# Patient Record
Sex: Female | Born: 1950 | Race: White | Hispanic: No | Marital: Single | Smoking: Former smoker
Health system: Southern US, Community
[De-identification: ages and names within clinical notes are randomized; demographics above are authoritative.]

## PROBLEM LIST (undated history)

## (undated) ENCOUNTER — Emergency Department (HOSPITAL_COMMUNITY): Discharge status: Against Medical Advice | Payer: 59

## (undated) DIAGNOSIS — N6019 Diffuse cystic mastopathy of unspecified breast: Secondary | ICD-10-CM

## (undated) DIAGNOSIS — F419 Anxiety disorder, unspecified: Secondary | ICD-10-CM

## (undated) DIAGNOSIS — M8580 Other specified disorders of bone density and structure, unspecified site: Secondary | ICD-10-CM

## (undated) DIAGNOSIS — D219 Benign neoplasm of connective and other soft tissue, unspecified: Secondary | ICD-10-CM

## (undated) DIAGNOSIS — E7800 Pure hypercholesterolemia, unspecified: Secondary | ICD-10-CM

## (undated) DIAGNOSIS — E039 Hypothyroidism, unspecified: Secondary | ICD-10-CM

## (undated) DIAGNOSIS — K219 Gastro-esophageal reflux disease without esophagitis: Secondary | ICD-10-CM

## (undated) DIAGNOSIS — K449 Diaphragmatic hernia without obstruction or gangrene: Secondary | ICD-10-CM

## (undated) DIAGNOSIS — N809 Endometriosis, unspecified: Secondary | ICD-10-CM

## (undated) DIAGNOSIS — N3941 Urge incontinence: Secondary | ICD-10-CM

## (undated) DIAGNOSIS — I25810 Atherosclerosis of coronary artery bypass graft(s) without angina pectoris: Secondary | ICD-10-CM

## (undated) DIAGNOSIS — Z8709 Personal history of other diseases of the respiratory system: Secondary | ICD-10-CM

## (undated) HISTORY — DX: Diffuse cystic mastopathy of unspecified breast: N60.19

## (undated) HISTORY — DX: Pure hypercholesterolemia, unspecified: E78.00

## (undated) HISTORY — PX: ABDOMINAL HYSTERECTOMY: SHX81

## (undated) HISTORY — DX: Atherosclerosis of coronary artery bypass graft(s) without angina pectoris: I25.810

## (undated) HISTORY — PX: COLONOSCOPY: SHX174

## (undated) HISTORY — DX: Benign neoplasm of connective and other soft tissue, unspecified: D21.9

## (undated) HISTORY — DX: Hypothyroidism, unspecified: E03.9

## (undated) HISTORY — DX: Other specified disorders of bone density and structure, unspecified site: M85.80

## (undated) HISTORY — DX: Endometriosis, unspecified: N80.9

## (undated) HISTORY — DX: Diaphragmatic hernia without obstruction or gangrene: K44.9

## (undated) HISTORY — DX: Urge incontinence: N39.41

---

## 1976-09-26 HISTORY — PX: WISDOM TOOTH EXTRACTION: SHX21

## 1978-09-26 HISTORY — PX: BREAST BIOPSY: SHX20

## 1990-09-26 HISTORY — PX: FOOT SURGERY: SHX648

## 2002-09-26 HISTORY — PX: BLADDER SUSPENSION: SHX72

## 2002-09-26 HISTORY — PX: COMBINED HYSTERECTOMY VAGINAL / OOPHORECTOMY / A&P REPAIR: SUR294

## 2005-09-26 DIAGNOSIS — E7800 Pure hypercholesterolemia, unspecified: Secondary | ICD-10-CM

## 2005-09-26 HISTORY — DX: Pure hypercholesterolemia, unspecified: E78.00

## 2012-08-05 ENCOUNTER — Encounter (HOSPITAL_COMMUNITY): Payer: Self-pay | Admitting: *Deleted

## 2012-08-05 ENCOUNTER — Emergency Department (HOSPITAL_COMMUNITY)
Admission: EM | Admit: 2012-08-05 | Discharge: 2012-08-05 | Disposition: A | Discharge status: Home / Self Care | Payer: 59 | Source: Home / Self Care | Attending: Emergency Medicine | Admitting: Emergency Medicine

## 2012-08-05 ENCOUNTER — Emergency Department (INDEPENDENT_AMBULATORY_CARE_PROVIDER_SITE_OTHER): Payer: 59

## 2012-08-05 DIAGNOSIS — S92309A Fracture of unspecified metatarsal bone(s), unspecified foot, initial encounter for closed fracture: Secondary | ICD-10-CM

## 2012-08-05 DIAGNOSIS — S92301A Fracture of unspecified metatarsal bone(s), right foot, initial encounter for closed fracture: Secondary | ICD-10-CM

## 2012-08-05 MED ORDER — HYDROCODONE-IBUPROFEN 7.5-200 MG PO TABS: 1.0000 | ORAL_TABLET | Freq: Three times a day (TID) | ORAL | Status: DC | PRN

## 2012-08-05 NOTE — ED Provider Notes (Signed)
History     CSN: [id number]  Arrival date & time 08/05/12  1627   First MD Initiated Contact with Patient 08/05/12 1640      Chief Complaint  Patient presents with  . Foot Injury    (Consider location/radiation/quality/duration/timing/severity/associated sxs/prior treatment) HPI Comments: Pt reports stepping off the sidewalk and losing footing, injuring right foot around 1145 this morning, hurts to walk on her right foot. There is an area of swelling in the lateral aspect of it. Patient denies any weakness, numbness, tingling sensation. She describes a conversion of her foot as she was walking on the sidewalk.    Patient is a 61 y.o. female presenting with foot injury. The history is provided by the patient.  Foot Injury  The incident occurred 1 to 2 hours ago. The incident occurred at home. The injury mechanism was torsion. The pain is present in the right foot. The pain is moderate. The pain has been constant since onset. Associated symptoms include inability to bear weight. Pertinent negatives include no numbness, no loss of motion, no muscle weakness, no loss of sensation and no tingling.    Past Medical History  Diagnosis Date  . Depression     Past Surgical History  Procedure Date  . Foot surgery   . Abdominal hysterectomy     Family History  Problem Relation Age of Onset  . Family history unknown: Yes    History  Substance Use Topics  . Smoking status: Never Smoker   . Smokeless tobacco: Not on file  . Alcohol Use: Yes     Comment: occasinal    OB History    Grav Para Term Preterm Abortions TAB SAB Ect Mult Living                  Review of Systems  Constitutional: Negative for fever, activity change and appetite change.  Musculoskeletal: Positive for joint swelling.  Neurological: Negative for tingling, weakness and numbness.    Allergies  Cephalosporins  Home Medications   Current Outpatient Rx  Name  Route  Sig  Dispense  Refill  .  FLUOXETINE HCL 10 MG PO CAPS   Oral   Take by mouth daily.         Marland Kitchen HYDROCODONE-IBUPROFEN 7.5-200 MG PO TABS   Oral   Take 1 tablet by mouth every 8 (eight) hours as needed for pain.   20 tablet   0     BP 137/83  Pulse 84  Temp 98.1 F (36.7 C) (Oral)  Resp 17  SpO2 98%  Physical Exam  Constitutional: She appears well-developed and well-nourished.  Musculoskeletal: She exhibits tenderness.       Feet:  Neurological: She is alert.  Skin: No rash noted. No erythema.    ED Course  Procedures (including critical care time)  Labs Reviewed - No data to display Dg Foot Complete Right  08/05/2012  *RADIOLOGY REPORT*  Clinical Data: Fall  RIGHT FOOT COMPLETE - 3+ VIEW  Comparison: None.  Findings: Three views of the right foot submitted.  There is a nondisplaced fracture at the base of the right fifth metatarsal.  IMPRESSION: Fracture at the base of the right fifth metatarsal.   Original Report Authenticated By: Natasha Mead, M.D.      1. Fracture of metatarsal bone of right foot       MDM   Nondisplaced fracture of the base of the right fifth metatarsal. Patient was placed in a Cam Walker and provided crutches  to avoid weight-bearing activities or 7 days until seen by the orthopedic Dr. Have also provided a prescription of Vicoprofen and a work note with restrictions for 7 days. No neural vascular deficits on exam. Uncomplicated fifth metarsal fracture.       Jimmie Molly, MD 08/05/12 [phone]

## 2012-08-05 NOTE — ED Notes (Signed)
Pt reports stepping off the sidewalk and losing footing, injuring right foot around 1145 this morning

## 2012-08-30 ENCOUNTER — Ambulatory Visit
Admission: RE | Admit: 2012-08-30 | Discharge: 2012-08-30 | Disposition: A | Discharge status: Home / Self Care | Payer: 59 | Source: Ambulatory Visit | Attending: Internal Medicine | Admitting: Internal Medicine

## 2012-08-30 ENCOUNTER — Other Ambulatory Visit: Payer: Self-pay | Admitting: Internal Medicine

## 2012-08-30 DIAGNOSIS — R0602 Shortness of breath: Secondary | ICD-10-CM

## 2013-03-20 ENCOUNTER — Emergency Department (HOSPITAL_COMMUNITY)
Admission: EM | Admit: 2013-03-20 | Discharge: 2013-03-20 | Disposition: A | Discharge status: Home / Self Care | Payer: BC Managed Care – PPO | Source: Home / Self Care | Attending: Family Medicine | Admitting: Family Medicine

## 2013-03-20 ENCOUNTER — Encounter (HOSPITAL_COMMUNITY): Payer: Self-pay | Admitting: Emergency Medicine

## 2013-03-20 DIAGNOSIS — M5380 Other specified dorsopathies, site unspecified: Secondary | ICD-10-CM

## 2013-03-20 DIAGNOSIS — M62830 Muscle spasm of back: Secondary | ICD-10-CM

## 2013-03-20 DIAGNOSIS — M545 Low back pain: Secondary | ICD-10-CM

## 2013-03-20 MED ORDER — METHYLPREDNISOLONE ACETATE 80 MG/ML IJ SUSP
INTRAMUSCULAR | Status: AC
  Filled 2013-03-20: qty 1

## 2013-03-20 MED ORDER — KETOROLAC TROMETHAMINE 60 MG/2ML IM SOLN
INTRAMUSCULAR | Status: AC
  Filled 2013-03-20: qty 2

## 2013-03-20 MED ORDER — KETOROLAC TROMETHAMINE 60 MG/2ML IM SOLN
60.0000 mg | Freq: Once | INTRAMUSCULAR | Status: AC
  Administered 2013-03-20: 60 mg via INTRAMUSCULAR

## 2013-03-20 MED ORDER — CYCLOBENZAPRINE HCL 10 MG PO TABS: 10.0000 mg | ORAL_TABLET | Freq: Three times a day (TID) | ORAL | Status: DC | PRN

## 2013-03-20 MED ORDER — NAPROXEN 500 MG PO TABS: 500.0000 mg | ORAL_TABLET | Freq: Two times a day (BID) | ORAL | Status: DC

## 2013-03-20 MED ORDER — METHYLPREDNISOLONE ACETATE 40 MG/ML IJ SUSP
80.0000 mg | Freq: Once | INTRAMUSCULAR | Status: AC
  Administered 2013-03-20: 80 mg via INTRAMUSCULAR

## 2013-03-20 NOTE — ED Notes (Signed)
Reports back pain since Monday evening.  Reports she moved the wrong way playing with grandchildren.  Throughout the evening pain increased, no numbness or tingling.  Patient reports going from sitting to /from standing , pain is intense.  Using steps is very painful

## 2013-03-20 NOTE — ED Notes (Signed)
Instructed to place on gown 

## 2013-03-20 NOTE — ED Provider Notes (Signed)
History    CSN: [id number] Arrival date & time 03/20/13  1027  First MD Initiated Contact with Patient 03/20/13 1043     Chief Complaint  Patient presents with  . Back Pain   (Consider location/radiation/quality/duration/timing/severity/associated sxs/prior Treatment) HPI Comments: 62 year old female presents complaining of bilateral lower back pain that started on Monday 2 days ago. She was playing with her grandchildren when she felt something "catch" in her back. Since then she has pain across her lower back, worse on the right, that goes slightly down her right leg. The pain gets worse with prolonged sitting and a car, change in position, or any bending at the waist. She denies any numbness  or paresthesias into the groin or legs. There is no loss of bowel control. She does have a chronic urge incontinence that has gotten slightly worse since this happened but she attributes this to taking longer to set down onto the toilet as well as having the back "catch" when she tries to sit down which can sometimes cause her to lose control of her bladder. The pain has been getting better somewhat with a hot shower, walking, and lying flat. She denies any other symptoms apart from the back pain. There is no fever, chills, headache, blurry vision, or rash. She does have a history of mild infrequent lower back pain.  Past Medical History  Diagnosis Date  . Depression    Past Surgical History  Procedure Laterality Date  . Foot surgery    . Abdominal hysterectomy     No family history on file. History  Substance Use Topics  . Smoking status: Never Smoker   . Smokeless tobacco: Not on file  . Alcohol Use: Yes     Comment: occasinal   OB History   Grav Para Term Preterm Abortions TAB SAB Ect Mult Living                 Review of Systems  Constitutional: Negative for fever and chills.  Eyes: Negative for visual disturbance.  Respiratory: Negative for cough and shortness of breath.    Cardiovascular: Negative for chest pain, palpitations and leg swelling.  Gastrointestinal: Negative for nausea, vomiting and abdominal pain.  Endocrine: Negative for polydipsia and polyuria.  Genitourinary: Positive for urgency (chronic). Negative for dysuria and frequency.  Musculoskeletal: Positive for back pain. Negative for myalgias and arthralgias.  Skin: Negative for rash.  Neurological: Negative for dizziness, weakness and light-headedness.    Allergies  Cephalosporins  Home Medications   Current Outpatient Rx  Name  Route  Sig  Dispense  Refill  . acetaminophen (TYLENOL) 325 MG tablet   Oral   Take 650 mg by mouth every 6 (six) hours as needed for pain.         Marland Kitchen estradiol (VIVELLE-DOT) 0.1 MG/24HR   Transdermal   Place 1 patch onto the skin 2 (two) times a week.         . Naproxen Sodium (ALEVE PO)   Oral   Take by mouth.         . cyclobenzaprine (FLEXERIL) 10 MG tablet   Oral   Take 1 tablet (10 mg total) by mouth 3 (three) times daily as needed for muscle spasms.   30 tablet   0     Dispense as written.   Marland Kitchen FLUoxetine (PROZAC) 10 MG capsule   Oral   Take by mouth daily.         Marland Kitchen HYDROcodone-ibuprofen (VICOPROFEN) 7.5-200 MG per  tablet   Oral   Take 1 tablet by mouth every 8 (eight) hours as needed for pain.   20 tablet   0   . naproxen (NAPROSYN) 500 MG tablet   Oral   Take 1 tablet (500 mg total) by mouth 2 (two) times daily.   60 tablet   0    BP 127/81  Pulse 72  Temp(Src) 98.2 F (36.8 C) (Oral)  Resp 16  SpO2 100% Physical Exam  Nursing note and vitals reviewed. Constitutional: She is oriented to person, place, and time. Vital signs are normal. She appears well-developed and well-nourished. No distress.  HENT:  Head: Atraumatic.  Eyes: EOM are normal. Pupils are equal, round, and reactive to light.  Cardiovascular: Normal rate, regular rhythm and normal heart sounds.  Exam reveals no gallop and no friction rub.   No murmur  heard. Pulmonary/Chest: Effort normal and breath sounds normal. No respiratory distress. She has no wheezes. She has no rales.  Abdominal: Soft. There is no tenderness.  Musculoskeletal:       Lumbar back: She exhibits decreased range of motion, tenderness, pain and spasm. She exhibits no bony tenderness, no edema and no deformity.  Palpation of the lower lumbar spinous processes causes some mild pain to radiate through the right buttock. The paraspinous musculature is very tight and only mildly tender. Overall, palpation of the lumbar spine and paraspinous musculature does not  increase the pain to the level that she feels when it "catches"  Neurological: She is alert and oriented to person, place, and time. She has normal strength.  Skin: Skin is warm and dry. She is not diaphoretic.  Psychiatric: She has a normal mood and affect. Her behavior is normal. Judgment normal.    ED Course  Procedures (including critical care time) Labs Reviewed - No data to display No results found. 1. Back spasm   2. Lumbago     MDM  The history and physical exam are appropriate for a back spasm. I've strongly expresses patient that if she does start to develop any loss of control of her bowels or bladder, she will return immediately to the emergency department. Otherwise, she will use the prescribed medications as well as staying active and moist heat. She will return if this does not improve.    Meds ordered this encounter  Medications                       . methylPREDNISolone acetate (DEPO-MEDROL) injection 80 mg    Sig:   . ketorolac (TORADOL) injection 60 mg    Sig:   . naproxen (NAPROSYN) 500 MG tablet    Sig: Take 1 tablet (500 mg total) by mouth 2 (two) times daily.    Dispense:  60 tablet    Refill:  0  . cyclobenzaprine (FLEXERIL) 10 MG tablet    Sig: Take 1 tablet (10 mg total) by mouth 3 (three) times daily as needed for muscle spasms.    Dispense:  30 tablet    Refill:  0      Graylon Good, PA-C 03/20/13 1145

## 2013-03-20 NOTE — ED Provider Notes (Signed)
Medical screening examination/treatment/procedure(s) were performed by non-physician practitioner and as supervising physician I was immediately available for consultation/collaboration.   [facility]; MD  Sharin Grave, MD 03/20/13 [phone]

## 2013-04-16 ENCOUNTER — Institutional Professional Consult (permissible substitution): Payer: Self-pay | Admitting: Medical

## 2013-06-05 ENCOUNTER — Ambulatory Visit (INDEPENDENT_AMBULATORY_CARE_PROVIDER_SITE_OTHER): Payer: BC Managed Care – PPO | Admitting: Gynecology

## 2013-06-05 ENCOUNTER — Encounter: Payer: Self-pay | Admitting: Gynecology

## 2013-06-05 VITALS — BP 124/74 | HR 62 | Resp 14 | Ht 64.0 in | Wt 148.0 lb

## 2013-06-05 DIAGNOSIS — Z780 Asymptomatic menopausal state: Secondary | ICD-10-CM | POA: Insufficient documentation

## 2013-06-05 DIAGNOSIS — Z79890 Hormone replacement therapy (postmenopausal): Secondary | ICD-10-CM

## 2013-06-05 DIAGNOSIS — N3941 Urge incontinence: Secondary | ICD-10-CM

## 2013-06-05 DIAGNOSIS — Z01419 Encounter for gynecological examination (general) (routine) without abnormal findings: Secondary | ICD-10-CM

## 2013-06-05 DIAGNOSIS — R159 Full incontinence of feces: Secondary | ICD-10-CM

## 2013-06-05 HISTORY — DX: Urge incontinence: N39.41

## 2013-06-05 HISTORY — DX: Asymptomatic menopausal state: Z78.0

## 2013-06-05 MED ORDER — FESOTERODINE FUMARATE ER 4 MG PO TB24: 4.0000 mg | ORAL_TABLET | Freq: Every day | ORAL | Status: DC

## 2013-06-05 MED ORDER — ESTRADIOL 0.1 MG/24HR TD PTTW: 1.0000 | MEDICATED_PATCH | TRANSDERMAL | Status: DC

## 2013-06-05 MED ORDER — FLUOXETINE HCL 40 MG PO CAPS: 40.0000 mg | ORAL_CAPSULE | Freq: Every day | ORAL | Status: DC

## 2013-06-05 NOTE — Patient Instructions (Addendum)

## 2013-06-05 NOTE — Progress Notes (Signed)
62 y.o. Single G5P2L2 Caucasian female   No obstetric history on file. here for annual exam. Pt reports menses are absent.  She does report hot flashes, does have night sweats, does have vaginal dryness.  She is not using lubricants.  She does not report post-menopasual bleeding.  Pt had a sling done with her TVH in 2004, pt reports initially she was continent, but now has been leaking urine over the last 71m, mostly described as urgency-will loose variable volumes of urine.  Pt drinks carbonated beverages-3-4 cokes/d but no coffee or tea.  Pt also reports occasional loss of stool with valsalva and sometimes no sensation at all, small amounts, formed stool not diarrhea.  Patient's last menstrual period was 09/27/2003.          Sexually active: no  The current method of family planning is status post hysterectomy.    Exercising: yes  walking 2x/wk Last pap: 2012/2013- not sure if pap was done Abnormal PAP: no Mammogram: March/April  2013- Normal BSE: no Colonoscopy: August 2007/2008-  2 polyps  DEXA: cannot remember  Alcohol: 1-2 drinks/wk Tobacco: no  Hgb: PCP ; Urine: CP   No health maintenance topics applied.  Family History  Problem Relation Age of Onset  . Thyroid disease Maternal Grandmother     Goiter  . Cancer Paternal Grandmother     Colon   . Cancer Paternal Grandfather     Lung  . Thyroid disease Daughter     Hypothyroid    There are no active problems to display for this patient.   Past Medical History  Diagnosis Date  . Endometriosis   . Fibroid     Past Surgical History  Procedure Laterality Date  . Foot surgery    . Abdominal hysterectomy  2005  . Wisdom tooth extraction  1978    Allergies: Cephalosporins  Current Outpatient Prescriptions  Medication Sig Dispense Refill  . estradiol (VIVELLE-DOT) 0.1 MG/24HR Place 1 patch onto the skin 2 (two) times a week.      Marland Kitchen FLUoxetine (PROZAC) 10 MG capsule Take 20 mg by mouth daily.        No current  facility-administered medications for this visit.    ROS: Pertinent items are noted in HPI.  Exam:    BP 124/74  Pulse 62  Resp 14  Ht 5\' 4"  (1.626 m)  Wt 148 lb (67.132 kg)  BMI 25.39 kg/m2  LMP 09/27/2003 Weight change: @WEIGHTCHANGE @ Last 3 height recordings:  Ht Readings from Last 3 Encounters:  06/05/13 5\' 4"  (1.626 m)   General appearance: alert, cooperative and appears stated age Head: Normocephalic, without obvious abnormality, atraumatic Neck: no adenopathy, no carotid bruit, no JVD, supple, symmetrical, trachea midline and thyroid not enlarged, symmetric, no tenderness/mass/nodules Lungs: clear to auscultation bilaterally Breasts: normal appearance, no masses or tenderness Heart: regular rate and rhythm, S1, S2 normal, no murmur, click, rub or gallop Abdomen: soft, non-tender; bowel sounds normal; no masses,  no organomegaly Extremities: extremities normal, atraumatic, no cyanosis or edema Skin: Skin color, texture, turgor normal. No rashes or lesions Lymph nodes: Cervical, supraclavicular, and axillary nodes normal. no inguinal nodes palpated Neurologic: Grossly normal   Pelvic: External genitalia:  no lesions              Urethra: normal appearing urethra with no masses, tenderness or lesions              Bartholins and Skenes: Bartholin's, Urethra, Skene's normal  Vagina: normal appearing vagina with normal color and discharge, no lesions, well supported, sling not palpated, split speculum exam reveals a high cystocele with min descent, poor rectovaginal tone              Cervix: absent              Pap taken: no        Bimanual Exam:  Uterus:  absent                                      Adnexa:    no masses                                      Rectovaginal: rectocele, thinned                                       Anus:  normal sphincter tone, no lesions  A: well woman Urge incontinence Fecal incontinence HRT use Fibrocystic breast    P:  mammogram annual, pt to provide records to mammogram center, no evidence of fibrocystic disease Pt with medical records from last gyn on CD will review at later date  Will refer to GI for fecal incontinence with good rectal  Start on toviaz PAP guidelines reviewed, no PAP done today,  return annually or prn Discussed PAP guideline changes, importance of weight bearing exercises, calcium, vit D and balanced diet.  An After Visit Summary was printed and given to the patient.

## 2013-06-18 ENCOUNTER — Institutional Professional Consult (permissible substitution): Payer: Self-pay | Admitting: Medical

## 2013-07-02 ENCOUNTER — Encounter: Payer: Self-pay | Admitting: Medical

## 2013-07-02 ENCOUNTER — Ambulatory Visit (INDEPENDENT_AMBULATORY_CARE_PROVIDER_SITE_OTHER): Payer: BC Managed Care – PPO | Admitting: Medical

## 2013-07-02 VITALS — BP 110/70 | HR 68 | Temp 98.0°F | Resp 16 | Wt 145.0 lb

## 2013-07-02 DIAGNOSIS — J069 Acute upper respiratory infection, unspecified: Secondary | ICD-10-CM

## 2013-07-02 DIAGNOSIS — E7800 Pure hypercholesterolemia, unspecified: Secondary | ICD-10-CM

## 2013-07-02 MED ORDER — AZITHROMYCIN 250 MG PO TABS: ORAL_TABLET | ORAL | Status: DC

## 2013-07-02 NOTE — Progress Notes (Signed)
Subjective:  Marissa Lee is a 62 y.o. female who presents to establish care.  Here as a new patient today.  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  Been renting a house in [place], but still has house in [place].  She notes hx/o taking Lipitor and crestor, but had muscle aches with each.   Has questions about lipids, labs she brought in.  Was told by gynecology recently that she didn't need to be on lipid medication.     Been having some URI symptoms.  Started about a week or more ago.  She reports cough, runny nose, congestion in chest, sore throat, low grade fever, some head congestion, but worse in chest.  Using nothing for symptoms, but not getting any better either.  Denies sick contacts.  No other aggravating or relieving factors.  No other c/o.  ROS as in subjective.   Objective: Filed Vitals:   07/02/13 1051  BP: 110/70  Pulse: 68  Temp: 98 F (36.7 C)  Resp: 16    General appearance: Alert, WD/WN, no distress, wet cough noted                             Skin: warm, no rash                           Head: no sinus tenderness                            Eyes: conjunctiva normal, corneas clear, PERRLA                            Ears: pearly TMs, external ear canals normal                          Nose: septum midline, turbinates swollen, with erythema and clear discharge             Mouth/throat: MMM, tongue normal, mild pharyngeal erythema                           Neck: supple, no adenopathy, no thyromegaly, nontender                          Heart: RRR, normal S1, S2, no murmurs                         Lungs: CTA bilaterally, no wheezes, rales, or rhonchi     Assessment: Encounter Diagnoses  Name Primary?  Marland Kitchen Upper respiratory infection Yes  . Pure hypercholesterolemia     Plan: URI - Discussed diagnosis and treatment of URI.  Suggested symptomatic OTC remedies.  Nasal saline spray for congestion.  Tylenol or Ibuprofen OTC for fever and malaise.  If not improving in 3-4 days,  begin Zpak antibiotic.  Call/return if symptoms aren't resolving.   Hyperlipidemia- I reviewed her recent labs showing total cholesterol 290, HDL of 80, LDL of 185. Her only risk factor is age approaching 20 female. Even so, we discussed strategies to lower her total and LDL cholesterol. She currently does not use diet restriction, we discussed avoiding high cholesterol foods, and I gave her examples of foods to avoid, and also discussed foods to  use to help with cholesterol. Advised to begin 81 mg aspirin daily. We discussed the risk and benefits of statin drugs. I recommended she return at her convenience for an NMR lipo profile to further stratify her risk and molecular profile.  Recommend she exercise regularly  Follow up in 3 months

## 2013-07-02 NOTE — Patient Instructions (Signed)
Hypertriglyceridemia  Diet for High blood levels of Triglycerides Most fats in food are triglycerides. Triglycerides in your blood are stored as fat in your body. High levels of triglycerides in your blood may put you at a greater risk for heart disease and stroke.  Normal triglyceride levels are less than 150 mg/dL. Borderline high levels are 150-199 mg/dl. High levels are 200 - 499 mg/dL, and very high triglyceride levels are greater than 500 mg/dL. The decision to treat high triglycerides is generally based on the level. For people with borderline or high triglyceride levels, treatment includes weight loss and exercise. Drugs are recommended for people with very high triglyceride levels. Many people who need treatment for high triglyceride levels have metabolic syndrome. This syndrome is a collection of disorders that often include: insulin resistance, high blood pressure, blood clotting problems, high cholesterol and triglycerides. TESTING PROCEDURE FOR TRIGLYCERIDES  You should not eat 4 hours before getting your triglycerides measured. The normal range of triglycerides is between 10 and 250 milligrams per deciliter (mg/dl). Some people may have extreme levels (1000 or above), but your triglyceride level may be too high if it is above 150 mg/dl, depending on what other risk factors you have for heart disease.  People with high blood triglycerides may also have high blood cholesterol levels. If you have high blood cholesterol as well as high blood triglycerides, your risk for heart disease is probably greater than if you only had high triglycerides. High blood cholesterol is one of the main risk factors for heart disease. CHANGING YOUR DIET  Your weight can affect your blood triglyceride level. If you are more than 20% above your ideal body weight, you may be able to lower your blood triglycerides by losing weight. Eating less and exercising regularly is the best way to combat this. Fat provides more  calories than any other food. The best way to lose weight is to eat less fat. Only 30% of your total calories should come from fat. Less than 7% of your diet should come from saturated fat. A diet low in fat and saturated fat is the same as a diet to decrease blood cholesterol. By eating a diet lower in fat, you may lose weight, lower your blood cholesterol, and lower your blood triglyceride level.  Eating a diet low in fat, especially saturated fat, may also help you lower your blood triglyceride level. Ask your dietitian to help you figure how much fat you can eat based on the number of calories your caregiver has prescribed for you.  Exercise, in addition to helping with weight loss may also help lower triglyceride levels.   Alcohol can increase blood triglycerides. You may need to stop drinking alcoholic beverages.  Too much carbohydrate in your diet may also increase your blood triglycerides. Some complex carbohydrates are necessary in your diet. These may include bread, rice, potatoes, other starchy vegetables and cereals.  Reduce "simple" carbohydrates. These may include pure sugars, candy, honey, and jelly without losing other nutrients. If you have the kind of high blood triglycerides that is affected by the amount of carbohydrates in your diet, you will need to eat less sugar and less high-sugar foods. Your caregiver can help you with this.  Adding 2-4 grams of fish oil (EPA+ DHA) may also help lower triglycerides. Speak with your caregiver before adding any supplements to your regimen. Following the Diet  Maintain your ideal weight. Your caregivers can help you with a diet. Generally, eating less food and getting more   exercise will help you lose weight. Joining a weight control group may also help. Ask your caregivers for a good weight control group in your area.  Eat low-fat foods instead of high-fat foods. This can help you lose weight too.  These foods are lower in fat. Eat MORE of these:    Dried beans, peas, and lentils.  Egg whites.  Low-fat cottage cheese.  Fish.  Lean cuts of meat, such as round, sirloin, rump, and flank (cut extra fat off meat you fix).  Whole grain breads, cereals and pasta.  Skim and nonfat dry milk.  Low-fat yogurt.  Poultry without the skin.  Cheese made with skim or part-skim milk, such as mozzarella, parmesan, farmers', ricotta, or pot cheese. These are higher fat foods. Eat LESS of these:   Whole milk and foods made from whole milk, such as American, blue, cheddar, monterey jack, and swiss cheese  High-fat meats, such as luncheon meats, sausages, knockwurst, bratwurst, hot dogs, ribs, corned beef, ground pork, and regular ground beef.  Fried foods. Limit saturated fats in your diet. Substituting unsaturated fat for saturated fat may decrease your blood triglyceride level. You will need to read package labels to know which products contain saturated fats.  These foods are high in saturated fat. Eat LESS of these:   Fried pork skins.  Whole milk.  Skin and fat from poultry.  Palm oil.  Butter.  Shortening.  Cream cheese.  Bacon.  Margarines and baked goods made from listed oils.  Vegetable shortenings.  Chitterlings.  Fat from meats.  Coconut oil.  Palm kernel oil.  Lard.  Cream.  Sour cream.  Fatback.  Coffee whiteners and non-dairy creamers made with these oils.  Cheese made from whole milk. Use unsaturated fats (both polyunsaturated and monounsaturated) moderately. Remember, even though unsaturated fats are better than saturated fats; you still want a diet low in total fat.  These foods are high in unsaturated fat:   Canola oil.  Sunflower oil.  Mayonnaise.  Almonds.  Peanuts.  Pine nuts.  Margarines made with these oils.  Safflower oil.  Olive oil.  Avocados.  Cashews.  Peanut butter.  Sunflower seeds.  Soybean oil.  Peanut  oil.  Olives.  Pecans.  Walnuts.  Pumpkin seeds. Avoid sugar and other high-sugar foods. This will decrease carbohydrates without decreasing other nutrients. Sugar in your food goes rapidly to your blood. When there is excess sugar in your blood, your liver may use it to make more triglycerides. Sugar also contains calories without other important nutrients.  Eat LESS of these:   Sugar, brown sugar, powdered sugar, jam, jelly, preserves, honey, syrup, molasses, pies, candy, cakes, cookies, frosting, pastries, colas, soft drinks, punches, fruit drinks, and regular gelatin.  Avoid alcohol. Alcohol, even more than sugar, may increase blood triglycerides. In addition, alcohol is high in calories and low in nutrients. Ask for sparkling water, or a diet soft drink instead of an alcoholic beverage. Suggestions for planning and preparing meals   Bake, broil, grill or roast meats instead of frying.  Remove fat from meats and skin from poultry before cooking.  Add spices, herbs, lemon juice or vinegar to vegetables instead of salt, rich sauces or gravies.  Use a non-stick skillet without fat or use no-stick sprays.  Cool and refrigerate stews and broth. Then remove the hardened fat floating on the surface before serving.  Refrigerate meat drippings and skim off fat to make low-fat gravies.  Serve more fish.  Use less butter,   margarine and other high-fat spreads on bread or vegetables.  Use skim or reconstituted non-fat dry milk for cooking.  Cook with low-fat cheeses.  Substitute low-fat yogurt or cottage cheese for all or part of the sour cream in recipes for sauces, dips or congealed salads.  Use half yogurt/half mayonnaise in salad recipes.  Substitute evaporated skim milk for cream. Evaporated skim milk or reconstituted non-fat dry milk can be whipped and substituted for whipped cream in certain recipes.  Choose fresh fruits for dessert instead of high-fat foods such as pies or  cakes. Fruits are naturally low in fat. When Dining Out   Order low-fat appetizers such as fruit or vegetable juice, pasta with vegetables or tomato sauce.  Select clear, rather than cream soups.  Ask that dressings and gravies be served on the side. Then use less of them.  Order foods that are baked, broiled, poached, steamed, stir-fried, or roasted.  Ask for margarine instead of butter, and use only a small amount.  Drink sparkling water, unsweetened tea or coffee, or diet soft drinks instead of alcohol or other sweet beverages. QUESTIONS AND ANSWERS ABOUT OTHER FATS IN THE BLOOD: SATURATED FAT, TRANS FAT, AND CHOLESTEROL What is trans fat? Trans fat is a type of fat that is formed when vegetable oil is hardened through a process called hydrogenation. This process helps makes foods more solid, gives them shape, and prolongs their shelf life. Trans fats are also called hydrogenated or partially hydrogenated oils.  What do saturated fat, trans fat, and cholesterol in foods have to do with heart disease? Saturated fat, trans fat, and cholesterol in the diet all raise the level of LDL "bad" cholesterol in the blood. The higher the LDL cholesterol, the greater the risk for coronary heart disease (CHD). Saturated fat and trans fat raise LDL similarly.  What foods contain saturated fat, trans fat, and cholesterol? High amounts of saturated fat are found in animal products, such as fatty cuts of meat, chicken skin, and full-fat dairy products like butter, whole milk, cream, and cheese, and in tropical vegetable oils such as palm, palm kernel, and coconut oil. Trans fat is found in some of the same foods as saturated fat, such as vegetable shortening, some margarines (especially hard or stick margarine), crackers, cookies, baked goods, fried foods, salad dressings, and other processed foods made with partially hydrogenated vegetable oils. Small amounts of trans fat also occur naturally in some animal  products, such as milk products, beef, and lamb. Foods high in cholesterol include liver, other organ meats, egg yolks, shrimp, and full-fat dairy products. How can I use the new food label to make heart-healthy food choices? Check the Nutrition Facts panel of the food label. Choose foods lower in saturated fat, trans fat, and cholesterol. For saturated fat and cholesterol, you can also use the Percent Daily Value (%DV): 5% DV or less is low, and 20% DV or more is high. (There is no %DV for trans fat.) Use the Nutrition Facts panel to choose foods low in saturated fat and cholesterol, and if the trans fat is not listed, read the ingredients and limit products that list shortening or hydrogenated or partially hydrogenated vegetable oil, which tend to be high in trans fat. POINTS TO REMEMBER:   Discuss your risk for heart disease with your caregivers, and take steps to reduce risk factors.  Change your diet. Choose foods that are low in saturated fat, trans fat, and cholesterol.  Add exercise to your daily routine if   it is not already being done. Participate in physical activity of moderate intensity, like brisk walking, for at least 30 minutes on most, and preferably all days of the week. No time? Break the 30 minutes into three, 10-minute segments during the day.  Stop smoking. If you do smoke, contact your caregiver to discuss ways in which they can help you quit.  Do not use street drugs.  Maintain a normal weight.  Maintain a healthy blood pressure.  Keep up with your blood work for checking the fats in your blood as directed by your caregiver. Document Released: 06/30/2004 Document Revised: 03/13/2012 Document Reviewed: 01/26/2009 [employer] Patient Information 2014 [employer].  

## 2013-07-29 ENCOUNTER — Other Ambulatory Visit: Payer: Self-pay

## 2013-07-29 DIAGNOSIS — Z1231 Encounter for screening mammogram for malignant neoplasm of breast: Secondary | ICD-10-CM

## 2013-08-09 ENCOUNTER — Ambulatory Visit (HOSPITAL_COMMUNITY)
Admission: RE | Admit: 2013-08-09 | Discharge: 2013-08-09 | Disposition: A | Discharge status: Home / Self Care | Payer: BC Managed Care – PPO | Source: Ambulatory Visit | Attending: Medical | Admitting: Medical

## 2013-08-09 ENCOUNTER — Ambulatory Visit (HOSPITAL_COMMUNITY): Payer: BC Managed Care – PPO

## 2013-08-09 DIAGNOSIS — Z1231 Encounter for screening mammogram for malignant neoplasm of breast: Secondary | ICD-10-CM

## 2013-08-30 ENCOUNTER — Ambulatory Visit: Payer: BC Managed Care – PPO

## 2013-09-04 ENCOUNTER — Ambulatory Visit: Payer: BC Managed Care – PPO | Admitting: Medical

## 2013-09-06 ENCOUNTER — Ambulatory Visit (INDEPENDENT_AMBULATORY_CARE_PROVIDER_SITE_OTHER): Payer: BC Managed Care – PPO | Admitting: Gynecology

## 2013-09-06 VITALS — BP 112/60 | HR 68 | Resp 12 | Ht 64.0 in | Wt 145.0 lb

## 2013-09-06 DIAGNOSIS — K5900 Constipation, unspecified: Secondary | ICD-10-CM

## 2013-09-06 DIAGNOSIS — N3941 Urge incontinence: Secondary | ICD-10-CM

## 2013-09-06 DIAGNOSIS — J069 Acute upper respiratory infection, unspecified: Secondary | ICD-10-CM

## 2013-09-06 NOTE — Progress Notes (Signed)
Subjective:     Patient ID: Marissa Lee, female   DOB: 1951/07/30, 62 y.o.   MRN: [id number]  HPI Comments: Pt here for f/u appot after starting on toviaz for urinary frequency.  Pt was seen by GI who diagnosed her issue as fecal fullness and pt has stopped soda, dirning only water.  In addition she is on linzess, miralax and pro-biotic daily and is doing much better!  Pt has no issues with bladder any more!!  Overall is feeling great.    Review of Systems  Respiratory: Positive for cough (since sunday, productive). Negative for chest tightness and shortness of breath.   Gastrointestinal: Negative for nausea, vomiting, abdominal pain, diarrhea, constipation, blood in stool, abdominal distention, anal bleeding and rectal pain.  Genitourinary: Negative for dysuria, frequency, hematuria and difficulty urinating.       Objective:   Physical Exam  Nursing note and vitals reviewed. Constitutional: She is oriented to person, place, and time. She appears well-developed and well-nourished.  Pulmonary/Chest: Effort normal and breath sounds normal. No respiratory distress. She has no wheezes. She has no rales. She exhibits no tenderness.  Lymphadenopathy:    She has no cervical adenopathy.  Neurological: She is alert and oriented to person, place, and time.       Assessment:     URTI-probably viral    Plan:     mucinex with DM pepcid or nexium as directed until no longer coughing Robitussin or delsym as needed Avoid spicy foods

## 2013-09-06 NOTE — Patient Instructions (Signed)
mucinex with DM pepcid or nexium as directed until no longer coughing Robitussin or delsym as needed Avoid spicy foods

## 2013-09-10 ENCOUNTER — Encounter: Payer: Self-pay | Admitting: Medical

## 2013-12-13 ENCOUNTER — Encounter: Payer: Self-pay | Admitting: Medical

## 2013-12-13 ENCOUNTER — Ambulatory Visit (INDEPENDENT_AMBULATORY_CARE_PROVIDER_SITE_OTHER): Payer: BC Managed Care – PPO | Admitting: Medical

## 2013-12-13 VITALS — BP 110/70 | HR 82 | Temp 99.7°F | Resp 14 | Wt 140.0 lb

## 2013-12-13 DIAGNOSIS — R52 Pain, unspecified: Secondary | ICD-10-CM

## 2013-12-13 DIAGNOSIS — J101 Influenza due to other identified influenza virus with other respiratory manifestations: Secondary | ICD-10-CM

## 2013-12-13 DIAGNOSIS — J111 Influenza due to unidentified influenza virus with other respiratory manifestations: Secondary | ICD-10-CM

## 2013-12-13 LAB — POC INFLUENZA A&B (BINAX/QUICKVUE)
INFLUENZA B, POC: POSITIVE
Influenza A, POC: NEGATIVE

## 2013-12-13 MED ORDER — ALBUTEROL SULFATE HFA 108 (90 BASE) MCG/ACT IN AERS: 2.0000 | INHALATION_SPRAY | Freq: Four times a day (QID) | RESPIRATORY_TRACT | Status: DC | PRN

## 2013-12-13 NOTE — Patient Instructions (Addendum)
Recommendations:  You may use ibuprofen or Aleve over-the-counter for fever, body aches, chills  Rest, drink plenty of fluids  You can continue TheraFlu or DayQuil and NyQuil for cough and congestion  If needed over the weekend for tight chest, I did send an albuterol inhaler to your pharmacy  If much worse over the weekend, difficulty breathing, very weak, then go to the urgent care or emergency department  Influenza, Adult Influenza ("the flu") is a viral infection of the respiratory tract. It causes chills, fever, cough, headache, body aches, and sore throat. Influenza in general will make you feel sicker than when you have a common cold. Symptoms of the illness typically last a few days. Cough and fatigue may continue for as long as 7 to 10 days. Influenza is highly contagious. It spreads easily to others in the droplets from coughs and sneezes. People frequently become infected by touching something that was recently contaminated with the virus and then touch their mouth, nose or eyes. This infection is caused by a virus. Symptoms will not be reduced or improved by taking an antibiotic. Antibiotics are medications that kill bacteria, not viruses. DIAGNOSIS  Diagnosis of influenza is often made based on the history and physical examination as well as the presence of influenza reports occurring in your community. Testing can be done if the diagnosis is not certain. TREATMENT  Since influenza is caused by a virus, antibiotics are not helpful. Your caregiver may prescribe antiviral medicines to shorten the illness and lessen the severity. Your caregiver may also recommend influenza vaccination and/or antiviral medicines for your family members in order to prevent the spread of influenza to them. HOME CARE INSTRUCTIONS  DO NOT GIVE ASPIRIN TO PERSONS WITH INFLUENZA WHO ARE UNDER 81 YEARS OF AGE. This could lead to brain and liver damage (Reye's syndrome). Read the label on over-the-counter  medicines.   Stay home from work or school if at all possible until most of your symptoms are gone.   Only take over-the-counter or prescription medicines for pain, discomfort, or fever as directed by your caregiver.   Use a cool mist humidifier to increase air moisture. This will make breathing easier.   Rest until your temperature is nearly normal: 98.6 F (37 C). This usually takes 3 to 4 days. Be sure you get plenty of rest.   Drink at least eight, eight-ounce glasses of fluids per day. Fluids include water, juice, broth, gelatin, or lemonade.   Cover your mouth and nose when coughing or sneezing and wash your hands often to prevent the spread of this virus to other persons.  PREVENTION  Annual influenza vaccination (flu shots) is the best way to avoid getting influenza. An annual flu shot is now routinely recommended for all adults in the [place] IF:   You develop shortness of breath while resting.   You have a deep cough with production of mucous or chest pain.   You develop nausea (feeling sick to your stomach), vomiting, or diarrhea.  SEEK IMMEDIATE MEDICAL CARE IF:   You have difficulty breathing, become short of breath, or your skin or nails turn bluish.   You develop severe neck pain or stiffness.   You develop a severe headache, facial pain, or earache.   You have a fever.   You develop nausea or vomiting that cannot be controlled.  Document Released: 09/09/2000 Document Revised: 05/25/2011 Document Reviewed: 07/15/2009 [facility] Patient Information 2012 [place].

## 2013-12-13 NOTE — Progress Notes (Signed)
Subjective:  Marissa Lee is a 63 y.o. female who presents for cough.  Symptoms include 2.5 days of coughing, body aches, sore throat, not feeling well, nasal congestion, subjective fever, more chills today, headache.  Using Theraflu.  Cough is non productive.  No NVD, no rash, nonsomker.  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  No other aggravating or relieving factors.  No other c/o.  The following portions of the patient's history were reviewed and updated as appropriate: allergies, current medications, past medical history, past social history and problem list.  ROS as in subjective   Past Medical History  Diagnosis Date  . Endometriosis   . Fibroid   . Hypercholesteremia 2007  . Fibrocystic breast     muliple drainage  . Urge incontinence 06/05/2013     Objective: BP 110/70  Pulse 82  Temp(Src) 99.7 F (37.6 C) (Oral)  Resp 14  Wt 140 lb (63.504 kg)  LMP 09/27/2003  General: Ill-appearing, well-developed, well-nourished Skin: warm, dry HEENT: Nose inflamed and congested, clear conjunctiva, TMs pearly, no sinus tenderness, pharynx with erythema, no exudates Neck: Supple, nontender, shotty cervical adenopathy Heart: Regular rate and rhythm, normal S1, S2, no murmurs Lungs: Clear to auscultation bilaterally, no wheezes, rales, rhonchi Abdomen: Nontender non distended Extremities: Mild generalized tenderness   Assessment and Plan: Encounter Diagnoses  Name Primary?  . Influenza B Yes  . Body aches     Discussed diagnosis of influenza.  Discussed supportive care including rest, hydration, OTC Tylenol or NSAID for fever, aches, and malaise. Script for albuterol prn.  Discussed period of contagion, self quarantine at home away from others to avoid spread of disease, discussed means of transmission, and possible complications including pneumonia.  If worse or not improving within the next 4-5 days, then call or return.

## 2014-05-12 ENCOUNTER — Ambulatory Visit (INDEPENDENT_AMBULATORY_CARE_PROVIDER_SITE_OTHER): Payer: BC Managed Care – PPO | Admitting: Certified Nurse Midwife

## 2014-05-12 ENCOUNTER — Encounter: Payer: Self-pay | Admitting: Certified Nurse Midwife

## 2014-05-12 VITALS — BP 118/64 | HR 68 | Resp 16 | Ht 64.0 in | Wt 142.0 lb

## 2014-05-12 DIAGNOSIS — R350 Frequency of micturition: Secondary | ICD-10-CM

## 2014-05-12 DIAGNOSIS — B373 Candidiasis of vulva and vagina: Secondary | ICD-10-CM

## 2014-05-12 DIAGNOSIS — B3731 Acute candidiasis of vulva and vagina: Secondary | ICD-10-CM

## 2014-05-12 LAB — POCT URINALYSIS DIPSTICK
Bilirubin, UA: NEGATIVE
Blood, UA: NEGATIVE
Glucose, UA: NEGATIVE
Ketones, UA: NEGATIVE
LEUKOCYTES UA: NEGATIVE
NITRITE UA: NEGATIVE
PH UA: 5
PROTEIN UA: NEGATIVE
Urobilinogen, UA: NEGATIVE

## 2014-05-12 MED ORDER — TERCONAZOLE 0.4 % VA CREA: 1.0000 | TOPICAL_CREAM | Freq: Every day | VAGINAL | Status: DC

## 2014-05-12 NOTE — Progress Notes (Signed)
62 y.o. single [id number] here with complaint of vaginal symptoms of itching, burning, and increase discharge. Describes discharge as none that she can tell. Onset of symptoms 7 days ago. Denies new personal products or vaginal dryness. Has been swimming a lot recently. No STD concerns. Urinary symptoms occasional frequency, no urgency or pain. Marland Kitchen   O:Healthy female WDWN Affect: normal, orientation x 3  Exam: Abdomen: soft, negative suprapubic Lymph node: no enlargement or tenderness Pelvic exam: External genital:  BUS: negative, bladder and urethral meatus nontender Vagina: scant white vaginal discharge, with red appearance and thinning  noted. Ph: 5.0  ,Wet prep taken Cervix: absent Uterus: absent Adnexa:absent bilateral, no masses or fullness noted   Wet Prep results: Yeast    A:Yeast vaginitis Atrophic vaginitis    P:Discussed findings of yeast vaginitis and etiology. Discussed Aveeno or baking soda sitz bath for comfort. Avoid moist clothes or pads for extended period of time. If working out in gym clothes or swim suits for long periods of time change underwear or bottoms of swimsuit if possible. Olive Oil use for skin protection prior to activity can be used to external skin. Discussed atrophic vaginitis, etiology and options for treatment with OTC at this point. Patient instructed to start with Coconut oil every other night after completing Terazol. Questions addressed. Rx: Terazol 7 vaginal cream see order  Rv 3 weeks

## 2014-05-12 NOTE — Patient Instructions (Signed)
Atrophic Vaginitis Atrophic vaginitis is a problem of low levels of estrogen in women. This problem can happen at any age. It is most common in women who have gone through menopause ("the change").  HOW WILL I KNOW IF I HAVE THIS PROBLEM? You may have:  Trouble with peeing (urinating), such as:  Going to the bathroom often.  A hard time holding your pee until you reach a bathroom.  Leaking pee.  Having pain when you pee.  Itching or a burning feeling.  Vaginal bleeding and spotting.  Pain during sex.  Dryness of the vagina.  A yellow, bad-smelling fluid (discharge) coming from the vagina. HOW WILL MY DOCTOR CHECK FOR THIS PROBLEM?  During your exam, your doctor will likely find the problem.  If there is a vaginal fluid, it may be checked for infection. HOW WILL THIS PROBLEM BE TREATED? Keep the vulvar skin as clean as possible. Moisturizers and lubricants can help with some of the symptoms. Estrogen replacement can help. There are 2 ways to take estrogen:  Systemic estrogen gets estrogen to your whole body. It takes many weeks or months before the symptoms get better.  You take an estrogen pill.  You use a skin patch. This is a patch that you put on your skin.  If you still have your uterus, your doctor may ask you to take a hormone. Talk to your doctor about the right medicine for you.  Estrogen cream.  This puts estrogen only at the part of your body where you apply it. The cream is put into the vagina or put on the vulvar skin. For some women, estrogen cream works faster than pills or the patch. CAN ALL WOMEN WITH THIS PROBLEM USE ESTROGEN? No. Women with certain types of cancer, liver problems, or problems with blood clots should not take estrogen. Your doctor can help you decide the best treatment for your symptoms. Document Released: 02/29/2008 Document Revised: 09/17/2013 Document Reviewed: 02/29/2008 ExitCare Patient Information 2015 [employer]. This  information is not intended to replace advice given to you by your health care provider. Make sure you discuss any questions you have with your health care provider. Monilial Vaginitis Vaginitis in a soreness, swelling and redness (inflammation) of the vagina and vulva. Monilial vaginitis is not a sexually transmitted infection. CAUSES  Yeast vaginitis is caused by yeast (candida) that is normally found in your vagina. With a yeast infection, the candida has overgrown in number to a point that upsets the chemical balance. SYMPTOMS   White, thick vaginal discharge.  Swelling, itching, redness and irritation of the vagina and possibly the lips of the vagina (vulva).  Burning or painful urination.  Painful intercourse. DIAGNOSIS  Things that may contribute to monilial vaginitis are:  Postmenopausal and virginal states.  Pregnancy.  Infections.  Being tired, sick or stressed, especially if you had monilial vaginitis in the past.  Diabetes. Good control will help lower the chance.  Birth control pills.  Tight fitting garments.  Using bubble bath, feminine sprays, douches or deodorant tampons.  Taking certain medications that kill germs (antibiotics).  Sporadic recurrence can occur if you become ill. TREATMENT  Your caregiver will give you medication.  There are several kinds of anti monilial vaginal creams and suppositories specific for monilial vaginitis. For recurrent yeast infections, use a suppository or cream in the vagina 2 times a week, or as directed.  Anti-monilial or steroid cream for the itching or irritation of the vulva may also be used. Get   your caregiver's permission.  Painting the vagina with methylene blue solution may help if the monilial cream does not work.  Eating yogurt may help prevent monilial vaginitis. HOME CARE INSTRUCTIONS   Finish all medication as prescribed.  Do not have sex until treatment is completed or after your caregiver tells you it  is okay.  Take warm sitz baths.  Do not douche.  Do not use tampons, especially scented ones.  Wear cotton underwear.  Avoid tight pants and panty hose.  Tell your sexual partner that you have a yeast infection. They should go to their caregiver if they have symptoms such as mild rash or itching.  Your sexual partner should be treated as well if your infection is difficult to eliminate.  Practice safer sex. Use condoms.  Some vaginal medications cause latex condoms to fail. Vaginal medications that harm condoms are:  Cleocin cream.  Butoconazole (Femstat).  Terconazole (Terazol) vaginal suppository.  Miconazole (Monistat) (may be purchased over the counter). SEEK MEDICAL CARE IF:   You have a temperature by mouth above 102 F (38.9 C).  The infection is getting worse after 2 days of treatment.  The infection is not getting better after 3 days of treatment.  You develop blisters in or around your vagina.  You develop vaginal bleeding, and it is not your menstrual period.  You have pain when you urinate.  You develop intestinal problems.  You have pain with sexual intercourse. Document Released: 06/22/2005 Document Revised: 12/05/2011 Document Reviewed: 03/06/2009 ExitCare Patient Information 2015 [employer]. This information is not intended to replace advice given to you by your health care provider. Make sure you discuss any questions you have with your health care provider.  

## 2014-05-13 NOTE — Progress Notes (Signed)
Reviewed personally.  M. Suzanne Joenathan Sakuma, MD.  

## 2014-05-30 ENCOUNTER — Encounter: Payer: Self-pay | Admitting: Certified Nurse Midwife

## 2014-05-30 ENCOUNTER — Ambulatory Visit (INDEPENDENT_AMBULATORY_CARE_PROVIDER_SITE_OTHER): Payer: BC Managed Care – PPO | Admitting: Certified Nurse Midwife

## 2014-05-30 VITALS — BP 120/62 | HR 68 | Resp 16 | Ht 64.0 in | Wt 142.0 lb

## 2014-05-30 DIAGNOSIS — N952 Postmenopausal atrophic vaginitis: Secondary | ICD-10-CM

## 2014-05-30 DIAGNOSIS — B373 Candidiasis of vulva and vagina: Secondary | ICD-10-CM

## 2014-05-30 DIAGNOSIS — B3731 Acute candidiasis of vulva and vagina: Secondary | ICD-10-CM

## 2014-05-30 NOTE — Progress Notes (Signed)
63 y.o. single  Caucasian female [id number] here for follow up of yeast vaginitis/vulvitis and atrophic vaginitis treated with Terazol 7 initiated on 05/12/14. Completed all medication as directed.  Denies any symptoms of itching or burning or discharge now. Patient "feels so much better". Has not started with coconut oil wanted to make sure yeast had resolved. Leaving for [place] for family reunion soon. No other health issues today.   O: Healthy WD,WN female Affect: Normal orientation Pelvic exam:EXTERNAL GENITALIA: normal appearing vulva with no masses, tenderness or lesions VAGINA: no abnormal discharge or lesions and Wet Prep/KOH no pathogens CERVIX: absent  UTERUS: absent ADNEXA: no masses or fullness, adnexa absent.  A:Yeast vaginitis and vulvitis Resolved  Atrophic vaginitis    P: Discussed findings of all yeast resolved.  Discussed importance of starting the coconut oil to help with dryness. Patient agreeable to use daily while at beach and change swim suit often.   RV prn

## 2014-05-30 NOTE — Patient Instructions (Signed)

## 2014-05-31 NOTE — Progress Notes (Signed)
Reviewed personally.  M. Suzanne Brelee Renk, MD.  

## 2014-06-06 ENCOUNTER — Other Ambulatory Visit: Payer: Self-pay | Admitting: Gynecology

## 2014-06-06 NOTE — Telephone Encounter (Signed)
Last AEX; 06/05/13 Last refill:06/05/13 #30 X 12 Current AEX:NS  Please advise

## 2014-06-10 NOTE — Telephone Encounter (Signed)
S/w patient scheduled her AEX with Dr. Charlies Constable for 06/16/14 at 10:30, aware that refill has been sent.

## 2014-06-10 NOTE — Telephone Encounter (Signed)
LMOM to contact the office

## 2014-06-10 NOTE — Telephone Encounter (Signed)
Needs annual

## 2014-06-16 ENCOUNTER — Ambulatory Visit (INDEPENDENT_AMBULATORY_CARE_PROVIDER_SITE_OTHER): Payer: BC Managed Care – PPO | Admitting: Gynecology

## 2014-06-16 ENCOUNTER — Encounter: Payer: Self-pay | Admitting: Gynecology

## 2014-06-16 VITALS — BP 118/70 | HR 72 | Ht 64.0 in | Wt 144.0 lb

## 2014-06-16 DIAGNOSIS — Z01419 Encounter for gynecological examination (general) (routine) without abnormal findings: Secondary | ICD-10-CM

## 2014-06-16 DIAGNOSIS — Z79890 Hormone replacement therapy (postmenopausal): Secondary | ICD-10-CM

## 2014-06-16 DIAGNOSIS — N951 Menopausal and female climacteric states: Secondary | ICD-10-CM

## 2014-06-16 LAB — POCT URINALYSIS DIPSTICK
BILIRUBIN UA: NEGATIVE
Glucose, UA: NEGATIVE
Ketones, UA: NEGATIVE
Leukocytes, UA: NEGATIVE
NITRITE UA: NEGATIVE
PH UA: 5
Protein, UA: NEGATIVE
RBC UA: NEGATIVE
UROBILINOGEN UA: NEGATIVE

## 2014-06-16 MED ORDER — FLUOXETINE HCL 40 MG PO CAPS: ORAL_CAPSULE | ORAL | Status: DC

## 2014-06-16 MED ORDER — ESTRADIOL 0.1 MG/24HR TD PTTW: 1.0000 | MEDICATED_PATCH | TRANSDERMAL | Status: DC

## 2014-06-16 NOTE — Progress Notes (Signed)
62 y.o. [name]   [id number] here for annual exam. Pt reports menses at all regular.  She does report hot flashes, does have night sweats, does have vaginal dryness off patch.  She is using lubricants-cocoanut oil. Pt has a recent yeast infection and vaginal dryness after being in pool all summer.  Pt states that she is doing well as long as she is on patch regularly.  Last year pt was having issues with incontinence, denies any now.  Patient's last menstrual period was 09/27/2003.          Sexually active: No.  The current method of family planning is status post hysterectomy.    Exercising: No.  The patient does not participate in regular exercise at present. Last pap:2012/2013 not sure if pap was done Abnormal PAP: no Mammogram:08/12/13 Bi-Rads Neg BSE:no Colonoscopy:2014 Dr. Collene Mares, 2 polyps per pt DEXA:unsure Alcohol:1-2 beers Tobacco:no UA: Neg, Ph: 5.0  Health Maintenance  Topic Date Due  . Pap Smear  08/27/1970  . Tetanus/tdap  08/28/1971  . Mammogram  08/27/2002  . Colonoscopy  08/27/2002  . Zostavax  08/27/2012  . Influenza Vaccine  04/26/2014    Family History  Problem Relation Age of Onset  . Thyroid disease Maternal Grandmother     Goiter  . Cancer Paternal Grandmother     Colon   . Cancer Paternal Grandfather     Lung  . Thyroid disease Daughter     Hypothyroid  . Heart disease Mother     valvular disease, valve replacement  . Atrial fibrillation Mother   . Stroke Mother   . Heart disease Brother     valve disease, valve surgery    Patient Active Problem List   Diagnosis Date Noted  . Unspecified constipation 09/06/2013  . Post-menopause on HRT (hormone replacement therapy) 06/05/2013    Past Medical History  Diagnosis Date  . Endometriosis   . Fibroid   . Hypercholesteremia 2007  . Fibrocystic breast     muliple drainage  . Urge incontinence 06/05/2013    Past Surgical History  Procedure Laterality Date  . Foot surgery  1992  .  Wisdom tooth extraction  1978  . Combined hysterectomy vaginal / oophorectomy / a&p repair  2004  . Bladder suspension  2004    done with TVH  . Breast biopsy  1980    done 3x    Allergies: Cephalosporins  Current Outpatient Prescriptions  Medication Sig Dispense Refill  . estradiol (VIVELLE-DOT) 0.1 MG/24HR patch Place 1 patch (0.1 mg total) onto the skin 2 (two) times a week.  8 patch  12  . FLUoxetine (PROZAC) 40 MG capsule TAKE ONE CAPSULE BY MOUTH EVERY DAY  30 capsule  0   No current facility-administered medications for this visit.    ROS: Pertinent items are noted in HPI.  Exam:    BP 118/70  Pulse 72  Ht 5\' 4"  (1.626 m)  Wt 144 lb (65.318 kg)  BMI 24.71 kg/m2  LMP 09/27/2003 Weight change: @WEIGHTCHANGE @ Last 3 height recordings:  Ht Readings from Last 3 Encounters:  06/16/14 5\' 4"  (1.626 m)  05/30/14 5\' 4"  (1.626 m)  05/12/14 5\' 4"  (1.626 m)   General appearance: alert, cooperative and appears stated age Head: Normocephalic, without obvious abnormality, atraumatic Neck: no adenopathy, no carotid bruit, no JVD, supple, symmetrical, trachea midline and thyroid not enlarged, symmetric, no tenderness/mass/nodules Lungs: clear to auscultation bilaterally Breasts: normal appearance, no masses or tenderness Heart: regular rate and rhythm,  S1, S2 normal, no murmur, click, rub or gallop Abdomen: soft, non-tender; bowel sounds normal; no masses,  no organomegaly Extremities: extremities normal, atraumatic, no cyanosis or edema Skin: Skin color, texture, turgor normal. No rashes or lesions Lymph nodes: Cervical, supraclavicular, and axillary nodes normal. no inguinal nodes palpated Neurologic: Grossly normal   Pelvic: External genitalia:  no lesions              Urethra: normal appearing urethra with no masses, tenderness or lesions              Bartholins and Skenes: Bartholin's, Urethra, Skene's normal                 Vagina: normal appearing vagina with normal  color and discharge, no lesions              Cervix: absent              Pap taken: No.        Bimanual Exam:  Uterus:  absent                                      Adnexa:    no masses                                      Rectovaginal: Confirms                                      Anus:  normal sphincter tone, no lesions        1. Routine gynecological examination  counseled on breast self exam, mammography screening, adequate intake of calcium and vitamin D, diet and exercise Pt is no longer having issues with fecal or urinary incontinence-not taking toviaz-ok return annually or prn Discussed PAP guideline changes, importance of weight bearing exercises, calcium, vit D and balanced diet. - POCT urinalysis dipstick  2. Menopausal symptom Pt intolerant of delaying switching her patch, recommend she continue at current dose and method  3. Post-menopause on HRT (hormone replacement therapy)  - estradiol (VIVELLE-DOT) 0.1 MG/24HR patch; Place 1 patch (0.1 mg total) onto the skin 2 (two) times a week.  Dispense: 8 patch; Refill: 12  An After Visit Summary was printed and given to the patient.

## 2014-07-28 ENCOUNTER — Encounter: Payer: Self-pay | Admitting: Gynecology

## 2014-07-28 ENCOUNTER — Other Ambulatory Visit: Payer: Self-pay | Admitting: Gynecology

## 2014-07-28 DIAGNOSIS — Z1231 Encounter for screening mammogram for malignant neoplasm of breast: Secondary | ICD-10-CM

## 2014-08-13 ENCOUNTER — Ambulatory Visit (HOSPITAL_COMMUNITY)
Admission: RE | Admit: 2014-08-13 | Discharge: 2014-08-13 | Disposition: A | Discharge status: Home / Self Care | Payer: BC Managed Care – PPO | Source: Ambulatory Visit | Attending: Gynecology | Admitting: Gynecology

## 2014-08-13 DIAGNOSIS — Z1231 Encounter for screening mammogram for malignant neoplasm of breast: Secondary | ICD-10-CM

## 2014-08-15 ENCOUNTER — Other Ambulatory Visit: Payer: Self-pay | Admitting: Gynecology

## 2014-08-15 DIAGNOSIS — R928 Other abnormal and inconclusive findings on diagnostic imaging of breast: Secondary | ICD-10-CM

## 2014-08-25 ENCOUNTER — Ambulatory Visit
Admission: RE | Admit: 2014-08-25 | Discharge: 2014-08-25 | Disposition: A | Discharge status: Home / Self Care | Payer: BC Managed Care – PPO | Source: Ambulatory Visit | Attending: Gynecology | Admitting: Gynecology

## 2014-08-25 DIAGNOSIS — R928 Other abnormal and inconclusive findings on diagnostic imaging of breast: Secondary | ICD-10-CM

## 2014-08-26 ENCOUNTER — Telehealth: Payer: Self-pay | Admitting: Obstetrics and Gynecology

## 2014-08-26 NOTE — Telephone Encounter (Signed)
Left message regarding appointment time and provider change.(ok per dpr)

## 2014-09-01 ENCOUNTER — Encounter: Payer: Self-pay | Admitting: Medical

## 2014-09-01 ENCOUNTER — Ambulatory Visit (INDEPENDENT_AMBULATORY_CARE_PROVIDER_SITE_OTHER): Payer: BC Managed Care – PPO | Admitting: Medical

## 2014-09-01 VITALS — BP 110/80 | HR 68 | Temp 97.8°F | Resp 16 | Wt 146.0 lb

## 2014-09-01 DIAGNOSIS — J988 Other specified respiratory disorders: Secondary | ICD-10-CM

## 2014-09-01 DIAGNOSIS — R059 Cough, unspecified: Secondary | ICD-10-CM

## 2014-09-01 DIAGNOSIS — R05 Cough: Secondary | ICD-10-CM

## 2014-09-01 DIAGNOSIS — J22 Unspecified acute lower respiratory infection: Secondary | ICD-10-CM

## 2014-09-01 MED ORDER — HYDROCODONE-HOMATROPINE 5-1.5 MG/5ML PO SYRP: 5.0000 mL | ORAL_SOLUTION | Freq: Three times a day (TID) | ORAL | Status: DC | PRN

## 2014-09-01 NOTE — Progress Notes (Signed)
Subjective:  Marissa Lee is a 63 y.o. female who presents for cough and illness.  Symptoms include 5 day hx/o cough, head congestion, runny nose, worse cough at night, .  Denies ear pain, ear pressure,sinus pressure, no fever, no NVD, no SOB, no wheezing.  Patient is a non-smoker.  Using Aleve, chloraseptic for symptoms.  Grandchild + sick contacts.  No other aggravating or relieving factors.  No other c/o.  ROS as in subjective   Objective: Filed Vitals:   09/01/14 1201  BP: 110/80  Pulse: 68  Temp: 97.8 F (36.6 C)  Resp: 16    General appearance: Alert, WD/WN, no distress                             Skin: warm, no rash                           Head: no sinus tenderness                            Eyes: conjunctiva normal, corneas clear, PERRLA                            Ears: right TM with fullness, yellowish mucous behind TM, left TM pearly, external ear canals normal                          Nose: septum midline, turbinates swollen, with erythema and clear discharge             Mouth/throat: MMM, tongue normal, mild pharyngeal erythema                           Neck: supple, no adenopathy, no thyromegaly, nontender                          Heart: RRR, normal S1, S2, no murmurs                         Lungs: CTA bilaterally, no wheezes, rales, or rhonchi      Assessment and Plan: Encounter Diagnoses  Name Primary?  . Acute respiratory infection Yes  . Cough     Prescription given for Hycodan syrup, begin Benadryl or Mucinex, increase water intake, rest, and if worse or not improving in the next few days call back.

## 2014-09-03 ENCOUNTER — Telehealth: Payer: Self-pay | Admitting: Emergency Medicine

## 2014-09-03 NOTE — Telephone Encounter (Signed)
Out of mammogram hold.   

## 2014-09-03 NOTE — Telephone Encounter (Signed)
-----   Message from [place], MD sent at 08/27/2014  8:34 AM EST ----- Regarding: RE: place in mammogram hold and make sure that report comes through with Quincy Simmonds on it [facility] to remove from mammogram hold.   ----- Message -----    From: Karen Chafe, RN    Sent: 08/26/2014   8:18 AM      To: Brook E Amundson de Berton Lan, MD Subject: RE: place in mammogram hold and make sure th#  Dr. Quincy Simmonds,  There is a new result that was sent to Dr. Charlies Constable. Can you review and advise if okay to remove from hold?   (I'm sorry! I was out when you sent the message last week) ----- Message -----    From: Jamey Reas de Berton Lan, MD    Sent: 08/17/2014   6:15 PM      To: Karen Chafe, RN Subject: place in mammogram hold and make sure that r#  Marissa Lee,   This is a patient of Dr. Brion Aliment who needs to be in mammogram hold.  Will you be sure that [facility] is sending [facility] ultrasound report through with my name on it, so there is no confusion?  Thanks,   [name]

## 2014-12-04 ENCOUNTER — Encounter (HOSPITAL_COMMUNITY): Payer: Self-pay | Admitting: Emergency Medicine

## 2014-12-04 ENCOUNTER — Emergency Department (INDEPENDENT_AMBULATORY_CARE_PROVIDER_SITE_OTHER)
Admission: EM | Admit: 2014-12-04 | Discharge: 2014-12-04 | Disposition: A | Discharge status: Home / Self Care | Payer: BLUE CROSS/BLUE SHIELD | Source: Home / Self Care | Attending: Family Medicine | Admitting: Family Medicine

## 2014-12-04 DIAGNOSIS — H8110 Benign paroxysmal vertigo, unspecified ear: Secondary | ICD-10-CM | POA: Diagnosis not present

## 2014-12-04 DIAGNOSIS — IMO0002 Reserved for concepts with insufficient information to code with codable children: Secondary | ICD-10-CM

## 2014-12-04 MED ORDER — DIPHENHYDRAMINE HCL 50 MG/ML IJ SOLN
25.0000 mg | Freq: Once | INTRAMUSCULAR | Status: AC
  Administered 2014-12-04: 25 mg via INTRAMUSCULAR

## 2014-12-04 MED ORDER — MECLIZINE HCL 50 MG PO TABS: 50.0000 mg | ORAL_TABLET | Freq: Three times a day (TID) | ORAL | Status: DC | PRN

## 2014-12-04 MED ORDER — DIPHENHYDRAMINE HCL 50 MG/ML IJ SOLN
INTRAMUSCULAR | Status: AC
  Filled 2014-12-04: qty 1

## 2014-12-04 MED ORDER — ONDANSETRON HCL 4 MG/2ML IJ SOLN
4.0000 mg | Freq: Once | INTRAMUSCULAR | Status: AC
  Administered 2014-12-04: 4 mg via INTRAMUSCULAR

## 2014-12-04 MED ORDER — ONDANSETRON HCL 4 MG/2ML IJ SOLN
INTRAMUSCULAR | Status: AC
  Filled 2014-12-04: qty 2

## 2014-12-04 NOTE — ED Notes (Signed)
This nurse called to lobby to check patient.  Patient is sitting in wheelchair, eyes closed, holding head in hands.  Reports waking around 3:30 am with blurred vision, woke again around 9:00 with continued dizziness.  Tried to walk to walk to bathroom and became nauseated.

## 2014-12-04 NOTE — ED Provider Notes (Signed)
CSN: [id number]     Arrival date & time 12/04/14  1048 History   First MD Initiated Contact with Patient 12/04/14 1105     Chief Complaint  Patient presents with  . Dizziness   (Consider location/radiation/quality/duration/timing/severity/associated sxs/prior Treatment) HPI       64 year old female presents complaining of dizziness. She awoke this morning with a feeling of the room spinning around her. This is severe, very bad with any movement. When she sits still and closes her eyes it goes away after a few minutes. It has made her very nauseous and she has done up a few times. She has never had this before. She denies any other systemic symptoms or fever, chest pain. No numbness or weakness. no recent illnesses  Past Medical History  Diagnosis Date  . Endometriosis   . Fibroid   . Hypercholesteremia 2007  . Fibrocystic breast     muliple drainage  . Urge incontinence 06/05/2013   Past Surgical History  Procedure Laterality Date  . Foot surgery  1992  . Wisdom tooth extraction  1978  . Combined hysterectomy vaginal / oophorectomy / a&p repair  2004  . Bladder suspension  2004    done with TVH  . Breast biopsy  1980    done 3x   Family History  Problem Relation Age of Onset  . Thyroid disease Maternal Grandmother     Goiter  . Cancer Paternal Grandmother     Colon   . Cancer Paternal Grandfather     Lung  . Thyroid disease Daughter     Hypothyroid  . Heart disease Mother     valvular disease, valve replacement  . Atrial fibrillation Mother   . Stroke Mother   . Heart disease Brother     valve disease, valve surgery   History  Substance Use Topics  . Smoking status: Former Smoker -- 1.00 packs/day for 20 years    Types: Cigarettes  . Smokeless tobacco: Former Systems developer    Quit date: 09/26/2000  . Alcohol Use: 0.5 - 1.0 oz/week    1-2 drink(s) per week     Comment: occasinal   OB History    Gravida Para Term Preterm AB TAB SAB Ectopic Multiple Living   5 2 2  3  3    2      Review of Systems  Constitutional: Negative for fever.  HENT: Negative for ear pain.   Respiratory: Negative for shortness of breath.   Cardiovascular: Negative for chest pain.  Gastrointestinal: Positive for nausea and vomiting. Negative for diarrhea.  Neurological: Positive for dizziness. Negative for facial asymmetry, weakness, numbness and headaches.  All other systems reviewed and are negative.   Allergies  Cephalosporins  Home Medications   Prior to Admission medications   Medication Sig Start Date End Date Taking? Authorizing Provider  estradiol (VIVELLE-DOT) 0.1 MG/24HR patch Place 1 patch (0.1 mg total) onto the skin 2 (two) times a week. 06/16/14   Elveria Rising, MD  FLUoxetine (PROZAC) 40 MG capsule TAKE ONE CAPSULE BY MOUTH EVERY DAY 06/16/14   Elveria Rising, MD  HYDROcodone-homatropine [facility]) 5-1.5 MG/5ML syrup Take 5 mLs by mouth every 8 (eight) hours as needed for cough. 09/01/14   Camelia Eng Tysinger, PA-C  Linaclotide (LINZESS PO) Take by mouth.    Historical Provider, MD  meclizine (ANTIVERT) 50 MG tablet Take 1 tablet (50 mg total) by mouth 3 (three) times daily as needed for dizziness or nausea. 12/04/14   Liam Graham, PA-C  BP 158/73 mmHg  Pulse 68  Temp(Src) 98.4 F (36.9 C) (Oral)  Resp 14  SpO2 100%  LMP 09/27/2003 Physical Exam  Constitutional: She is oriented to person, place, and time. Vital signs are normal. She appears well-developed and well-nourished. No distress.  HENT:  Head: Normocephalic and atraumatic.  Eyes: EOM are normal. Pupils are equal, round, and reactive to light.  Cardiovascular: Normal rate, regular rhythm and normal heart sounds.   Pulmonary/Chest: Effort normal and breath sounds normal. No respiratory distress.  Neurological: She is alert and oriented to person, place, and time. She has normal strength and normal reflexes. No cranial nerve deficit or sensory deficit. She exhibits normal muscle tone. She displays a negative  Romberg sign. Coordination and gait normal. GCS eye subscore is 4. GCS verbal subscore is 5. GCS motor subscore is 6.  Rapid alternating hand movements are normal. Finger to nose normal. Heel-to-shin is normal  Skin: Skin is warm and dry. No rash noted. She is not diaphoretic.  Psychiatric: She has a normal mood and affect. Judgment normal.  Nursing note and vitals reviewed.   ED Course  ED EKG  Date/Time: 12/04/2014 12:31 PM Performed by: Liam Graham Authorized by: Allena Katz H Comparison: not compared with previous ECG  Rhythm: sinus bradycardia Rate: normal QRS axis: normal Conduction: conduction normal ST Segments: ST segments normal T Waves: T waves normal Other: no other findings Clinical impression: normal ECG Comments: Agree with interpretation   (including critical care time) Labs Review Labs Reviewed - No data to display  Imaging Review No results found.   MDM   1. Positional vertigo, unspecified laterality    The EKG is normal. She has some improvement with Zofran and Benadryl. Her exam and history is consistent with benign positional vertigo. Treat with meclizine and rest. Follow-up when necessary if no improvement in a few days. ED if worsening, return precautions discussed   Meds ordered this encounter  Medications  . ondansetron (ZOFRAN) injection 4 mg    Sig:   . diphenhydrAMINE (BENADRYL) injection 25 mg    Sig:   . meclizine (ANTIVERT) 50 MG tablet    Sig: Take 1 tablet (50 mg total) by mouth 3 (three) times daily as needed for dizziness or nausea.    Dispense:  20 tablet    Refill:  0    Order Specific Question:  Supervising Provider    Answer:  Gregor Hams [[id number]]       Liam Graham, PA-C 12/04/14 1231

## 2014-12-04 NOTE — Discharge Instructions (Signed)
Benign Positional Vertigo °Vertigo means you feel like you or your surroundings are moving when they are not. Benign positional vertigo is the most common form of vertigo. Benign means that the cause of your condition is not serious. Benign positional vertigo is more common in older adults. °CAUSES  °Benign positional vertigo is the result of an upset in the labyrinth system. This is an area in the middle ear that helps control your balance. This may be caused by a viral infection, head injury, or repetitive motion. However, often no specific cause is found. °SYMPTOMS  °Symptoms of benign positional vertigo occur when you move your head or eyes in different directions. Some of the symptoms may include: °1. Loss of balance and falls. °2. Vomiting. °3. Blurred vision. °4. Dizziness. °5. Nausea. °6. Involuntary eye movements (nystagmus). °DIAGNOSIS  °Benign positional vertigo is usually diagnosed by physical exam. If the specific cause of your benign positional vertigo is unknown, your caregiver may perform imaging tests, such as magnetic resonance imaging (MRI) or computed tomography (CT). °TREATMENT  °Your caregiver may recommend movements or procedures to correct the benign positional vertigo. Medicines such as meclizine, benzodiazepines, and medicines for nausea may be used to treat your symptoms. In rare cases, if your symptoms are caused by certain conditions that affect the inner ear, you may need surgery. °HOME CARE INSTRUCTIONS  °· Follow your caregiver's instructions. °· Move slowly. Do not make sudden body or head movements. °· Avoid driving. °· Avoid operating heavy machinery. °· Avoid performing any tasks that would be dangerous to you or others during a vertigo episode. °· Drink enough fluids to keep your urine clear or pale yellow. °SEEK IMMEDIATE MEDICAL CARE IF:  °· You develop problems with walking, weakness, numbness, or using your arms, hands, or legs. °· You have difficulty speaking. °· You develop  severe headaches. °· Your nausea or vomiting continues or gets worse. °· You develop visual changes. °· Your family or friends notice any behavioral changes. °· Your condition gets worse. °· You have a fever. °· You develop a stiff neck or sensitivity to light. °MAKE SURE YOU:  °· Understand these instructions. °· Will watch your condition. °· Will get help right away if you are not doing well or get worse. °Document Released: 06/20/2006 Document Revised: 12/05/2011 Document Reviewed: 06/02/2011 °ExitCare® Patient Information ©2015 [employer]. This information is not intended to replace advice given to you by your health care provider. Make sure you discuss any questions you have with your health care provider. ° °Epley Maneuver Self-Care °WHAT IS THE EPLEY MANEUVER? °The Epley maneuver is an exercise you can do to relieve symptoms of benign paroxysmal positional vertigo (BPPV). This condition is often just referred to as vertigo. BPPV is caused by the movement of tiny crystals (canaliths) inside your inner ear. The accumulation and movement of canaliths in your inner ear causes a sudden spinning sensation (vertigo) when you move your head to certain positions. Vertigo usually lasts about 30 seconds. BPPV usually occurs in just one ear. If you get vertigo when you lie on your left side, you probably have BPPV in your left ear. Your health care provider can tell you which ear is involved.  °BPPV may be caused by a head injury. Many people older than 50 get BPPV for unknown reasons. If you have been diagnosed with BPPV, your health care provider may teach you how to do this maneuver. BPPV is not life threatening (benign) and usually goes away in time.  °  WHEN SHOULD I PERFORM THE EPLEY MANEUVER? °You can do this maneuver at home whenever you have symptoms of vertigo. You may do the Epley maneuver up to 3 times a day until your symptoms of vertigo go away. °HOW SHOULD I DO THE EPLEY MANEUVER? °7. Sit on the edge of a  bed or table with your back straight. Your legs should be extended or hanging over the edge of the bed or table.   °8. Turn your head halfway toward the affected ear.   °9. Lie backward quickly with your head turned until you are lying flat on your back. You may want to position a pillow under your shoulders.   °10. Hold this position for 30 seconds. You may experience an attack of vertigo. This is normal. Hold this position until the vertigo stops. °11. Then turn your head to the opposite direction until your unaffected ear is facing the floor.   °12. Hold this position for 30 seconds. You may experience an attack of vertigo. This is normal. Hold this position until the vertigo stops. °13. Now turn your whole body to the same side as your head. Hold for another 30 seconds.   °14. You can then sit back up. °ARE THERE RISKS TO THIS MANEUVER? °In some cases, you may have other symptoms (such as changes in your vision, weakness, or numbness). If you have these symptoms, stop doing the maneuver and call your health care provider. Even if doing these maneuvers relieves your vertigo, you may still have dizziness. Dizziness is the sensation of light-headedness but without the sensation of movement. Even though the Epley maneuver may relieve your vertigo, it is possible that your symptoms will return within 5 years. °WHAT SHOULD I DO AFTER THIS MANEUVER? °After doing the Epley maneuver, you can return to your normal activities. Ask your doctor if there is anything you should do at home to prevent vertigo. This may include: °· Sleeping with two or more pillows to keep your head elevated. °· Not sleeping on the side of your affected ear. °· Getting up slowly from bed. °· Avoiding sudden movements during the day. °· Avoiding extreme head movement, like looking up or bending over. °· Wearing a cervical collar to prevent sudden head movements. °WHAT SHOULD I DO IF MY SYMPTOMS GET WORSE? °Call your health care provider if your  vertigo gets worse. Call your provider right way if you have other symptoms, including:  °· Nausea. °· Vomiting. °· Headache. °· Weakness. °· Numbness. °· Vision changes. °Document Released: 09/17/2013 Document Reviewed: 09/17/2013 °ExitCare® Patient Information ©2015 [employer]. This information is not intended to replace advice given to you by your health care provider. Make sure you discuss any questions you have with your health care provider. ° °

## 2014-12-04 NOTE — ED Notes (Signed)
Assisted to vehicle, wheelchair

## 2014-12-15 ENCOUNTER — Telehealth: Payer: Self-pay | Admitting: Obstetrics and Gynecology

## 2014-12-15 ENCOUNTER — Ambulatory Visit: Payer: BC Managed Care – PPO | Admitting: Obstetrics and Gynecology

## 2014-12-15 ENCOUNTER — Ambulatory Visit: Payer: BC Managed Care – PPO | Admitting: Gynecology

## 2014-12-15 NOTE — Telephone Encounter (Signed)
FYI.   Message to patient to reschedule from front staff.  I am not certain if she is in mammogram follow up.

## 2014-12-15 NOTE — Telephone Encounter (Signed)
LMTCB about dr/cx for 6 month breast recheck/rd

## 2014-12-16 NOTE — Telephone Encounter (Signed)
Patient has appointment scheduled for 12/22/14 with Dr. Quincy Simmonds.  Patient is not currently in [place] hold or recall.

## 2014-12-22 ENCOUNTER — Ambulatory Visit (INDEPENDENT_AMBULATORY_CARE_PROVIDER_SITE_OTHER): Payer: BLUE CROSS/BLUE SHIELD | Admitting: Obstetrics and Gynecology

## 2014-12-22 ENCOUNTER — Encounter: Payer: Self-pay | Admitting: Obstetrics and Gynecology

## 2014-12-22 VITALS — BP 120/66 | HR 84 | Resp 16 | Ht 64.0 in | Wt 140.0 lb

## 2014-12-22 DIAGNOSIS — H8110 Benign paroxysmal vertigo, unspecified ear: Secondary | ICD-10-CM | POA: Diagnosis not present

## 2014-12-22 DIAGNOSIS — N6002 Solitary cyst of left breast: Secondary | ICD-10-CM | POA: Diagnosis not present

## 2014-12-22 NOTE — Progress Notes (Signed)
GYNECOLOGY  VISIT   HPI: 64 y.o.   Single  Caucasian  female   [phone] with Patient's last menstrual period was 09/27/2003.   here for   Follow up Breast Check.  Has a history of multiple prior breast biopsies of bilateral :  Right breast in 1983, left breast in 1987, bilateral breasts in 1992.  All benign.   Had a diagnostic mammogram and ultrasound of the left breast in November 2015.  2 cysts were noted:  1.8 cm at 2:00 and 1.9 cm at 3:00.  Both benign in appearance.  Patient is unable to feel these areas and they were not palpable on routine annual exam in September 2015.   Currently is on Vivelle Dot for the last 10 years.  She started this for hot flashes and emotional changes.  Does not want to wean off.   Having sleep issues and vertigo. Has been to the hospital for this.  Rx for Meclizine.  Wondering is she needs sleep evaluation.  Wants assistance with this.   GYNECOLOGIC HISTORY: Patient's last menstrual period was 09/27/2003. Contraception: Hysterectomy  Menopausal hormone therapy: Estradiol patch        OB History    Gravida Para Term Preterm AB TAB SAB Ectopic Multiple Living   5 2 2  3  3   2          Patient Active Problem List   Diagnosis Date Noted  . Unspecified constipation 09/06/2013  . Post-menopause on HRT (hormone replacement therapy) 06/05/2013    Past Medical History  Diagnosis Date  . Endometriosis   . Fibroid   . Hypercholesteremia 2007  . Fibrocystic breast     muliple drainage  . Urge incontinence 06/05/2013    Past Surgical History  Procedure Laterality Date  . Foot surgery  1992  . Wisdom tooth extraction  1978  . Combined hysterectomy vaginal / oophorectomy / a&p repair  2004  . Bladder suspension  2004    done with TVH  . Breast biopsy  1980    done 3x    Current Outpatient Prescriptions  Medication Sig Dispense Refill  . doxycycline (PERIOSTAT) 20 MG tablet   2  . estradiol (VIVELLE-DOT) 0.1 MG/24HR patch Place 1 patch  (0.1 mg total) onto the skin 2 (two) times a week. 8 patch 12  . FLUoxetine (PROZAC) 40 MG capsule TAKE ONE CAPSULE BY MOUTH EVERY DAY 30 capsule 11  . Melatonin 10 MG CAPS Take by mouth at bedtime as needed.     No current facility-administered medications for this visit.     ALLERGIES: Cephalosporins  Family History  Problem Relation Age of Onset  . Thyroid disease Maternal Grandmother     Goiter  . Cancer Paternal Grandmother     Colon   . Cancer Paternal Grandfather     Lung  . Thyroid disease Daughter     Hypothyroid  . Heart disease Mother     valvular disease, valve replacement  . Atrial fibrillation Mother   . Stroke Mother   . Heart disease Brother     valve disease, valve surgery    History   Social History  . Marital Status: Single    Spouse Name: N/A  . Number of Children: N/A  . Years of Education: N/A   Occupational History  . Not on file.   Social History Main Topics  . Smoking status: Former Smoker -- 1.00 packs/day for 20 years    Types: Cigarettes  .  Smokeless tobacco: Former Systems developer    Quit date: 09/26/2000  . Alcohol Use: 0.5 - 1.0 oz/week    1-2 drink(s) per week     Comment: occasinal  . Drug Use: No  . Sexual Activity: No   Other Topics Concern  . Not on file   Social History Narrative    ROS:  Pertinent items are noted in HPI.  PHYSICAL EXAMINATION:    BP 120/66 mmHg  Pulse 84  Resp 16  Ht 5\' 4"  (1.626 m)  Wt 140 lb (63.504 kg)  BMI 24.02 kg/m2  LMP 09/27/2003     General appearance: alert, cooperative and appears stated age Breasts:  Bilateral scattered scars noted.  No dominant masses, skin retractions, nipple discharge, or axillary adenopathy noted.   ASSESSMENT  Benign left breast cysts.  Not palpable. ERT patient. Vertigo.   PLAN  Return to yearly mammogram screening in November 2016 as suggested by the [facility].  OK to continue with estrogen therapy.  Referral to ENT done.  I did encourage the patient to  establish care with a PCP.  Return for annual exam in September 2016.    An After Visit Summary was printed and given to the patient.  __25____ minutes face to face time of which over 50% was spent in counseling.

## 2014-12-24 ENCOUNTER — Telehealth: Payer: Self-pay | Admitting: Obstetrics and Gynecology

## 2014-12-24 NOTE — Telephone Encounter (Signed)
Left message for patient to call back. Need to advise that she is scheduled with DR Hurman Horn, Otolaryngologist on April 13 at 1500. Arrival time 1430. She is to bring her id card, copay,  and a list of current medications. Office phone # [phone].

## 2015-02-02 ENCOUNTER — Encounter: Payer: Self-pay | Admitting: Medical

## 2015-02-02 ENCOUNTER — Ambulatory Visit (INDEPENDENT_AMBULATORY_CARE_PROVIDER_SITE_OTHER): Payer: BLUE CROSS/BLUE SHIELD | Admitting: Medical

## 2015-02-02 VITALS — BP 100/70 | HR 85 | Temp 98.0°F | Resp 15 | Wt 142.0 lb

## 2015-02-02 DIAGNOSIS — H8110 Benign paroxysmal vertigo, unspecified ear: Secondary | ICD-10-CM | POA: Diagnosis not present

## 2015-02-02 DIAGNOSIS — Z79890 Hormone replacement therapy (postmenopausal): Secondary | ICD-10-CM

## 2015-02-02 MED ORDER — ONDANSETRON HCL 4 MG PO TABS: 4.0000 mg | ORAL_TABLET | Freq: Three times a day (TID) | ORAL | Status: DC | PRN

## 2015-02-02 MED ORDER — SCOPOLAMINE 1 MG/3DAYS TD PT72
1.0000 | MEDICATED_PATCH | TRANSDERMAL | Status: DC
Start: 2015-02-02 — End: 2015-07-20

## 2015-02-02 MED ORDER — MECLIZINE HCL 25 MG PO TABS: ORAL_TABLET | ORAL | Status: DC

## 2015-02-02 NOTE — Progress Notes (Signed)
Subjective: Here for vertigo.  She notes awakening with vertigo back on 12/04/14.  Got nauseated and vomited after getting up out of bed given the vertigo symptoms.  Couldn't really get out of bed, and daughter took her to the ED, had initial eval to rule out stroke.  Was given 2 different medications, zofran and benadryl, discharged home with meclizine.  Took meclizine for several days and symptoms resolved.  Symptoms haven't come back.  She is going on a cruise 02/21/15 to [place], won't be back until June 7th.   Currently wants to be proactive to avoid another episode of Vertigo while on the trip. No other aggravating or relieving factors. No other complaint.   Past Medical History  Diagnosis Date  . Endometriosis   . Fibroid   . Hypercholesteremia 2007  . Fibrocystic breast     muliple drainage  . Urge incontinence 06/05/2013   ROS as in subjective   Objective: BP 100/70 mmHg  Pulse 85  Temp(Src) 98 F (36.7 C) (Oral)  Resp 15  Wt 142 lb (64.411 kg)  LMP 09/27/2003  General appearance: alert, no distress, WD/WN HEENT: normocephalic, sclerae anicteric, PERRLA, EOMi, nares patent, no discharge or erythema, pharynx normal Oral cavity: MMM, no lesions Neck: supple, no lymphadenopathy, no thyromegaly, no masses Heart: RRR, normal S1, S2, no murmurs Lungs: CTA bilaterally, no wheezes, rhonchi, or rales Extremities: no edema, no cyanosis, no clubbing Pulses: 2+ symmetric, upper and lower extremities, normal cap refill Neurological: alert, oriented x 3, CN2-12 intact, strength normal upper extremities and lower extremities, sensation normal throughout, DTRs 2+ throughout, no cerebellar signs, gait normal Psychiatric: normal affect, behavior normal, pleasant     Assessment: Encounter Diagnoses  Name Primary?  . Benign paroxysmal positional vertigo, unspecified laterality Yes  . Hormone replacement therapy (HRT)     Plan: Currently asymptomatic.   discussed diagnosis, treatment  recommendations, risks/benefits of medication.  Begin trial of Scopolamine prior to the trip, and if no issues, can use before and during the trip.  Meclizine and zofran prescribed for prn use.  Advised OTC Compression hose.  Advised walking and DVT prevention particularly given the HRT.   F/u prn.

## 2015-02-07 ENCOUNTER — Telehealth: Payer: Self-pay | Admitting: Medical

## 2015-02-24 NOTE — Telephone Encounter (Signed)
Per pharmacy, went thru ins and pt picked up

## 2015-06-25 ENCOUNTER — Ambulatory Visit (INDEPENDENT_AMBULATORY_CARE_PROVIDER_SITE_OTHER): Payer: BLUE CROSS/BLUE SHIELD | Admitting: Obstetrics and Gynecology

## 2015-06-25 ENCOUNTER — Encounter: Payer: Self-pay | Admitting: Obstetrics and Gynecology

## 2015-06-25 VITALS — BP 138/82 | HR 66 | Resp 16 | Ht 64.0 in | Wt 145.6 lb

## 2015-06-25 DIAGNOSIS — Z0000 Encounter for general adult medical examination without abnormal findings: Secondary | ICD-10-CM

## 2015-06-25 DIAGNOSIS — Z79899 Other long term (current) drug therapy: Secondary | ICD-10-CM | POA: Diagnosis not present

## 2015-06-25 DIAGNOSIS — Z01419 Encounter for gynecological examination (general) (routine) without abnormal findings: Secondary | ICD-10-CM | POA: Diagnosis not present

## 2015-06-25 DIAGNOSIS — G479 Sleep disorder, unspecified: Secondary | ICD-10-CM | POA: Diagnosis not present

## 2015-06-25 DIAGNOSIS — Z79890 Hormone replacement therapy (postmenopausal): Secondary | ICD-10-CM | POA: Diagnosis not present

## 2015-06-25 LAB — CBC
HCT: 43.1 % (ref 36.0–46.0)
Hemoglobin: 14.2 g/dL (ref 12.0–15.0)
MCH: 31.3 pg (ref 26.0–34.0)
MCHC: 32.9 g/dL (ref 30.0–36.0)
MCV: 94.9 fL (ref 78.0–100.0)
MPV: 8.9 fL (ref 8.6–12.4)
Platelets: 320 10*3/uL (ref 150–400)
RBC: 4.54 MIL/uL (ref 3.87–5.11)
RDW: 13 % (ref 11.5–15.5)
WBC: 5.6 10*3/uL (ref 4.0–10.5)

## 2015-06-25 LAB — POCT URINALYSIS DIPSTICK
Bilirubin, UA: NEGATIVE
GLUCOSE UA: NEGATIVE
Ketones, UA: NEGATIVE
LEUKOCYTES UA: NEGATIVE
NITRITE UA: NEGATIVE
Protein, UA: NEGATIVE
RBC UA: NEGATIVE
Urobilinogen, UA: NEGATIVE
pH, UA: 5

## 2015-06-25 LAB — COMPREHENSIVE METABOLIC PANEL
ALT: 14 U/L (ref 6–29)
AST: 16 U/L (ref 10–35)
Albumin: 4 g/dL (ref 3.6–5.1)
Alkaline Phosphatase: 44 U/L (ref 33–130)
BUN: 11 mg/dL (ref 7–25)
CO2: 26 mmol/L (ref 20–31)
Calcium: 8.9 mg/dL (ref 8.6–10.4)
Chloride: 101 mmol/L (ref 98–110)
Creat: 0.82 mg/dL (ref 0.50–0.99)
Glucose, Bld: 81 mg/dL (ref 65–99)
Potassium: 3.9 mmol/L (ref 3.5–5.3)
Sodium: 135 mmol/L (ref 135–146)
TOTAL PROTEIN: 6.6 g/dL (ref 6.1–8.1)
Total Bilirubin: 0.4 mg/dL (ref 0.2–1.2)

## 2015-06-25 LAB — LIPID PANEL
CHOLESTEROL: 249 mg/dL — AB (ref 125–200)
HDL: 50 mg/dL (ref >=46)
LDL Cholesterol: 162 mg/dL — ABNORMAL HIGH (ref <130)
TRIGLYCERIDES: 184 mg/dL — AB (ref <150)
Total CHOL/HDL Ratio: 5 Ratio (ref <=5.0)
VLDL: 37 mg/dL — AB (ref <30)

## 2015-06-25 LAB — TSH: TSH: 2.904 u[IU]/mL (ref 0.350–4.500)

## 2015-06-25 MED ORDER — ESTRADIOL 0.05 MG/24HR TD PTTW: 1.0000 | MEDICATED_PATCH | TRANSDERMAL | Status: DC

## 2015-06-25 MED ORDER — FLUOXETINE HCL 40 MG PO CAPS: ORAL_CAPSULE | ORAL | Status: DC

## 2015-06-25 NOTE — Patient Instructions (Signed)
EXERCISE AND DIET:  We recommended that you start or continue a regular exercise program for good health. Regular exercise means any activity that makes your heart beat faster and makes you sweat.  We recommend exercising at least 30 minutes per day at least 3 days a week, preferably 4 or 5.  We also recommend a diet low in fat and sugar.  Inactivity, poor dietary choices and obesity can cause diabetes, heart attack, stroke, and kidney damage, among others.    ALCOHOL AND SMOKING:  Women should limit their alcohol intake to no more than 7 drinks/beers/glasses of wine (combined, not each!) per week. Moderation of alcohol intake to this level decreases your risk of breast cancer and liver damage. And of course, no recreational drugs are part of a healthy lifestyle.  And absolutely no smoking or even second hand smoke. Most people know smoking can cause heart and lung diseases, but did you know it also contributes to weakening of your bones? Aging of your skin?  Yellowing of your teeth and nails?  CALCIUM AND VITAMIN D:  Adequate intake of calcium and Vitamin D are recommended.  The recommendations for exact amounts of these supplements seem to change often, but generally speaking 600 mg of calcium (either carbonate or citrate) and 800 units of Vitamin D per day seems prudent. Certain women may benefit from higher intake of Vitamin D.  If you are among these women, your doctor will have told you during your visit.  You make take a calcium tablet with 600 mg calcium plus the vitamin D added.  Take one in the morning and one in the evening to give you your full daily requirements.  PAP SMEARS:  Pap smears, to check for cervical cancer or precancers,  have traditionally been done yearly, although recent scientific advances have shown that most women can have pap smears less often.  However, every woman still should have a physical exam from her gynecologist every year. It will include a breast check, inspection of  the vulva and vagina to check for abnormal growths or skin changes, a visual exam of the cervix, and then an exam to evaluate the size and shape of the uterus and ovaries.  And after 63 years of age, a rectal exam is indicated to check for rectal cancers. We will also provide age appropriate advice regarding health maintenance, like when you should have certain vaccines, screening for sexually transmitted diseases, bone density testing, colonoscopy, mammograms, etc.   MAMMOGRAMS:  All women over 28 years old should have a yearly mammogram. Many facilities now offer a "3D" mammogram, which may cost around $50 extra out of pocket. If possible,  we recommend you accept the option to have the 3D mammogram performed.  It both reduces the number of women who will be called back for extra views which then turn out to be normal, and it is better than the routine mammogram at detecting truly abnormal areas.    COLONOSCOPY:  Colonoscopy to screen for colon cancer is recommended for all women at age 59.  We know, you hate the idea of the prep.  We agree, BUT, having colon cancer and not knowing it is worse!!  Colon cancer so often starts as a polyp that can be seen and removed at colonscopy, which can quite literally save your life!  And if your first colonoscopy is normal and you have no family history of colon cancer, most women don't have to have it again for 10 years.  Once every ten years, you can do something that may end up saving your life, right?  We will be happy to help you get it scheduled when you are ready.  Be sure to check your insurance coverage so you understand how much it will cost.  It may be covered as a preventative service at no cost, but you should check your particular policy.

## 2015-06-25 NOTE — Progress Notes (Signed)
Patient ID: Marissa Lee, female   DOB: Dec 11, 1950, 64 y.o.   MRN: [id number] 64 y.o. [id number] [name] here for annual exam.     Has a history of multiple prior breast biopsies of bilateral : Right breast in 1983, left breast in 1987, bilateral breasts in 1992. All benign.   Had a diagnostic mammogram and ultrasound of the left breast in November 2015. 2 cysts were noted: 1.8 cm at 2:00 and 1.9 cm at 3:00. Both benign in appearance.  Patient is unable to feel these areas and they were not palpable on routine annual exam in September 2015.   Currently is on Vivelle Dot for the last 10 years.  She started this for hot flashes and emotional changes.  Does not want to stop due to hot flashes when she does not use it.   Lots of travels this year.   Episode of vertigo in March.   Takes Prozac for PMS symptoms.  Has sleep disturbance and uncertain if this is due to this medication.  Uncertain if really has depression or anxiety.   PCP:   Chana Bode, MD  Patient's last menstrual period was 09/27/2003.          Sexually active: No. female partner The current method of family planning is status post hysterectomy.    Exercising: No.   Smoker:  Former smoker  Health Maintenance: Pap:  ?years ago History of abnormal Pap:  No, Patient doesn't think she's ever had an abnormal pap. MMG:  08-14-14 Density Cat.C/Possible Lt.breast masses;Left U/S performed showing cysts at 2 o'clock and 3 o'clock negative for malignancy/BiRads 2:Screening 1 yr:[facility]. Colonoscopy:  10-06-14 polyp with Dr. Collene Mares. Next due 09/2019. BMD:   Unsure-?41yrs Result  ?Normal:[place] TDaP:  Unsure Screening Labs:    Urine today: Neg   reports that she has quit smoking. Her smoking use included Cigarettes. She has a 20 pack-year smoking history. She quit smokeless tobacco use about 14 years ago. She reports that she drinks about 0.6 - 1.2 oz of alcohol per week. She reports that she does not use  illicit drugs.  Past Medical History  Diagnosis Date  . Endometriosis   . Fibroid   . Hypercholesteremia 2007  . Fibrocystic breast     muliple drainage  . Urge incontinence 06/05/2013    Past Surgical History  Procedure Laterality Date  . Foot surgery  1992  . Wisdom tooth extraction  1978  . Combined hysterectomy vaginal / oophorectomy / a&p repair  2004  . Bladder suspension  2004    done with TVH  . Breast biopsy  1980    done 3x    Current Outpatient Prescriptions  Medication Sig Dispense Refill  . doxycycline (ORACEA) 40 MG capsule Take 40 mg by mouth every morning.    Marland Kitchen estradiol (VIVELLE-DOT) 0.1 MG/24HR patch Place 1 patch (0.1 mg total) onto the skin 2 (two) times a week. 8 patch 12  . FLUoxetine (PROZAC) 40 MG capsule TAKE ONE CAPSULE BY MOUTH EVERY DAY 30 capsule 11  . meclizine (ANTIVERT) 25 MG tablet BID for vertigo symptoms 60 tablet 0  . Melatonin 10 MG CAPS Take by mouth at bedtime as needed.    . ondansetron (ZOFRAN) 4 MG tablet Take 1 tablet (4 mg total) by mouth every 8 (eight) hours as needed for nausea or vomiting. 20 tablet 0  . scopolamine (TRANSDERM-SCOP, 1.5 MG,) 1 MG/3DAYS Place 1 patch (1.5 mg total) onto the skin every 3 (  three) days. 10 patch 0   No current facility-administered medications for this visit.    Family History  Problem Relation Age of Onset  . Thyroid disease Maternal Grandmother     Goiter  . Cancer Paternal Grandmother     Colon   . Cancer Paternal Grandfather     Lung  . Thyroid disease Daughter     Hypothyroid  . Heart disease Mother     valvular disease, valve replacement  . Atrial fibrillation Mother   . Stroke Mother   . Heart disease Brother     valve disease, valve surgery    ROS:  Pertinent items are noted in HPI.  Otherwise, a comprehensive ROS was negative.  Exam:   BP 138/82 mmHg  Pulse 66  Resp 16  Ht 5\' 4"  (1.626 m)  Wt 145 lb 9.6 oz (66.044 kg)  BMI 24.98 kg/m2  LMP 09/27/2003    General  appearance: alert, cooperative and appears stated age Head: Normocephalic, without obvious abnormality, atraumatic Neck: no adenopathy, supple, symmetrical, trachea midline and thyroid normal to inspection and palpation Lungs: clear to auscultation bilaterally Breasts: normal appearance, no masses or tenderness, Inspection negative, No nipple retraction or dimpling, No nipple discharge or bleeding, No axillary or supraclavicular adenopathy Heart: regular rate and rhythm Abdomen: soft, non-tender; bowel sounds normal; no masses,  no organomegaly Extremities: extremities normal, atraumatic, no cyanosis or edema Skin: Skin color, texture, turgor normal. No rashes or lesions Lymph nodes: Cervical, supraclavicular, and axillary nodes normal. No abnormal inguinal nodes palpated Neurologic: Grossly normal  Pelvic: External genitalia:  no lesions              Urethra:  normal appearing urethra with no masses, tenderness or lesions              Bartholins and Skenes: normal                 Vagina: normal appearing vagina with normal color and discharge, no lesions              Cervix: absent              Pap taken: No. Bimanual Exam:  Uterus:  uterus absent              Adnexa: normal adnexa and no mass, fullness, tenderness              Rectovaginal: Yes.  .  Confirms.              Anus:  normal sphincter tone, no lesions  Chaperone was present for exam.  Assessment:   Well woman visit with normal exam. Status post TVH. Sleep disturbance.  ERT patient.   Plan: Yearly mammogram recommended after age 75.  Recommended self breast exam.  Pap and HR HPV as above. Discussed Calcium, Vitamin D, regular exercise program including cardiovascular and weight bearing exercise. Labs performed.  Yes.  .   See orders. Refills given on medications.  Yes.  .  See orders.  Will wean off Vivelle Dot.  Will bring down to 0.05 mg this year.  Discussed risk of DVT, PE, MI, stroke, breast cancer.  Continue  Prozac 40 mg daily.  Referral to neurology for sleep disturbance.  Follow up annually and prn.     After visit summary provided.

## 2015-06-26 ENCOUNTER — Other Ambulatory Visit: Payer: Self-pay | Admitting: Obstetrics and Gynecology

## 2015-06-26 DIAGNOSIS — E559 Vitamin D deficiency, unspecified: Secondary | ICD-10-CM

## 2015-06-26 LAB — VITAMIN D 25 HYDROXY (VIT D DEFICIENCY, FRACTURES): VIT D 25 HYDROXY: 22 ng/mL — AB (ref 30–100)

## 2015-06-29 ENCOUNTER — Other Ambulatory Visit: Payer: Self-pay | Admitting: *Deleted

## 2015-06-29 MED ORDER — VITAMIN D (ERGOCALCIFEROL) 1.25 MG (50000 UNIT) PO CAPS: 50000.0000 [IU] | ORAL_CAPSULE | ORAL | Status: DC

## 2015-07-06 ENCOUNTER — Other Ambulatory Visit: Payer: Self-pay

## 2015-07-06 DIAGNOSIS — Z1231 Encounter for screening mammogram for malignant neoplasm of breast: Secondary | ICD-10-CM

## 2015-07-20 ENCOUNTER — Ambulatory Visit (INDEPENDENT_AMBULATORY_CARE_PROVIDER_SITE_OTHER): Payer: BLUE CROSS/BLUE SHIELD | Admitting: Neurology

## 2015-07-20 ENCOUNTER — Encounter: Payer: Self-pay | Admitting: Neurology

## 2015-07-20 VITALS — BP 138/82 | HR 86 | Resp 20 | Ht 64.0 in | Wt 145.0 lb

## 2015-07-20 DIAGNOSIS — R6889 Other general symptoms and signs: Secondary | ICD-10-CM | POA: Insufficient documentation

## 2015-07-20 DIAGNOSIS — F5104 Psychophysiologic insomnia: Secondary | ICD-10-CM | POA: Insufficient documentation

## 2015-07-20 DIAGNOSIS — F5102 Adjustment insomnia: Secondary | ICD-10-CM | POA: Diagnosis not present

## 2015-07-20 DIAGNOSIS — G4710 Hypersomnia, unspecified: Secondary | ICD-10-CM

## 2015-07-20 DIAGNOSIS — F518 Other sleep disorders not due to a substance or known physiological condition: Secondary | ICD-10-CM

## 2015-07-20 MED ORDER — MELATONIN 1 MG PO TABS: 3.0000 mg | ORAL_TABLET | Freq: Every evening | ORAL | Status: DC

## 2015-07-20 NOTE — Progress Notes (Signed)
SLEEP MEDICINE CLINIC   Provider:  Larey Seat, M D  Referring Provider: Carlena Hurl, PA-C Primary Care Physician:  Arloa Koh, MD  Chief Complaint  Patient presents with  . New Patient (Initial Visit)    trouble staying asleep at night, tired in the daytime, has nightmares, very restless, rm 10, alone    HPI:  Marissa Lee is a 64 y.o. female , seen here as a referral  from [place] for a sleep evaluation,   Marissa Lee has a past medical history of breast cysts she underwent several biopsies bilaterally all results were benign, she's taking a Vivelle-Dot for hot flashes. She used to be a smoker but quit about 15 years ago she drinks about 0.6 to 1.2 ounces of alcohol per week she has never used illicit drugs. She usually has hypotension, she does not have diabetes or thyroid disease to her knowledge. She takes sometimes Antivert for vertigo and Zofran for nausea .She also has a scopolamine prescription prescribed after a vertigo onset in March 2016 on a cruise. When she had migraines and vertigo about 1 year ago, her circadian rhythm was "upside down" . She still feels she is not back in "normal rhytm".  Marissa Lee reports that she has had changing sleep habits for quite a while and that she had suffered from poor sleep quality. She moved here little over 2 years ago and her sleep hygiene and habits at further changed. She reports sleep habits as follows: She does not have a set bedtime but on average at bedtime is around 10 PM until midnight. Often even when she goes to bed earlier she will not fall asleep before midnight. Then she reports that her sleep is very fragmented. She awakens about every 2 hours not to go to the restroom but spontaneously. She is moving back and forth. He bedroom is cool and quiet and dark. She sleeps alone, with her cat.  Sometimes she wakes out of a dream, vivid and often threatening. 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 Very vivid and live like. She has had dreams like this most of her adult life.   She tried melatonin, which helped her to sleep for 4 hours but was concerned about habit forming effects.   Sleep medical history and family sleep history: no hypersomnia, Insomnia affecting her mother.   Social history: single, one adult daughter , grandchildren. Quit smoking cigarettes in 2005. Socially using ETOH.  Review of Systems: Out of a complete 14 system review, the patient complains of only the following symptoms, and all other reviewed systems are negative. Patient reports fragmented sleep, vivid dreams, excessive daytime sleepiness with an irresistible urge to go to sleep. She has not encountered sleep paralysis, she does not report dream intrusions. She does not report any evidence of acting out dreams. She lives alone and nobody would witness.  Epworth score  9 , Fatigue severity score 43  , depression score; see below.    Social History   Social History  . Marital Status: Single    Spouse Name: N/A  . Number of Children: N/A  . Years of Education: N/A   Occupational History  . Not on file.   Social History Main Topics  . Smoking status: Former Smoker -- 1.00 packs/day for 20 years    Types: Cigarettes  . Smokeless tobacco: Former Systems developer    Quit date: 09/26/2000  . Alcohol Use: 0.6 -  1.2 oz/week    1-2 Standard drinks or equivalent per week     Comment: occasinal  . Drug Use: No  . Sexual Activity: No     Comment: Hyst   Other Topics Concern  . Not on file   Social History Narrative    Family History  Problem Relation Age of Onset  . Thyroid disease Maternal Grandmother     Goiter  . Cancer Paternal Grandmother     Colon   . Cancer Paternal Grandfather     Lung  . Thyroid disease Daughter     Hypothyroid  . Heart disease Mother     valvular disease, valve replacement  . Atrial fibrillation Mother   . Stroke Mother   . Heart disease Brother     valve  disease, valve surgery    Past Medical History  Diagnosis Date  . Endometriosis   . Fibroid   . Hypercholesteremia 2007  . Fibrocystic breast     muliple drainage  . Urge incontinence 06/05/2013    Past Surgical History  Procedure Laterality Date  . Foot surgery  1992  . Wisdom tooth extraction  1978  . Combined hysterectomy vaginal / oophorectomy / a&p repair  2004  . Bladder suspension  2004    done with TVH  . Breast biopsy  1980    done 3x    Current Outpatient Prescriptions  Medication Sig Dispense Refill  . doxycycline (ORACEA) 40 MG capsule Take 40 mg by mouth every morning.    Marland Kitchen estradiol (VIVELLE-DOT) 0.05 MG/24HR patch Place 1 patch (0.05 mg total) onto the skin 2 (two) times a week. 8 patch 11  . FLUoxetine (PROZAC) 40 MG capsule TAKE ONE CAPSULE BY MOUTH EVERY DAY 30 capsule 11  . Vitamin D, Ergocalciferol, (DRISDOL) 50000 UNITS CAPS capsule Take 1 capsule (50,000 Units total) by mouth every 14 (fourteen) days. 6 capsule 0   No current facility-administered medications for this visit.    Allergies as of 07/20/2015 - Review Complete 07/20/2015  Allergen Reaction Noted  . Cephalosporins Swelling 08/05/2012  . Codeine Itching 12/22/2014    Vitals: BP 138/82 mmHg  Pulse 86  Resp 20  Ht 5\' 4"  (1.626 m)  Wt 145 lb (65.772 kg)  BMI 24.88 kg/m2  LMP 09/27/2003 Last Weight:  Wt Readings from Last 1 Encounters:  07/20/15 145 lb (65.772 kg)   JJO:ACZY mass index is 24.88 kg/(m^2).     Last Height:   Ht Readings from Last 1 Encounters:  07/20/15 5\' 4"  (1.626 m)    Physical exam:  General: The patient is awake, alert and appears not in acute distress. The patient is well groomed. Head: Normocephalic, atraumatic. Neck is supple. Mallampati 3 , bruxism .  neck circumference;13.5 . Nasal airflow unrestricted , TMJ  click not evident . Retrognathia is not seen.  Cardiovascular:  Regular rate and rhythm, without  murmurs or carotid bruit, and without distended  neck veins. Respiratory: Lungs are clear to auscultation. Skin:  Without evidence of edema, or rash Trunk: BMI is. The patient's posture is erect   Neurologic exam : The patient is awake and alert, oriented to place and time.   Memory subjective  described as intact.  Attention span & concentration ability appears normal.  Speech is fluent,  without  dysarthria, dysphonia or aphasia.  Mood and affect are appropriate.  Cranial nerves: Pupils are equal and briskly reactive to light. Funduscopic exam without  evidence of pallor or edema.  Extraocular  movements  in vertical and horizontal planes intact and without nystagmus. Visual fields by finger perimetry are intact. Hearing to finger rub intact.   Facial sensation intact to fine touch.  Facial motor strength is symmetric and tongue and uvula move midline. Shoulder shrug was symmetrical.   Motor exam: Normal tone, muscle bulk and symmetric strength in all extremities.  Sensory:  Fine touch, pinprick and vibration were tested in all extremities. Proprioception tested in the upper extremities was normal.  Coordination: Rapid alternating movements in the fingers/hands was normal. Finger-to-nose maneuver  normal without evidence of ataxia, dysmetria or tremor.  Gait and station: Patient walks without assistive device and is able unassisted to climb up to the exam table. Strength within normal limits.  Stance is stable and normal. Toe and heel stand were tested.Tandem gait is unfragmented.  Turns with 3  Steps. Romberg testing is negative.  Deep tendon reflexes: in the  upper and lower extremities are symmetric and intact. Babinski maneuver response is   downgoing.  The patient was advised of the nature of the diagnosed sleep disorder , the treatment options and risks for general a health and wellness arising from not treating the condition.  I spent more than 40 minutes of face to face time with the patient. Greater than 50% of time was  spent in counseling and coordination of care. We have discussed the diagnosis and differential and I answered the patient's questions.     Assessment:  After physical and neurologic examination, review of laboratory studies,  Personal review of imaging studies, reports of other /same  Imaging studies ,  Results of polysomnography/ neurophysiology testing and pre-existing records as far as provided in visit., my assessment is   1) Marissa Lee only endorsed 9 points on the Epworth sleepiness score yet 43 points on the fatigue severity scale. She reports an irresistible urge to go to sleep especially in the afternoon. When she wakes up from her nocturnal sleep she is not necessarily refreshed or restored and an craves to sleep longer. Her sleep is extremely fragmented by dreams. All this could lead to the assumption that she may have narcolepsy but she denies sleep paralysis, dream intrusion. She has not experienced cataplectic attacks either.  During a recent occasion at the beach where she shared her cartilage with several friends nobody noticed  snoring or apnea. She found herself not able to sleep through the night just as she does at home.      Plan:  Treatment plan and additional workup :  Excessive fatigue, excessive test of daytime sleepiness, nocturnal insomnia was fragmented sleep, but apparently unrelated to snoring or apnea. Nocturia at the most once many nights none. No RLS.   Sleep study to evaluate for organic sleep disorders. I will order an attended split study to evaluate for organic sleep disorder such as periodic limb movements, sleep hypoxemia, or even central apneas that may not be accompanied by snoring. A review of her medication does not give me any evidence that she would be excessively daytime sleepy induced by medication. The main problem seems to be insomnia with an inability to sleep through the night. She had a temporary positive effect for melatonin which allowed her to  sleep 4 hours en bloc instead of 2. I would like for the patient to try Belsomra, which is in non-habit-forming sleep aid. She also reports that her Vivelle-Dot was just cut down from 0.1-0.05 mg, she has not had any recurrence of hot flashes.  Rv after sleep study .   Marissa Partridge Asher Babilonia MD  07/20/2015   CC: Carlena Hurl, [place],  [id number]

## 2015-07-20 NOTE — Patient Instructions (Signed)
Hypersomnia Hypersomnia is when you feel extremely tired during the day even though you're getting plenty of sleep at night. You may need to take naps during the day, and you may also be extremely difficult to wake up when you are sleeping.  CAUSES  The cause of your hypersomnia may not be known. Hypersomnia may be caused by:   Medicines.  Sleep disorders, such as narcolepsy.  Trauma or injury to your head or nervous system.  Using drugs or alcohol.  Tumors.  Medical conditions, such as depression or hypothyroidism.  Genetics. SIGNS AND SYMPTOMS  The main symptoms of hypersomnia include:   Feeling extremely tired throughout the day.  Being very difficult to wake up.  Sleeping for longer and longer periods.  Taking naps throughout the day. Other symptoms may include:   Feeling:  Restless.  Annoyed.  Anxious.  Low energy.  Having difficulty:  Remembering.  Speaking.  Thinking.  Losing your appetite.  Experiencing hallucinations. DIAGNOSIS  Hypersomnia may be diagnosed by:  Medical history and physical exam. This will include a sleep history.  Completing sleep logs.  Tests may also be done, such as:  Polysomnography.  Multiple sleep latency test (MSLT). TREATMENT  There is no cure for hypersomnia, but treatment can be very effective in helping manage the condition. Treatment may include:  Lifestyle and sleeping strategies to help cope with the condition.  Stimulant medicines.  Treating any underlying causes of hypersomnia. HOME CARE INSTRUCTIONS  Take medicines only as directed by your health care provider.  Schedule short naps for when you feel sleepiest during the day. Tell your employer or teachers that you have hypersomnia. You may be able to adjust your schedule to include time for naps.  Avoid drinking alcohol or caffeinated beverages.  Do not eat a heavy meal before bedtime. Eat at about the same times every day.  Do not drive or  operate heavy machinery if you are sleepy.  Do not swim or go out on the water without a life jacket.  If possible, adjust your schedule so that you do not have to work or be active at night.  Keep all follow-up visits as directed by your health care provider. This is important. SEEK MEDICAL CARE IF:   You have new symptoms.  Your symptoms get worse. SEEK IMMEDIATE MEDICAL CARE IF:  You have serious thoughts of hurting yourself or someone else.   This information is not intended to replace advice given to you by your health care provider. Make sure you discuss any questions you have with your health care provider.   Document Released: 09/02/2002 Document Revised: 10/03/2014 Document Reviewed: 04/17/2014 Elsevier Interactive Patient Education 2016 [employer]vier Inc.  

## 2015-08-05 ENCOUNTER — Telehealth: Payer: Self-pay | Admitting: Neurology

## 2015-08-05 DIAGNOSIS — G4710 Hypersomnia, unspecified: Secondary | ICD-10-CM

## 2015-08-05 NOTE — Telephone Encounter (Signed)
[employer] [place] denied the split sleep study.  If you want to place an order for HST then I will see if I can get it authorized.

## 2015-08-06 NOTE — Telephone Encounter (Signed)
Spoke to Dr. Brett Fairy. She said to order an HST for pt.

## 2015-08-17 ENCOUNTER — Ambulatory Visit
Admission: RE | Admit: 2015-08-17 | Discharge: 2015-08-17 | Disposition: A | Discharge status: Home / Self Care | Payer: BLUE CROSS/BLUE SHIELD | Source: Ambulatory Visit

## 2015-08-17 ENCOUNTER — Ambulatory Visit: Payer: BLUE CROSS/BLUE SHIELD

## 2015-08-17 DIAGNOSIS — Z1231 Encounter for screening mammogram for malignant neoplasm of breast: Secondary | ICD-10-CM

## 2015-09-18 ENCOUNTER — Other Ambulatory Visit: Payer: Self-pay | Admitting: Obstetrics and Gynecology

## 2015-09-18 NOTE — Telephone Encounter (Signed)
Medication refill request: Vit D Last AEX:  06/25/15 Dr. Quincy Simmonds  Next AEX: 06/30/16 Dr. Quincy Simmonds Last MMG (if hormonal medication request): 08/19/15 [id number] Refill authorized: 06/29/15 #[id number]. Today please advise.  Routed to [facility]

## 2015-09-29 ENCOUNTER — Other Ambulatory Visit: Payer: BLUE CROSS/BLUE SHIELD

## 2015-09-30 ENCOUNTER — Other Ambulatory Visit: Payer: BLUE CROSS/BLUE SHIELD

## 2015-10-01 ENCOUNTER — Other Ambulatory Visit: Payer: BLUE CROSS/BLUE SHIELD

## 2015-10-01 ENCOUNTER — Other Ambulatory Visit (INDEPENDENT_AMBULATORY_CARE_PROVIDER_SITE_OTHER): Payer: BLUE CROSS/BLUE SHIELD

## 2015-10-01 DIAGNOSIS — E559 Vitamin D deficiency, unspecified: Secondary | ICD-10-CM

## 2015-10-02 LAB — VITAMIN D 25 HYDROXY (VIT D DEFICIENCY, FRACTURES): VIT D 25 HYDROXY: 38 ng/mL (ref 30–100)

## 2015-10-05 ENCOUNTER — Telehealth: Payer: Self-pay

## 2015-10-05 NOTE — Telephone Encounter (Signed)
Spoke with patient. Results given as seen below from [place]. Patient is agreeable and verbalizes understanding.  Routing to provider for final review. Patient agreeable to disposition. Will close encounter.

## 2015-10-05 NOTE — Telephone Encounter (Signed)
-----   Message from Nunzio Cobbs, MD sent at 10/03/2015  3:03 PM EST ----- Please report to patient her normal vit D level.  I recommend she now start OTC vit D 1000 IU daily.

## 2016-01-28 ENCOUNTER — Ambulatory Visit (HOSPITAL_COMMUNITY)
Admission: EM | Admit: 2016-01-28 | Discharge: 2016-01-28 | Disposition: A | Discharge status: Nursing Home (Includes ICF) | Payer: BLUE CROSS/BLUE SHIELD | Source: Home / Self Care | Attending: Emergency Medicine | Admitting: Emergency Medicine

## 2016-01-28 ENCOUNTER — Emergency Department (HOSPITAL_COMMUNITY): Payer: BLUE CROSS/BLUE SHIELD

## 2016-01-28 ENCOUNTER — Encounter (HOSPITAL_COMMUNITY): Payer: Self-pay | Admitting: Emergency Medicine

## 2016-01-28 ENCOUNTER — Observation Stay (HOSPITAL_COMMUNITY)
Admission: EM | Admit: 2016-01-28 | Discharge: 2016-01-29 | Disposition: A | Discharge status: Home / Self Care | Payer: BLUE CROSS/BLUE SHIELD | Attending: Internal Medicine | Admitting: Internal Medicine

## 2016-01-28 ENCOUNTER — Encounter (HOSPITAL_COMMUNITY): Payer: Self-pay

## 2016-01-28 DIAGNOSIS — R0789 Other chest pain: Secondary | ICD-10-CM | POA: Diagnosis not present

## 2016-01-28 DIAGNOSIS — Z885 Allergy status to narcotic agent status: Secondary | ICD-10-CM | POA: Insufficient documentation

## 2016-01-28 DIAGNOSIS — R079 Chest pain, unspecified: Secondary | ICD-10-CM | POA: Diagnosis present

## 2016-01-28 DIAGNOSIS — Z79890 Hormone replacement therapy (postmenopausal): Secondary | ICD-10-CM | POA: Diagnosis not present

## 2016-01-28 DIAGNOSIS — Z87891 Personal history of nicotine dependence: Secondary | ICD-10-CM | POA: Insufficient documentation

## 2016-01-28 LAB — BASIC METABOLIC PANEL
ANION GAP: 12 (ref 5–15)
BUN: 10 mg/dL (ref 6–20)
CHLORIDE: 103 mmol/L (ref 101–111)
CO2: 24 mmol/L (ref 22–32)
Calcium: 9.2 mg/dL (ref 8.9–10.3)
Creatinine, Ser: 0.79 mg/dL (ref 0.44–1.00)
GFR calc non Af Amer: >60 mL/min (ref >60)
Glucose, Bld: 89 mg/dL (ref 65–99)
POTASSIUM: 3.9 mmol/L (ref 3.5–5.1)
Sodium: 139 mmol/L (ref 135–145)

## 2016-01-28 LAB — CBC
HCT: 41.8 % (ref 36.0–46.0)
HEMOGLOBIN: 13.5 g/dL (ref 12.0–15.0)
MCH: 31.1 pg (ref 26.0–34.0)
MCHC: 32.3 g/dL (ref 30.0–36.0)
MCV: 96.3 fL (ref 78.0–100.0)
Platelets: 301 10*3/uL (ref 150–400)
RBC: 4.34 MIL/uL (ref 3.87–5.11)
RDW: 13.1 % (ref 11.5–15.5)
WBC: 6.3 10*3/uL (ref 4.0–10.5)

## 2016-01-28 LAB — I-STAT TROPONIN, ED: Troponin i, poc: 0 ng/mL (ref 0.00–0.08)

## 2016-01-28 MED ORDER — ASPIRIN 81 MG PO CHEW
324.0000 mg | CHEWABLE_TABLET | Freq: Once | ORAL | Status: AC
  Administered 2016-01-28: 324 mg via ORAL

## 2016-01-28 MED ORDER — SODIUM CHLORIDE 0.9 % IV SOLN
Freq: Once | INTRAVENOUS | Status: AC
  Administered 2016-01-28: 18:00:00 via INTRAVENOUS

## 2016-01-28 MED ORDER — ASPIRIN 81 MG PO CHEW
CHEWABLE_TABLET | ORAL | Status: AC
  Filled 2016-01-28: qty 4

## 2016-01-28 MED ORDER — NITROGLYCERIN 0.4 MG SL SUBL
SUBLINGUAL_TABLET | SUBLINGUAL | Status: AC
  Filled 2016-01-28: qty 1

## 2016-01-28 MED ORDER — NITROGLYCERIN 0.4 MG SL SUBL: 0.4000 mg | SUBLINGUAL_TABLET | SUBLINGUAL | Status: DC | PRN

## 2016-01-28 NOTE — ED Notes (Signed)
Patient's daughter arrived in department.  Patient is very firm she wants her daughter to take her to the emergency department.  Dr Bridgett Larsson aware and agreeable.  Patient denies chest pain.  Dr Bridgett Larsson aware.

## 2016-01-28 NOTE — ED Notes (Addendum)
Pt sent here from [facility] for left sided sharp chest pain earlier today but the CP went away around 3pm. Denies current CP/SOB.   Comment about needing MRI for back was charted in error.

## 2016-01-28 NOTE — ED Provider Notes (Signed)
CSN: [id number]     Arrival date & time 01/28/16  1828 History   First MD Initiated Contact with Patient 01/28/16 2200     Chief Complaint  Patient presents with  . Chest Pain     (Consider location/radiation/quality/duration/timing/severity/associated sxs/prior Treatment) HPI  Arloa Koh, MD - PCP Patient presents to the ER with PMH of endometriosis, hypercholesterolemia, family hx of heart disease, hx of smoking, hormone therapy, age.   She was sent here form the [facility] and had declined EMS. She was given aspirin and IV start there. The patient has never had CP and last had a heart test > 5 years ago which she was told was normal. She states she had an episode of pain 10 days ago that lasted approx 45 minutes. She has not had an episode since then but today had an episode that lasted 1.5 hours and worsened with exertion while playing with her grand children. She did has associated numbness sensation down her left arm. No N/V or diaphoresis, no dizziness, palpitations, SOB, le swelling. Currently denies pain.  Past Medical History  Diagnosis Date  . Endometriosis   . Fibroid   . Hypercholesteremia 2007  . Fibrocystic breast     muliple drainage  . Urge incontinence 06/05/2013   Past Surgical History  Procedure Laterality Date  . Foot surgery  1992  . Wisdom tooth extraction  1978  . Combined hysterectomy vaginal / oophorectomy / a&p repair  2004  . Bladder suspension  2004    done with TVH  . Breast biopsy  1980    done 3x   Family History  Problem Relation Age of Onset  . Thyroid disease Maternal Grandmother     Goiter  . Cancer Paternal Grandmother     Colon   . Cancer Paternal Grandfather     Lung  . Thyroid disease Daughter     Hypothyroid  . Heart disease Mother     valvular disease, valve replacement  . Atrial fibrillation Mother   . Stroke Mother   . Heart disease Brother     valve disease, valve surgery   Social History  Substance Use Topics  . Smoking  status: Former Smoker -- 1.00 packs/day for 20 years    Types: Cigarettes  . Smokeless tobacco: Former Systems developer    Quit date: 09/26/2000  . Alcohol Use: 0.6 - 1.2 oz/week    1-2 Standard drinks or equivalent per week     Comment: occasinal   OB History    Gravida Para Term Preterm AB TAB SAB Ectopic Multiple Living   5 2 2  3  3   2      Review of Systems  Review of Systems All other systems negative except as documented in the HPI. All pertinent positives and negatives as reviewed in the HPI.   Allergies  Cephalosporins and Codeine  Home Medications   Prior to Admission medications   Medication Sig Start Date End Date Taking? Authorizing Provider  calcium carbonate (OSCAL) 1500 (600 Ca) MG TABS tablet Take 600 mg of elemental calcium by mouth daily with breakfast.   Yes Historical Provider, MD  doxycycline (ORACEA) 40 MG capsule Take 40 mg by mouth every morning.   Yes Historical Provider, MD  estradiol (VIVELLE-DOT) 0.05 MG/24HR patch Place 1 patch (0.05 mg total) onto the skin 2 (two) times a week. 06/25/15  Yes Brook E Yisroel Ramming, MD  FLUoxetine (PROZAC) 40 MG capsule TAKE ONE CAPSULE BY MOUTH EVERY  DAY 06/25/15  Yes [place], MD  Multiple Vitamin (MULTIVITAMIN WITH MINERALS) TABS tablet Take 1 tablet by mouth daily.   Yes Historical Provider, MD  Melatonin 1 MG TABS Take 3 tablets (3 mg total) by mouth Nightly. Patient not taking: Reported on 01/28/2016 07/20/15   Larey Seat, MD  Vitamin D, Ergocalciferol, (DRISDOL) 50000 UNITS CAPS capsule TAKE 1 CAPSULE BY MOUTH EVERY 14 DAYS Patient not taking: Reported on 01/28/2016 09/18/15   Megan Salon, MD   BP 133/62 mmHg  Pulse 58  Temp(Src) 97.7 F (36.5 C) (Oral)  Resp 10  SpO2 100%  LMP 09/27/2003 Physical Exam  Constitutional: She appears well-developed and well-nourished. No distress.  HENT:  Head: Normocephalic and atraumatic.  Right Ear: Tympanic membrane and ear canal normal.  Left Ear: Tympanic  membrane and ear canal normal.  Nose: Nose normal.  Mouth/Throat: Uvula is midline, oropharynx is clear and moist and mucous membranes are normal.  Eyes: Pupils are equal, round, and reactive to light.  Neck: Normal range of motion. Neck supple.  Cardiovascular: Normal rate and regular rhythm.   Pulmonary/Chest: Effort normal and breath sounds normal. She has no decreased breath sounds. She has no wheezes. She has no rhonchi. She exhibits tenderness.    Abdominal: Soft.  No signs of abdominal distention  Musculoskeletal:  No LE swelling  Neurological: She is alert.  Acting at baseline  Skin: Skin is warm and dry. No rash noted.  Nursing note and vitals reviewed.   ED Course  Procedures (including critical care time) Labs Review Labs Reviewed  BASIC METABOLIC PANEL  CBC  TROPONIN I  I-STAT TROPOININ, ED  Randolm Idol, ED    Imaging Review Dg Chest 2 View  01/28/2016  CLINICAL DATA:  Chest pain starting 2 weeks ago EXAM: CHEST  2 VIEW COMPARISON:  08/30/2012 FINDINGS: Cardiomediastinal silhouette is stable. No acute infiltrate or pleural effusion. No pulmonary edema. Bony thorax is unremarkable. IMPRESSION: No active cardiopulmonary disease. Electronically Signed   By: Lahoma Crocker M.D.   On: 01/28/2016 19:26   I have personally reviewed and evaluated these images and lab results as part of my medical decision-making.   EKG Interpretation   Date/Time:  Thursday Jan 28 2016 18:34:54 EDT Ventricular Rate:  62 PR Interval:  158 QRS Duration: 76 QT Interval:  444 QTC Calculation: 450 R Axis:   -4 Text Interpretation:  Normal sinus rhythm Normal ECG unchanged Normal ECG  Confirmed by Carmin Muskrat  MD ([id number]) on 01/28/2016 10:39:03 PM      MDM   Final diagnoses:  Chest pain, unspecified chest pain type    Discussed case with Dr. Vanita Panda, pt will likely require admit as obs for CP r/o. Chest xray unremarkable, first trop negative, Per Dr. Vanita Panda the EKGs are not  concerning or changed from prior. Pt currently pain free.  Admit to [facility] admits, Dr. Alcario Drought, Tele, obs, [facility] for CP r/o.   Delos Haring, PA-C 01/29/16 0013  Carmin Muskrat, MD 01/29/16 2133

## 2016-01-28 NOTE — ED Provider Notes (Addendum)
CSN: [id number]     Arrival date & time 01/28/16  1632 History   First MD Initiated Contact with Patient 01/28/16 1709     Chief Complaint  Patient presents with  . Chest Pain   (Consider location/radiation/quality/duration/timing/severity/associated sxs/prior Treatment) HPI She is a 65 year old woman here for evaluation of chest pain. She states she had an episode of pain about 10 days ago that lasted for about an hour. She had a recurrence of the pain today at 1:30. She states the pain starts in her left anterior chest and radiates up to her collarbone. It is described as sharp. It will sometimes go into the left upper arm. It was not triggered by exertion, but did seem to get worse when she climbed up into a playground with her grandchildren.  She continues to have some left anterior chest pain, but states the pain in the upper chest has resolved. No associated dizziness, palpitations, nausea, or shortness of breath.  No personal history of diabetes or hypertension. She does have family history of valvular heart disease. She has been on hormone replacement therapy since her hysterectomy 13 years ago. Her physician has started weaning her off this medication, but she is still currently using it. She has not taken any medications other than her fluoxetine today.  Past Medical History  Diagnosis Date  . Endometriosis   . Fibroid   . Hypercholesteremia 2007  . Fibrocystic breast     muliple drainage  . Urge incontinence 06/05/2013   Past Surgical History  Procedure Laterality Date  . Foot surgery  1992  . Wisdom tooth extraction  1978  . Combined hysterectomy vaginal / oophorectomy / a&p repair  2004  . Bladder suspension  2004    done with TV[facility]  . Breast biopsy  1980    done 3x   Family History  Problem Relation Age of Onset  . Thyroid disease Maternal Grandmother     Goiter  . Cancer Paternal Grandmother     Colon   . Cancer Paternal Grandfather     Lung  . Thyroid disease  Daughter     Hypothyroid  . Heart disease Mother     valvular disease, valve replacement  . Atrial fibrillation Mother   . Stroke Mother   . Heart disease Brother     valve disease, valve surgery   Social History  Substance Use Topics  . Smoking status: Former Smoker -- 1.00 packs/day for 20 years    Types: Cigarettes  . Smokeless tobacco: Former Systems developer    Quit date: 09/26/2000  . Alcohol Use: 0.6 - 1.2 oz/week    1-2 Standard drinks or equivalent per week     Comment: occasinal   OB History    Gravida Para Term Preterm AB TAB SAB Ectopic Multiple Living   5 2 2  3  3   2      Review of Systems As in history of present illness Allergies  Cephalosporins and Codeine  Home Medications   Prior to Admission medications   Medication Sig Start Date End Date Taking? Authorizing Provider  doxycycline (ORACEA) 40 MG capsule Take 40 mg by mouth every morning.    Historical Provider, MD  estradiol (VIVELLE-DOT) 0.05 MG/24HR patch Place 1 patch (0.05 mg total) onto the skin 2 (two) times a week. 06/25/15   Nunzio Cobbs, MD  FLUoxetine (PROZAC) 40 MG capsule TAKE ONE CAPSULE BY MOUTH EVERY DAY 06/25/15   Nunzio Cobbs,  MD  Melatonin 1 MG TABS Take 3 tablets (3 mg total) by mouth Nightly. 07/20/15   Asencion Partridge Dohmeier, MD  Vitamin D, Ergocalciferol, (DRISDOL) 50000 UNITS CAPS capsule TAKE 1 CAPSULE BY MOUTH EVERY 14 DAYS 09/18/15   Megan Salon, MD   Meds Ordered and Administered this Visit   Medications  aspirin chewable tablet 324 mg (324 mg Oral Given 01/28/16 1820)  0.9 %  sodium chloride infusion ( Intravenous New Bag/Given 01/28/16 1823)    BP 126/66 mmHg  Pulse 61  Temp(Src) 97.9 F (36.6 C) (Oral)  Resp 16  SpO2 99%  LMP 09/27/2003 No data found.   Physical Exam  Constitutional: She is oriented to person, place, and time. She appears well-developed and well-nourished. No distress.  Neck: Neck supple.  Cardiovascular: Normal rate, regular rhythm and  normal heart sounds.   No murmur heard. Pulmonary/Chest: Effort normal and breath sounds normal. No respiratory distress. She has no wheezes. She has no rales. She exhibits tenderness (she does have some tenderness to palpation in theleft anterior ribs, but this is different than the pain she is experiencing.).  Neurological: She is alert and oriented to person, place, and time.    ED Course  Procedures (including critical care time) ED ECG REPORT   Date: 01/28/2016  Rate: 60  Rhythm: normal sinus rhythm  QRS Axis: left  Intervals: normal  ST/T Wave abnormalities: normal  Conduction Disutrbances:none  Narrative Interpretation: NSR with new left axis deviation  Old EKG Reviewed: changes noted  I have personally reviewed the EKG tracing and agree with the computerized printout as noted.  Labs Review Labs Reviewed - No data to display  Imaging Review No results found.    MDM   1. Chest pain, unspecified chest pain type    Patient does have risk factors, particularly hormone replacement therapy, age and smoking history.  History is somewhat concerning with pain worsening with exertion. We'll give aspirin here. IV started.  When RN went to administer nitroglycerin, patient adamantly denied any active pain. Nitroglycerin canceled. Had extensive discussion with patient regarding mode of transport to ED. She really does not want to take an ambulance. Her daughter will be here shortly. We will monitor her here and if she remains stable after the medications, her daughter can transport her to the emergency room.    Melony Overly, MD 01/28/16 [place], MD 01/28/16 [phone]

## 2016-01-28 NOTE — ED Notes (Signed)
Patient reports an episode of chest pain today.  Patient said about a week ago had a similar episode, very brief.  Today, pain started around 1:30 pm.  No nausea, no vomiting, no sob, no diaphoresis.  Discomfort is not as intense as earlier today.  Initially pain was in left breast, left shoulder, left shoulder blade.  Pain described as shooting upward, into shoulder blade area.

## 2016-01-28 NOTE — ED Notes (Signed)
MD at bedside. 

## 2016-01-28 NOTE — ED Notes (Signed)
[facility] gave pt 4 baby aspirin PTA

## 2016-01-29 ENCOUNTER — Observation Stay (HOSPITAL_BASED_OUTPATIENT_CLINIC_OR_DEPARTMENT_OTHER): Payer: BLUE CROSS/BLUE SHIELD

## 2016-01-29 ENCOUNTER — Observation Stay (HOSPITAL_COMMUNITY): Payer: BLUE CROSS/BLUE SHIELD

## 2016-01-29 ENCOUNTER — Encounter (HOSPITAL_COMMUNITY): Payer: Self-pay | Admitting: *Deleted

## 2016-01-29 DIAGNOSIS — R079 Chest pain, unspecified: Secondary | ICD-10-CM

## 2016-01-29 DIAGNOSIS — I340 Nonrheumatic mitral (valve) insufficiency: Secondary | ICD-10-CM

## 2016-01-29 LAB — NM MYOCAR SINGLE W/SPECT
CHL CUP MPHR: 157 {beats}/min
CHL CUP NUCLEAR SSS: 12
CHL RATE OF PERCEIVED EXERTION: 15
CSEPEW: 13 METS
CSEPHR: 94 %
CSEPPHR: 148 {beats}/min
Exercise duration (min): 10 min
Exercise duration (sec): 6 s
LHR: 0.18
LVDIAVOL: 53 mL (ref 46–106)
LVSYSVOL: 19 mL
Rest HR: 66 {beats}/min
SDS: 4
SRS: 10
TID: 0.88

## 2016-01-29 LAB — ECHOCARDIOGRAM COMPLETE: WEIGHTICAEL: 2275.15 [oz_av]

## 2016-01-29 LAB — TROPONIN I

## 2016-01-29 LAB — I-STAT TROPONIN, ED: TROPONIN I, POC: 0 ng/mL (ref 0.00–0.08)

## 2016-01-29 LAB — D-DIMER, QUANTITATIVE: D-Dimer, Quant: <0.27 ug/mL-FEU (ref 0.00–0.50)

## 2016-01-29 MED ORDER — DOXYCYCLINE HYCLATE 50 MG PO CAPS
50.0000 mg | ORAL_CAPSULE | Freq: Every day | ORAL | Status: DC
  Filled 2016-01-29: qty 1

## 2016-01-29 MED ORDER — ENOXAPARIN SODIUM 40 MG/0.4ML ~~LOC~~ SOLN: 40.0000 mg | SUBCUTANEOUS | Status: DC

## 2016-01-29 MED ORDER — ACETAMINOPHEN 325 MG PO TABS: 650.0000 mg | ORAL_TABLET | ORAL | Status: DC | PRN

## 2016-01-29 MED ORDER — DOXYCYCLINE 40 MG PO CPDR: 40.0000 mg | DELAYED_RELEASE_CAPSULE | ORAL | Status: DC

## 2016-01-29 MED ORDER — TECHNETIUM TC 99M SESTAMIBI - CARDIOLITE
30.0000 | Freq: Once | INTRAVENOUS | Status: AC | PRN
  Administered 2016-01-29: 30 via INTRAVENOUS

## 2016-01-29 MED ORDER — CALCIUM CARBONATE 1250 (500 CA) MG PO TABS
500.0000 mg | ORAL_TABLET | Freq: Every day | ORAL | Status: DC
  Administered 2016-01-29: 500 mg via ORAL
  Filled 2016-01-29: qty 1

## 2016-01-29 MED ORDER — TECHNETIUM TC 99M SESTAMIBI - CARDIOLITE
10.0000 | Freq: Once | INTRAVENOUS | Status: AC | PRN
  Administered 2016-01-29: 10 via INTRAVENOUS

## 2016-01-29 MED ORDER — REGADENOSON 0.4 MG/5ML IV SOLN
0.4000 mg | Freq: Once | INTRAVENOUS | Status: DC
  Filled 2016-01-29: qty 5

## 2016-01-29 MED ORDER — CALCIUM CARBONATE 1500 (600 CA) MG PO TABS
600.0000 mg | ORAL_TABLET | Freq: Every day | ORAL | Status: DC
  Filled 2016-01-29: qty 1

## 2016-01-29 MED ORDER — FLUOXETINE HCL 20 MG PO CAPS
40.0000 mg | ORAL_CAPSULE | Freq: Every day | ORAL | Status: DC
  Administered 2016-01-29: 40 mg via ORAL
  Filled 2016-01-29: qty 2

## 2016-01-29 MED ORDER — ONDANSETRON HCL 4 MG/2ML IJ SOLN: 4.0000 mg | Freq: Four times a day (QID) | INTRAMUSCULAR | Status: DC | PRN

## 2016-01-29 NOTE — Progress Notes (Signed)
Chaplain presented tot the patient's room for pastoral visitation.  The patient was pleasant and welcoming for this Chaplain visit, who inquired of her overall wellbeing.  She was appreciative of the visit, welcomed prayer, and encouragement. Chaplain will continue to follow up as needed. Chaplain Yaakov Guthrie [phone]

## 2016-01-29 NOTE — Progress Notes (Signed)
1 day treadmill nuclear stress test completed, pending final result by [facility] reader  Signed, Almyra Deforest PA Pager: [phone]

## 2016-01-29 NOTE — Progress Notes (Signed)
Patient states not on Doxycycline at home. Patient nurse, Ronalee Belts called and notified to evaluate with her doctor as per request of Janan Ridge PA.

## 2016-01-29 NOTE — Discharge Summary (Signed)
Physician Discharge Summary  Marissa Lee [id number] DOB: 1950/12/09 DOA: 01/28/2016  PCP: Arloa Koh, MD  Admit date: 01/28/2016 Discharge date: 01/29/2016  Time spent: 60 minutes  Recommendations for Outpatient Follow-up:  1. Patient is to pick a PCP and follow-up with PCP in 2 weeks.   Discharge Diagnoses:  Active Problems:   Chest pain   Discharge Condition: Stable and improved  Diet recommendation: Regular  Filed Weights   01/29/16 0142  Weight: 64.5 kg (142 lb 3.2 oz)    History of present illness:  Per Dr Larae Grooms is a 65 y.o. female with medical history significant of HLC, smoking. Patient presented to the ED with c/o CP. Pain was located in L chest, radiation to L arm. Had 45 min episode a couple of days ago and then a 1.5 hr episode on the day of admission. Symptoms worse with exertion, better on rest. No N/V, dizziness, palpitations, SOB. Currently pain free.    Hospital Course:  1 Chest pain Patient was admitted with substernal chest pain to the left upper extremity which lasted an hour. Patient was admitted to telemetry and cardiac enzymes will cycle 3. 2-D echo was done with a EF of 55-60% with no wall motion abnormalities and grade 2 diastolic dysfunction. Patient underwent a Myoview stress test which was a low risk study with no ST segment deviation during stress, no T wave inversion during stress, normal study with a EF of 55-65%. Excellent exercise capacity. D-dimer was obtained which was negative. Patient remained chest pain-free throughout the hospitalization and patient be discharged in stable and improved condition. Cardiology followed the patient's during the hospitalization. Patient is to follow-up with PCP as outpatient.    Procedures:  2-D echo 01/29/2016  Myoview stress test 01/29/2016  Chest x-ray 01/28/2016  Consultations:  Cardiology  Discharge Exam: Filed Vitals:   01/29/16 1049 01/29/16 1305  BP: 146/76 108/52   Pulse:  67  Temp:  97.7 F (36.5 C)  Resp:  20    General: NAD Cardiovascular: RRR Respiratory: CTAB  Discharge Instructions   Discharge Instructions    Diet general    Complete by:  As directed      Discharge instructions    Complete by:  As directed   Follow up with PCP in 2 weeks.     Increase activity slowly    Complete by:  As directed           Current Discharge Medication List    CONTINUE these medications which have NOT CHANGED   Details  calcium carbonate (OSCAL) 1500 (600 Ca) MG TABS tablet Take 600 mg of elemental calcium by mouth daily with breakfast.    estradiol (VIVELLE-DOT) 0.05 MG/24HR patch Place 1 patch (0.05 mg total) onto the skin 2 (two) times a week. Qty: 8 patch, Refills: 11    FLUoxetine (PROZAC) 40 MG capsule TAKE ONE CAPSULE BY MOUTH EVERY DAY Qty: 30 capsule, Refills: 11    Multiple Vitamin (MULTIVITAMIN WITH MINERALS) TABS tablet Take 1 tablet by mouth daily.      STOP taking these medications     doxycycline (ORACEA) 40 MG capsule        Allergies  Allergen Reactions  . Cephalosporins Swelling    Throat swells  . Codeine Itching   Follow-up Information    Follow up with [facility].   Contact information:   call [phone]- for physician referral list assistance in finding a primary care doctor  Schedule an appointment as soon as possible for a visit in 2 weeks to follow up.   Why:  f/u with PCP in 2 weeks.       The results of significant diagnostics from this hospitalization (including imaging, microbiology, ancillary and laboratory) are listed below for reference.    Significant Diagnostic Studies: Dg Chest 2 View  01/28/2016  CLINICAL DATA:  Chest pain starting 2 weeks ago EXAM: CHEST  2 VIEW COMPARISON:  08/30/2012 FINDINGS: Cardiomediastinal silhouette is stable. No acute infiltrate or pleural effusion. No pulmonary edema. Bony thorax is unremarkable. IMPRESSION: No active cardiopulmonary disease.  Electronically Signed   By: Lahoma Crocker M.D.   On: 01/28/2016 19:26   Nm Myocar Single W/spect W/wall Motion And Ef  01/29/2016   There was no ST segment deviation noted during stress.  No T wave inversion was noted during stress.  The study is normal.  This is a low risk study.  The left ventricular ejection fraction is normal (55-65%).  Normal exercise treadmill stress test with no evidence of prior infarct or ischemia. Excellent exercise capacity. Normal LVEF.    Microbiology: No results found for this or any previous visit (from the past 240 hour(s)).   Labs: Basic Metabolic Panel:  Recent Labs Lab 01/28/16 1842  NA 139  K 3.9  CL 103  CO2 24  GLUCOSE 89  BUN 10  CREATININE 0.79  CALCIUM 9.2   Liver Function Tests: No results for input(s): AST, ALT, ALKPHOS, BILITOT, PROT, ALBUMIN in the last 168 hours. No results for input(s): LIPASE, AMYLASE in the last 168 hours. No results for input(s): AMMONIA in the last 168 hours. CBC:  Recent Labs Lab 01/28/16 1842  WBC 6.3  HGB 13.5  HCT 41.8  MCV 96.3  PLT 301   Cardiac Enzymes:  Recent Labs Lab 01/29/16 0255 01/29/16 0737  TROPONINI <0.03 <0.03   BNP: BNP (last 3 results) No results for input(s): BNP in the last 8760 hours.  ProBNP (last 3 results) No results for input(s): PROBNP in the last 8760 hours.  CBG: No results for input(s): GLUCAP in the last 168 hours.     SignedIrine Seal MD.  [facility] 01/29/2016, 5:28 PM

## 2016-01-29 NOTE — Progress Notes (Signed)
*  PRELIMINARY RESULTS* Echocardiogram 2D Echocardiogram has been performed.  Leavy Cella 01/29/2016, 3:36 PM

## 2016-01-29 NOTE — H&P (Signed)
History and Physical    Marissa Lee [id number] DOB: Nov 23, 1950 DOA: 01/28/2016  Referring MD/NP/PA: Delos Haring, PA-C PCP: Arloa Koh, MD Outpatient Specialists: None Patient coming from: Home  Chief Complaint: Chest pain  HPI: Marissa Lee is a 65 y.o. female with medical history significant of HLC, smoking.  Patient presents to the ED with c/o CP.  Pain is located in L chest, radiation to L arm.  Had 45 min episode a couple of days ago and then a 1.5 hr episode today.  Symptoms worse with exertion, better on rest.  No N/V, dizziness, palpitations, SOB.  Currently pain free.   Review of Systems: As per HPI otherwise 10 point review of systems negative.    Past Medical History  Diagnosis Date  . Endometriosis   . Fibroid   . Hypercholesteremia 2007  . Fibrocystic breast     muliple drainage  . Urge incontinence 06/05/2013    Past Surgical History  Procedure Laterality Date  . Foot surgery  1992  . Wisdom tooth extraction  1978  . Combined hysterectomy vaginal / oophorectomy / a&p repair  2004  . Bladder suspension  2004    done with TVH  . Breast biopsy  1980    done 3x     reports that she has quit smoking. Her smoking use included Cigarettes. She has a 20 pack-year smoking history. She quit smokeless tobacco use about 15 years ago. She reports that she drinks about 0.6 - 1.2 oz of alcohol per week. She reports that she does not use illicit drugs.  Allergies  Allergen Reactions  . Cephalosporins Swelling    Throat swells  . Codeine Itching    Family History  Problem Relation Age of Onset  . Thyroid disease Maternal Grandmother     Goiter  . Cancer Paternal Grandmother     Colon   . Cancer Paternal Grandfather     Lung  . Thyroid disease Daughter     Hypothyroid  . Heart disease Mother     valvular disease, valve replacement  . Atrial fibrillation Mother   . Stroke Mother   . Heart disease Brother     valve disease, valve surgery     Prior to  Admission medications   Medication Sig Start Date End Date Taking? Authorizing Provider  calcium carbonate (OSCAL) 1500 (600 Ca) MG TABS tablet Take 600 mg of elemental calcium by mouth daily with breakfast.   Yes Historical Provider, MD  doxycycline (ORACEA) 40 MG capsule Take 40 mg by mouth every morning.   Yes Historical Provider, MD  estradiol (VIVELLE-DOT) 0.05 MG/24HR patch Place 1 patch (0.05 mg total) onto the skin 2 (two) times a week. 06/25/15  Yes Brook Oletta Lamas, MD  FLUoxetine (PROZAC) 40 MG capsule TAKE ONE CAPSULE BY MOUTH EVERY DAY 06/25/15  Yes Nunzio Cobbs, MD  Multiple Vitamin (MULTIVITAMIN WITH MINERALS) TABS tablet Take 1 tablet by mouth daily.   Yes Historical Provider, MD    Physical Exam: Filed Vitals:   01/28/16 2315 01/28/16 2345 01/29/16 0015 01/29/16 0030  BP: 143/68 133/62 138/73 135/74  Pulse: 55 58 58 60  Temp:      TempSrc:      Resp: 14 10 19 15   SpO2: 99% 100% 100% 100%      Constitutional: NAD, calm, comfortable Filed Vitals:   01/28/16 2315 01/28/16 2345 01/29/16 0015 01/29/16 0030  BP: 143/68 133/62 138/73 135/74  Pulse: 55 58 58  60  Temp:      TempSrc:      Resp: 14 10 19 15   SpO2: 99% 100% 100% 100%   Eyes: PERRL, lids and conjunctivae normal ENMT: Mucous membranes are moist. Posterior pharynx clear of any exudate or lesions.Normal dentition.  Neck: normal, supple, no masses, no thyromegaly Respiratory: clear to auscultation bilaterally, no wheezing, no crackles. Normal respiratory effort. No accessory muscle use.  Cardiovascular: Regular rate and rhythm, no murmurs / rubs / gallops. No extremity edema. 2+ pedal pulses. No carotid bruits.  Abdomen: no tenderness, no masses palpated. No hepatosplenomegaly. Bowel sounds positive.  Musculoskeletal: no clubbing / cyanosis. No joint deformity upper and lower extremities. Good ROM, no contractures. Normal muscle tone.  Skin: no rashes, lesions, ulcers. No  induration Neurologic: CN 2-12 grossly intact. Sensation intact, DTR normal. Strength 5/5 in all 4.  Psychiatric: Normal judgment and insight. Alert and oriented x 3. Normal mood.    Labs on Admission: I have personally reviewed following labs and imaging studies  CBC:  Recent Labs Lab 01/28/16 1842  WBC 6.3  HGB 13.5  HCT 41.8  MCV 96.3  PLT [id number]   Basic Metabolic Panel:  Recent Labs Lab 01/28/16 1842  NA 139  K 3.9  CL 103  CO2 24  GLUCOSE 89  BUN 10  CREATININE 0.79  CALCIUM 9.2   GFR: CrCl cannot be calculated (Unknown ideal weight.). Liver Function Tests: No results for input(s): AST, ALT, ALKPHOS, BILITOT, PROT, ALBUMIN in the last 168 hours. No results for input(s): LIPASE, AMYLASE in the last 168 hours. No results for input(s): AMMONIA in the last 168 hours. Coagulation Profile: No results for input(s): INR, PROTIME in the last 168 hours. Cardiac Enzymes: No results for input(s): CKTOTAL, CKMB, CKMBINDEX, TROPONINI in the last 168 hours. BNP (last 3 results) No results for input(s): PROBNP in the last 8760 hours. HbA1C: No results for input(s): HGBA1C in the last 72 hours. CBG: No results for input(s): GLUCAP in the last 168 hours. Lipid Profile: No results for input(s): CHOL, HDL, LDLCALC, TRIG, CHOLHDL, LDLDIRECT in the last 72 hours. Thyroid Function Tests: No results for input(s): TSH, T4TOTAL, FREET4, T3FREE, THYROIDAB in the last 72 hours. Anemia Panel: No results for input(s): VITAMINB12, FOLATE, FERRITIN, TIBC, IRON, RETICCTPCT in the last 72 hours. Urine analysis:    Component Value Date/Time   BILIRUBINUR n 06/25/2015 0831   PROTEINUR n 06/25/2015 0831   UROBILINOGEN negative 06/25/2015 0831   NITRITE n 06/25/2015 0831   LEUKOCYTESUR Negative 06/25/2015 0831   Sepsis Labs: @LABRCNTIP (procalcitonin:4,lacticidven:4) )No results found for this or any previous visit (from the past 240 hour(s)).   Radiological Exams on Admission: Dg  Chest 2 View  01/28/2016  CLINICAL DATA:  Chest pain starting 2 weeks ago EXAM: CHEST  2 VIEW COMPARISON:  08/30/2012 FINDINGS: Cardiomediastinal silhouette is stable. No acute infiltrate or pleural effusion. No pulmonary edema. Bony thorax is unremarkable. IMPRESSION: No active cardiopulmonary disease. Electronically Signed   By: Lahoma Crocker M.D.   On: 01/28/2016 19:26    EKG: Independently reviewed.  Assessment/Plan Active Problems:   Chest pain    Chest pain - HEART score 3  Chest pain obs pathway  Serial trops  Stress test ordered for tomorrow AM.   DVT prophylaxis: lovenox Code Status: Full Family Communication: Family at bedside Consults called: None Admission status: Admit to obs   [facility], [facility] Pager [phone] from 7PM-7AM  If 7AM-7PM, please contact the day  physician for the patient [email or web address] Password [email or web address]  01/29/2016, 12:34 AM

## 2016-01-29 NOTE — Care Management Note (Signed)
Case Management Note Marvetta Gibbons RN, BSN Unit 2W-Case Manager [phone]  Patient Details  Name: Mackenize Delbuono MRN: [id number] Date of Birth: 1950-10-26  Subjective/Objective:     Pt admitted with chest pain               Action/Plan:  PTA pt lived at home- plan to return home - referral for PCP needs received- spoke with at bedside- confirmed pt has [place] # provided for provider list assistance with instructions for use. Pt to f/u with calling for PCP. No further CM needs noted  Expected Discharge Date:  01/30/16               Expected Discharge Plan:  Home/Self Care  In-House Referral:     Discharge planning Services  CM Consult  Post Acute Care Choice:    Choice offered to:     DME Arranged:    DME Agency:     HH Arranged:    HH Agency:     Status of Service:  Completed, signed off  Medicare Important Message Given:    Date Medicare IM Given:    Medicare IM give by:    Date Additional Medicare IM Given:    Additional Medicare Important Message give by:     If discussed at [place] of Stay Meetings, dates discussed:    Additional Comments:  Dawayne Patricia, RN 01/29/2016, 4:22 PM

## 2016-05-04 ENCOUNTER — Ambulatory Visit (HOSPITAL_COMMUNITY)
Admission: EM | Admit: 2016-05-04 | Discharge: 2016-05-04 | Disposition: A | Discharge status: Home / Self Care | Payer: BLUE CROSS/BLUE SHIELD | Attending: Family Medicine | Admitting: Family Medicine

## 2016-05-04 ENCOUNTER — Encounter (HOSPITAL_COMMUNITY): Payer: Self-pay | Admitting: Emergency Medicine

## 2016-05-04 DIAGNOSIS — S39012A Strain of muscle, fascia and tendon of lower back, initial encounter: Secondary | ICD-10-CM

## 2016-05-04 LAB — POCT URINALYSIS DIP (DEVICE)
GLUCOSE, UA: NEGATIVE mg/dL
HGB URINE DIPSTICK: NEGATIVE
Ketones, ur: NEGATIVE mg/dL
LEUKOCYTES UA: NEGATIVE
NITRITE: NEGATIVE
Protein, ur: NEGATIVE mg/dL
Specific Gravity, Urine: >=1.03 (ref 1.005–1.030)
UROBILINOGEN UA: 1 mg/dL (ref 0.0–1.0)
pH: 5.5 (ref 5.0–8.0)

## 2016-05-04 MED ORDER — DICLOFENAC POTASSIUM 50 MG PO TABS: 50.0000 mg | ORAL_TABLET | Freq: Three times a day (TID) | ORAL | 0 refills | Status: DC

## 2016-05-04 MED ORDER — CYCLOBENZAPRINE HCL 5 MG PO TABS: 5.0000 mg | ORAL_TABLET | Freq: Three times a day (TID) | ORAL | 0 refills | Status: DC

## 2016-05-04 NOTE — ED Triage Notes (Signed)
Noticed a different feeling in back after picking up a grand child more than a week ago.  Patient reports the next day (wednesday) pain in left lower back.  Pain with getting up from sitting, moving, bending.  Denies abdominal pain, no nausea, no vomiting, no diarrhea.

## 2016-05-04 NOTE — Discharge Instructions (Signed)
Use heat and medicine and avoid prolonged sitting or bending and lifting.

## 2016-05-04 NOTE — ED Provider Notes (Signed)
[place]    CSN: [id number] Arrival date & time: 05/04/16  1831  First Provider Contact:  First MD Initiated Contact with Patient 05/04/16 1939        History   Chief Complaint Chief Complaint  Patient presents with  . Back Pain    HPI Marissa Lee is a 65 y.o. female.   The history is provided by the patient.  Back Pain  Location:  Sacro-iliac joint Quality:  Shooting Radiates to:  L thigh Pain severity:  Moderate Onset quality:  Gradual Duration:  1 week Timing:  Constant Progression:  Unchanged Chronicity:  New Context: lifting heavy objects   Context comment:  Lifting grandchildren Relieved by:  Nothing Worsened by:  Bending Ineffective treatments:  None tried Associated symptoms: no abdominal pain, no abdominal swelling, no dysuria, no fever, no leg pain, no numbness, no paresthesias, no tingling and no weakness   Risk factors: lack of exercise     Past Medical History:  Diagnosis Date  . Endometriosis   . Fibrocystic breast    muliple drainage  . Fibroid   . Hypercholesteremia 2007  . Urge incontinence 06/05/2013    Patient Active Problem List   Diagnosis Date Noted  . Chest pain 01/29/2016  . Pain in the chest   . 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 Vivid dream 07/20/2015  . Unspecified constipation 09/06/2013  . Post-menopause on HRT (hormone replacement therapy) 06/05/2013    Past Surgical History:  Procedure Laterality Date  . BLADDER SUSPENSION  2004   done with TVH  . BREAST BIOPSY  1980   done 3x  . COMBINED HYSTERECTOMY VAGINAL / OOPHORECTOMY / A&P REPAIR  2004  . FOOT SURGERY  1992  . WISDOM TOOTH EXTRACTION  1978    OB History    Gravida Para Term Preterm AB Living   5 2 2   3 2    SAB TAB Ectopic Multiple Live Births   3       2       Home Medications    Prior to Admission medications   Medication Sig Start Date End Date Taking? Authorizing Provider    calcium carbonate (OSCAL) 1500 (600 Ca) MG TABS tablet Take 600 mg of elemental calcium by mouth daily with breakfast.    Historical Provider, MD  estradiol (VIVELLE-DOT) 0.05 MG/24HR patch Place 1 patch (0.05 mg total) onto the skin 2 (two) times a week. 06/25/15   Brook Oletta Lamas, MD  FLUoxetine (PROZAC) 40 MG capsule TAKE ONE CAPSULE BY MOUTH EVERY DAY 06/25/15   Nunzio Cobbs, MD  Multiple Vitamin (MULTIVITAMIN WITH MINERALS) TABS tablet Take 1 tablet by mouth daily.    Historical Provider, MD    Family History Family History  Problem Relation Age of Onset  . Thyroid disease Maternal Grandmother     Goiter  . Cancer Paternal Grandmother     Colon   . Cancer Paternal Grandfather     Lung  . Thyroid disease Daughter     Hypothyroid  . Heart disease Mother     valvular disease, valve replacement  . Atrial fibrillation Mother   . Stroke Mother   . Heart disease Brother     valve disease, valve surgery    Social History Social History  Substance Use Topics  . Smoking status: Former Smoker    Packs/day: 1.00    Years: 20.00    Types:  Cigarettes  . Smokeless tobacco: Former Systems developer    Quit date: 09/26/2000  . Alcohol use 0.6 - 1.2 oz/week    1 - 2 Standard drinks or equivalent per week     Comment: occasinal     Allergies   Cephalosporins and Codeine   Review of Systems Review of Systems  Constitutional: Negative.  Negative for fever.  Gastrointestinal: Negative.  Negative for abdominal pain.  Genitourinary: Negative for dysuria.  Musculoskeletal: Positive for back pain, gait problem and myalgias.  Skin: Negative.   Neurological: Negative for tingling, weakness, numbness and paresthesias.  All other systems reviewed and are negative.    Physical Exam Triage Vital Signs ED Triage Vitals [05/04/16 1917]  Enc Vitals Group     BP 120/69     Pulse Rate 67     Resp 16     Temp 97.4 F (36.3 C)     Temp Source Oral     SpO2 98 %     Weight       Height      Head Circumference      Peak Flow      Pain Score      Pain Loc      Pain Edu?      Excl. in [place]?    No data found.   Updated Vital Signs BP 120/69 (BP Location: Right Arm)   Pulse 67   Temp 97.4 F (36.3 C) (Oral)   Resp 16   LMP 09/27/2003   SpO2 98%   Visual Acuity Right Eye Distance:   Left Eye Distance:   Bilateral Distance:    Right Eye Near:   Left Eye Near:    Bilateral Near:     Physical Exam  Constitutional: She is oriented to person, place, and time. She appears well-developed and well-nourished.  Abdominal: Soft. Bowel sounds are normal. There is no tenderness.  Musculoskeletal: She exhibits tenderness.       Lumbar back: She exhibits decreased range of motion, tenderness, bony tenderness, pain and spasm. She exhibits normal pulse.  Neurological: She is alert and oriented to person, place, and time. She has normal reflexes.  Skin: Skin is warm and dry.  Nursing note and vitals reviewed.    UC Treatments / Results  Labs (all labs ordered are listed, but only abnormal results are displayed) Labs Reviewed - No data to display  EKG  EKG Interpretation None       Radiology No results found.  Procedures Procedures (including critical care time)  Medications Ordered in UC Medications - No data to display   Initial Impression / Assessment and Plan / UC Course  I have reviewed the triage vital signs and the nursing notes.  Pertinent labs & imaging results that were available during my care of the patient were reviewed by me and considered in my medical decision making (see chart for details).  Clinical Course      Final Clinical Impressions(s) / UC Diagnoses   Final diagnoses:  None    New Prescriptions New Prescriptions   No medications on file     Billy Fischer, MD 05/04/16 [phone]

## 2016-06-29 NOTE — Progress Notes (Signed)
65 y.o. [id number] [name] here for annual exam.    Had chest pain in May 2017.  Had a cardiac ECHO and a Myoview stress test which was a low risk study.  D Dimer was negative.  No further chest pain.   Still having night sweats at night.  Noticed this when she decreased from her transdermal estrogen going from 0.1 mg to 0.05 mg.   PCP: None  Patient's last menstrual period was 09/27/2003.           Sexually active: No. female The current method of family planning is status post hysterectomy.    Exercising: No.  Takes care of grandchildren Smoker:  former  Health Maintenance: Pap:  Years ago History of abnormal Pap:  no MMG:  08-17-15 3D/Density C/Neg/BiRads1:[facility] Colonoscopy: 10-06-14 polyp with Dr. Collene Mares. Next due 09/2019.  BMD:  ?2011--unsure  Result:Pt.thinks normal--in [place] TDaP:  UNSURE--greater than 10 years maybe.. May have had Pertussis vaccine in 2013.  Gardasil:   N/A HIV:  Today.  Hep C:  Today.  Screening Labs:    Urine today: neg.   reports that she has quit smoking. Her smoking use included Cigarettes. She has a 20.00 pack-year smoking history. She quit smokeless tobacco use about 15 years ago. She reports that she drinks about 1.8 - 2.4 oz of alcohol per week . She reports that she does not use drugs.  Past Medical History:  Diagnosis Date  . Endometriosis   . Fibrocystic breast    muliple drainage  . Fibroid   . Hypercholesteremia 2007  . Urge incontinence 06/05/2013    Past Surgical History:  Procedure Laterality Date  . BLADDER SUSPENSION  2004   done with TVH  . BREAST BIOPSY  1980   done 3x  . COMBINED HYSTERECTOMY VAGINAL / OOPHORECTOMY / A&P REPAIR  2004  . FOOT SURGERY  1992  . WISDOM TOOTH EXTRACTION  1978    Current Outpatient Prescriptions  Medication Sig Dispense Refill  . estradiol (VIVELLE-DOT) 0.05 MG/24HR patch Place 1 patch (0.05 mg total) onto the skin 2 (two) times a week. 8 patch 11  . FLUoxetine  (PROZAC) 40 MG capsule TAKE ONE CAPSULE BY MOUTH EVERY DAY 30 capsule 11  . Multiple Vitamin (MULTIVITAMIN WITH MINERALS) TABS tablet Take 1 tablet by mouth daily.    . ORACEA 40 MG capsule Take 1 tablet by mouth daily.     No current facility-administered medications for this visit.     Family History  Problem Relation Age of Onset  . Cancer Paternal Grandmother     Colon   . Cancer Paternal Grandfather     Lung  . Heart disease Mother     valvular disease, valve replacement  . Atrial fibrillation Mother   . Stroke Mother   . Heart disease Brother     valve disease, valve surgery  . Thyroid disease Maternal Grandmother     Goiter  . Thyroid disease Daughter     Hypothyroid    ROS:  Pertinent items are noted in HPI.  Otherwise, a comprehensive ROS was negative.  Exam:   BP 110/74 (BP Location: Right Arm, Patient Position: Sitting, Cuff Size: Normal)   Pulse 66   Resp 16   Ht 5' 3.25" (1.607 m)   Wt 140 lb 6.4 oz (63.7 kg)   LMP 09/27/2003   BMI 24.67 kg/m     General appearance: alert, cooperative and appears stated age Head: Normocephalic, without obvious abnormality,  atraumatic Neck: no adenopathy, supple, symmetrical, trachea midline and thyroid normal to inspection and palpation Lungs: clear to auscultation bilaterally Breasts: normal appearance, no masses or tenderness, No nipple retraction or dimpling, No nipple discharge or bleeding, No axillary or supraclavicular adenopathy Heart: regular rate and rhythm Abdomen: soft, non-tender; no masses, no organomegaly Extremities: extremities normal, atraumatic, no cyanosis or edema Skin: Skin color, texture, turgor normal. No rashes or lesions Lymph nodes: Cervical, supraclavicular, and axillary nodes normal. No abnormal inguinal nodes palpated Neurologic: Grossly normal  Pelvic: External genitalia:  no lesions              Urethra:  normal appearing urethra with no masses, tenderness or lesions               Bartholins and Skenes: normal                 Vagina: normal appearing vagina with normal color and discharge, no lesions              Cervix:  absent              Pap taken:  No. Bimanual Exam:  Uterus:  absent              Adnexa: no mass, fullness, tenderness              Rectal exam: Yes.  .  Confirms.              Anus:  normal sphincter tone, no lesions  Chaperone was present for exam.  Assessment:   Well woman visit with normal exam. Status post TVH. ERT patient.  Episode of CP.    Plan: Yearly mammogram recommended after age 68.  Recommended self breast exam.  Pap and HR HPV as above. Discussed Calcium, Vitamin D, regular exercise program including cardiovascular and weight bearing exercise. Tdap now.  Routine labs, Hep C aby, HIV. Refill of Vivelle Dot 0.05 mg twice weekly for 6 months.  Discussed risks of DVT, PE, stroke, possible MI and breast CA. We discussed potential weaning further and discontinuation of ERT. Will have patient establish care with internist with [facility].  I think she can benefit from further counseling regarding her cardiovascular fitness.  Prozac refill for one year.  Follow up annually and prn.        After visit summary provided.

## 2016-06-30 ENCOUNTER — Encounter: Payer: Self-pay | Admitting: Obstetrics and Gynecology

## 2016-06-30 ENCOUNTER — Ambulatory Visit (INDEPENDENT_AMBULATORY_CARE_PROVIDER_SITE_OTHER): Payer: BLUE CROSS/BLUE SHIELD | Admitting: Obstetrics and Gynecology

## 2016-06-30 VITALS — BP 110/74 | HR 66 | Resp 16 | Ht 63.25 in | Wt 140.4 lb

## 2016-06-30 DIAGNOSIS — Z5181 Encounter for therapeutic drug level monitoring: Secondary | ICD-10-CM

## 2016-06-30 DIAGNOSIS — Z01419 Encounter for gynecological examination (general) (routine) without abnormal findings: Secondary | ICD-10-CM | POA: Diagnosis not present

## 2016-06-30 DIAGNOSIS — Z87898 Personal history of other specified conditions: Secondary | ICD-10-CM | POA: Diagnosis not present

## 2016-06-30 DIAGNOSIS — Z79818 Long term (current) use of other agents affecting estrogen receptors and estrogen levels: Secondary | ICD-10-CM | POA: Diagnosis not present

## 2016-06-30 DIAGNOSIS — Z0000 Encounter for general adult medical examination without abnormal findings: Secondary | ICD-10-CM

## 2016-06-30 DIAGNOSIS — Z119 Encounter for screening for infectious and parasitic diseases, unspecified: Secondary | ICD-10-CM | POA: Diagnosis not present

## 2016-06-30 DIAGNOSIS — Z23 Encounter for immunization: Secondary | ICD-10-CM

## 2016-06-30 LAB — LIPID PANEL
Cholesterol: 263 mg/dL — ABNORMAL HIGH (ref 125–200)
HDL: 60 mg/dL (ref >=46)
LDL CALC: 187 mg/dL — AB (ref <130)
Total CHOL/HDL Ratio: 4.4 Ratio (ref <=5.0)
Triglycerides: 82 mg/dL (ref <150)
VLDL: 16 mg/dL (ref <30)

## 2016-06-30 LAB — COMPREHENSIVE METABOLIC PANEL
ALK PHOS: 44 U/L (ref 33–130)
ALT: 11 U/L (ref 6–29)
AST: 17 U/L (ref 10–35)
Albumin: 4.1 g/dL (ref 3.6–5.1)
BILIRUBIN TOTAL: 0.6 mg/dL (ref 0.2–1.2)
BUN: 12 mg/dL (ref 7–25)
CO2: 26 mmol/L (ref 20–31)
CREATININE: 0.82 mg/dL (ref 0.50–0.99)
Calcium: 9.1 mg/dL (ref 8.6–10.4)
Chloride: 103 mmol/L (ref 98–110)
GLUCOSE: 83 mg/dL (ref 65–99)
POTASSIUM: 4.3 mmol/L (ref 3.5–5.3)
SODIUM: 139 mmol/L (ref 135–146)
TOTAL PROTEIN: 6.4 g/dL (ref 6.1–8.1)

## 2016-06-30 LAB — POCT URINALYSIS DIPSTICK
BILIRUBIN UA: NEGATIVE
Glucose, UA: NEGATIVE
KETONES UA: NEGATIVE
LEUKOCYTES UA: NEGATIVE
Nitrite, UA: NEGATIVE
PH UA: 5
PROTEIN UA: NEGATIVE
RBC UA: NEGATIVE
Urobilinogen, UA: NEGATIVE

## 2016-06-30 LAB — CBC
HCT: 41.7 % (ref 35.0–45.0)
Hemoglobin: 13.8 g/dL (ref 11.7–15.5)
MCH: 31.6 pg (ref 27.0–33.0)
MCHC: 33.1 g/dL (ref 32.0–36.0)
MCV: 95.4 fL (ref 80.0–100.0)
MPV: 9.3 fL (ref 7.5–12.5)
PLATELETS: 321 10*3/uL (ref 140–400)
RBC: 4.37 MIL/uL (ref 3.80–5.10)
RDW: 13 % (ref 11.0–15.0)
WBC: 5 10*3/uL (ref 3.8–10.8)

## 2016-06-30 LAB — TSH: TSH: 2.13 m[IU]/L

## 2016-06-30 MED ORDER — FLUOXETINE HCL 40 MG PO CAPS: ORAL_CAPSULE | ORAL | 11 refills | Status: DC

## 2016-06-30 MED ORDER — ESTRADIOL 0.05 MG/24HR TD PTTW: 1.0000 | MEDICATED_PATCH | TRANSDERMAL | 5 refills | Status: DC

## 2016-06-30 NOTE — Patient Instructions (Signed)

## 2016-06-30 NOTE — Progress Notes (Signed)
Patient scheduled at [facility] with Dr. Quay Burow 09/28/16 at 9:00am. Earlier appointment offered, but patient declined; prefers to see physician vs. NP. Patient provided with contact information and address. Patient verbalizes understanding.

## 2016-07-01 LAB — HEPATITIS C ANTIBODY: HCV AB: NEGATIVE

## 2016-07-01 LAB — HIV ANTIBODY (ROUTINE TESTING W REFLEX): HIV 1&2 Ab, 4th Generation: NONREACTIVE

## 2016-07-01 LAB — VITAMIN D 25 HYDROXY (VIT D DEFICIENCY, FRACTURES): VIT D 25 HYDROXY: 35 ng/mL (ref 30–100)

## 2016-07-04 ENCOUNTER — Telehealth: Payer: Self-pay

## 2016-07-04 NOTE — Telephone Encounter (Signed)
Left message to call Annamaria Salah at [phone]. 

## 2016-07-04 NOTE — Telephone Encounter (Signed)
Patient returned call

## 2016-07-04 NOTE — Telephone Encounter (Signed)
-----   Message from Nunzio Cobbs, MD sent at 07/03/2016  7:27 PM EDT ----- Please report lab results to patient.   Her cholesterol is elevated.  The total is 263 and the LDL cholesterol, the bad one, is 187.   This can be lowered through a low cholesterol diet and vigorous exercise.  This will need follow up through her new PCP.   Her thyroid, blood counts, blood chemistries, and vitamin D are all normal.   The testing for hepatitis C and HIV are negative.

## 2016-07-05 NOTE — Telephone Encounter (Signed)
Patient has been notified of results. Patient is agreeable and verbalizes understanding.  Routing to provider for final review. Patient agreeable to disposition. Will close encounter.

## 2016-08-05 ENCOUNTER — Other Ambulatory Visit: Payer: Self-pay | Admitting: Obstetrics and Gynecology

## 2016-08-05 DIAGNOSIS — Z1231 Encounter for screening mammogram for malignant neoplasm of breast: Secondary | ICD-10-CM

## 2016-08-15 ENCOUNTER — Encounter (HOSPITAL_COMMUNITY): Payer: Self-pay | Admitting: Emergency Medicine

## 2016-08-15 ENCOUNTER — Emergency Department (HOSPITAL_COMMUNITY)
Admission: EM | Admit: 2016-08-15 | Discharge: 2016-08-15 | Disposition: A | Discharge status: Home / Self Care | Payer: BLUE CROSS/BLUE SHIELD | Attending: Emergency Medicine | Admitting: Emergency Medicine

## 2016-08-15 ENCOUNTER — Emergency Department (HOSPITAL_COMMUNITY): Payer: BLUE CROSS/BLUE SHIELD

## 2016-08-15 DIAGNOSIS — S62609A Fracture of unspecified phalanx of unspecified finger, initial encounter for closed fracture: Secondary | ICD-10-CM

## 2016-08-15 DIAGNOSIS — Y929 Unspecified place or not applicable: Secondary | ICD-10-CM | POA: Diagnosis not present

## 2016-08-15 DIAGNOSIS — S62612A Displaced fracture of proximal phalanx of right middle finger, initial encounter for closed fracture: Secondary | ICD-10-CM | POA: Insufficient documentation

## 2016-08-15 DIAGNOSIS — S62616A Displaced fracture of proximal phalanx of right little finger, initial encounter for closed fracture: Secondary | ICD-10-CM | POA: Diagnosis not present

## 2016-08-15 DIAGNOSIS — Y9301 Activity, walking, marching and hiking: Secondary | ICD-10-CM | POA: Insufficient documentation

## 2016-08-15 DIAGNOSIS — W010XXA Fall on same level from slipping, tripping and stumbling without subsequent striking against object, initial encounter: Secondary | ICD-10-CM | POA: Diagnosis not present

## 2016-08-15 DIAGNOSIS — Z87891 Personal history of nicotine dependence: Secondary | ICD-10-CM | POA: Insufficient documentation

## 2016-08-15 DIAGNOSIS — Y999 Unspecified external cause status: Secondary | ICD-10-CM | POA: Diagnosis not present

## 2016-08-15 DIAGNOSIS — S6991XA Unspecified injury of right wrist, hand and finger(s), initial encounter: Secondary | ICD-10-CM | POA: Diagnosis present

## 2016-08-15 MED ORDER — LIDOCAINE HCL (PF) 1 % IJ SOLN
30.0000 mL | Freq: Once | INTRAMUSCULAR | Status: AC
  Administered 2016-08-15: 30 mL
  Filled 2016-08-15: qty 30

## 2016-08-15 MED ORDER — NAPROXEN 375 MG PO TABS: 375.0000 mg | ORAL_TABLET | Freq: Two times a day (BID) | ORAL | 0 refills | Status: DC

## 2016-08-15 MED ORDER — MORPHINE SULFATE (PF) 4 MG/ML IV SOLN
4.0000 mg | Freq: Once | INTRAVENOUS | Status: AC
  Administered 2016-08-15: 4 mg via INTRAVENOUS
  Filled 2016-08-15: qty 1

## 2016-08-15 NOTE — ED Triage Notes (Addendum)
Pt arrives via [facility], ems reports pt was stepping up onto a curb when she missed her step and fell, pt caught herself on her right hand, pt has obvious deformities to 3rd and 4th fingers, pt has c collar in place but denies LOC with her fall. Pt received 100 mcg of fentanyl pta. Pt a/ox4, nad. resp e/u, vss.

## 2016-08-15 NOTE — ED Notes (Signed)
ED Provider at bedside. 

## 2016-08-15 NOTE — Discharge Instructions (Signed)
Wear the splint at all times, try to keep your  hand elevated when resting to help reduce the swelling,

## 2016-08-15 NOTE — ED Notes (Signed)
Patient transported to X-ray 

## 2016-08-15 NOTE — ED Notes (Signed)
Family at bedside. 

## 2016-08-15 NOTE — ED Provider Notes (Signed)
[place] DEPT Provider Note   CSN: [id number] Arrival date & time: 08/15/16  1536   History   Chief Complaint Chief Complaint  Patient presents with  . Fall  . Hand Injury    HPI Marissa Lee is a 65 y.o. female.   HPI Pt was walking when she tripped over a curb.  She landed on her outstretched right hand.  She injured her fingers and her elbow.  She did not hit her head.  She did not lose consciousness.  Her knee hurts a little but as well but otherwise no other injuries.  No dyspena.  No chest pain.   No numbness or weakness.    Past Medical History:  Diagnosis Date  . Endometriosis   . Fibrocystic breast    muliple drainage  . Fibroid   . Hypercholesteremia 2007  . Urge incontinence 06/05/2013    Patient Active Problem List   Diagnosis Date Noted  . Chest pain 01/29/2016  . Pain in the chest   . 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 Vivid dream 07/20/2015  . Unspecified constipation 09/06/2013  . Post-menopause on HRT (hormone replacement therapy) 06/05/2013    Past Surgical History:  Procedure Laterality Date  . BLADDER SUSPENSION  2004   done with TVH  . BREAST BIOPSY  1980   done 3x  . COMBINED HYSTERECTOMY VAGINAL / OOPHORECTOMY / A&P REPAIR  2004  . FOOT SURGERY  1992  . WISDOM TOOTH EXTRACTION  1978    OB History    Gravida Para Term Preterm AB Living   5 2 2   3 2    SAB TAB Ectopic Multiple Live Births   3       2       Home Medications    Prior to Admission medications   Medication Sig Start Date End Date Taking? Authorizing Provider  estradiol (VIVELLE-DOT) 0.05 MG/24HR patch Place 1 patch (0.05 mg total) onto the skin 2 (two) times a week. 06/30/16   Brook Oletta Lamas, MD  FLUoxetine (PROZAC) 40 MG capsule TAKE ONE CAPSULE BY MOUTH EVERY DAY 06/30/16   Nunzio Cobbs, MD  Multiple Vitamin (MULTIVITAMIN WITH MINERALS) TABS tablet Take 1 tablet by mouth daily.     Historical Provider, MD  naproxen (NAPROSYN) 375 MG tablet Take 1 tablet (375 mg total) by mouth 2 (two) times daily. 08/15/16   Dorie Rank, MD  ORACEA 40 MG capsule Take 1 tablet by mouth daily. 03/30/16   Historical Provider, MD    Family History Family History  Problem Relation Age of Onset  . Cancer Paternal Grandmother     Colon   . Cancer Paternal Grandfather     Lung  . Heart disease Mother     valvular disease, valve replacement  . Atrial fibrillation Mother   . Stroke Mother   . Heart disease Brother     valve disease, valve surgery  . Thyroid disease Maternal Grandmother     Goiter  . Thyroid disease Daughter     Hypothyroid    Social History Social History  Substance Use Topics  . Smoking status: Former Smoker    Packs/day: 1.00    Years: 20.00    Types: Cigarettes  . Smokeless tobacco: Former Systems developer    Quit date: 09/26/2000  . Alcohol use 1.8 - 2.4 oz/week    1 - 2 Standard drinks or equivalent, 2 Cans of beer per  week     Comment: occasinal     Allergies   Cephalosporins and Codeine   Review of Systems Review of Systems  All other systems reviewed and are negative.    Physical Exam Updated Vital Signs BP 112/62   Pulse 61   Temp 98.1 F (36.7 C) (Oral)   Resp 16   LMP 09/27/2003   SpO2 98%   Physical Exam  Constitutional: She appears well-developed and well-nourished. No distress.  HENT:  Head: Normocephalic and atraumatic.  Right Ear: External ear normal.  Left Ear: External ear normal.  Eyes: Conjunctivae are normal. Right eye exhibits no discharge. Left eye exhibits no discharge. No scleral icterus.  Neck: Neck supple. No tracheal deviation present.  Cardiovascular: Normal rate, regular rhythm and intact distal pulses.   Pulmonary/Chest: Effort normal and breath sounds normal. No stridor. No respiratory distress. She has no wheezes. She has no rales.  Abdominal: Soft. Bowel sounds are normal. She exhibits no distension. There is no  tenderness. There is no rebound and no guarding.  Musculoskeletal: She exhibits deformity. She exhibits no edema.       Right shoulder: She exhibits no tenderness, no bony tenderness and no swelling.       Left shoulder: She exhibits no tenderness, no bony tenderness and no swelling.       Right wrist: She exhibits decreased range of motion, tenderness, bony tenderness, swelling and deformity.       Left wrist: She exhibits no tenderness, no bony tenderness and no swelling.       Right hip: She exhibits normal range of motion, no tenderness, no bony tenderness and no swelling.       Left hip: She exhibits normal range of motion, no tenderness and no bony tenderness.       Right knee: Normal.       Left knee: Normal.       Right ankle: She exhibits no swelling. No tenderness.       Left ankle: She exhibits no swelling. No tenderness.       Cervical back: She exhibits no tenderness, no bony tenderness and no swelling.       Thoracic back: She exhibits no tenderness, no bony tenderness and no swelling.       Lumbar back: She exhibits no tenderness, no bony tenderness and no swelling.  Deformity of the middle ring and small finger of the right hand, distal sensation intact, cap refill less than 2 seconds, strong radial pulses  Neurological: She is alert. She has normal strength. No cranial nerve deficit (no facial droop, extraocular movements intact, no slurred speech) or sensory deficit. She exhibits normal muscle tone. She displays no seizure activity. Coordination normal.  Skin: Skin is warm and dry. No rash noted.  Psychiatric: She has a normal mood and affect.  Nursing note and vitals reviewed.    ED Treatments / Results  Labs (all labs ordered are listed, but only abnormal results are displayed) Labs Reviewed - No data to display  EKG  EKG Interpretation None       Radiology Dg Elbow Complete Right  Result Date: 08/15/2016 CLINICAL DATA:  Fall with elbow pain EXAM: RIGHT  ELBOW - COMPLETE 3+ VIEW COMPARISON:  None. FINDINGS: No fracture or dislocation.  No significant elbow effusion. IMPRESSION: No acute osseous abnormality Electronically Signed   By: Donavan Foil M.D.   On: 08/15/2016 17:02   Dg Hand Complete Right  Result Date: 08/15/2016 CLINICAL DATA:  Fall with deformity EXAM: RIGHT HAND - COMPLETE 3+ VIEW COMPARISON:  None. FINDINGS: Slightly impacted fracture involving the mid and proximal shaft of the third proximal phalanx with 1/4 shaft diameter of ulnar displacement of distal fracture fragment. Additional slightly comminuted appearing impacted and mildly displaced fracture involving the proximal metaphysis of the fourth proximal phalanx. Mild angulation of distal fracture fragment in a ulnar and dorsal direction. Slightly impacted fracture involving proximal metaphysis of the proximal phalanx with moderate ulnar and dorsal angulation of distal fracture fragments. No subluxation. No radiopaque foreign body. IMPRESSION: Displaced and angulated, slightly comminuted appearing fractures involving the third through fifth proximal phalanges. Electronically Signed   By: Donavan Foil M.D.   On: 08/15/2016 17:01    Procedures Reduction of fracture Date/Time: 08/15/2016 6:52 PM Performed by: Dorie Rank Authorized by: Dorie Rank  Consent: Verbal consent obtained. Risks and benefits: risks, benefits and alternatives were discussed Local anesthesia used: yes Anesthesia: digital block  Anesthesia: Local anesthesia used: yes Local Anesthetic: lidocaine 1% without epinephrine  Sedation: Patient sedated: no Patient tolerance: Patient tolerated the procedure well with no immediate complications Comments: Traction to the fingers to help reduce fracture, splinted in mild flexion    (including critical care time)  Medications Ordered in ED Medications  morphine 4 MG/ML injection 4 mg (4 mg Intravenous Given 08/15/16 1627)  lidocaine (PF) (XYLOCAINE) 1 %  injection 30 mL (30 mLs Other Given 08/15/16 1810)  morphine 4 MG/ML injection 4 mg (4 mg Intravenous Given 08/15/16 1817)     Initial Impression / Assessment and Plan / ED Course  I have reviewed the triage vital signs and the nursing notes.  Pertinent labs & imaging results that were available during my care of the patient were reviewed by me and considered in my medical decision making (see chart for details).  Clinical Course   isolated hand fx.  No other injuries noted.  Fx reduced.  Follow up with ortho hand  Final Clinical Impressions(s) / ED Diagnoses   Final diagnoses:  Closed fractures of multiple sites of phalanx of finger of right hand, initial encounter    New Prescriptions New Prescriptions   NAPROXEN (NAPROSYN) 375 MG TABLET    Take 1 tablet (375 mg total) by mouth 2 (two) times daily.     Dorie Rank, MD 08/15/16 [phone]

## 2016-08-15 NOTE — Progress Notes (Signed)
Orthopedic Tech Progress Note Patient Details:  Marissa Lee January 21, 1951 [id number]  Ortho Devices Type of Ortho Device: Volar splint Ortho Device/Splint Location: RUE Ortho Device/Splint Interventions: Ordered, Application   Braulio Bosch 08/15/2016, 6:36 PM

## 2016-08-15 NOTE — ED Notes (Signed)
Pt verbalized discharge and follow up instructions, pt driven home by daughter at bedside upon discharge

## 2016-08-19 NOTE — H&P (Signed)
Marissa Lee is an 65 y.o. female.   Chief Complaint: CLOSED DISPLACED FRACTURE OF PROXIMAL PHALANX OF RIGHT LONG, RING, AND SMALL FINGERS HPI: PATIENT FELL ONTO ARM WHILE GETTING OUT OF CAR ON 08/15/16 SEEN AT [facility] ON 11/20 AND PUT IN A SPLINT. SEEN IN THE OFFICE ON 11/21.  PATIENT IS HERE TODAY FOR SURGERY.  Past Medical History:  Diagnosis Date  . Endometriosis   . Fibrocystic breast    muliple drainage  . Fibroid   . Hypercholesteremia 2007  . Urge incontinence 06/05/2013    Past Surgical History:  Procedure Laterality Date  . BLADDER SUSPENSION  2004   done with [facility]  . BREAST BIOPSY  1980   done 3x  . COMBINED HYSTERECTOMY VAGINAL / OOPHORECTOMY / A&P REPAIR  2004  . FOOT SURGERY  1992  . WISDOM TOOTH EXTRACTION  1978    Family History  Problem Relation Age of Onset  . Cancer Paternal Grandmother     Colon   . Cancer Paternal Grandfather     Lung  . Heart disease Mother     valvular disease, valve replacement  . Atrial fibrillation Mother   . Stroke Mother   . Heart disease Brother     valve disease, valve surgery  . Thyroid disease Maternal Grandmother     Goiter  . Thyroid disease Daughter     Hypothyroid   Social History:  reports that she has quit smoking. Her smoking use included Cigarettes. She has a 20.00 pack-year smoking history. She quit smokeless tobacco use about 15 years ago. She reports that she drinks about 1.8 - 2.4 oz of alcohol per week . She reports that she does not use drugs.  Allergies:  Allergies  Allergen Reactions  . Cephalosporins Anaphylaxis and Swelling    Throat swells  . Codeine Itching    No prescriptions prior to admission.    No results found for this or any previous visit (from the past 48 hour(s)). No results found.  ROS NO RECENT ILLNESSES OR HOSPITALIZATIONS  Last menstrual period 09/27/2003. Physical Exam  General Appearance:  Alert, cooperative, no distress, appears stated age  Head:  Normocephalic,  without obvious abnormality, atraumatic  Eyes:  Pupils equal, conjunctiva/corneas clear,         Throat: Lips, mucosa, and tongue normal; teeth and gums normal  Neck: No visible masses     Lungs:   respirations unlabored  Chest Wall:  No tenderness or deformity  Heart:  Regular rate and rhythm,  Abdomen:   Soft, non-tender,         Extremities: RUE: SPLINT INTACT, FINGERS WARM WELL PERFUSED ABLE TO WIGGLE FINGERS GOOD THUMB MOBILITY  Pulses: 2+ and symmetric  Skin: Skin color, texture, turgor normal, no rashes or lesions     Neurologic: Normal    Assessment CLOSED DISPLACED FRACTURE OF PROXIMAL PHALANX OF RIGHT LONG, RING, AND SMALL FINGERS  Plan RIGHT HAND LONG, RING, AND SMALL FINGER CLOSED REDUCTION AND PERCUTANEOUS PINNING WITH POSSIBLE OPEN REDUCTION AND INTERNAL FIXATION  R/B/A DISCUSSED WITH PT IN OFFICE.  PT VOICED UNDERSTANDING OF PLAN CONSENT SIGNED DAY OF SURGERY PT SEEN AND EXAMINED PRIOR TO OPERATIVE PROCEDURE/DAY OF SURGERY SITE MARKED. QUESTIONS ANSWERED WILL GO HOME FOLLOWING SURGERY  WE ARE PLANNING SURGERY FOR YOUR UPPER EXTREMITY. THE RISKS AND BENEFITS OF SURGERY INCLUDE BUT NOT LIMITED TO BLEEDING INFECTION, DAMAGE TO NEARBY NERVES ARTERIES TENDONS, FAILURE OF SURGERY TO ACCOMPLISH ITS INTENDED GOALS, PERSISTENT SYMPTOMS AND NEED FOR  FURTHER SURGICAL INTERVENTION. WITH THIS IN MIND WE WILL PROCEED. I HAVE DISCUSSED WITH THE PATIENT THE PRE AND POSTOPERATIVE REGIMEN AND THE DOS AND DON'TS. PT VOICED UNDERSTANDING AND INFORMED CONSENT SIGNED.  Brynda Peon 08/19/2016, 8:56 AM

## 2016-08-23 ENCOUNTER — Encounter (HOSPITAL_COMMUNITY)
Admission: RE | Admit: 2016-08-23 | Discharge: 2016-08-23 | Disposition: A | Discharge status: Home / Self Care | Payer: BLUE CROSS/BLUE SHIELD | Source: Ambulatory Visit | Attending: Orthopedic Surgery | Admitting: Orthopedic Surgery

## 2016-08-23 ENCOUNTER — Encounter (HOSPITAL_COMMUNITY): Payer: Self-pay

## 2016-08-23 DIAGNOSIS — S62614A Displaced fracture of proximal phalanx of right ring finger, initial encounter for closed fracture: Secondary | ICD-10-CM | POA: Diagnosis not present

## 2016-08-23 DIAGNOSIS — Z87891 Personal history of nicotine dependence: Secondary | ICD-10-CM | POA: Diagnosis not present

## 2016-08-23 DIAGNOSIS — F419 Anxiety disorder, unspecified: Secondary | ICD-10-CM | POA: Diagnosis not present

## 2016-08-23 DIAGNOSIS — W19XXXA Unspecified fall, initial encounter: Secondary | ICD-10-CM | POA: Diagnosis not present

## 2016-08-23 DIAGNOSIS — Z79890 Hormone replacement therapy (postmenopausal): Secondary | ICD-10-CM | POA: Diagnosis not present

## 2016-08-23 DIAGNOSIS — S62612A Displaced fracture of proximal phalanx of right middle finger, initial encounter for closed fracture: Secondary | ICD-10-CM | POA: Diagnosis present

## 2016-08-23 DIAGNOSIS — S62616A Displaced fracture of proximal phalanx of right little finger, initial encounter for closed fracture: Secondary | ICD-10-CM | POA: Diagnosis not present

## 2016-08-23 HISTORY — DX: Personal history of other diseases of the respiratory system: Z87.09

## 2016-08-23 HISTORY — DX: Anxiety disorder, unspecified: F41.9

## 2016-08-23 LAB — CBC
HEMATOCRIT: 43.4 % (ref 36.0–46.0)
HEMOGLOBIN: 14.2 g/dL (ref 12.0–15.0)
MCH: 31.5 pg (ref 26.0–34.0)
MCHC: 32.7 g/dL (ref 30.0–36.0)
MCV: 96.2 fL (ref 78.0–100.0)
Platelets: 346 10*3/uL (ref 150–400)
RBC: 4.51 MIL/uL (ref 3.87–5.11)
RDW: 13.1 % (ref 11.5–15.5)
WBC: 5 10*3/uL (ref 4.0–10.5)

## 2016-08-23 NOTE — Pre-Procedure Instructions (Signed)
Marissa Lee  08/23/2016      Marissa Lee [facility] [address], [place] [address] Phone: [phone] Fax: [phone]  [facility] - [place], [place] - [address] [place] [address] AT [address] [place] [address] Phone: [phone] Fax: [phone]  [facility] - [place], [place] [address] Phone: [phone] Fax: [phone]    Your procedure is scheduled on Wednesday, November 29th, 2017.  Report to [facility] Admitting at 3:00 P.M.   Call this number if you have problems the morning of surgery:  [phone]   Remember:  Do not eat food or drink liquids after midnight.   Take these medicines the morning of surgery with A SIP OF WATER: Fluoxetine (Prozac).  Stop taking: Naproxen (Naprosyn), Aspirin, NSAIDS, Aleve, Naproxen, Ibuprofen, Advil, Motrin, BC's, Goody's, Fish oil, all herbal medications, and all vitamins.    Do not wear jewelry, make-up or nail polish.  Do not wear lotions, powders, or perfumes, or deoderant.  Do not shave 48 hours prior to surgery.    Do not bring valuables to the hospital.  [facility] is not responsible for any belongings or valuables.  Contacts, dentures or bridgework may not be worn into surgery.  Leave your suitcase in the car.  After surgery it may be brought to your room.  For patients admitted to the hospital, discharge time will be determined by your treatment team.  Patients discharged the day of surgery will not be allowed to drive home.   Special instructions:  Preparing for Surgery.   [facility]- Preparing For Surgery  Before surgery, you can play an important role. Because skin is not sterile, your skin needs to be as free of germs as possible. You can reduce the number of germs on your skin by washing  with CHG (chlorahexidine gluconate) Soap before surgery.  CHG is an antiseptic cleaner which kills germs and bonds with the skin to continue killing germs even after washing.  Please do not use if you have an allergy to CHG or antibacterial soaps. If your skin becomes reddened/irritated stop using the CHG.  Do not shave (including legs and underarms) for at least 48 hours prior to first CHG shower. It is OK to shave your face.  Please follow these instructions carefully.   1. Shower the NIGHT BEFORE SURGERY and the MORNING OF SURGERY with CHG.   2. If you chose to wash your hair, wash your hair first as usual with your normal shampoo.  3. After you shampoo, rinse your hair and body thoroughly to remove the shampoo.  4. Use CHG as you would any other liquid soap. You can apply CHG directly to the skin and wash gently with a scrungie or a clean washcloth.   5. Apply the CHG Soap to your body ONLY FROM THE NECK DOWN.  Do not use on open wounds or open sores. Avoid contact with your eyes, ears, mouth and genitals (private parts). Wash genitals (private parts) with your normal soap.  6. Wash thoroughly, paying special attention to the area where your surgery will be performed.  7. Thoroughly rinse your body with warm water from the neck down.  8. DO NOT shower/wash with your normal soap after using and rinsing off the CHG Soap.  9. Pat yourself  dry with a CLEAN TOWEL.   10. Wear CLEAN PAJAMAS   11. Place CLEAN SHEETS on your bed the night of your first shower and DO NOT SLEEP WITH PETS.   Day of Surgery: Do not apply any deodorants/lotions. Please wear clean clothes to the hospital/surgery center.     Please read over the following fact sheets that you were given.

## 2016-08-23 NOTE — Progress Notes (Signed)
PCP - no PCP per patient Cardiologist - denies  EKG - 01/30/16 CXR - 01/28/16  Echo - 01/2016 Stress test - 01/2016 Cardiac Cath - denies  Patient denies chest pain and shortness of breath at PAT appointment.    Patient lives alone and has concern about being discharged after surgery on 08/24/16.  Patient informed nurse that she doesn't really have anyone to stay with her after surgery.  Nurse left message with Margarita Grizzle at Dr. Angus Palms office expressing patient's concern.

## 2016-08-24 ENCOUNTER — Encounter (HOSPITAL_COMMUNITY)
Admission: RE | Disposition: A | Discharge status: Home / Self Care | Payer: Self-pay | Source: Ambulatory Visit | Attending: Orthopedic Surgery

## 2016-08-24 ENCOUNTER — Ambulatory Visit (HOSPITAL_COMMUNITY): Payer: BLUE CROSS/BLUE SHIELD | Admitting: Anesthesiology

## 2016-08-24 ENCOUNTER — Ambulatory Visit (HOSPITAL_COMMUNITY)
Admission: RE | Admit: 2016-08-24 | Discharge: 2016-08-24 | Disposition: A | Discharge status: Home / Self Care | Payer: BLUE CROSS/BLUE SHIELD | Source: Ambulatory Visit | Attending: Orthopedic Surgery | Admitting: Orthopedic Surgery

## 2016-08-24 ENCOUNTER — Encounter (HOSPITAL_COMMUNITY): Payer: Self-pay | Admitting: Surgery

## 2016-08-24 DIAGNOSIS — F419 Anxiety disorder, unspecified: Secondary | ICD-10-CM | POA: Insufficient documentation

## 2016-08-24 DIAGNOSIS — S62612A Displaced fracture of proximal phalanx of right middle finger, initial encounter for closed fracture: Secondary | ICD-10-CM | POA: Diagnosis not present

## 2016-08-24 DIAGNOSIS — W19XXXA Unspecified fall, initial encounter: Secondary | ICD-10-CM | POA: Insufficient documentation

## 2016-08-24 DIAGNOSIS — Z79890 Hormone replacement therapy (postmenopausal): Secondary | ICD-10-CM | POA: Insufficient documentation

## 2016-08-24 DIAGNOSIS — Z87891 Personal history of nicotine dependence: Secondary | ICD-10-CM | POA: Insufficient documentation

## 2016-08-24 DIAGNOSIS — S62614A Displaced fracture of proximal phalanx of right ring finger, initial encounter for closed fracture: Secondary | ICD-10-CM | POA: Insufficient documentation

## 2016-08-24 DIAGNOSIS — S62616A Displaced fracture of proximal phalanx of right little finger, initial encounter for closed fracture: Secondary | ICD-10-CM | POA: Insufficient documentation

## 2016-08-24 HISTORY — PX: CLOSED REDUCTION METACARPAL WITH PERCUTANEOUS PINNING: SHX5613

## 2016-08-24 HISTORY — PX: OPEN REDUCTION INTERNAL FIXATION (ORIF) HAND: SHX5991

## 2016-08-24 SURGERY — OPEN REDUCTION INTERNAL FIXATION (ORIF) HAND
Anesthesia: Monitor Anesthesia Care | Laterality: Right

## 2016-08-24 MED ORDER — BUPIVACAINE-EPINEPHRINE (PF) 0.5% -1:200000 IJ SOLN
INTRAMUSCULAR | Status: DC | PRN
  Administered 2016-08-24: 30 mL via PERINEURAL

## 2016-08-24 MED ORDER — PROPOFOL 500 MG/50ML IV EMUL
INTRAVENOUS | Status: DC | PRN
  Administered 2016-08-24: 75 ug/kg/min via INTRAVENOUS

## 2016-08-24 MED ORDER — FENTANYL CITRATE (PF) 100 MCG/2ML IJ SOLN
INTRAMUSCULAR | Status: AC
  Filled 2016-08-24: qty 2

## 2016-08-24 MED ORDER — MIDAZOLAM HCL 2 MG/2ML IJ SOLN
INTRAMUSCULAR | Status: AC
  Filled 2016-08-24: qty 2

## 2016-08-24 MED ORDER — LACTATED RINGERS IV SOLN: INTRAVENOUS | Status: DC

## 2016-08-24 MED ORDER — PROPOFOL 10 MG/ML IV BOLUS
INTRAVENOUS | Status: AC
  Filled 2016-08-24: qty 40

## 2016-08-24 MED ORDER — LACTATED RINGERS IV SOLN
INTRAVENOUS | Status: DC | PRN
  Administered 2016-08-24: 17:00:00 via INTRAVENOUS

## 2016-08-24 MED ORDER — LIDOCAINE-EPINEPHRINE (PF) 1.5 %-1:200000 IJ SOLN
INTRAMUSCULAR | Status: DC | PRN
  Administered 2016-08-24: 25 mL via PERINEURAL

## 2016-08-24 MED ORDER — CLINDAMYCIN PHOSPHATE 900 MG/50ML IV SOLN: 900.0000 mg | INTRAVENOUS | Status: DC

## 2016-08-24 MED ORDER — MIDAZOLAM HCL 2 MG/2ML IJ SOLN
INTRAMUSCULAR | Status: DC | PRN
  Administered 2016-08-24: 1 mg via INTRAVENOUS

## 2016-08-24 MED ORDER — 0.9 % SODIUM CHLORIDE (POUR BTL) OPTIME
TOPICAL | Status: DC | PRN
  Administered 2016-08-24: 1000 mL

## 2016-08-24 MED ORDER — CHLORHEXIDINE GLUCONATE 4 % EX LIQD: 60.0000 mL | Freq: Once | CUTANEOUS | Status: DC

## 2016-08-24 MED ORDER — CLINDAMYCIN PHOSPHATE 900 MG/50ML IV SOLN
900.0000 mg | INTRAVENOUS | Status: AC
  Administered 2016-08-24: 900 mg via INTRAVENOUS
  Filled 2016-08-24: qty 50

## 2016-08-24 MED ORDER — BUPIVACAINE HCL (PF) 0.25 % IJ SOLN
INTRAMUSCULAR | Status: AC
  Filled 2016-08-24: qty 30

## 2016-08-24 MED ORDER — FENTANYL CITRATE (PF) 100 MCG/2ML IJ SOLN
INTRAMUSCULAR | Status: DC | PRN
  Administered 2016-08-24: 50 ug via INTRAVENOUS

## 2016-08-24 SURGICAL SUPPLY — 57 items
BANDAGE ACE 3X5.8 VEL STRL LF (GAUZE/BANDAGES/DRESSINGS) ×3 IMPLANT
BANDAGE ACE 4X5 VEL STRL LF (GAUZE/BANDAGES/DRESSINGS) ×3 IMPLANT
BANDAGE ELASTIC 3 VELCRO ST LF (GAUZE/BANDAGES/DRESSINGS) IMPLANT
BENZOIN TINCTURE PRP APPL 2/3 (GAUZE/BANDAGES/DRESSINGS) IMPLANT
BLADE SURG ROTATE 9660 (MISCELLANEOUS) IMPLANT
BNDG ESMARK 4X9 LF (GAUZE/BANDAGES/DRESSINGS) IMPLANT
BNDG GAUZE ELAST 4 BULKY (GAUZE/BANDAGES/DRESSINGS) ×3 IMPLANT
CLOSURE WOUND 1/2 X4 (GAUZE/BANDAGES/DRESSINGS)
CORDS BIPOLAR (ELECTRODE) ×3 IMPLANT
COVER SURGICAL LIGHT HANDLE (MISCELLANEOUS) ×3 IMPLANT
CUFF TOURNIQUET SINGLE 18IN (TOURNIQUET CUFF) ×3 IMPLANT
CUFF TOURNIQUET SINGLE 24IN (TOURNIQUET CUFF) IMPLANT
DRAIN TLS ROUND 10FR (DRAIN) IMPLANT
DRAPE OEC MINIVIEW 54X84 (DRAPES) ×3 IMPLANT
DRAPE SURG 17X11 SM STRL (DRAPES) ×3 IMPLANT
DRSG ADAPTIC 3X8 NADH LF (GAUZE/BANDAGES/DRESSINGS) ×3 IMPLANT
DRSG EMULSION OIL 3X3 NADH (GAUZE/BANDAGES/DRESSINGS) IMPLANT
GAUZE SPONGE 4X4 12PLY STRL (GAUZE/BANDAGES/DRESSINGS) ×3 IMPLANT
GAUZE SPONGE 4X4 16PLY XRAY LF (GAUZE/BANDAGES/DRESSINGS) ×3 IMPLANT
GAUZE XEROFORM 1X8 LF (GAUZE/BANDAGES/DRESSINGS) ×3 IMPLANT
GLOVE BIOGEL PI IND STRL 8.5 (GLOVE) ×1 IMPLANT
GLOVE BIOGEL PI INDICATOR 8.5 (GLOVE) ×2
GLOVE SURG ORTHO 8.0 STRL STRW (GLOVE) ×3 IMPLANT
GOWN STRL REUS W/ TWL LRG LVL3 (GOWN DISPOSABLE) ×3 IMPLANT
GOWN STRL REUS W/ TWL XL LVL3 (GOWN DISPOSABLE) ×1 IMPLANT
GOWN STRL REUS W/TWL LRG LVL3 (GOWN DISPOSABLE) ×6
GOWN STRL REUS W/TWL XL LVL3 (GOWN DISPOSABLE) ×2
K-WIRE .045 CH (WIRE) ×18
KIT BASIN OR (CUSTOM PROCEDURE TRAY) ×3 IMPLANT
KIT ROOM TURNOVER OR (KITS) ×3 IMPLANT
KWIRE .045 CH (WIRE) ×6 IMPLANT
MANIFOLD NEPTUNE II (INSTRUMENTS) ×3 IMPLANT
NEEDLE HYPO 25X1 1.5 SAFETY (NEEDLE) ×3 IMPLANT
NS IRRIG 1000ML POUR BTL (IV SOLUTION) ×3 IMPLANT
PACK ORTHO EXTREMITY (CUSTOM PROCEDURE TRAY) ×3 IMPLANT
PAD ARMBOARD 7.5X6 YLW CONV (MISCELLANEOUS) ×6 IMPLANT
PAD CAST 4YDX4 CTTN HI CHSV (CAST SUPPLIES) ×2 IMPLANT
PADDING CAST COTTON 4X4 STRL (CAST SUPPLIES) ×4
SOAP 2 % CHG 4 OZ (WOUND CARE) ×3 IMPLANT
SPLINT FIBERGLASS 3X35 (CAST SUPPLIES) ×3 IMPLANT
STRIP CLOSURE SKIN 1/2X4 (GAUZE/BANDAGES/DRESSINGS) IMPLANT
SUT ETHILON 4 0 P 3 18 (SUTURE) IMPLANT
SUT ETHILON 4 0 PS 2 18 (SUTURE) IMPLANT
SUT ETHILON 5 0 P 3 18 (SUTURE)
SUT MNCRL AB 4-0 PS2 18 (SUTURE) IMPLANT
SUT NYLON ETHILON 5-0 P-3 1X18 (SUTURE) IMPLANT
SUT PROLENE 4 0 P 3 18 (SUTURE) IMPLANT
SUT VIC AB 2-0 FS1 27 (SUTURE) IMPLANT
SUT VICRYL 4-0 PS2 18IN ABS (SUTURE) IMPLANT
SYR CONTROL 10ML LL (SYRINGE) IMPLANT
SYSTEM CHEST DRAIN TLS 7FR (DRAIN) IMPLANT
TOWEL OR 17X24 6PK STRL BLUE (TOWEL DISPOSABLE) ×3 IMPLANT
TOWEL OR 17X26 10 PK STRL BLUE (TOWEL DISPOSABLE) ×3 IMPLANT
TUBE CONNECTING 12'X1/4 (SUCTIONS) ×1
TUBE CONNECTING 12X1/4 (SUCTIONS) ×2 IMPLANT
WATER STERILE IRR 1000ML POUR (IV SOLUTION) IMPLANT
YANKAUER SUCT BULB TIP NO VENT (SUCTIONS) IMPLANT

## 2016-08-24 NOTE — Anesthesia Preprocedure Evaluation (Signed)
Anesthesia Evaluation  Patient identified by MRN, date of birth, ID band Patient awake    Reviewed: Allergy & Precautions, NPO status , Patient's Chart, lab work & pertinent test results  History of Anesthesia Complications Negative for: history of anesthetic complications  Airway Mallampati: II  TM Distance: >3 FB Neck ROM: Full    Dental  (+) Dental Advisory Given   Pulmonary former smoker,    breath sounds clear to auscultation       Cardiovascular negative cardio ROS   Rhythm:Regular Rate:Normal     Neuro/Psych Anxiety negative neurological ROS     GI/Hepatic Neg liver ROS,   Endo/Other  negative endocrine ROS  Renal/GU negative Renal ROS     Musculoskeletal   Abdominal   Peds  Hematology negative hematology ROS (+)   Anesthesia Other Findings   Reproductive/Obstetrics                             Anesthesia Physical Anesthesia Plan  ASA: II  Anesthesia Plan: Regional and MAC   Post-op Pain Management:    Induction:   Airway Management Planned: Natural Airway and Nasal Cannula  Additional Equipment:   Intra-op Plan:   Post-operative Plan:   Informed Consent: I have reviewed the patients History and Physical, chart, labs and discussed the procedure including the risks, benefits and alternatives for the proposed anesthesia with the patient or authorized representative who has indicated his/her understanding and acceptance.   Dental advisory given  Plan Discussed with: CRNA and Surgeon  Anesthesia Plan Comments: (Plan routine monitors, axillary block with sedation)        Anesthesia Quick Evaluation

## 2016-08-24 NOTE — Progress Notes (Signed)
Orthopedic Tech Progress Note Patient Details:  Marissa Lee 04/21/1951 [id number]  Ortho Devices Type of Ortho Device: Arm sling Ortho Device/Splint Interventions: Ordered, Application   Braulio Bosch 08/24/2016, 8:11 PM

## 2016-08-24 NOTE — Transfer of Care (Signed)
Immediate Anesthesia Transfer of Care Note  Patient: Marissa Lee  Procedure(s) Performed: Procedure(s): RIGHT HAND LONG RING AND SMALL FINGER  POSSIBLE ORIF (Right) CLOSED REDUCTION  WITH PERCUTANEOUS PINNING (Right)  Patient Location: PACU  Anesthesia Type:MAC  Level of Consciousness: awake, alert  and oriented  Airway & Oxygen Therapy: Patient Spontanous Breathing  Post-op Assessment: Report given to RN and Post -op Vital signs reviewed and stable  Post vital signs: Reviewed and stable  Last Vitals:  Vitals:   08/24/16 1847 08/24/16 1900  BP: (!) 122/57 122/62  Pulse: 77 71  Resp: 10 12  Temp: 36.5 C     Last Pain:  Vitals:   08/24/16 1847  TempSrc:   PainSc: 0-No pain      Patients Stated Pain Goal: 4 ([id number])  Complications: No apparent anesthesia complications

## 2016-08-24 NOTE — Anesthesia Procedure Notes (Signed)
Procedure Name: MAC Date/Time: 08/24/2016 6:05 PM Performed by: Eligha Bridegroom Pre-anesthesia Checklist: Patient identified, Emergency Drugs available, Suction available, Timeout performed and Patient being monitored Patient Re-evaluated:Patient Re-evaluated prior to inductionOxygen Delivery Method: Nasal cannula Preoxygenation: Pre-oxygenation with 100% oxygen Intubation Type: IV induction

## 2016-08-24 NOTE — Anesthesia Postprocedure Evaluation (Signed)
Anesthesia Post Note  Patient: Marissa Lee  Procedure(s) Performed: Procedure(s) (LRB): RIGHT HAND LONG RING AND SMALL FINGER  POSSIBLE ORIF (Right) CLOSED REDUCTION  WITH PERCUTANEOUS PINNING (Right)  Patient location during evaluation: PACU Anesthesia Type: Regional Level of consciousness: awake and alert and patient cooperative Pain management: pain level controlled Vital Signs Assessment: post-procedure vital signs reviewed and stable Respiratory status: spontaneous breathing and respiratory function stable Cardiovascular status: stable Anesthetic complications: no    Last Vitals:  Vitals:   08/24/16 1900 08/24/16 1909  BP: 122/62 (!) 130/58  Pulse: 71 71  Resp: 12 12  Temp:  36.4 C    Last Pain:  Vitals:   08/24/16 1909  TempSrc:   PainSc: 0-No pain                 [place] DAVID

## 2016-08-24 NOTE — Anesthesia Procedure Notes (Signed)
Anesthesia Regional Block:  Axillary brachial plexus block  Pre-Anesthetic Checklist: ,, timeout performed, Correct Patient, Correct Site, Correct Laterality, Correct Procedure, Correct Position, site marked, Risks and benefits discussed,  Surgical consent,  Pre-op evaluation,  At surgeon's request and post-op pain management  Laterality: Right and Upper  Prep: chloraprep       Needles:  Injection technique: Single-shot  Needle Type: Other   (short "B" bevel)   Needle Length: 4cm 4 cm Needle Gauge: 22 and 22 G    Additional Needles:  Procedures: paresthesia technique Axillary brachial plexus block  Nerve Stimulator or Paresthesia:  Response: median nerve,  Response: radial nerve,   Additional Responses:   Narrative:  Start time: 08/24/2016 5:13 PM End time: 08/24/2016 5:22 PM Injection made incrementally with aspirations every 5 mL.  Performed by: Personally  Anesthesiologist: Glennon Mac, Torrance Stockley  Additional Notes: Pt identified in Holding room.  Monitors applied. Working IV access confirmed. Sterile prep R axilla.  #22ga short "B" bevel to transient median then ulnar paresthesiae.  Total 25cc 1.5% lidocaine with 1:200k epi and 30cc 0.5% Bupivacaine with 1:200k epi injected incrementally after negative test doses, around each paresthesia.  Additional 5cc 1.5% lidocaine with 1:200k epi intercostobrachial supplementation.  Patient asymptomatic, VSS, no heme aspirated, tolerated well.  Jenita Seashore, MD

## 2016-08-24 NOTE — Discharge Instructions (Signed)
KEEP BANDAGE CLEAN AND DRY °CALL OFFICE FOR F/U APPT [phone] IN 10 DAYS °KEEP HAND ELEVATED ABOVE HEART °OK TO APPLY ICE TO OPERATIVE AREA °CONTACT OFFICE IF ANY WORSENING PAIN OR CONCERNS. °

## 2016-08-25 ENCOUNTER — Encounter (HOSPITAL_COMMUNITY): Payer: Self-pay | Admitting: Orthopedic Surgery

## 2016-08-26 DIAGNOSIS — M8580 Other specified disorders of bone density and structure, unspecified site: Secondary | ICD-10-CM

## 2016-08-26 HISTORY — DX: Other specified disorders of bone density and structure, unspecified site: M85.80

## 2016-08-28 NOTE — Op Note (Signed)
NAME:  Marissa Lee, Marissa Lee                ACCOUNT NO.:  [id number]  MEDICAL RECORD NO.:  [id number]  LOCATION:                               FACILITY:  [facility]  PHYSICIAN:  Linna Hoff IV, M.D.DATE OF BIRTH:  [birth year]  DATE OF PROCEDURE: DATE OF DISCHARGE:                              OPERATIVE REPORT   PREOPERATIVE DIAGNOSES: 1. Right long finger proximal phalangeal shaft fracture. 2. Right ring finger proximal phalangeal shaft fracture. 3. Right small finger proximal phalangeal shaft fracture.  POSTOPERATIVE DIAGNOSES: 1. Right long finger proximal phalangeal shaft fracture. 2. Right ring finger proximal phalangeal shaft fracture. 3. Right small finger proximal phalangeal shaft fracture.  SURGICAL PROCEDURE: 1. Closed manipulation and percutaneous skeletal fixation of unstable     right long finger proximal phalanx fracture. 2. Closed reduction and percutaneous skeletal fixation of an unstable     right ring finger proximal phalanx fracture. 3. Closed reduction and percutaneous skeletal fixation of right small     finger proximal phalanx fracture. 4. Radiographs, 3 views, right hand.  ATTENDING PHYSICIAN:  Linna Hoff, M.D., was scrubbed and present for the entire procedure.  ASSISTANT SURGEON:  Gertie Fey, PA-C, who was scrubbed and necessary to help aid in reduction and placement of the K-wires and application of splint.  SURGICAL INDICATIONS:  Marissa Lee is a right-hand dominant female who sustained multiple phalangeal shaft fractures.  The patient was seen and evaluated in the office and recommended to undergo the above procedure. Risks, benefits, and alternatives were discussed in detail with the patient and signed informed consent was obtained.  Risks include, but not limited to, bleeding; infection; damage to nearby nerves, arteries, or tendons; loss of motion of wrist and digits; incomplete relief of symptoms; and need for further surgical  intervention.  DESCRIPTION OF PROCEDURE:  The patient was properly identified in the preoperative holding area, marked with a permanent marker on the right hand to indicate the correct operative site.  The patient was then brought back to the operating room and placed supine on the anesthesia table where general anesthesia was administered.  The patient tolerated this well.  A well-padded tourniquet was placed on the right brachium and sealed with 1000 drape.  The right upper extremity was then prepped and draped in normal sterile fashion.  Time-out was called and the correct side was identified and the procedure was then begun.  Attention was then turned to the right hand.  A closed manipulation was then performed.  The physician assistant was necessary for obtaining reduction and holding the reduction.  Once the reduction was held in place, two 0.045 K-wires were then placed antegrade from the proximal phalanx across the fracture site engaging the opposite cortices.  This was in a cross K-wire fashion.  This was done to the middle finger.  It was felt to be in good position.  Confirmed using mini C-arm.  After this was carried out, attention was then turned to the ring finger and to the small finger where this exact same technique was then used and a cross K-wire fixation was done.  The K-wires were then cut and then bent and left out of the  skin.  Xeroform was then applied around the pin sites.  Xeroform bolster dressing was then applied.  Final radiographs were then obtained.  The patient was then placed in a well-padded volar splint, extubated, and taken to recovery room in good condition.  INTRAOPERATIVE RADIOGRAPHS:  AP, lateral, and oblique views of the hand did show the cross K-wire fixation in place and in good position.  POSTPROCEDURAL PLAN:  The patient will be discharged to home, seen back in the office in approximately 10 days for pin check, application of a splint by the  therapist.  Pins in for a total of 4 weeks, and then begin an outpatient therapy regimen at the 4-week mark once the pins come out. All questions were answered.  The patient tolerated the procedure well.     [name], M.D.   ______________________________ [name], M.D.    FWO/MEDQ  D:  08/27/2016  T:  08/28/2016  Job:  [id number]

## 2016-08-28 NOTE — Op Note (Deleted)
  The note originally documented on this encounter has been moved the the encounter in which it belongs.  

## 2016-08-31 ENCOUNTER — Telehealth: Payer: Self-pay | Admitting: Obstetrics and Gynecology

## 2016-08-31 DIAGNOSIS — Z780 Asymptomatic menopausal state: Secondary | ICD-10-CM

## 2016-08-31 DIAGNOSIS — Z79890 Hormone replacement therapy (postmenopausal): Secondary | ICD-10-CM

## 2016-08-31 NOTE — Telephone Encounter (Signed)
Patient is calling to get an order sent for a bone density. She has an appointment scheduled for 09/07/16.

## 2016-08-31 NOTE — Telephone Encounter (Signed)
Spoke with patient. Advised order placed for BMD. Advised patient to call [facility] directly for scheduling BMD -provided patient with contact number [phone]. Patient verbalizes understanding and is agreeable.   Routing to provider for final review. Patient is agreeable to disposition. Will close encounter.   Last AEX  06/30/16   Health Maintenance: Pap:  Years ago History of abnormal Pap:  no MMG:  08-17-15 3D/Density C/Neg/BiRads1:[facility] Colonoscopy: 10-06-14 polyp with Dr. Collene Mares. Next due 09/2019.  BMD:  ?2011--unsure  Result:Pt.thinks normal--in [place] TDaP:  UNSURE--greater than 10 years maybe.. May have had Pertussis vaccine in 2013.  Gardasil:   N/A HIV:  Today.  Hep C:  Today.  Screening Labs:    Urine today: neg.

## 2016-09-07 ENCOUNTER — Ambulatory Visit
Admission: RE | Admit: 2016-09-07 | Discharge: 2016-09-07 | Disposition: A | Discharge status: Home / Self Care | Payer: BLUE CROSS/BLUE SHIELD | Source: Ambulatory Visit | Attending: Obstetrics and Gynecology | Admitting: Obstetrics and Gynecology

## 2016-09-07 DIAGNOSIS — Z1231 Encounter for screening mammogram for malignant neoplasm of breast: Secondary | ICD-10-CM

## 2016-09-09 ENCOUNTER — Encounter: Payer: Self-pay | Admitting: Obstetrics and Gynecology

## 2016-09-09 ENCOUNTER — Ambulatory Visit
Admission: RE | Admit: 2016-09-09 | Discharge: 2016-09-09 | Disposition: A | Discharge status: Home / Self Care | Payer: BLUE CROSS/BLUE SHIELD | Source: Ambulatory Visit | Attending: Obstetrics and Gynecology | Admitting: Obstetrics and Gynecology

## 2016-09-09 ENCOUNTER — Telehealth: Payer: Self-pay | Admitting: *Deleted

## 2016-09-09 DIAGNOSIS — Z780 Asymptomatic menopausal state: Secondary | ICD-10-CM

## 2016-09-09 DIAGNOSIS — Z79890 Hormone replacement therapy (postmenopausal): Secondary | ICD-10-CM

## 2016-09-09 NOTE — Telephone Encounter (Signed)
-----   Message from Nunzio Cobbs, MD sent at 09/09/2016  1:27 PM EST ----- Please report BMD results to patient which show mild osteopenia of the hip and spine.  No treatment is indicated at this time.  Continue weight bearing exercise, calcium 1200 mg daily and vit D 800 IU daily.  Recheck BMD in 2 years.   Cc- Marisa Sprinkles

## 2016-09-09 NOTE — Telephone Encounter (Signed)
Left message to call Yelena Metzer at [phone].  

## 2016-09-09 NOTE — Telephone Encounter (Signed)
Spoke with patient, advised of results as seen below per Dr. Quincy Simmonds. Patient verbalizes understanding and is agreeable. Patient read back Calcium 1200 mg daily and vit D 800 IU daily.  Routing to provider for final review. Patient is agreeable to disposition. Will close encounter.

## 2016-09-28 ENCOUNTER — Encounter: Payer: Self-pay | Admitting: Internal Medicine

## 2016-09-28 ENCOUNTER — Ambulatory Visit (INDEPENDENT_AMBULATORY_CARE_PROVIDER_SITE_OTHER): Payer: BLUE CROSS/BLUE SHIELD | Admitting: Internal Medicine

## 2016-09-28 DIAGNOSIS — E7800 Pure hypercholesterolemia, unspecified: Secondary | ICD-10-CM | POA: Diagnosis not present

## 2016-09-28 DIAGNOSIS — K5900 Constipation, unspecified: Secondary | ICD-10-CM

## 2016-09-28 DIAGNOSIS — M8580 Other specified disorders of bone density and structure, unspecified site: Secondary | ICD-10-CM | POA: Insufficient documentation

## 2016-09-28 DIAGNOSIS — E785 Hyperlipidemia, unspecified: Secondary | ICD-10-CM | POA: Insufficient documentation

## 2016-09-28 MED ORDER — EZETIMIBE 10 MG PO TABS: 10.0000 mg | ORAL_TABLET | Freq: Every day | ORAL | 3 refills | Status: DC

## 2016-09-28 NOTE — Patient Instructions (Addendum)
Have fasting blood work done in 8 weeks.  The lab opens 7:30 , M-F.  Test(s) ordered today. Your results will be released to [facility] (or called to you) after review, usually within 72hours after test completion. If any changes need to be made, you will be notified at that same time.  All other Health Maintenance issues reviewed.   All recommended immunizations and age-appropriate screenings are up-to-date or discussed.  No immunizations administered today.   Medications reviewed and updated.  Changes include starting zetia for your cholesterol.  Your prescription(s) have been submitted to your pharmacy. Please take as directed and contact our office if you believe you are having problem(s) with the medication(s).   Please followup in one year for a physical

## 2016-09-28 NOTE — Assessment & Plan Note (Signed)
Increase water intake and fiber Stool softener if needed

## 2016-09-28 NOTE — Progress Notes (Signed)
Pre visit review using our clinic review tool, if applicable. No additional management support is needed unless otherwise documented below in the visit note. 

## 2016-09-28 NOTE — Progress Notes (Signed)
Subjective:    Patient ID: Marissa Lee, female    DOB: 1950/10/18, 66 y.o.   MRN: [id number]  HPI She is here to establish with a new pcp.    She broke three fingers on her right hand after a fall.  She had pins placed in her fingers and just recently removed.  She is doing OT.    Osteopenia:  She had a recent dexa scan and was diagnosed with osteopenia.  She has started taking calcium and vitamin d daily.  She does not exercise regularly, but is active with grandchildren.  Subjective hypoglycemia:  She carries glucose tabs with her and they help.  It tends to occur in the afternoon.  She has never been diagnosed diabetes/prediabetes.   Medications and allergies reviewed with patient and updated if appropriate.  Patient Active Problem List   Diagnosis Date Noted  . Osteopenia 09/28/2016  . Hyperlipidemia 09/28/2016  . 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 Vivid dream 07/20/2015  . Constipation 09/06/2013  . Post-menopause on HRT (hormone replacement therapy) 06/05/2013    Current Outpatient Prescriptions on File Prior to Visit  Medication Sig Dispense Refill  . Dextrose, Diabetic Use, (GLUCOSE PO) Take 1 tablet by mouth daily as needed. When feels like sugar is low    . estradiol (VIVELLE-DOT) 0.05 MG/24HR patch Place 1 patch (0.05 mg total) onto the skin 2 (two) times a week. 8 patch 5  . FLUoxetine (PROZAC) 40 MG capsule TAKE ONE CAPSULE BY MOUTH EVERY DAY 30 capsule 11   No current facility-administered medications on file prior to visit.     Past Medical History:  Diagnosis Date  . Anxiety   . Endometriosis   . Fibrocystic breast    muliple drainage  . Fibroid   . History of bronchitis   . Hypercholesteremia 2007   currently not taking anything for it  . Osteopenia 08/2016   hip and spine  . Urge incontinence 06/05/2013    Past Surgical History:  Procedure Laterality Date  . BLADDER SUSPENSION  2004   done  with TVH  . BREAST BIOPSY  1980   done 3x  . CLOSED REDUCTION METACARPAL WITH PERCUTANEOUS PINNING Right 08/24/2016   Procedure: CLOSED REDUCTION  WITH PERCUTANEOUS PINNING;  Surgeon: Iran Planas, MD;  Location: [place];  Service: Orthopedics;  Laterality: Right;  . COLONOSCOPY    . COMBINED HYSTERECTOMY VAGINAL / OOPHORECTOMY / A&P REPAIR  2004  . FOOT SURGERY Right 1992  . OPEN REDUCTION INTERNAL FIXATION (ORIF) HAND Right 08/24/2016   Procedure: RIGHT HAND LONG RING AND SMALL FINGER  POSSIBLE ORIF;  Surgeon: Iran Planas, MD;  Location: [place];  Service: Orthopedics;  Laterality: Right;  . [place] EXTRACTION  1978    Social History   Social History  . Marital status: Single    Spouse name: N/A  . Number of children: N/A  . Years of education: N/A   Social History Main Topics  . Smoking status: Former Smoker    Packs/day: 1.00    Years: 20.00    Types: Cigarettes  . Smokeless tobacco: Never Used     Comment: quit in approximately 2000  . Alcohol use 1.8 - 2.4 oz/week    2 Cans of beer, 1 - 2 Standard drinks or equivalent per week     Comment: rarely  . Drug use: No  . Sexual activity: No     Comment: Hyst  Other Topics Concern  . None   Social History Narrative  . None    Family History  Problem Relation Age of Onset  . Cancer Paternal Grandmother     Colon   . Cancer Paternal Grandfather     Lung  . Heart disease Mother     valvular disease, valve replacement  . Atrial fibrillation Mother   . Stroke Mother   . Heart disease Brother     valve disease, valve surgery  . Thyroid disease Maternal Grandmother     Goiter  . Thyroid disease Daughter     Hypothyroid    Review of Systems  Constitutional: Negative for chills and fever.  HENT: Positive for sore throat. Negative for ear pain.   Respiratory: Positive for cough (URI). Negative for shortness of breath and wheezing.   Cardiovascular: Negative for chest pain, palpitations and leg swelling.    Gastrointestinal: Positive for constipation. Negative for abdominal pain, blood in stool and diarrhea.       No gerd  Genitourinary: Negative for dysuria and hematuria.  Musculoskeletal: Positive for back pain (intermittent).  Neurological: Negative for dizziness (has had vertigo in the past), light-headedness and headaches.  Psychiatric/Behavioral: Negative for dysphoric mood. The patient is not nervous/anxious.        Objective:   Vitals:   09/28/16 0910  BP: 134/82  Pulse: 86  Resp: 16  Temp: 98.2 F (36.8 C)   Filed Weights   09/28/16 0910  Weight: 145 lb (65.8 kg)   Body mass index is 24.13 kg/m.  Wt Readings from Last 3 Encounters:  09/28/16 145 lb (65.8 kg)  08/24/16 143 lb (64.9 kg)  08/23/16 143 lb (64.9 kg)     Physical Exam Constitutional: She appears well-developed and well-nourished. No distress.  HENT:  Head: Normocephalic and atraumatic.  Right Ear: External ear normal. Normal ear canal and TM Left Ear: External ear normal.  Normal ear canal and TM Mouth/Throat: Oropharynx is clear and moist.  Eyes: Conjunctivae and EOM are normal.  Neck: Neck supple. No tracheal deviation present. No thyromegaly present.  No carotid bruit  Cardiovascular: Normal rate, regular rhythm and normal heart sounds.   No murmur heard.  No edema. Pulmonary/Chest: Effort normal and breath sounds normal. No respiratory distress. She has no wheezes. She has no rales.  Breast: deferred to Gyn Abdominal: Soft. She exhibits no distension. There is no tenderness.  Lymphadenopathy: She has no cervical adenopathy.  Skin: Skin is warm and dry. She is not diaphoretic.  Psychiatric: She has a normal mood and affect. Her behavior is normal.         Assessment & Plan:    See Problem List for Assessment and Plan of chronic medical problems.  FU annually for CPE

## 2016-09-28 NOTE — Assessment & Plan Note (Addendum)
Has tried statins in the past - metallic taste, palpitations Willing to try zetia Recheck lipids in 8 weeks

## 2016-09-28 NOTE — Assessment & Plan Note (Addendum)
dexa up to date Taking calcium 1000mg  / day and vitamin d 2000 units a day encouraged regular exercise

## 2016-10-09 NOTE — Progress Notes (Signed)
Subjective:    Patient ID: Marissa Lee, female    DOB: 1950-12-31, 66 y.o.   MRN: [id number]  HPI She is here for an acute visit.   She started having cold symptoms 09/30/16.  She was having body aches, fever (temp 101), chills, nasal congestion, cough, fatigue, headaches and sore throat. She went to a clinic in [place].  She tested positive for the flu and was diagnosed with a possible lower respiratory infection.  She was prescribed doxycycline, tamiflu and feels better.  She currently has fatigue, decreased appetite and cough.  She is bringing up some phlegm with the cough.  She denies SOB or wheeze.    She is sleeping at night.  She has not been taking any over-the-counter cold medications.  Overall she feels better.      Medications and allergies reviewed with patient and updated if appropriate.  Patient Active Problem List   Diagnosis Date Noted  . Osteopenia 09/28/2016  . Hyperlipidemia 09/28/2016  . 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 Vivid dream 07/20/2015  . Constipation 09/06/2013  . Post-menopause on HRT (hormone replacement therapy) 06/05/2013    Current Outpatient Prescriptions on File Prior to Visit  Medication Sig Dispense Refill  . Calcium-Phosphorus-Vitamin D (CITRACAL +D3 PO) Take by mouth daily.    Marland Kitchen Dextrose, Diabetic Use, (GLUCOSE PO) Take 1 tablet by mouth daily as needed. When feels like sugar is low    . estradiol (VIVELLE-DOT) 0.05 MG/24HR patch Place 1 patch (0.05 mg total) onto the skin 2 (two) times a week. 8 patch 5  . ezetimibe (ZETIA) 10 MG tablet Take 1 tablet (10 mg total) by mouth daily. 90 tablet 3  . FLUoxetine (PROZAC) 40 MG capsule TAKE ONE CAPSULE BY MOUTH EVERY DAY 30 capsule 11   No current facility-administered medications on file prior to visit.     Past Medical History:  Diagnosis Date  . Anxiety   . Endometriosis   . Fibrocystic breast    muliple drainage  .  Fibroid   . History of bronchitis   . Hypercholesteremia 2007   currently not taking anything for it  . Osteopenia 08/2016   hip and spine  . Urge incontinence 06/05/2013    Past Surgical History:  Procedure Laterality Date  . ABDOMINAL HYSTERECTOMY    . BLADDER SUSPENSION  2004   done with TVH  . BREAST BIOPSY  1980   done 3x  . CLOSED REDUCTION METACARPAL WITH PERCUTANEOUS PINNING Right 08/24/2016   Procedure: CLOSED REDUCTION  WITH PERCUTANEOUS PINNING;  Surgeon: Iran Planas, MD;  Location: [place];  Service: Orthopedics;  Laterality: Right;  . COLONOSCOPY    . COMBINED HYSTERECTOMY VAGINAL / OOPHORECTOMY / A&P REPAIR  2004  . FOOT SURGERY Right 1992  . OPEN REDUCTION INTERNAL FIXATION (ORIF) HAND Right 08/24/2016   Procedure: RIGHT HAND LONG RING AND SMALL FINGER  POSSIBLE ORIF;  Surgeon: Iran Planas, MD;  Location: [place];  Service: Orthopedics;  Laterality: Right;  . [place] EXTRACTION  1978    Social History   Social History  . Marital status: Single    Spouse name: N/A  . Number of children: N/A  . Years of education: N/A   Social History Main Topics  . Smoking status: Former Smoker    Packs/day: 1.00    Years: 20.00    Types: Cigarettes  . Smokeless tobacco: Never Used     Comment: quit in approximately  2000  . Alcohol use 1.8 - 2.4 oz/week    2 Cans of beer, 1 - 2 Standard drinks or equivalent per week     Comment: rarely  . Drug use: No  . Sexual activity: No     Comment: Hyst   Other Topics Concern  . None   Social History Narrative  . None    Family History  Problem Relation Age of Onset  . Cancer Paternal Grandmother     Colon   . Cancer Paternal Grandfather     Lung  . Heart disease Mother     valvular disease, valve replacement  . Atrial fibrillation Mother   . Stroke Mother   . Heart disease Brother     valve disease, valve surgery  . Thyroid disease Maternal Grandmother     Goiter  . Thyroid disease Daughter     Hypothyroid     Review of Systems     Objective:   Vitals:   10/10/16 1119  BP: 122/80  Pulse: 98  Resp: 18  Temp: 98.3 F (36.8 C)   Filed Weights   10/10/16 1119  Weight: 141 lb (64 kg)   Body mass index is 23.46 kg/m.  Wt Readings from Last 3 Encounters:  10/10/16 141 lb (64 kg)  09/28/16 145 lb (65.8 kg)  08/24/16 143 lb (64.9 kg)     Physical Exam GENERAL APPEARANCE: Appears stated age, well appearing, NAD EYES: conjunctiva clear, no icterus HEENT: bilateral tympanic membranes and ear canals normal, oropharynx with no erythema, no thyromegaly, trachea midline, no cervical or supraclavicular lymphadenopathy LUNGS: Wet sounding cough.  Clear to auscultation without wheeze or crackles, unlabored breathing, good air entry bilaterally HEART: Normal S1,S2 without murmurs EXTREMITIES: Without clubbing, cyanosis, or edema        Assessment & Plan:   See Problem List for Assessment and Plan of chronic medical problems.

## 2016-10-10 ENCOUNTER — Ambulatory Visit (INDEPENDENT_AMBULATORY_CARE_PROVIDER_SITE_OTHER): Payer: BLUE CROSS/BLUE SHIELD | Admitting: Internal Medicine

## 2016-10-10 ENCOUNTER — Encounter: Payer: Self-pay | Admitting: Internal Medicine

## 2016-10-10 ENCOUNTER — Ambulatory Visit (INDEPENDENT_AMBULATORY_CARE_PROVIDER_SITE_OTHER)
Admission: RE | Admit: 2016-10-10 | Discharge: 2016-10-10 | Disposition: A | Discharge status: Home / Self Care | Payer: BLUE CROSS/BLUE SHIELD | Source: Ambulatory Visit | Attending: Internal Medicine | Admitting: Internal Medicine

## 2016-10-10 VITALS — BP 122/80 | HR 98 | Temp 98.3°F | Resp 18 | Wt 141.0 lb

## 2016-10-10 DIAGNOSIS — R05 Cough: Secondary | ICD-10-CM

## 2016-10-10 DIAGNOSIS — R059 Cough, unspecified: Secondary | ICD-10-CM | POA: Insufficient documentation

## 2016-10-10 DIAGNOSIS — J22 Unspecified acute lower respiratory infection: Secondary | ICD-10-CM

## 2016-10-10 DIAGNOSIS — J111 Influenza due to unidentified influenza virus with other respiratory manifestations: Secondary | ICD-10-CM

## 2016-10-10 NOTE — Progress Notes (Signed)
Pre visit review using our clinic review tool, if applicable. No additional management support is needed unless otherwise documented below in the visit note. 

## 2016-10-10 NOTE — Patient Instructions (Addendum)
A chest xray was ordered.  We will call you with the results.     Medications reviewed and updated.  No changes recommended at this time.   Treat your cold symptoms with over the counter cold medications at needed.

## 2016-10-10 NOTE — Assessment & Plan Note (Signed)
Test positive in urgent care in [place] Completed tamiflu Advised symptoms will improve slowly Symptomatic treatment

## 2016-10-10 NOTE — Assessment & Plan Note (Signed)
?   Bronchitis vs PNA vs just viral in nature Will complete doxycycline - has two pills left will check CXR today to confirm she did not / does not have PNA - if so may need to follow up CXR Clinically improving Symptomatic treatment

## 2016-10-10 NOTE — Assessment & Plan Note (Signed)
Related to flu, +/- bacterial lower resp infection Will check cxr Complete doxycycline Symptomatic treatment for cough

## 2017-01-25 ENCOUNTER — Other Ambulatory Visit: Payer: Self-pay | Admitting: Obstetrics and Gynecology

## 2017-01-26 NOTE — Telephone Encounter (Signed)
Please contact patient to see if she is weaning off her hormones.  We talked about this at her last visit.

## 2017-01-26 NOTE — Telephone Encounter (Signed)
Tried calling patient, no answer, left message for patient to return call.

## 2017-01-26 NOTE — Telephone Encounter (Signed)
Medication refill request: Vivelle-Dot Last AEX:  06/30/16 BS Next AEX: 07/10/17  Last MMG (if hormonal medication request): 09/07/16 BIRADS 1 negative/density d  Refill authorized: 06/30/16 #8 patches w/5 refills; today please advise

## 2017-01-27 ENCOUNTER — Other Ambulatory Visit: Payer: Self-pay | Admitting: Obstetrics and Gynecology

## 2017-01-27 NOTE — Telephone Encounter (Signed)
Medication refill request: Estradiol patch Last AEX:  06/30/16 BS Next AEX: 07/10/17 BS Last MMG (if hormonal medication request): 09/07/16 [facility] Refill authorized: 06/30/16 #8 Patch 5R. Please advise. Thank you.

## 2017-01-30 NOTE — Telephone Encounter (Signed)
Patient returned my call. Per patient states that she has weaned down to the 0.05mg  and needs a refill on this. Please advise.

## 2017-07-10 ENCOUNTER — Encounter: Payer: Self-pay | Admitting: Obstetrics and Gynecology

## 2017-07-10 ENCOUNTER — Ambulatory Visit (INDEPENDENT_AMBULATORY_CARE_PROVIDER_SITE_OTHER): Payer: BLUE CROSS/BLUE SHIELD | Admitting: Obstetrics and Gynecology

## 2017-07-10 VITALS — BP 118/68 | HR 76 | Resp 16 | Ht 63.75 in | Wt 139.0 lb

## 2017-07-10 DIAGNOSIS — Z01419 Encounter for gynecological examination (general) (routine) without abnormal findings: Secondary | ICD-10-CM | POA: Diagnosis not present

## 2017-07-10 MED ORDER — FLUOXETINE HCL 40 MG PO CAPS: ORAL_CAPSULE | ORAL | 3 refills | Status: DC

## 2017-07-10 MED ORDER — ESTRADIOL 0.0375 MG/24HR TD PTTW: 1.0000 | MEDICATED_PATCH | TRANSDERMAL | 3 refills | Status: DC

## 2017-07-10 NOTE — Progress Notes (Signed)
66 y.o. [id number] [name] here for annual exam.    On Vivelle Dot 0.05 mg. Can have hot flashes when it runs out.   Takes Prozac.  Started this after her divorce.  Gets stressed but does not suffer from anxiety as long as she the medication. Wants to continue.  Denies depression.   Not taking her cholesterol medication.   ROS  - thin skin and bruising.   Dad died in the last year from MI.  Best friend died from pancreatic cancer.  PCP:   Billey Gosling, MD  Patient's last menstrual period was 09/27/2003.           Sexually active: No.  The current method of family planning is status post hysterectomy.    Exercising: No.  takes care of grandchildren Smoker: No  Health Maintenance: Pap:  Years ago History of abnormal Pap:  no MMG:  09/07/16 BIRADS 1 negative/density d Colonoscopy:  10-06-14 polyp with Dr. Collene Mares. Next due 09/2019 BMD:   09/09/16  Result  Osteopenia TDaP:  06/30/16 Gardasil:   N/A HIV and Hep C: 06/30/16 Negative Screening Labs:  Discuss today   reports that she has quit smoking. Her smoking use included Cigarettes. She has a 20.00 pack-year smoking history. She has never used smokeless tobacco. She reports that she drinks about 1.8 - 2.4 oz of alcohol per week . She reports that she does not use drugs.  Past Medical History:  Diagnosis Date  . Anxiety   . Endometriosis   . Fibrocystic breast    muliple drainage  . Fibroid   . History of bronchitis   . Hypercholesteremia 2007   currently not taking anything for it  . Osteopenia 08/2016   hip and spine  . Urge incontinence 06/05/2013    Past Surgical History:  Procedure Laterality Date  . ABDOMINAL HYSTERECTOMY    . BLADDER SUSPENSION  2004   done with TVH  . BREAST BIOPSY  1980   done 3x  . CLOSED REDUCTION METACARPAL WITH PERCUTANEOUS PINNING Right 08/24/2016   Procedure: CLOSED REDUCTION  WITH PERCUTANEOUS PINNING;  Surgeon: Iran Planas, MD;  Location: [place];  Service: Orthopedics;   Laterality: Right;  . COLONOSCOPY    . COMBINED HYSTERECTOMY VAGINAL / OOPHORECTOMY / A&P REPAIR  2004  . FOOT SURGERY Right 1992  . OPEN REDUCTION INTERNAL FIXATION (ORIF) HAND Right 08/24/2016   Procedure: RIGHT HAND LONG RING AND SMALL FINGER  POSSIBLE ORIF;  Surgeon: Iran Planas, MD;  Location: [place];  Service: Orthopedics;  Laterality: Right;  . WISDOM TOOTH EXTRACTION  1978    Current Outpatient Prescriptions  Medication Sig Dispense Refill  . Calcium-Phosphorus-Vitamin D (CITRACAL +D3 PO) Take by mouth daily.    Marland Kitchen Dextrose, Diabetic Use, (GLUCOSE PO) Take 1 tablet by mouth daily as needed. When feels like sugar is low    . estradiol (VIVELLE-DOT) 0.05 MG/24HR patch apply 1 patch two times a week as directed 8 patch 5  . FLUoxetine (PROZAC) 40 MG capsule TAKE ONE CAPSULE BY MOUTH EVERY DAY 30 capsule 11   No current facility-administered medications for this visit.     Family History  Problem Relation Age of Onset  . Cancer Paternal Grandmother        Colon   . Cancer Paternal Grandfather        Lung  . Heart disease Mother        valvular disease, valve replacement  . Atrial fibrillation Mother   .  Stroke Mother   . Heart disease Brother        valve disease, valve surgery  . Thyroid disease Maternal Grandmother        Goiter  . Thyroid disease Daughter        Hypothyroid    ROS:  Pertinent items are noted in HPI.  Otherwise, a comprehensive ROS was negative.  Exam:   BP 118/68 (BP Location: Right Arm, Patient Position: Sitting, Cuff Size: Normal)   Pulse 76   Resp 16   Ht 5' 3.75" (1.619 m)   Wt 139 lb (63 kg)   LMP 09/27/2003   BMI 24.05 kg/m     General appearance: alert, cooperative and appears stated age Head: Normocephalic, without obvious abnormality, atraumatic Neck: no adenopathy, supple, symmetrical, trachea midline and thyroid normal to inspection and palpation Lungs: clear to auscultation bilaterally Breasts: normal appearance, no masses or  tenderness, No nipple retraction or dimpling, No nipple discharge or bleeding, No axillary or supraclavicular adenopathy Heart: regular rate and rhythm Abdomen: soft, non-tender; no masses, no organomegaly Extremities: extremities normal, atraumatic, no cyanosis or edema Skin: Skin color, texture, turgor normal. No rashes or lesions Lymph nodes: Cervical, supraclavicular, and axillary nodes normal. No abnormal inguinal nodes palpated Neurologic: Grossly normal  Pelvic: External genitalia:  no lesions              Urethra:  normal appearing urethra with no masses, tenderness or lesions              Bartholins and Skenes: normal                 Vagina: normal appearing vagina with normal color and discharge, no lesions              Cervix:  Absent.              Pap taken: No. Bimanual Exam:  Uterus:   Absent.               Adnexa: no mass, fullness, tenderness              Rectal exam: Yes.  .  Confirms.              Anus:  normal sphincter tone, no lesions  Chaperone was present for exam.  Assessment:   Well woman visit with normal exam. Status post TVH.  ERT patient.  Hyperlipidemia.  Anxiety.  Osteopenia.   Plan: Mammogram screening discussed.  Discussed 3D.  Recommended self breast awareness. Pap and HR HPV as above. Guidelines for Calcium, Vitamin D, regular exercise program including cardiovascular and weight bearing exercise. Refill of Prozac 40 mg daily.  Lowe estrogen to Minivelle 0.075 mg twice weekly.  Discussed WHI and increased risk of stroke, DVT, PE, and possible breast cancer. BMD next year.  She will see her PCP for routine labs and vaccines.  Follow up annually and prn.   After visit summary provided.

## 2017-07-10 NOTE — Patient Instructions (Signed)

## 2017-08-10 ENCOUNTER — Other Ambulatory Visit: Payer: Self-pay | Admitting: Obstetrics and Gynecology

## 2017-08-10 DIAGNOSIS — Z1231 Encounter for screening mammogram for malignant neoplasm of breast: Secondary | ICD-10-CM

## 2017-09-12 ENCOUNTER — Ambulatory Visit
Admission: RE | Admit: 2017-09-12 | Discharge: 2017-09-12 | Disposition: A | Discharge status: Home / Self Care | Payer: Medicare Other | Source: Ambulatory Visit | Attending: Obstetrics and Gynecology | Admitting: Obstetrics and Gynecology

## 2017-09-12 DIAGNOSIS — Z1231 Encounter for screening mammogram for malignant neoplasm of breast: Secondary | ICD-10-CM

## 2017-09-28 NOTE — Progress Notes (Signed)
Subjective:    Patient ID: Marissa Lee, female    DOB: November 29, 1950, 67 y.o.   MRN: [id number]  HPI Here for a welcome to medicare wellness exam and a physical exam  I have personally reviewed and have noted 1.The patient's medical and social history 2.Their use of alcohol, tobacco or illicit drugs 3.Their current medications and supplements 4.The patient's functional ability including ADL's, fall risks, home                 safety risk and hearing or visual impairment. 5.Diet and physical activities 6.Evidence for depression or mood disorders 7.Care team reviewed  - GYN - Dr Quincy Simmonds, eye doctor, dentist    Are there smokers in your home (other than you)? 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  She does not cook.   Eats a lot of fruit.    Vitamin and supplement use:   Taking calcium and vitamin d daily  Opiod use:   none   Cardiac risk factors: advanced age, hyperlipidemia  Depression Screen  Have you felt down, depressed or hopeless? No  Have you felt little interest or pleasure in doing things?  No  Activities of Daily Living In your present state of health, do you have any difficulty performing the following activities?:  Driving? No Managing money?  No Feeding yourself? No Getting from bed to chair? No Climbing a flight of stairs? No Preparing food and eating?: No Bathing or showering? No Getting dressed: No Getting to/using the toilet? No Moving around from place to place: No In the past year have you fallen or had a near fall?: No   Are you sexually active?  No  Do you have more than one partner?  N/A  Hearing Difficulties:  Do you often ask people to speak up or repeat themselves? No Do you experience ringing or noises in your  ears? No Do you have difficulty understanding soft or whispered voices? yes Vision:              Any change in vision:  No , except mild age related changes             Up to date with eye exam:   yes  Memory:  Do you feel that you have a problem with memory? No  Do you often misplace items? No  Do you feel safe at home?  Yes  Cognitive Testing  Alert, Orientated? Yes  Normal Appearance? Yes  Recall of three objects?  Yes  Can perform simple calculations? Yes  Displays appropriate judgment? Yes  Can read the correct time from a watch face? Yes   Advanced Directives have been discussed with the patient? Yes      Medications and allergies reviewed with patient and updated if appropriate.  Patient Active Problem List   Diagnosis Date Noted  . Osteopenia 09/28/2016  . Hyperlipidemia 09/28/2016  . 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 Vivid dream 07/20/2015  . Constipation 09/06/2013  . Post-menopause on HRT (hormone replacement therapy) 06/05/2013    Current Outpatient Medications on File Prior to Visit  Medication Sig Dispense Refill  . Calcium-Phosphorus-Vitamin D (CITRACAL +D3 PO) Take by mouth daily.    Marland Kitchen Dextrose, Diabetic Use, (GLUCOSE PO) Take 1 tablet  by mouth daily as needed. When feels like sugar is low    . estradiol (MINIVELLE) 0.0375 MG/24HR Place 1 patch onto the skin 2 (two) times a week. 12 patch 3  . FLUoxetine (PROZAC) 40 MG capsule TAKE ONE CAPSULE BY MOUTH EVERY DAY 90 capsule 3   No current facility-administered medications on file prior to visit.     Past Medical History:  Diagnosis Date  . Anxiety   . Endometriosis   . Fibrocystic breast    muliple drainage  . Fibroid   . History of bronchitis   . Hypercholesteremia 2007   currently not taking anything for it  . Osteopenia 08/2016   hip and spine  . Urge incontinence 06/05/2013    Past Surgical History:  Procedure Laterality Date  .  ABDOMINAL HYSTERECTOMY    . BLADDER SUSPENSION  2004   done with [facility]  . BREAST BIOPSY  1980   done 3x  . CLOSED REDUCTION METACARPAL WITH PERCUTANEOUS PINNING Right 08/24/2016   Procedure: CLOSED REDUCTION  WITH PERCUTANEOUS PINNING;  Surgeon: Iran Planas, MD;  Location: [facility];  Service: Orthopedics;  Laterality: Right;  . COLONOSCOPY    . COMBINED HYSTERECTOMY VAGINAL / OOPHORECTOMY / A&P REPAIR  2004  . FOOT SURGERY Right 1992  . OPEN REDUCTION INTERNAL FIXATION (ORIF) HAND Right 08/24/2016   Procedure: RIGHT HAND LONG RING AND SMALL FINGER  POSSIBLE ORIF;  Surgeon: Iran Planas, MD;  Location: [facility];  Service: Orthopedics;  Laterality: Right;  . WISDOM TOOTH EXTRACTION  1978    Social History   Socioeconomic History  . Marital status: Single    Spouse name: None  . Number of children: None  . Years of education: None  . Highest education level: None  Social Needs  . Financial resource strain: None  . Food insecurity - worry: None  . Food insecurity - inability: None  . Transportation needs - medical: None  . Transportation needs - non-medical: None  Occupational History  . None  Tobacco Use  . Smoking status: Former Smoker    Packs/day: 1.00    Years: 20.00    Pack years: 20.00    Types: Cigarettes  . Smokeless tobacco: Never Used  . Tobacco comment: quit in approximately 2000  Substance and Sexual Activity  . Alcohol use: Yes    Alcohol/week: 1.8 - 2.4 oz    Types: 2 Cans of beer, 1 - 2 Standard drinks or equivalent per week    Comment: rarely  . Drug use: No  . Sexual activity: No    Partners: Male    Birth control/protection: Surgical    Comment: Hyst  Other Topics Concern  . None  Social History Narrative  . None    Family History  Problem Relation Age of Onset  . Cancer Paternal Grandmother        Colon   . Cancer Paternal Grandfather        Lung  . Heart disease Mother        valvular disease, valve replacement  . Atrial fibrillation Mother     . Stroke Mother   . Heart disease Brother        valve disease, valve surgery  . Thyroid disease Maternal Grandmother        Goiter  . Thyroid disease Daughter        Hypothyroid    Review of Systems  Constitutional: Negative for chills and fever.  HENT: Positive for hearing loss (minimal) and  tinnitus.   Eyes: Negative for visual disturbance (mild age related changes only).  Respiratory: Negative for cough, shortness of breath and wheezing.   Cardiovascular: Negative for chest pain, palpitations and leg swelling.  Gastrointestinal: Positive for constipation (diet related). Negative for abdominal pain, blood in stool, diarrhea and nausea.  Genitourinary: Negative for dysuria and hematuria.  Musculoskeletal: Negative for arthralgias and back pain.  Skin: Negative for color change and rash.  Neurological: Negative for light-headedness and headaches.  Psychiatric/Behavioral: Negative for dysphoric mood. The patient is nervous/anxious (controlled with medication).        Objective:   Vitals:   09/29/17 1050  BP: 130/72  Pulse: 75  Resp: 16  Temp: 98.4 F (36.9 C)  SpO2: 97%   Filed Weights   09/29/17 1050  Weight: 141 lb (64 kg)   Body mass index is 24.39 kg/m.  Wt Readings from Last 3 Encounters:  09/29/17 141 lb (64 kg)  07/10/17 139 lb (63 kg)  10/10/16 141 lb (64 kg)    No exam data present    Physical Exam Constitutional: She appears well-developed and well-nourished. No distress.  HENT:  Head: Normocephalic and atraumatic.  Right Ear: External ear normal. Normal ear canal and TM Left Ear: External ear normal.  Normal ear canal and TM Mouth/Throat: Oropharynx is clear and moist.  Eyes: Conjunctivae and EOM are normal.  Neck: Neck supple. No tracheal deviation present. No thyromegaly present.  No carotid bruit  Cardiovascular: Normal rate, regular rhythm and normal heart sounds.   No murmur heard.  No edema. Pulmonary/Chest: Effort normal and breath  sounds normal. No respiratory distress. She has no wheezes. She has no rales.  Breast: deferred to Gyn Abdominal: Soft. She exhibits no distension. There is no tenderness.  Lymphadenopathy: She has no cervical adenopathy.  Skin: Skin is warm and dry. She is not diaphoretic.  Psychiatric: She has a normal mood and affect. Her behavior is normal.        Assessment & Plan:   Wellness Exam: Immunizations  Flu today, prevnar due, shingrix discussed, td up to date Colonoscopy  Up to date  Mammogram   Up to date  Dexa   Up to date  Gyn Up to date  Eye exam   Up to date  Hearing loss    Very minimal - does not feel she needs evaluation at this time.  Memory concerns/difficulties  none Independent of ADLs    fully Stressed the importance of regular exercise EKG: NSR at 63 bpm, normal EKG, no change from prior 01/2016  Patient received copy of preventative screening tests/immunizations recommended for the next 5-10 years.    Physical exam: Screening blood work Immunizations  Flu today, prevnar due, shingrix discussed, td up to date Colonoscopy  Up to date  Mammogram   Up to date  Dexa   Up to date  Gyn  Up to date  Eye exam  Up to date  EKG   NSR at 63 bpm, normal EKG, no change from prior 01/2016 Exercise none -- stress regular exercise Weight  normal BMI Skin   No concerns Substance abuse  none  See Problem List for Assessment and Plan of chronic medical problems.     FU in one year

## 2017-09-28 NOTE — Patient Instructions (Addendum)
Marissa Lee , Thank you for taking time to come for your Medicare Wellness Visit. I appreciate your ongoing commitment to your health goals. Please review the following plan we discussed and let me know if I can assist you in the future.   These are the goals we discussed: Goals    Start exercising regularly      This is a list of the screening recommended for you and due dates:  Health Maintenance  Topic Date Due  . Pap Smear  08/27/1973  . Flu Shot  04/26/2017  . Pneumonia vaccines (1 of 2 - PCV13) 08/27/2017  . DEXA scan (bone density measurement)  09/09/2018  . Mammogram  09/13/2019  . Colon Cancer Screening  10/06/2024  . Tetanus Vaccine  06/30/2026  .  Hepatitis C: One time screening is recommended by Center for Disease Control  (CDC) for  adults born from [birth year] through 1964.   Completed  . HIV Screening  Completed     Test(s) ordered today. Your results will be released to [facility] (or called to you) after review, usually within 72hours after test completion. If any changes need to be made, you will be notified at that same time.  All other Health Maintenance issues reviewed.   All recommended immunizations and age-appropriate screenings are up-to-date or discussed.  No immunizations administered today.   Medications reviewed and updated.  No changes recommended at this time.   Please followup in one year for a physical   Health Maintenance, Female Adopting a healthy lifestyle and getting preventive care can go a long way to promote health and wellness. Talk with your health care provider about what schedule of regular examinations is right for you. This is a good chance for you to check in with your provider about disease prevention and staying healthy. In between checkups, there are plenty of things you can do on your own. Experts have done a lot of research about which lifestyle changes and preventive measures are most likely to keep you healthy. Ask your health care  provider for more information. Weight and diet Eat a healthy diet  Be sure to include plenty of vegetables, fruits, low-fat dairy products, and lean protein.  Do not eat a lot of foods high in solid fats, added sugars, or salt.  Get regular exercise. This is one of the most important things you can do for your health. ? Most adults should exercise for at least 150 minutes each week. The exercise should increase your heart rate and make you sweat (moderate-intensity exercise). ? Most adults should also do strengthening exercises at least twice a week. This is in addition to the moderate-intensity exercise.  Maintain a healthy weight  Body mass index (BMI) is a measurement that can be used to identify possible weight problems. It estimates body fat based on height and weight. Your health care provider can help determine your BMI and help you achieve or maintain a healthy weight.  For females 46 years of age and older: ? A BMI below 18.5 is considered underweight. ? A BMI of 18.5 to 24.9 is normal. ? A BMI of 25 to 29.9 is considered overweight. ? A BMI of 30 and above is considered obese.  Watch levels of cholesterol and blood lipids  You should start having your blood tested for lipids and cholesterol at 66 years of age, then have this test every 5 years.  You may need to have your cholesterol levels checked more often if: ? Your  lipid or cholesterol levels are high. ? You are older than 66 years of age. ? You are at high risk for heart disease.  Cancer screening Lung Cancer  Lung cancer screening is recommended for adults 35-9 years old who are at high risk for lung cancer because of a history of smoking.  A yearly low-dose CT scan of the lungs is recommended for people who: ? Currently smoke. ? Have quit within the past 15 years. ? Have at least a 30-pack-year history of smoking. A pack year is smoking an average of one pack of cigarettes a day for 1 year.  Yearly screening  should continue until it has been 15 years since you quit.  Yearly screening should stop if you develop a health problem that would prevent you from having lung cancer treatment.  Breast Cancer  Practice breast self-awareness. This means understanding how your breasts normally appear and feel.  It also means doing regular breast self-exams. Let your health care provider know about any changes, no matter how small.  If you are in your 20s or 30s, you should have a clinical breast exam (CBE) by a health care provider every 1-3 years as part of a regular health exam.  If you are 61 or older, have a CBE every year. Also consider having a breast X-ray (mammogram) every year.  If you have a family history of breast cancer, talk to your health care provider about genetic screening.  If you are at high risk for breast cancer, talk to your health care provider about having an MRI and a mammogram every year.  Breast cancer gene (BRCA) assessment is recommended for women who have family members with BRCA-related cancers. BRCA-related cancers include: ? Breast. ? Ovarian. ? Tubal. ? Peritoneal cancers.  Results of the assessment will determine the need for genetic counseling and BRCA1 and BRCA2 testing.  Cervical Cancer Your health care provider may recommend that you be screened regularly for cancer of the pelvic organs (ovaries, uterus, and vagina). This screening involves a pelvic examination, including checking for microscopic changes to the surface of your cervix (Pap test). You may be encouraged to have this screening done every 3 years, beginning at age 36.  For women ages 62-65, health care providers may recommend pelvic exams and Pap testing every 3 years, or they may recommend the Pap and pelvic exam, combined with testing for human papilloma virus (HPV), every 5 years. Some types of HPV increase your risk of cervical cancer. Testing for HPV may also be done on women of any age with  unclear Pap test results.  Other health care providers may not recommend any screening for nonpregnant women who are considered low risk for pelvic cancer and who do not have symptoms. Ask your health care provider if a screening pelvic exam is right for you.  If you have had past treatment for cervical cancer or a condition that could lead to cancer, you need Pap tests and screening for cancer for at least 20 years after your treatment. If Pap tests have been discontinued, your risk factors (such as having a new sexual partner) need to be reassessed to determine if screening should resume. Some women have medical problems that increase the chance of getting cervical cancer. In these cases, your health care provider may recommend more frequent screening and Pap tests.  Colorectal Cancer  This type of cancer can be detected and often prevented.  Routine colorectal cancer screening usually begins at 66 years of  age and continues through 66 years of age.  Your health care provider may recommend screening at an earlier age if you have risk factors for colon cancer.  Your health care provider may also recommend using home test kits to check for hidden blood in the stool.  A small camera at the end of a tube can be used to examine your colon directly (sigmoidoscopy or colonoscopy). This is done to check for the earliest forms of colorectal cancer.  Routine screening usually begins at age 77.  Direct examination of the colon should be repeated every 5-10 years through 66 years of age. However, you may need to be screened more often if early forms of precancerous polyps or small growths are found.  Skin Cancer  Check your skin from head to toe regularly.  Tell your health care provider about any new moles or changes in moles, especially if there is a change in a mole's shape or color.  Also tell your health care provider if you have a mole that is larger than the size of a pencil  eraser.  Always use sunscreen. Apply sunscreen liberally and repeatedly throughout the day.  Protect yourself by wearing long sleeves, pants, a wide-brimmed hat, and sunglasses whenever you are outside.  Heart disease, diabetes, and high blood pressure  High blood pressure causes heart disease and increases the risk of stroke. High blood pressure is more likely to develop in: ? People who have blood pressure in the high end of the normal range (130-139/85-89 mm Hg). ? People who are overweight or obese. ? People who are African American.  If you are 16-60 years of age, have your blood pressure checked every 3-5 years. If you are 28 years of age or older, have your blood pressure checked every year. You should have your blood pressure measured twice-once when you are at a hospital or clinic, and once when you are not at a hospital or clinic. Record the average of the two measurements. To check your blood pressure when you are not at a hospital or clinic, you can use: ? An automated blood pressure machine at a pharmacy. ? A home blood pressure monitor.  If you are between 28 years and 51 years old, ask your health care provider if you should take aspirin to prevent strokes.  Have regular diabetes screenings. This involves taking a blood sample to check your fasting blood sugar level. ? If you are at a normal weight and have a low risk for diabetes, have this test once every three years after 66 years of age. ? If you are overweight and have a high risk for diabetes, consider being tested at a younger age or more often. Preventing infection Hepatitis B  If you have a higher risk for hepatitis B, you should be screened for this virus. You are considered at high risk for hepatitis B if: ? You were born in a country where hepatitis B is common. Ask your health care provider which countries are considered high risk. ? Your parents were born in a high-risk country, and you have not been immunized  against hepatitis B (hepatitis B vaccine). ? You have HIV or AIDS. ? You use needles to inject street drugs. ? You live with someone who has hepatitis B. ? You have had sex with someone who has hepatitis B. ? You get hemodialysis treatment. ? You take certain medicines for conditions, including cancer, organ transplantation, and autoimmune conditions.  Hepatitis C  Blood testing  is recommended for: ? Everyone born from [birth year] through 1964. ? Anyone with known risk factors for hepatitis C.  Sexually transmitted infections (STIs)  You should be screened for sexually transmitted infections (STIs) including gonorrhea and chlamydia if: ? You are sexually active and are younger than 66 years of age. ? You are older than 66 years of age and your health care provider tells you that you are at risk for this type of infection. ? Your sexual activity has changed since you were last screened and you are at an increased risk for chlamydia or gonorrhea. Ask your health care provider if you are at risk.  If you do not have HIV, but are at risk, it may be recommended that you take a prescription medicine daily to prevent HIV infection. This is called pre-exposure prophylaxis (PrEP). You are considered at risk if: ? You are sexually active and do not regularly use condoms or know the HIV status of your partner(s). ? You take drugs by injection. ? You are sexually active with a partner who has HIV.  Talk with your health care provider about whether you are at high risk of being infected with HIV. If you choose to begin PrEP, you should first be tested for HIV. You should then be tested every 3 months for as long as you are taking PrEP. Pregnancy  If you are premenopausal and you may become pregnant, ask your health care provider about preconception counseling.  If you may become pregnant, take 400 to 800 micrograms (mcg) of folic acid every day.  If you want to prevent pregnancy, talk to your health  care provider about birth control (contraception). Osteoporosis and menopause  Osteoporosis is a disease in which the bones lose minerals and strength with aging. This can result in serious bone fractures. Your risk for osteoporosis can be identified using a bone density scan.  If you are 34 years of age or older, or if you are at risk for osteoporosis and fractures, ask your health care provider if you should be screened.  Ask your health care provider whether you should take a calcium or vitamin D supplement to lower your risk for osteoporosis.  Menopause may have certain physical symptoms and risks.  Hormone replacement therapy may reduce some of these symptoms and risks. Talk to your health care provider about whether hormone replacement therapy is right for you. Follow these instructions at home:  Schedule regular health, dental, and eye exams.  Stay current with your immunizations.  Do not use any tobacco products including cigarettes, chewing tobacco, or electronic cigarettes.  If you are pregnant, do not drink alcohol.  If you are breastfeeding, limit how much and how often you drink alcohol.  Limit alcohol intake to no more than 1 drink per day for nonpregnant women. One drink equals 12 ounces of beer, 5 ounces of wine, or 1 ounces of hard liquor.  Do not use street drugs.  Do not share needles.  Ask your health care provider for help if you need support or information about quitting drugs.  Tell your health care provider if you often feel depressed.  Tell your health care provider if you have ever been abused or do not feel safe at home. This information is not intended to replace advice given to you by your health care provider. Make sure you discuss any questions you have with your health care provider. Document Released: 03/28/2011 Document Revised: 02/18/2016 Document Reviewed: 06/16/2015 Elsevier Interactive Patient Education  2018 [place].

## 2017-09-29 ENCOUNTER — Encounter: Payer: Self-pay | Admitting: Internal Medicine

## 2017-09-29 ENCOUNTER — Other Ambulatory Visit (INDEPENDENT_AMBULATORY_CARE_PROVIDER_SITE_OTHER): Payer: BLUE CROSS/BLUE SHIELD

## 2017-09-29 ENCOUNTER — Ambulatory Visit (INDEPENDENT_AMBULATORY_CARE_PROVIDER_SITE_OTHER): Payer: BLUE CROSS/BLUE SHIELD | Admitting: Internal Medicine

## 2017-09-29 VITALS — BP 130/72 | HR 75 | Temp 98.4°F | Resp 16 | Ht 63.75 in | Wt 141.0 lb

## 2017-09-29 DIAGNOSIS — E7849 Other hyperlipidemia: Secondary | ICD-10-CM | POA: Diagnosis not present

## 2017-09-29 DIAGNOSIS — F419 Anxiety disorder, unspecified: Secondary | ICD-10-CM

## 2017-09-29 DIAGNOSIS — M8580 Other specified disorders of bone density and structure, unspecified site: Secondary | ICD-10-CM

## 2017-09-29 DIAGNOSIS — Z0000 Encounter for general adult medical examination without abnormal findings: Secondary | ICD-10-CM

## 2017-09-29 DIAGNOSIS — Z23 Encounter for immunization: Secondary | ICD-10-CM

## 2017-09-29 LAB — CBC WITH DIFFERENTIAL/PLATELET
Basophils Absolute: 0.1 10*3/uL (ref 0.0–0.1)
Basophils Relative: 0.9 % (ref 0.0–3.0)
EOS PCT: 0.9 % (ref 0.0–5.0)
Eosinophils Absolute: 0.1 10*3/uL (ref 0.0–0.7)
HEMATOCRIT: 43.7 % (ref 36.0–46.0)
HEMOGLOBIN: 14.6 g/dL (ref 12.0–15.0)
LYMPHS ABS: 1.5 10*3/uL (ref 0.7–4.0)
Lymphocytes Relative: 25.3 % (ref 12.0–46.0)
MCHC: 33.3 g/dL (ref 30.0–36.0)
MCV: 96.5 fl (ref 78.0–100.0)
MONOS PCT: 10.3 % (ref 3.0–12.0)
Monocytes Absolute: 0.6 10*3/uL (ref 0.1–1.0)
Neutro Abs: 3.8 10*3/uL (ref 1.4–7.7)
Neutrophils Relative %: 62.6 % (ref 43.0–77.0)
Platelets: 344 10*3/uL (ref 150.0–400.0)
RBC: 4.53 Mil/uL (ref 3.87–5.11)
RDW: 13.1 % (ref 11.5–15.5)
WBC: 6 10*3/uL (ref 4.0–10.5)

## 2017-09-29 LAB — COMPREHENSIVE METABOLIC PANEL
ALK PHOS: 48 U/L (ref 39–117)
ALT: 16 U/L (ref 0–35)
AST: 24 U/L (ref 0–37)
Albumin: 4.2 g/dL (ref 3.5–5.2)
BUN: 12 mg/dL (ref 6–23)
CO2: 29 mEq/L (ref 19–32)
Calcium: 9.1 mg/dL (ref 8.4–10.5)
Chloride: 103 mEq/L (ref 96–112)
Creatinine, Ser: 0.77 mg/dL (ref 0.40–1.20)
GFR: 79.94 mL/min (ref >60.00)
GLUCOSE: 85 mg/dL (ref 70–99)
POTASSIUM: 4.8 meq/L (ref 3.5–5.1)
Sodium: 139 mEq/L (ref 135–145)
TOTAL PROTEIN: 7.2 g/dL (ref 6.0–8.3)
Total Bilirubin: 0.6 mg/dL (ref 0.2–1.2)

## 2017-09-29 LAB — LIPID PANEL
Cholesterol: 294 mg/dL — ABNORMAL HIGH (ref 0–200)
HDL: 58.3 mg/dL (ref >39.00)
LDL Cholesterol: 204 mg/dL — ABNORMAL HIGH (ref 0–99)
NONHDL: 235.85
Total CHOL/HDL Ratio: 5
Triglycerides: 160 mg/dL — ABNORMAL HIGH (ref 0.0–149.0)
VLDL: 32 mg/dL (ref 0.0–40.0)

## 2017-09-29 LAB — TSH: TSH: 2.35 u[IU]/mL (ref 0.35–4.50)

## 2017-09-29 NOTE — Assessment & Plan Note (Addendum)
Tried statins - had heart racing and metallic taste in mouth Check lipid panel  Regular exercise and healthy diet encouraged May consider zetia - prescribed last year, but she did not take it

## 2017-09-29 NOTE — Assessment & Plan Note (Signed)
Controlled, stable Continue current dose of medication  

## 2017-09-29 NOTE — Assessment & Plan Note (Signed)
dexa up to date Taking calcium and vitamin d Stressed regular exercise

## 2017-10-16 ENCOUNTER — Ambulatory Visit (INDEPENDENT_AMBULATORY_CARE_PROVIDER_SITE_OTHER): Payer: Medicare Other

## 2017-10-16 ENCOUNTER — Telehealth: Payer: Self-pay

## 2017-10-16 DIAGNOSIS — Z23 Encounter for immunization: Secondary | ICD-10-CM

## 2017-10-16 DIAGNOSIS — Z299 Encounter for prophylactic measures, unspecified: Secondary | ICD-10-CM

## 2017-10-16 NOTE — Telephone Encounter (Signed)
I would advise trying to continue the zetia for now - I do not know if the side effects will get better/stay the same or get worse - her cholesterol is very high and ideally she needs to be on medication.

## 2017-10-16 NOTE — Telephone Encounter (Signed)
Patient came in today to get prevnar 13 and just wanted to make sure she should continue taking ezetimibe for cholesterol help----she's been taking it for about 10 days and it causes her stomach to rumble a lot ( a little bit of diarhea) but if this side effect tends to get worse, she's wanting to switch to something else---I dont see this med on her current med list but she states we prescribed it for her---routing to dr burns, please advise if this side effect will get better or worse and what your recommendations would be,  thanks

## 2017-10-17 NOTE — Telephone Encounter (Signed)
Left message advising patient of dr burns note/instructions---patient is to call back if symptoms worsen or if med cannot be tolerated well so that meds can be adjusted---can talk with tamara,RN at [facility] office if needed

## 2017-11-14 ENCOUNTER — Telehealth: Payer: Self-pay | Admitting: Obstetrics and Gynecology

## 2017-11-14 MED ORDER — ESTRADIOL 0.0375 MG/24HR TD PTTW: 1.0000 | MEDICATED_PATCH | TRANSDERMAL | 2 refills | Status: DC

## 2017-11-14 NOTE — Telephone Encounter (Signed)
Patient called and said she Estradiol patch needs prior authorization. Patient also requests the prescription be in 90 day increments.  [facility] [address]

## 2017-11-14 NOTE — Telephone Encounter (Signed)
Spoke with Marissa Lee, calling from [facility]. Patient initiated PA for estradiol patch 1.4996 MG, [name] calling to complete PA.   PA for estradiol 0.0375 mg patch approved 09/26/2017 -11/14/2018, auth # [id number].

## 2017-11-14 NOTE — Telephone Encounter (Signed)
Call returned to patient, left message. Advised returning call regarding RX dicussed previously. New Rx has been sent to [facility], f/u for filling. Return call to office with any additional questions.   Will close encounter.

## 2017-11-14 NOTE — Telephone Encounter (Signed)
Spoke with patient, advised of PA as seen below. Patient requesting new RX for 90 day supply of estradiol 0.0375 mg patch twice weekly, be sent to [facility].   Advised will review with Dr. Quincy Simmonds and return call with recommendations, patient is agreeable.   Dr. Quincy Simmonds -ok to send new RX for estradiol 0.0375 mg patch  #24/2RF until next AEX 07/16/18?

## 2017-11-14 NOTE — Telephone Encounter (Signed)
Ok to refill estradiol patch 0.0375 mg twice weekly until annual exam is due.  OK for 90 day supplies.

## 2017-12-05 ENCOUNTER — Ambulatory Visit: Payer: Self-pay | Admitting: Nurse Practitioner

## 2017-12-05 ENCOUNTER — Encounter: Payer: Self-pay | Admitting: Nurse Practitioner

## 2017-12-05 VITALS — BP 112/64 | HR 70 | Temp 98.4°F | Resp 16 | Wt 139.0 lb

## 2017-12-05 DIAGNOSIS — J069 Acute upper respiratory infection, unspecified: Secondary | ICD-10-CM

## 2017-12-05 MED ORDER — PSEUDOEPH-BROMPHEN-DM 30-2-10 MG/5ML PO SYRP: 5.0000 mL | ORAL_SOLUTION | Freq: Four times a day (QID) | ORAL | 0 refills | Status: AC | PRN

## 2017-12-05 NOTE — Progress Notes (Signed)
Subjective:     Marissa Lee is a 67 y.o. female here for evaluation of a cough. Onset of symptoms was 6 days ago. Symptoms have been gradually improving since that time. The cough is dry and nonproductive and is aggravated by nothing. Associated symptoms include: fever and wheezingsore throat and fatigue during onset of symptoms 6 days ago.  Patient continues with lingering cough, but feels she is gradually getting better. Patient does not have a history of asthma. Patient does have a history of environmental allergens. Patient has not traveled recently. Patient does not have a history of smoking.   The following portions of the patient's history were reviewed and updated as appropriate: allergies, current medications and past medical history.  Review of Systems Constitutional: positive for fatigue and fevers, negative for anorexia, malaise and sweats Eyes: negative Ears, nose, mouth, throat, and face: positive for nasal congestion and sore throat, negative for ear drainage, earaches and hoarseness Respiratory: positive for cough and wheezing, negative for asthma, chronic bronchitis, pneumonia, sputum and stridor Cardiovascular: negative Gastrointestinal: negative Neurological: negative Allergic/Immunologic: positive for hay fever    Objective:   BP 112/64 (BP Location: Right Arm, Patient Position: Sitting, Cuff Size: Normal)   Pulse 70   Temp 98.4 F (36.9 C) (Oral)   Resp 16   Wt 139 lb (63 kg)   LMP 09/27/2003   SpO2 98%   BMI 24.05 kg/m  General appearance: alert, cooperative and no distress Head: Normocephalic, without obvious abnormality, atraumatic Eyes: conjunctivae/corneas clear. PERRL, EOM's intact. Fundi benign. Ears: normal TM's and external ear canals both ears Nose: no discharge, turbinates swollen, inflamed, mild maxillary sinus tenderness bilateral, no frontal sinus tenderness bilateral Throat: lips, mucosa, and tongue normal; teeth and gums normal Lungs: clear to  auscultation bilaterally Heart: regular rate and rhythm, S1, S2 normal, no murmur, click, rub or gallop Abdomen: soft, non-tender; bowel sounds normal; no masses,  no organomegaly Pulses: 2+ and symmetric Skin: Skin color, texture, turgor normal. No rashes or lesions Lymph nodes: cervical and submandibular nodes normal Neurologic: Grossly normal    Assessment:    URI with Cough    Plan:    Explained lack of efficacy of antibiotics in viral disease. Antitussives per medication orders. Avoid exposure to tobacco smoke and fumes. Call if shortness of breath worsens, blood in sputum, change in character of cough, development of fever or chills, inability to maintain nutrition and hydration. Avoid exposure to tobacco smoke and fumes. Increase fluids, use humidifier, cough drops or honey to suppress cough.     Meds ordered this encounter  Medications  . brompheniramine-pseudoephedrine-DM 30-2-10 MG/5ML syrup    Sig: Take 5 mLs by mouth 4 (four) times daily as needed for up to 7 days.    Dispense:  150 mL    Refill:  0    Order Specific Question:   Supervising Provider    Answer:   Ricard Dillon [phone]

## 2017-12-05 NOTE — Patient Instructions (Addendum)
Viral Respiratory Infection A respiratory infection is an illness that affects part of the respiratory system, such as the lungs, nose, or throat. Most respiratory infections are caused by either viruses or bacteria. A respiratory infection that is caused by a virus is called a viral respiratory infection. Common types of viral respiratory infections include:  A cold.  The flu (influenza).  A respiratory syncytial virus (RSV) infection.  How do I know if I have a viral respiratory infection? Most viral respiratory infections cause:  A stuffy or runny nose.  Yellow or green nasal discharge.  A cough.  Sneezing.  Fatigue.  Achy muscles.  A sore throat.  Sweating or chills.  A fever.  A headache.  How are viral respiratory infections treated? If influenza is diagnosed early, it may be treated with an antiviral medicine that shortens the length of time a person has symptoms. Symptoms of viral respiratory infections may be treated with over-the-counter and prescription medicines, such as:  Expectorants. These make it easier to cough up mucus.  Decongestant nasal sprays.  Health care providers do not prescribe antibiotic medicines for viral infections. This is because antibiotics are designed to kill bacteria. They have no effect on viruses. How do I know if I should stay home from work or school? To avoid exposing others to your respiratory infection, stay home if you have:  A fever.  A persistent cough.  A sore throat.  A runny nose.  Sneezing.  Muscles aches.  Headaches.  Fatigue.  Weakness.  Chills.  Sweating.  Nausea.  Follow these instructions at home:  Rest as much as possible.  Take over-the-counter and prescription medicines only as told by your health care provider.  Drink enough fluid to keep your urine clear or pale yellow. This helps prevent dehydration and helps loosen up mucus.  Gargle with a salt-water mixture 3-4 times per day or  as needed. To make a salt-water mixture, completely dissolve -1 tsp of salt in 1 cup of warm water.  Use nose drops made from salt water to ease congestion and soften raw skin around your nose.  Do not drink alcohol.  Do not use tobacco products, including cigarettes, chewing tobacco, and e-cigarettes. If you need help quitting, ask your health care provider. Contact a health care provider if:  Your symptoms last for 10 days or longer.  Your symptoms get worse over time.  You have a fever.  You have severe sinus pain in your face or forehead.  The glands in your jaw or neck become very swollen. Get help right away if:  You feel pain or pressure in your chest.  You have shortness of breath.  You faint or feel like you will faint.  You have severe and persistent vomiting.  You feel confused or disoriented. This information is not intended to replace advice given to you by your health care provider. Make sure you discuss any questions you have with your health care provider. Document Released: 06/22/2005 Document Revised: 02/18/2016 Document Reviewed: 02/18/2015 Elsevier Interactive Patient Education  2018 [place].  Cough, Adult Coughing is a reflex that clears your throat and your airways. Coughing helps to heal and protect your lungs. It is normal to cough occasionally, but a cough that happens with other symptoms or lasts a long time may be a sign of a condition that needs treatment. A cough may last only 2-3 weeks (acute), or it may last longer than 8 weeks (chronic). What are the causes? Coughing is  commonly caused by:  Breathing in substances that irritate your lungs.  A viral or bacterial respiratory infection.  Allergies.  Asthma.  Postnasal drip.  Smoking.  Acid backing up from the stomach into the esophagus (gastroesophageal reflux).  Certain medicines.  Chronic lung problems, including COPD (or rarely, lung cancer).  Other medical conditions such  as heart failure.  Follow these instructions at home: Pay attention to any changes in your symptoms. Take these actions to help with your discomfort:  Take medicines only as told by your health care provider. ? If you were prescribed an antibiotic medicine, take it as told by your health care provider. Do not stop taking the antibiotic even if you start to feel better. ? Talk with your health care provider before you take a cough suppressant medicine.  Drink enough fluid to keep your urine clear or pale yellow.  If the air is dry, use a cold steam vaporizer or humidifier in your bedroom or your home to help loosen secretions.  Avoid anything that causes you to cough at work or at home.  If your cough is worse at night, try sleeping in a semi-upright position.  Avoid cigarette smoke. If you smoke, quit smoking. If you need help quitting, ask your health care provider.  Avoid caffeine.  Avoid alcohol.  Rest as needed.  Contact a health care provider if:  You have new symptoms.  You cough up pus.  Your cough does not get better after 2-3 weeks, or your cough gets worse.  You cannot control your cough with suppressant medicines and you are losing sleep.  You develop pain that is getting worse or pain that is not controlled with pain medicines.  You have a fever.  You have unexplained weight loss.  You have night sweats. Get help right away if:  You cough up blood.  You have difficulty breathing.  Your heartbeat is very fast. This information is not intended to replace advice given to you by your health care provider. Make sure you discuss any questions you have with your health care provider. Document Released: 03/11/2011 Document Revised: 02/18/2016 Document Reviewed: 11/19/2014 Elsevier Interactive Patient Education  [employer].

## 2018-01-31 ENCOUNTER — Telehealth: Payer: Self-pay | Admitting: Emergency Medicine

## 2018-01-31 DIAGNOSIS — E7849 Other hyperlipidemia: Secondary | ICD-10-CM

## 2018-01-31 NOTE — Telephone Encounter (Signed)
Copied from [place] [phone]. Topic: Appointment Scheduling - Scheduling Inquiry for Clinic >> Jan 31, 2018  4:05 PM Clack, Laban Emperor wrote: Reason for CRM: Pt would like to come in to have her cholesterol checked. If PCP is able to put order in, please contact pt to make appt.

## 2018-01-31 NOTE — Telephone Encounter (Signed)
SPoke with pt, per Dr Quay Burow okay to put blood work in. Blood work entered.

## 2018-02-01 ENCOUNTER — Other Ambulatory Visit (INDEPENDENT_AMBULATORY_CARE_PROVIDER_SITE_OTHER): Payer: BLUE CROSS/BLUE SHIELD

## 2018-02-01 DIAGNOSIS — E7849 Other hyperlipidemia: Secondary | ICD-10-CM | POA: Diagnosis not present

## 2018-02-01 LAB — LIPID PANEL
Cholesterol: 216 mg/dL — ABNORMAL HIGH (ref 0–200)
HDL: 56.7 mg/dL (ref >39.00)
LDL Cholesterol: 139 mg/dL — ABNORMAL HIGH (ref 0–99)
NONHDL: 159.39
Total CHOL/HDL Ratio: 4
Triglycerides: 104 mg/dL (ref 0.0–149.0)
VLDL: 20.8 mg/dL (ref 0.0–40.0)

## 2018-02-01 LAB — HEPATIC FUNCTION PANEL
ALK PHOS: 38 U/L — AB (ref 39–117)
ALT: 9 U/L (ref 0–35)
AST: 12 U/L (ref 0–37)
Albumin: 4 g/dL (ref 3.5–5.2)
BILIRUBIN TOTAL: 0.6 mg/dL (ref 0.2–1.2)
Bilirubin, Direct: 0.1 mg/dL (ref 0.0–0.3)
Total Protein: 6.7 g/dL (ref 6.0–8.3)

## 2018-02-22 ENCOUNTER — Other Ambulatory Visit: Payer: Self-pay | Admitting: Internal Medicine

## 2018-02-22 MED ORDER — EZETIMIBE 10 MG PO TABS: 10.0000 mg | ORAL_TABLET | Freq: Every day | ORAL | 2 refills | Status: DC

## 2018-02-22 NOTE — Telephone Encounter (Signed)
LOV  09/29/17  Dr. Quay Burow Last refill 09/28/17 No provider name with medication.

## 2018-02-22 NOTE — Telephone Encounter (Signed)
Copied from [place] [phone]. Topic: Quick Communication - Rx Refill/Question >> Feb 22, 2018  9:04 AM Synthia Innocent wrote: Medication:ezetimibe (ZETIA) 10 MG tablet   Has the patient contacted their pharmacy? Yes.   (Agent: If no, request that the patient contact the pharmacy for the refill.) (Agent: If yes, when and what did the pharmacy advise?)  Preferred Pharmacy (with phone number or street name): [facility] at [facility]: Please be advised that RX refills may take up to 3 business days. We ask that you follow-up with your pharmacy.

## 2018-03-27 ENCOUNTER — Ambulatory Visit: Payer: Self-pay | Admitting: Family Medicine

## 2018-03-27 VITALS — BP 122/72 | HR 69 | Temp 98.6°F | Resp 18 | Wt 142.2 lb

## 2018-03-27 DIAGNOSIS — R059 Cough, unspecified: Secondary | ICD-10-CM

## 2018-03-27 DIAGNOSIS — R0981 Nasal congestion: Secondary | ICD-10-CM

## 2018-03-27 DIAGNOSIS — R52 Pain, unspecified: Secondary | ICD-10-CM

## 2018-03-27 DIAGNOSIS — J329 Chronic sinusitis, unspecified: Secondary | ICD-10-CM

## 2018-03-27 DIAGNOSIS — R05 Cough: Secondary | ICD-10-CM

## 2018-03-27 LAB — POCT URINALYSIS DIPSTICK
BILIRUBIN UA: NEGATIVE
Blood, UA: NEGATIVE
GLUCOSE UA: NEGATIVE
Ketones, UA: NEGATIVE
Leukocytes, UA: NEGATIVE
Nitrite, UA: NEGATIVE
Protein, UA: NEGATIVE
Spec Grav, UA: 1.01 (ref 1.010–1.025)
Urobilinogen, UA: 0.2 E.U./dL
pH, UA: 6 (ref 5.0–8.0)

## 2018-03-27 MED ORDER — AZELASTINE HCL 0.1 % NA SOLN: 2.0000 | Freq: Two times a day (BID) | NASAL | 12 refills | Status: DC

## 2018-03-27 MED ORDER — BENZONATATE 200 MG PO CAPS: 200.0000 mg | ORAL_CAPSULE | Freq: Three times a day (TID) | ORAL | 0 refills | Status: DC | PRN

## 2018-03-27 MED ORDER — DOXYCYCLINE HYCLATE 100 MG PO TABS: 100.0000 mg | ORAL_TABLET | Freq: Two times a day (BID) | ORAL | 0 refills | Status: DC

## 2018-03-27 NOTE — Patient Instructions (Addendum)
PLAN< Tylenol 650 mg every 6-8 hours for body aches  Sinusitis, Adult Sinusitis is soreness and inflammation of your sinuses. Sinuses are hollow spaces in the bones around your face. They are located:  Around your eyes.  In the middle of your forehead.  Behind your nose.  In your cheekbones.  Your sinuses and nasal passages are lined with a stringy fluid (mucus). Mucus normally drains out of your sinuses. When your nasal tissues get inflamed or swollen, the mucus can get trapped or blocked so air cannot flow through your sinuses. This lets bacteria, viruses, and funguses grow, and that leads to infection. Follow these instructions at home: Medicines  Take, use, or apply over-the-counter and prescription medicines only as told by your doctor. These may include nasal sprays.  If you were prescribed an antibiotic medicine, take it as told by your doctor. Do not stop taking the antibiotic even if you start to feel better. Hydrate and Humidify  Drink enough water to keep your pee (urine) clear or pale yellow.  Use a cool mist humidifier to keep the humidity level in your home above 50%.  Breathe in steam for 10-15 minutes, 3-4 times a day or as told by your doctor. You can do this in the bathroom while a hot shower is running.  Try not to spend time in cool or dry air. Rest  Rest as much as possible.  Sleep with your head raised (elevated).  Make sure to get enough sleep each night. General instructions  Put a warm, moist washcloth on your face 3-4 times a day or as told by your doctor. This will help with discomfort.  Wash your hands often with soap and water. If there is no soap and water, use hand sanitizer.  Do not smoke. Avoid being around people who are smoking (secondhand smoke).  Keep all follow-up visits as told by your doctor. This is important. Contact a doctor if:  You have a fever.  Your symptoms get worse.  Your symptoms do not get better within 10  days. Get help right away if:  You have a very bad headache.  You cannot stop throwing up (vomiting).  You have pain or swelling around your face or eyes.  You have trouble seeing.  You feel confused.  Your neck is stiff.  You have trouble breathing. This information is not intended to replace advice given to you by your health care provider. Make sure you discuss any questions you have with your health care provider. Document Released: 02/29/2008 Document Revised: 05/08/2016 Document Reviewed: 07/08/2015 Elsevier Interactive Patient Education  [employer].

## 2018-03-27 NOTE — Progress Notes (Signed)
Marissa Lee is a 67 y.o. female who presents today with concerns of nasal congestion and cough x 1 week- she has attempted OTC mucinex with little relief. She denies fever and also complains of lower back pain. She reports caring for young grandchildren which could be the cause of this exacerbation of pain.  Review of Systems  Constitutional: Negative for chills, fever and malaise/fatigue.  HENT: Positive for congestion and sore throat. Negative for ear discharge, ear pain and sinus pain.   Eyes: Negative.   Respiratory: Negative for sputum production and shortness of breath.   Cardiovascular: Negative.  Negative for chest pain.  Gastrointestinal: Negative for abdominal pain, diarrhea, nausea and vomiting.  Genitourinary: Negative for dysuria, frequency, hematuria and urgency.  Musculoskeletal: Positive for back pain. Negative for myalgias.  Skin: Negative.   Neurological: Negative for headaches.  Endo/Heme/Allergies: Negative.   Psychiatric/Behavioral: Negative.     O: Vitals:   03/27/18 1129  BP: 122/72  Pulse: 69  Resp: 18  Temp: 98.6 F (37 C)  SpO2: 97%     Physical Exam  Constitutional: She is oriented to person, place, and time. Vital signs are normal. She appears well-developed and well-nourished. She is active.  Non-toxic appearance. She does not have a sickly appearance.  HENT:  Head: Normocephalic.  Right Ear: Hearing, external ear and ear canal normal. Tympanic membrane is bulging. A middle ear effusion is present.  Left Ear: Hearing, external ear and ear canal normal. A middle ear effusion is present.  Nose: Rhinorrhea present. Right sinus exhibits frontal sinus tenderness. Left sinus exhibits frontal sinus tenderness.  Mouth/Throat: Uvula is midline and oropharynx is clear and moist.  Neck: Normal range of motion. Neck supple.  Cardiovascular: Normal rate, regular rhythm, normal heart sounds and normal pulses.  Pulmonary/Chest: Effort normal and breath sounds  normal.  Deep productive mildly barky cough on exam- frequent- lungs CTA  Abdominal: Soft. Bowel sounds are normal.  Musculoskeletal: Normal range of motion.  Lymphadenopathy:       Head (right side): No submental and no submandibular adenopathy present.       Head (left side): No submental and no submandibular adenopathy present.    She has no cervical adenopathy.  Neurological: She is alert and oriented to person, place, and time.  Psychiatric: She has a normal mood and affect.  Vitals reviewed.    A: 1. Sinusitis, unspecified chronicity, unspecified location   2. Nasal congestion   3. Aches      P: Exam findings, diagnosis etiology and medication use and indications reviewed with patient. Follow- Up and discharge instructions provided. No emergent/urgent issues found on exam.  Patient verbalized understanding of information provided and agrees with plan of care (POC), all questions answered.  1. Sinusitis, unspecified chronicity, unspecified location - doxycycline (VIBRA-TABS) 100 MG tablet; Take 1 tablet (100 mg total) by mouth 2 (two) times daily.  2. Nasal congestion - azelastine (ASTELIN) 0.1 % nasal spray; Place 2 sprays into both nostrils 2 (two) times daily. Use in each nostril as directed  3. Cough - benzonatate (TESSALON) 200 MG capsule; Take 1 capsule (200 mg total) by mouth 3 (three) times daily as needed for cough (with full glass of water).   4. Aches - POCT urinalysis dipstick  Results for orders placed or performed in visit on 03/27/18 (from the past 24 hour(s))  POCT urinalysis dipstick     Status: Normal   Collection Time: 03/27/18 11:35 AM  Result Value Ref Range  Color, UA     Clarity, UA     Glucose, UA Negative Negative   Bilirubin, UA Negative    Ketones, UA Negative    Spec Grav, UA 1.010 1.010 - 1.025   Blood, UA Negative    pH, UA 6.0 5.0 - 8.0   Protein, UA Negative Negative   Urobilinogen, UA 0.2 0.2 or 1.0 E.U./dL   Nitrite, UA  Negative    Leukocytes, UA Negative Negative   Appearance     Odor

## 2018-04-16 ENCOUNTER — Ambulatory Visit (HOSPITAL_COMMUNITY)
Admission: EM | Admit: 2018-04-16 | Discharge: 2018-04-16 | Disposition: A | Discharge status: Home / Self Care | Payer: BLUE CROSS/BLUE SHIELD | Attending: Family Medicine | Admitting: Family Medicine

## 2018-04-16 ENCOUNTER — Encounter (HOSPITAL_COMMUNITY): Payer: Self-pay | Admitting: Emergency Medicine

## 2018-04-16 DIAGNOSIS — M5432 Sciatica, left side: Secondary | ICD-10-CM

## 2018-04-16 MED ORDER — NAPROXEN 375 MG PO TABS: 375.0000 mg | ORAL_TABLET | Freq: Two times a day (BID) | ORAL | 0 refills | Status: DC

## 2018-04-16 MED ORDER — CYCLOBENZAPRINE HCL 5 MG PO TABS: 5.0000 mg | ORAL_TABLET | Freq: Three times a day (TID) | ORAL | 0 refills | Status: DC | PRN

## 2018-04-16 MED ORDER — METHYLPREDNISOLONE ACETATE 80 MG/ML IJ SUSP
80.0000 mg | Freq: Once | INTRAMUSCULAR | Status: AC
  Administered 2018-04-16: 80 mg via INTRAMUSCULAR

## 2018-04-16 MED ORDER — METHYLPREDNISOLONE ACETATE 80 MG/ML IJ SUSP
INTRAMUSCULAR | Status: AC
  Filled 2018-04-16: qty 1

## 2018-04-16 MED ORDER — METHYLPREDNISOLONE 4 MG PO TBPK: ORAL_TABLET | ORAL | 0 refills | Status: DC

## 2018-04-16 NOTE — ED Triage Notes (Signed)
PT reports lower back pain and pain in left leg. PT reports she picked up a heavy dog. Pain for 2 days.

## 2018-04-16 NOTE — ED Provider Notes (Signed)
Marissa Lee    CSN: [id number] Arrival date & time: 04/16/18  1044     History   Chief Complaint Chief Complaint  Patient presents with  . Back Pain  . Leg Pain    HPI Marissa Lee is a 67 y.o. female.   Marissa Lee presents with complaints of low back pain and pain which radiates to left buttock, thigh and groin. Started yesterday when she woke up. States two nights ago her granddaughter ran and jumped into her arms, [name] caught her and felt a little discomfort to her back. Woke the following morning with pain. Yesterday took ibuprofen, tizanidine, meloxicam and tramadol which did not help much with pain. Pain severe. Worse with activity such as getting in to the car or transitioning from sit to stand. Sitting is uncomfortable. Can be more comfortable with laying with knees bent. No numbness or tingling. No weakness. No urinary or stool incontinence. Denies any previous similar. Has not taken any medications for pain today. Hx of anxiety, endometriosis, fibroid, bronchitis.    ROS per HPI.      Past Medical History:  Diagnosis Date  . Anxiety   . Endometriosis   . Fibrocystic breast    muliple drainage  . Fibroid   . History of bronchitis   . Hypercholesteremia 2007   currently not taking anything for it  . Osteopenia 08/2016   hip and spine  . Urge incontinence 06/05/2013    Patient Active Problem List   Diagnosis Date Noted  . Anxiety 09/29/2017  . Osteopenia 09/28/2016  . Hyperlipidemia 09/28/2016  . 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 Vivid dream 07/20/2015  . Constipation 09/06/2013  . Post-menopause on HRT (hormone replacement therapy) 06/05/2013    Past Surgical History:  Procedure Laterality Date  . ABDOMINAL HYSTERECTOMY    . BLADDER SUSPENSION  2004   done with TVH  . BREAST BIOPSY  1980   done 3x  . CLOSED REDUCTION METACARPAL WITH PERCUTANEOUS PINNING Right 08/24/2016   Procedure:  CLOSED REDUCTION  WITH PERCUTANEOUS PINNING;  Surgeon: Iran Planas, MD;  Location: [place];  Service: Orthopedics;  Laterality: Right;  . COLONOSCOPY    . COMBINED HYSTERECTOMY VAGINAL / OOPHORECTOMY / A&P REPAIR  2004  . FOOT SURGERY Right 1992  . OPEN REDUCTION INTERNAL FIXATION (ORIF) HAND Right 08/24/2016   Procedure: RIGHT HAND LONG RING AND SMALL FINGER  POSSIBLE ORIF;  Surgeon: Iran Planas, MD;  Location: [facility];  Service: Orthopedics;  Laterality: Right;  . WISDOM TOOTH EXTRACTION  1978    OB History    Gravida  5   Para  2   Term  2   Preterm      AB  3   Living  2     SAB  3   TAB      Ectopic      Multiple      Live Births  2            Home Medications    Prior to Admission medications   Medication Sig Start Date End Date Taking? Authorizing Provider  estradiol (MINIVELLE) 0.0375 MG/24HR Place 1 patch onto the skin 2 (two) times a week. 11/16/17  Yes Nunzio Cobbs, MD  ezetimibe (ZETIA) 10 MG tablet Take 1 tablet (10 mg total) by mouth daily. 02/22/18  Yes Burns, Claudina Lick, MD  FLUoxetine (PROZAC) 40 MG capsule TAKE ONE CAPSULE BY MOUTH EVERY  DAY 07/10/17  Yes Nunzio Cobbs, MD  azelastine (ASTELIN) 0.1 % nasal spray Place 2 sprays into both nostrils 2 (two) times daily. Use in each nostril as directed 03/27/18   Shella Maxim, NP  Calcium-Phosphorus-Vitamin D (CITRACAL +D3 PO) Take by mouth daily.    [provider]  cyclobenzaprine (FLEXERIL) 5 MG tablet Take 1 tablet (5 mg total) by mouth 3 (three) times daily as needed for muscle spasms. 04/16/18   Augusto Gamble B, NP  Dextrose, Diabetic Use, (GLUCOSE PO) Take 1 tablet by mouth daily as needed. When feels like sugar is low    [provider]  methylPREDNISolone (MEDROL DOSEPAK) 4 MG TBPK tablet Per box instructions 04/16/18   Augusto Gamble B, NP  naproxen (NAPROSYN) 375 MG tablet Take 1 tablet (375 mg total) by mouth 2 (two) times daily. May start once Medrol Pack  Completed 04/16/18   Zigmund Gottron, NP    Family History Family History  Problem Relation Age of Onset  . Cancer Paternal Grandmother        Colon   . Cancer Paternal Grandfather        Lung  . Heart disease Mother        valvular disease, valve replacement  . Atrial fibrillation Mother   . Stroke Mother   . Heart disease Brother        valve disease, valve surgery  . Thyroid disease Maternal Grandmother        Goiter  . Thyroid disease Daughter        Hypothyroid    Social History Social History   Tobacco Use  . Smoking status: Former Smoker    Packs/day: 1.00    Years: 20.00    Pack years: 20.00    Types: Cigarettes  . Smokeless tobacco: Never Used  . Tobacco comment: quit in approximately 2000  Substance Use Topics  . Alcohol use: Yes    Alcohol/week: 1.8 - 2.4 oz    Types: 2 Cans of beer, 1 - 2 Standard drinks or equivalent per week    Comment: rarely  . Drug use: No     Allergies   Cephalosporins and Codeine   Review of Systems Review of Systems   Physical Exam Triage Vital Signs ED Triage Vitals  Enc Vitals Group     BP 04/16/18 1100 (!) 147/62     Pulse Rate 04/16/18 1100 62     Resp 04/16/18 1100 18     Temp 04/16/18 1100 98 F (36.7 C)     Temp Source 04/16/18 1100 Oral     SpO2 04/16/18 1100 100 %     Weight 04/16/18 1128 140 lb (63.5 kg)     Height --      Head Circumference --      Peak Flow --      Pain Score 04/16/18 1128 9     Pain Loc --      Pain Edu? --      Excl. in [place]? --    No data found.  Updated Vital Signs BP (!) 147/62 (BP Location: Right Arm)   Pulse 62   Temp 98 F (36.7 C) (Oral)   Resp 18   Wt 140 lb (63.5 kg)   LMP 09/27/2003   SpO2 100%   BMI 24.22 kg/m   Visual Acuity Right Eye Distance:   Left Eye Distance:   Bilateral Distance:    Right Eye Near:   Left Eye  Near:    Bilateral Near:     Physical Exam  Constitutional: She is oriented to person, place, and time. She appears well-developed  and well-nourished. No distress.  Cardiovascular: Normal rate, regular rhythm and normal heart sounds.  Pulmonary/Chest: Effort normal and breath sounds normal.  Musculoskeletal:       Left hip: She exhibits normal strength, no tenderness and no bony tenderness.       Lumbar back: She exhibits pain. She exhibits normal range of motion, no tenderness, no bony tenderness, no swelling, no spasm and normal pulse.  No point tenderness to back or spine, no step off or deformity; pain to left buttock and into thigh and groin; no pain with hip flexion or straight leg raise; sensation intact; strength equal bilaterally; in obvious discomfort, especially while sitting; pain with stepping up onto exam table and position transitions   Neurological: She is alert and oriented to person, place, and time.  Skin: Skin is warm and dry.     UC Treatments / Results  Labs (all labs ordered are listed, but only abnormal results are displayed) Labs Reviewed - No data to display  EKG None  Radiology No results found.  Procedures Procedures (including critical care time)  Medications Ordered in UC Medications  methylPREDNISolone acetate (DEPO-MEDROL) injection 80 mg (has no administration in time range)    Initial Impression / Assessment and Plan / UC Course  I have reviewed the triage vital signs and the nursing notes.  Pertinent labs & imaging results that were available during my care of the patient were reviewed by me and considered in my medical decision making (see chart for details).     Imaging deferred, no blunt trauma or concern for fracture at this time. Consistent with sciatica. Medrol injection today and medrol pack to start tomorrow. Naproxen to follow. Flexeril PRN, precautions provided. Light activity encouraged as tolerated and exercises provided. To follow with PCP for persistent symptoms. Patient verbalized understanding and agreeable to plan.    Final Clinical Impressions(s) / UC  Diagnoses   Final diagnoses:  Sciatica of left side     Discharge Instructions     Start medrol dose pack tomorrow.   May use muscle relaxer up to three times a day, may be most helpful at night. May cause drowsiness. Please do not take if driving or drinking alcohol.   Once Medrol dose pack completed may start naproxen twice a day. Take with food.  See exercises provided.  Light and regular activity as tolerated.  If persistent symptoms please follow up with your primary care provider.  If develop numbness or tingling, weakness, urinary or stool incontinence please return or go to Er.    ED Prescriptions    Medication Sig Dispense Auth. Provider   methylPREDNISolone (MEDROL DOSEPAK) 4 MG TBPK tablet Per box instructions 21 tablet Burky, Natalie B, NP   cyclobenzaprine (FLEXERIL) 5 MG tablet Take 1 tablet (5 mg total) by mouth 3 (three) times daily as needed for muscle spasms. 15 tablet Augusto Gamble B, NP   naproxen (NAPROSYN) 375 MG tablet Take 1 tablet (375 mg total) by mouth 2 (two) times daily. May start once Medrol Pack Completed 20 tablet Zigmund Gottron, NP     Controlled Substance Prescriptions [place] Controlled Substance Registry consulted? Not Applicable   Zigmund Gottron, NP 04/16/18 1208

## 2018-04-16 NOTE — Discharge Instructions (Signed)
Start medrol dose pack tomorrow.   May use muscle relaxer up to three times a day, may be most helpful at night. May cause drowsiness. Please do not take if driving or drinking alcohol.   Once Medrol dose pack completed may start naproxen twice a day. Take with food.  See exercises provided.  Light and regular activity as tolerated.  If persistent symptoms please follow up with your primary care provider.  If develop numbness or tingling, weakness, urinary or stool incontinence please return or go to Er.

## 2018-04-17 ENCOUNTER — Ambulatory Visit: Payer: BLUE CROSS/BLUE SHIELD | Admitting: Family

## 2018-04-24 ENCOUNTER — Ambulatory Visit: Payer: BLUE CROSS/BLUE SHIELD | Admitting: Internal Medicine

## 2018-04-24 NOTE — Progress Notes (Deleted)
Subjective:    Patient ID: Marissa Lee, female    DOB: [birth year], 67 y.o.   MRN: [id number]  HPI The patient is here for follow up.  She went on 04/16/18 to urgent care for back and leg pain.  It started the day prior - she woke up with the pain. The pain was in her lower back and radiated to her left buttock, thigh and groin.  She had tried ibuprofen, tizanidine, meloxicam and tramadol which did not help much.  The pain was severe and worse with activity.  She received a depo-medrol 80 mg IM and was prescribed a medrol pack, naproxen, felxeril as needed.   Medications and allergies reviewed with patient and updated if appropriate.  Patient Active Problem List   Diagnosis Date Noted  . Anxiety 09/29/2017  . Osteopenia 09/28/2016  . Hyperlipidemia 09/28/2016  . 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 Vivid dream 07/20/2015  . Constipation 09/06/2013  . Post-menopause on HRT (hormone replacement therapy) 06/05/2013    Current Outpatient Medications on File Prior to Visit  Medication Sig Dispense Refill  . azelastine (ASTELIN) 0.1 % nasal spray Place 2 sprays into both nostrils 2 (two) times daily. Use in each nostril as directed 30 mL 12  . Calcium-Phosphorus-Vitamin D (CITRACAL +D3 PO) Take by mouth daily.    . cyclobenzaprine (FLEXERIL) 5 MG tablet Take 1 tablet (5 mg total) by mouth 3 (three) times daily as needed for muscle spasms. 15 tablet 0  . Dextrose, Diabetic Use, (GLUCOSE PO) Take 1 tablet by mouth daily as needed. When feels like sugar is low    . estradiol (MINIVELLE) 0.0375 MG/24HR Place 1 patch onto the skin 2 (two) times a week. 24 patch 2  . ezetimibe (ZETIA) 10 MG tablet Take 1 tablet (10 mg total) by mouth daily. 90 tablet 2  . FLUoxetine (PROZAC) 40 MG capsule TAKE ONE CAPSULE BY MOUTH EVERY DAY 90 capsule 3  . methylPREDNISolone (MEDROL DOSEPAK) 4 MG TBPK tablet Per box instructions 21 tablet 0  . naproxen  (NAPROSYN) 375 MG tablet Take 1 tablet (375 mg total) by mouth 2 (two) times daily. May start once Medrol Pack Completed 20 tablet 0   No current facility-administered medications on file prior to visit.     Past Medical History:  Diagnosis Date  . Anxiety   . Endometriosis   . Fibrocystic breast    muliple drainage  . Fibroid   . History of bronchitis   . Hypercholesteremia 2007   currently not taking anything for it  . Osteopenia 08/2016   hip and spine  . Urge incontinence 06/05/2013    Past Surgical History:  Procedure Laterality Date  . ABDOMINAL HYSTERECTOMY    . BLADDER SUSPENSION  2004   done with TVH  . BREAST BIOPSY  1980   done 3x  . CLOSED REDUCTION METACARPAL WITH PERCUTANEOUS PINNING Right 08/24/2016   Procedure: CLOSED REDUCTION  WITH PERCUTANEOUS PINNING;  Surgeon: Iran Planas, MD;  Location: [facility];  Service: Orthopedics;  Laterality: Right;  . COLONOSCOPY    . COMBINED HYSTERECTOMY VAGINAL / OOPHORECTOMY / A&P REPAIR  2004  . FOOT SURGERY Right 1992  . OPEN REDUCTION INTERNAL FIXATION (ORIF) HAND Right 08/24/2016   Procedure: RIGHT HAND LONG RING AND SMALL FINGER  POSSIBLE ORIF;  Surgeon: Iran Planas, MD;  Location: [facility];  Service: Orthopedics;  Laterality: Right;  . [place]  Social History   Socioeconomic History  . Marital status: Single    Spouse name: Not on file  . Number of children: Not on file  . Years of education: Not on file  . Highest education level: Not on file  Occupational History  . Not on file  Social Needs  . Financial resource strain: Not on file  . Food insecurity:    Worry: Not on file    Inability: Not on file  . Transportation needs:    Medical: Not on file    Non-medical: Not on file  Tobacco Use  . Smoking status: Former Smoker    Packs/day: 1.00    Years: 20.00    Pack years: 20.00    Types: Cigarettes  . Smokeless tobacco: Never Used  . Tobacco comment: quit in approximately 2000    Substance and Sexual Activity  . Alcohol use: Yes    Alcohol/week: 1.8 - 2.4 oz    Types: 2 Cans of beer, 1 - 2 Standard drinks or equivalent per week    Comment: rarely  . Drug use: No  . Sexual activity: Never    Partners: Male    Birth control/protection: Surgical    Comment: Hyst  Lifestyle  . Physical activity:    Days per week: Not on file    Minutes per session: Not on file  . Stress: Not on file  Relationships  . Social connections:    Talks on phone: Not on file    Gets together: Not on file    Attends religious service: Not on file    Active member of club or organization: Not on file    Attends meetings of clubs or organizations: Not on file    Relationship status: Not on file  Other Topics Concern  . Not on file  Social History Narrative  . Not on file    Family History  Problem Relation Age of Onset  . Cancer Paternal Grandmother        Colon   . Cancer Paternal Grandfather        Lung  . Heart disease Mother        valvular disease, valve replacement  . Atrial fibrillation Mother   . Stroke Mother   . Heart disease Brother        valve disease, valve surgery  . Thyroid disease Maternal Grandmother        Goiter  . Thyroid disease Daughter        Hypothyroid    Review of Systems     Objective:  There were no vitals filed for this visit. BP Readings from Last 3 Encounters:  04/16/18 (!) 147/62  03/27/18 122/72  12/05/17 112/64   Wt Readings from Last 3 Encounters:  04/16/18 140 lb (63.5 kg)  03/27/18 142 lb 3.2 oz (64.5 kg)  12/05/17 139 lb (63 kg)   There is no height or weight on file to calculate BMI.   Physical Exam    Constitutional: Appears well-developed and well-nourished. No distress.  HENT:  Head: Normocephalic and atraumatic.  Neck: Neck supple. No tracheal deviation present. No thyromegaly present.  No cervical lymphadenopathy Cardiovascular: Normal rate, regular rhythm and normal heart sounds.   No murmur heard. No  carotid bruit .  No edema Pulmonary/Chest: Effort normal and breath sounds normal. No respiratory distress. No has no wheezes. No rales.  Skin: Skin is warm and dry. Not diaphoretic.  Psychiatric: Normal mood and affect. Behavior is normal.  Assessment & Plan:    See Problem List for Assessment and Plan of chronic medical problems.

## 2018-04-25 NOTE — Progress Notes (Signed)
Subjective:    Patient ID: Marissa Lee, female    DOB: [birth year], 67 y.o.   MRN: [id number]  HPI The patient is here for follow up.  She went on 04/16/18 to urgent care for back and leg pain.  It started the day prior - she woke up with the pain. The pain was in her lower back and radiated to her left buttock and groin.  She states it almost feels like the pain goes directly through to the groin.  She had tried ibuprofen, tizanidine, meloxicam and tramadol which did not help much.  The pain was severe and worse with activity.  At the urgent care she received a depo-medrol 80 mg IM and was prescribed a medrol pack, naproxen, flexeril as needed.  She took the flexeril and steroids and they helped minimally.  Naproxen did not help at all.  The Flexeril did help some with spasm she was having in her left leg.  She was not able to get in here the next week and did start seeing a chiropractor.  The pain has improved slightly, but she is still in severe, 10/10 pain.  She did have some leftover oxycodone from a prior dental procedure and she did take that in the last 24 hours and they did give her some relief from her pain.  That is the only thing that has helped.  She is never had pain like this before, but has had some mild back pain.  Her pain is currently in the left lower back just left of the spine.  She has pain in the left groin and has some discomfort in her left knee.  Any movement of her back causes significant pain.  She does have some intermittent spasms in the left leg.  She denies any true numbness or tingling in the leg.  She is unsure if the leg is weak because it is so hard to move.  She is having some constipation and started taking 1 stool softener a day.  She denies any bowel or bladder incontinence.  She denies any fevers or chills.     Medications and allergies reviewed with patient and updated if appropriate.  Patient Active Problem List   Diagnosis Date Noted  . Anxiety  09/29/2017  . Osteopenia 09/28/2016  . Hyperlipidemia 09/28/2016  . 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 Vivid dream 07/20/2015  . Constipation 09/06/2013  . Post-menopause on HRT (hormone replacement therapy) 06/05/2013    Current Outpatient Medications on File Prior to Visit  Medication Sig Dispense Refill  . Calcium-Phosphorus-Vitamin D (CITRACAL +D3 PO) Take by mouth daily.    . cyclobenzaprine (FLEXERIL) 5 MG tablet Take 1 tablet (5 mg total) by mouth 3 (three) times daily as needed for muscle spasms. 15 tablet 0  . Dextrose, Diabetic Use, (GLUCOSE PO) Take 1 tablet by mouth daily as needed. When feels like sugar is low    . estradiol (MINIVELLE) 0.0375 MG/24HR Place 1 patch onto the skin 2 (two) times a week. 24 patch 2  . ezetimibe (ZETIA) 10 MG tablet Take 1 tablet (10 mg total) by mouth daily. 90 tablet 2  . FLUoxetine (PROZAC) 40 MG capsule TAKE ONE CAPSULE BY MOUTH EVERY DAY 90 capsule 3  . naproxen (NAPROSYN) 375 MG tablet Take 1 tablet (375 mg total) by mouth 2 (two) times daily. May start once Medrol Pack Completed 20 tablet 0   No current facility-administered medications on file  prior to visit.     Past Medical History:  Diagnosis Date  . Anxiety   . Endometriosis   . Fibrocystic breast    muliple drainage  . Fibroid   . History of bronchitis   . Hypercholesteremia 2007   currently not taking anything for it  . Osteopenia 08/2016   hip and spine  . Urge incontinence 06/05/2013    Past Surgical History:  Procedure Laterality Date  . ABDOMINAL HYSTERECTOMY    . BLADDER SUSPENSION  2004   done with TVH  . BREAST BIOPSY  1980   done 3x  . CLOSED REDUCTION METACARPAL WITH PERCUTANEOUS PINNING Right 08/24/2016   Procedure: CLOSED REDUCTION  WITH PERCUTANEOUS PINNING;  Surgeon: Iran Planas, MD;  Location: [facility];  Service: Orthopedics;  Laterality: Right;  . COLONOSCOPY    . COMBINED HYSTERECTOMY VAGINAL /  OOPHORECTOMY / A&P REPAIR  2004  . FOOT SURGERY Right 1992  . OPEN REDUCTION INTERNAL FIXATION (ORIF) HAND Right 08/24/2016   Procedure: RIGHT HAND LONG RING AND SMALL FINGER  POSSIBLE ORIF;  Surgeon: Iran Planas, MD;  Location: [facility];  Service: Orthopedics;  Laterality: Right;  . WISDOM TOOTH EXTRACTION  1978    Social History   Socioeconomic History  . Marital status: Single    Spouse name: Not on file  . Number of children: Not on file  . Years of education: Not on file  . Highest education level: Not on file  Occupational History  . Not on file  Social Needs  . Financial resource strain: Not on file  . Food insecurity:    Worry: Not on file    Inability: Not on file  . Transportation needs:    Medical: Not on file    Non-medical: Not on file  Tobacco Use  . Smoking status: Former Smoker    Packs/day: 1.00    Years: 20.00    Pack years: 20.00    Types: Cigarettes  . Smokeless tobacco: Never Used  . Tobacco comment: quit in approximately 2000  Substance and Sexual Activity  . Alcohol use: Yes    Alcohol/week: 1.8 - 2.4 oz    Types: 2 Cans of beer, 1 - 2 Standard drinks or equivalent per week    Comment: rarely  . Drug use: No  . Sexual activity: Never    Partners: Male    Birth control/protection: Surgical    Comment: Hyst  Lifestyle  . Physical activity:    Days per week: Not on file    Minutes per session: Not on file  . Stress: Not on file  Relationships  . Social connections:    Talks on phone: Not on file    Gets together: Not on file    Attends religious service: Not on file    Active member of club or organization: Not on file    Attends meetings of clubs or organizations: Not on file    Relationship status: Not on file  Other Topics Concern  . Not on file  Social History Narrative  . Not on file    Family History  Problem Relation Age of Onset  . Cancer Paternal Grandmother        Colon   . Cancer Paternal Grandfather        Lung  . Heart  disease Mother        valvular disease, valve replacement  . Atrial fibrillation Mother   . Stroke Mother   . Heart disease Brother  valve disease, valve surgery  . Thyroid disease Maternal Grandmother        Goiter  . Thyroid disease Daughter        Hypothyroid    Review of Systems  Constitutional: Negative for chills and fever.  Gastrointestinal: Positive for constipation.  Genitourinary:       No urinary incontinence  Neurological: Negative for weakness (but difficult to walk due to pain) and numbness.       Objective:   Vitals:   04/26/18 0854  BP: (!) 164/98  Pulse: 95  SpO2: 98%   BP Readings from Last 3 Encounters:  04/26/18 (!) 164/98  04/16/18 (!) 147/62  03/27/18 122/72   Wt Readings from Last 3 Encounters:  04/26/18 141 lb (64 kg)  04/16/18 140 lb (63.5 kg)  03/27/18 142 lb 3.2 oz (64.5 kg)   Body mass index is 24.39 kg/m.   Physical Exam    Constitutional: Appears well-developed and well-nourished.  In moderate distress from pain-crying at times .  Muscular skeletal:   Left lower back slightly tender to palpation, no lumbar spine tenderness or obvious deformity.  Any movement of the back causes significant pain Neurological: Slightly increased sensation left lower extremity compared to right lower extremity, strength difficult to appreciate secondary to severe pain Skin: Skin is warm and dry. Not diaphoretic.  Psychiatric: Normal mood and affect. Behavior is normal.      Assessment & Plan:    See Problem List for Assessment and Plan of chronic medical problems.

## 2018-04-26 ENCOUNTER — Encounter: Payer: Self-pay | Admitting: Internal Medicine

## 2018-04-26 ENCOUNTER — Ambulatory Visit (INDEPENDENT_AMBULATORY_CARE_PROVIDER_SITE_OTHER)
Admission: RE | Admit: 2018-04-26 | Discharge: 2018-04-26 | Disposition: A | Discharge status: Home / Self Care | Payer: BLUE CROSS/BLUE SHIELD | Source: Ambulatory Visit | Attending: Internal Medicine | Admitting: Internal Medicine

## 2018-04-26 ENCOUNTER — Ambulatory Visit: Payer: BLUE CROSS/BLUE SHIELD | Admitting: Internal Medicine

## 2018-04-26 VITALS — BP 164/98 | HR 95 | Ht 63.75 in | Wt 141.0 lb

## 2018-04-26 DIAGNOSIS — M545 Low back pain, unspecified: Secondary | ICD-10-CM

## 2018-04-26 DIAGNOSIS — M79605 Pain in left leg: Secondary | ICD-10-CM

## 2018-04-26 MED ORDER — OXYCODONE-ACETAMINOPHEN 5-325 MG PO TABS: 1.0000 | ORAL_TABLET | ORAL | 0 refills | Status: DC | PRN

## 2018-04-26 MED ORDER — CYCLOBENZAPRINE HCL 5 MG PO TABS: 5.0000 mg | ORAL_TABLET | Freq: Three times a day (TID) | ORAL | 0 refills | Status: DC | PRN

## 2018-04-26 MED ORDER — GABAPENTIN 100 MG PO CAPS
100.0000 mg | ORAL_CAPSULE | Freq: Every day | ORAL | 3 refills | Status: DC
Start: 2018-04-26 — End: 2018-07-16

## 2018-04-26 NOTE — Patient Instructions (Addendum)
  Have an x-ray today.  I will order an MRI.      Medications reviewed and updated.  Changes include taking:  cyclobenzaprine as needed only for muscle spasms.\ Oxycodone-acetaminophen 1-2 pills every 4 hours for pain Gabapentin (nerve pain medication) at bedtime - 1 cap at night - increase to 2-3 caps if tolerated    Your prescription(s) have been submitted to your pharmacy. Please take as directed and contact our office if you believe you are having problem(s) with the medication(s).  A referral was ordered for orthopedics

## 2018-04-26 NOTE — Assessment & Plan Note (Signed)
She is experiencing constant pain for at least 10 days She has received minimal relief with pain medication, muscle relaxers and anti-inflammatories She is seeing a chiropractor, who has helped slightly Pain is severe.  Tender in left lower back.  Slightly increased sensation in left lower extremity, but weakness is difficult to determine secondary to severe pain X-ray today Given the severity of her pain, persistent radiculopathy and minimal improvement with conservative measures to date I will order an MRI-concern for herniated disc Will refer to orthopedics, may need to see neurosurgery Percocet 5-325 1 to 2 tablets every 4 hours as needed Gabapentin 100 mg at bedtime-we will increase to 300 mg if tolerated Flexeril 5 mg 3 times a day as needed for muscle spasms Avoid bending, twisting and lifting No driving while taking medication We will adjust medication as needed

## 2018-04-27 ENCOUNTER — Other Ambulatory Visit: Payer: Self-pay | Admitting: Internal Medicine

## 2018-04-27 MED ORDER — DIAZEPAM 5 MG PO TABS: ORAL_TABLET | ORAL | 0 refills | Status: DC

## 2018-04-27 NOTE — Telephone Encounter (Signed)
Looks like the order is in. Can you please check on this?

## 2018-04-27 NOTE — Telephone Encounter (Signed)
Copied from [place] [id number]. Topic: Inquiry >> Apr 27, 2018 12:03 PM Oliver Pila B wrote: Reason for CRM: pt called to check when her appt for her MRI is; contact pt to advise

## 2018-04-27 NOTE — Telephone Encounter (Signed)
Valium sent to pharmacy 

## 2018-04-27 NOTE — Telephone Encounter (Signed)
[facility] has order - called pt and she is aware

## 2018-04-27 NOTE — Telephone Encounter (Signed)
Pt needs medication due to being claustrophobic; in order to have her MRI, contact pt to advise

## 2018-04-27 NOTE — Telephone Encounter (Signed)
Pt informed of below.  

## 2018-05-01 ENCOUNTER — Ambulatory Visit
Admission: RE | Admit: 2018-05-01 | Discharge: 2018-05-01 | Disposition: A | Discharge status: Home / Self Care | Payer: Medicare Other | Source: Ambulatory Visit | Attending: Internal Medicine | Admitting: Internal Medicine

## 2018-05-01 DIAGNOSIS — M545 Low back pain, unspecified: Secondary | ICD-10-CM

## 2018-05-01 DIAGNOSIS — M79605 Pain in left leg: Principal | ICD-10-CM

## 2018-05-03 ENCOUNTER — Telehealth: Payer: Self-pay | Admitting: Internal Medicine

## 2018-05-03 NOTE — Telephone Encounter (Signed)
Copied from [place] [id number]. Topic: Referral - Status >> May 03, 2018  4:33 PM Burchel, Abbi R wrote: Reason for CRM:   Pt calling to get updated statud of her Referral to [facility] (Dr. Susa Day)   Fx: [phone]  Pt requesting a call when referral is completed.    Pt: [phone]

## 2018-05-04 ENCOUNTER — Telehealth: Payer: Self-pay | Admitting: Emergency Medicine

## 2018-05-04 NOTE — Telephone Encounter (Signed)
Spoke with pt to inform that referral was faxed this AM and will be contacted by [facility] ortho.

## 2018-05-04 NOTE — Telephone Encounter (Signed)
Copied from [place] [id number]. Topic: Referral - Status >> May 03, 2018  2:43 PM Vernona Rieger wrote: Reason for CRM: Patient said that Lovena Le was suppose to put in a referral for her to go to [facility] this morning. Patient said she is following up on this. Call back [phone]

## 2018-05-15 DIAGNOSIS — M5416 Radiculopathy, lumbar region: Secondary | ICD-10-CM | POA: Insufficient documentation

## 2018-05-23 ENCOUNTER — Emergency Department (HOSPITAL_COMMUNITY): Payer: Medicare Other

## 2018-05-23 ENCOUNTER — Other Ambulatory Visit: Payer: Self-pay

## 2018-05-23 ENCOUNTER — Emergency Department (HOSPITAL_COMMUNITY)
Admission: EM | Admit: 2018-05-23 | Discharge: 2018-05-23 | Disposition: A | Discharge status: Home / Self Care | Payer: Medicare Other | Attending: Emergency Medicine | Admitting: Emergency Medicine

## 2018-05-23 DIAGNOSIS — Z79899 Other long term (current) drug therapy: Secondary | ICD-10-CM | POA: Diagnosis not present

## 2018-05-23 DIAGNOSIS — R0602 Shortness of breath: Secondary | ICD-10-CM | POA: Diagnosis present

## 2018-05-23 DIAGNOSIS — K209 Esophagitis, unspecified without bleeding: Secondary | ICD-10-CM

## 2018-05-23 DIAGNOSIS — Z87891 Personal history of nicotine dependence: Secondary | ICD-10-CM | POA: Insufficient documentation

## 2018-05-23 DIAGNOSIS — J40 Bronchitis, not specified as acute or chronic: Secondary | ICD-10-CM | POA: Diagnosis not present

## 2018-05-23 LAB — CBC WITH DIFFERENTIAL/PLATELET
Abs Immature Granulocytes: 0.1 10*3/uL (ref 0.0–0.1)
Basophils Absolute: 0.1 10*3/uL (ref 0.0–0.1)
Basophils Relative: 1 %
EOS ABS: 0.1 10*3/uL (ref 0.0–0.7)
EOS PCT: 1 %
HEMATOCRIT: 41.2 % (ref 36.0–46.0)
Hemoglobin: 13.8 g/dL (ref 12.0–15.0)
IMMATURE GRANULOCYTES: 1 %
LYMPHS ABS: 2.9 10*3/uL (ref 0.7–4.0)
Lymphocytes Relative: 28 %
MCH: 32.2 pg (ref 26.0–34.0)
MCHC: 33.5 g/dL (ref 30.0–36.0)
MCV: 96 fL (ref 78.0–100.0)
MONOS PCT: 11 %
Monocytes Absolute: 1.1 10*3/uL — ABNORMAL HIGH (ref 0.1–1.0)
Neutro Abs: 6.1 10*3/uL (ref 1.7–7.7)
Neutrophils Relative %: 58 %
Platelets: 315 10*3/uL (ref 150–400)
RBC: 4.29 MIL/uL (ref 3.87–5.11)
RDW: 12.9 % (ref 11.5–15.5)
WBC: 10.2 10*3/uL (ref 4.0–10.5)

## 2018-05-23 LAB — COMPREHENSIVE METABOLIC PANEL
ALK PHOS: 38 U/L (ref 38–126)
ALT: 16 U/L (ref 0–44)
AST: 21 U/L (ref 15–41)
Albumin: 3.5 g/dL (ref 3.5–5.0)
Anion gap: 11 (ref 5–15)
BILIRUBIN TOTAL: 0.7 mg/dL (ref 0.3–1.2)
BUN: 16 mg/dL (ref 8–23)
CO2: 24 mmol/L (ref 22–32)
Calcium: 9.5 mg/dL (ref 8.9–10.3)
Chloride: 104 mmol/L (ref 98–111)
Creatinine, Ser: 0.76 mg/dL (ref 0.44–1.00)
GFR calc Af Amer: >60 mL/min (ref >60)
Glucose, Bld: 72 mg/dL (ref 70–99)
POTASSIUM: 3.4 mmol/L — AB (ref 3.5–5.1)
Sodium: 139 mmol/L (ref 135–145)
TOTAL PROTEIN: 6.6 g/dL (ref 6.5–8.1)

## 2018-05-23 LAB — I-STAT TROPONIN, ED: TROPONIN I, POC: 0 ng/mL (ref 0.00–0.08)

## 2018-05-23 LAB — CK: Total CK: 75 U/L (ref 38–234)

## 2018-05-23 MED ORDER — PANTOPRAZOLE SODIUM 40 MG PO TBEC
40.0000 mg | DELAYED_RELEASE_TABLET | Freq: Once | ORAL | Status: AC
  Administered 2018-05-23: 40 mg via ORAL
  Filled 2018-05-23: qty 1

## 2018-05-23 MED ORDER — TRAMADOL HCL 50 MG PO TABS
50.0000 mg | ORAL_TABLET | Freq: Once | ORAL | Status: AC
  Administered 2018-05-23: 50 mg via ORAL
  Filled 2018-05-23: qty 1

## 2018-05-23 MED ORDER — MORPHINE SULFATE (PF) 4 MG/ML IV SOLN: 4.0000 mg | Freq: Once | INTRAVENOUS | Status: DC

## 2018-05-23 MED ORDER — IOPAMIDOL (ISOVUE-370) INJECTION 76%
INTRAVENOUS | Status: AC
  Filled 2018-05-23: qty 100

## 2018-05-23 MED ORDER — ALBUTEROL SULFATE HFA 108 (90 BASE) MCG/ACT IN AERS: 1.0000 | INHALATION_SPRAY | Freq: Four times a day (QID) | RESPIRATORY_TRACT | 0 refills | Status: DC | PRN

## 2018-05-23 MED ORDER — PANTOPRAZOLE SODIUM 20 MG PO TBEC: 20.0000 mg | DELAYED_RELEASE_TABLET | Freq: Every day | ORAL | 0 refills | Status: DC

## 2018-05-23 MED ORDER — SODIUM CHLORIDE 0.9 % IV BOLUS
1000.0000 mL | Freq: Once | INTRAVENOUS | Status: AC
  Administered 2018-05-23: 1000 mL via INTRAVENOUS

## 2018-05-23 MED ORDER — ONDANSETRON HCL 4 MG/2ML IJ SOLN: 4.0000 mg | Freq: Once | INTRAMUSCULAR | Status: DC

## 2018-05-23 MED ORDER — IOPAMIDOL (ISOVUE-370) INJECTION 76%
100.0000 mL | Freq: Once | INTRAVENOUS | Status: AC | PRN
  Administered 2018-05-23: 100 mL via INTRAVENOUS

## 2018-05-23 NOTE — Progress Notes (Signed)
   05/23/18 1737  Clinical Encounter Type  Visited With Health care provider  Visit Type Initial;ED  Referral From Nurse  Consult/Referral To Chaplain   Responded to a page to the ED for patient requesting assistance with AD.  Patient has gone to CT.  Will follow and support as needed. Chaplain Katherene Ponto

## 2018-05-23 NOTE — ED Provider Notes (Signed)
[facility] EMERGENCY DEPARTMENT Provider Note   CSN: [id number] Arrival date & time:        History   Chief Complaint Chief Complaint  Patient presents with  . Shortness of Breath    HPI Marissa Lee is a 67 y.o. female history of endometriosis, hypercholesterol, known back pain with known sciatica here presenting with shortness of breath.  Patient states that she just had spinal injections several weeks ago.  She states that her sciatica symptoms site been progressively getting worse so she has not been walking very much.  She states that she is taking gabapentin for pain but denies any numbness or weakness to the legs just tingling down both legs.  Denies any incontinence.  She still participates in physical therapy but has not been as active as usual.  She went to her primary care doctor was complaining some shortness of breath for the last 2 to 3 days.  She was noted to be tachypneic and PCP sent her here for rule out PE. Denies recent travel or leg swelling or calf pain or hx of DVT/PE.   The history is provided by the patient.    Past Medical History:  Diagnosis Date  . Anxiety   . Endometriosis   . Fibrocystic breast    muliple drainage  . Fibroid   . History of bronchitis   . Hypercholesteremia 2007   currently not taking anything for it  . Osteopenia 08/2016   hip and spine  . Urge incontinence 06/05/2013    Patient Active Problem List   Diagnosis Date Noted  . Low back pain radiating to left lower extremity 04/26/2018  . Anxiety 09/29/2017  . Osteopenia 09/28/2016  . Hyperlipidemia 09/28/2016  . 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 Vivid dream 07/20/2015  . Constipation 09/06/2013  . Post-menopause on HRT (hormone replacement therapy) 06/05/2013    Past Surgical History:  Procedure Laterality Date  . ABDOMINAL HYSTERECTOMY    . BLADDER SUSPENSION  2004   done with TVH  . BREAST  BIOPSY  1980   done 3x  . CLOSED REDUCTION METACARPAL WITH PERCUTANEOUS PINNING Right 08/24/2016   Procedure: CLOSED REDUCTION  WITH PERCUTANEOUS PINNING;  Surgeon: Iran Planas, MD;  Location: [place] [facility];  Service: Orthopedics;  Laterality: Right;  . COLONOSCOPY    . COMBINED HYSTERECTOMY VAGINAL / OOPHORECTOMY / A&P REPAIR  2004  . FOOT SURGERY Right 1992  . OPEN REDUCTION INTERNAL FIXATION (ORIF) HAND Right 08/24/2016   Procedure: RIGHT HAND LONG RING AND SMALL FINGER  POSSIBLE ORIF;  Surgeon: Iran Planas, MD;  Location: [place];  Service: Orthopedics;  Laterality: Right;  . WISDOM TOOTH EXTRACTION  1978     OB History    Gravida  5   Para  2   Term  2   Preterm      AB  3   Living  2     SAB  3   TAB      Ectopic      Multiple      Live Births  2            Home Medications    Prior to Admission medications   Medication Sig Start Date End Date Taking? Authorizing Provider  Calcium-Phosphorus-Vitamin D (CITRACAL +D3 PO) Take by mouth daily.    [provider]  cyclobenzaprine (FLEXERIL) 5 MG tablet Take 1 tablet (5 mg total) by mouth 3 (three)  times daily as needed for muscle spasms. 04/26/18   Binnie Rail, MD  Dextrose, Diabetic Use, (GLUCOSE PO) Take 1 tablet by mouth daily as needed. When feels like sugar is low    [provider]  diazepam (VALIUM) 5 MG tablet Take one tab 1 hour prior to MRI, may repeat x 1 04/27/18   Burns, Claudina Lick, MD  estradiol (MINIVELLE) 0.0375 MG/24HR Place 1 patch onto the skin 2 (two) times a week. 11/16/17   Nunzio Cobbs, MD  ezetimibe (ZETIA) 10 MG tablet Take 1 tablet (10 mg total) by mouth daily. 02/22/18   Binnie Rail, MD  FLUoxetine (PROZAC) 40 MG capsule TAKE ONE CAPSULE BY MOUTH EVERY DAY 07/10/17   Nunzio Cobbs, MD  gabapentin (NEURONTIN) 100 MG capsule Take 1 capsule (100 mg total) by mouth at bedtime. Increase to 2-3 caps at night if tolerated 04/26/18   Binnie Rail, MD  naproxen  (NAPROSYN) 375 MG tablet Take 1 tablet (375 mg total) by mouth 2 (two) times daily. May start once Medrol Pack Completed 04/16/18   Augusto Gamble B, NP  oxyCODONE-acetaminophen (PERCOCET/ROXICET) 5-325 MG tablet Take 1-2 tablets by mouth every 4 (four) hours as needed for severe pain. 04/26/18   Binnie Rail, MD    Family History Family History  Problem Relation Age of Onset  . Cancer Paternal Grandmother        Colon   . Cancer Paternal Grandfather        Lung  . Heart disease Mother        valvular disease, valve replacement  . Atrial fibrillation Mother   . Stroke Mother   . Heart disease Brother        valve disease, valve surgery  . Thyroid disease Maternal Grandmother        Goiter  . Thyroid disease Daughter        Hypothyroid    Social History Social History   Tobacco Use  . Smoking status: Former Smoker    Packs/day: 1.00    Years: 20.00    Pack years: 20.00    Types: Cigarettes  . Smokeless tobacco: Never Used  . Tobacco comment: quit in approximately 2000  Substance Use Topics  . Alcohol use: Yes    Alcohol/week: 3.0 - 4.0 standard drinks    Types: 2 Cans of beer, 1 - 2 Standard drinks or equivalent per week    Comment: rarely  . Drug use: No     Allergies   Cephalosporins and Codeine   Review of Systems Review of Systems  Respiratory: Positive for shortness of breath.   All other systems reviewed and are negative.    Physical Exam Updated Vital Signs Ht 5\' 4"  (1.626 m)   Wt 63.5 kg   LMP 09/27/2003   SpO2 98%   BMI 24.03 kg/m   Physical Exam  Constitutional: She is oriented to person, place, and time. She appears well-developed.  Slightly tachypneic   HENT:  Head: Normocephalic.  Mouth/Throat: Oropharynx is clear and moist.  Eyes: Pupils are equal, round, and reactive to light. EOM are normal.  Neck: Normal range of motion.  Cardiovascular: Normal rate and regular rhythm.  Pulmonary/Chest: Effort normal and breath sounds normal.    Abdominal: Soft. Bowel sounds are normal.  Musculoskeletal: Normal range of motion.       Right lower leg: Normal.       Left lower leg: Normal.  Positive  straight leg raise bilaterally, no saddle anesthesia, nl sensation bilaterally   Neurological: She is alert and oriented to person, place, and time.  Skin: Skin is warm and dry. Capillary refill takes less than 2 seconds.  Nl sensation. Nl neurovascular exam bilateral legs   Psychiatric: She has a normal mood and affect.  Nursing note and vitals reviewed.    ED Treatments / Results  Labs (all labs ordered are listed, but only abnormal results are displayed) Labs Reviewed  CBC WITH DIFFERENTIAL/PLATELET - Abnormal; Notable for the following components:      Result Value   Monocytes Absolute 1.1 (*)    All other components within normal limits  COMPREHENSIVE METABOLIC PANEL  CK  I-STAT TROPONIN, ED    EKG EKG Interpretation  Date/Time:  Wednesday May 23 2018 16:01:36 EDT Ventricular Rate:  77 PR Interval:    QRS Duration: 86 QT Interval:  388 QTC Calculation: 440 R Axis:   -35 Text Interpretation:  Sinus rhythm Left axis deviation Abnormal R-wave progression, early transition No significant change since last tracing Confirmed by Wandra Arthurs [phone]) on 05/23/2018 4:49:08 PM   Radiology No results found.  Procedures Procedures (including critical care time)  Medications Ordered in ED Medications  sodium chloride 0.9 % bolus 1,000 mL (1,000 mLs Intravenous New Bag/Given 05/23/18 1610)  traMADol (ULTRAM) tablet 50 mg (50 mg Oral Given 05/23/18 1608)     Initial Impression / Assessment and Plan / ED Course  I have reviewed the triage vital signs and the nursing notes.  Pertinent labs & imaging results that were available during my care of the patient were reviewed by me and considered in my medical decision making (see chart for details).    Lowen Mansouri is a 67 y.o. female here with SOB. Consider PE vs  bronchitis, I doubt ACS. Will get CTA chest, labs. Symptoms for several days so trop x 1 sufficient.   7:51 PM CTA showed bronchial wall thickening. Patient has no wheezing. Also has possible thickened esophagus, possible esophagitis. Given protonix. Will give albuterol prn bronchitis. Will refer to GI.    Final Clinical Impressions(s) / ED Diagnoses   Final diagnoses:  None    ED Discharge Orders    None       Drenda Freeze, MD 05/23/18 [phone]

## 2018-05-23 NOTE — ED Notes (Signed)
ED Provider at bedside. 

## 2018-05-23 NOTE — ED Triage Notes (Signed)
Pt to ED via EMS from [place] where she is being treated for sciatic nerve pain and has been essentially bedridden for the last few weeks. Pt was sob on arrival to dr's office, not relieved by any measures taken by staff so was sent here for evaluation of possible PE. Pt in NAD, 98% on room air.

## 2018-05-23 NOTE — ED Notes (Signed)
Pt ambulated to bathroom 

## 2018-05-23 NOTE — Discharge Instructions (Signed)
Your CT showed some esophagitis. Also possible bronchitis.   Take protonix daily. See Dr. Collene Mares for endoscopy if you have persistent symptoms.   Use albuterol as needed for cough or wheezing.   See your doctor for follow up   Return to ER if you have worse shortness of breath, cough, wheezing, fever.

## 2018-06-04 ENCOUNTER — Encounter: Payer: Self-pay | Admitting: Internal Medicine

## 2018-07-16 ENCOUNTER — Encounter: Payer: Self-pay | Admitting: Obstetrics and Gynecology

## 2018-07-16 ENCOUNTER — Ambulatory Visit (INDEPENDENT_AMBULATORY_CARE_PROVIDER_SITE_OTHER): Payer: BLUE CROSS/BLUE SHIELD | Admitting: Obstetrics and Gynecology

## 2018-07-16 ENCOUNTER — Other Ambulatory Visit: Payer: Self-pay

## 2018-07-16 VITALS — BP 132/70 | HR 86 | Resp 14 | Ht 63.0 in | Wt 141.4 lb

## 2018-07-16 DIAGNOSIS — Z780 Asymptomatic menopausal state: Secondary | ICD-10-CM

## 2018-07-16 DIAGNOSIS — Z01419 Encounter for gynecological examination (general) (routine) without abnormal findings: Secondary | ICD-10-CM

## 2018-07-16 DIAGNOSIS — R350 Frequency of micturition: Secondary | ICD-10-CM

## 2018-07-16 LAB — POCT URINALYSIS DIPSTICK
Bilirubin, UA: NEGATIVE
Glucose, UA: NEGATIVE
Ketones, UA: NEGATIVE
Leukocytes, UA: NEGATIVE
Nitrite, UA: NEGATIVE
PH UA: 5 (ref 5.0–8.0)
Protein, UA: POSITIVE — AB
RBC UA: NEGATIVE
UROBILINOGEN UA: 0.2 U/dL

## 2018-07-16 MED ORDER — FLUOXETINE HCL 40 MG PO CAPS: ORAL_CAPSULE | ORAL | 3 refills | Status: DC

## 2018-07-16 NOTE — Patient Instructions (Signed)

## 2018-07-16 NOTE — Progress Notes (Signed)
67 y.o. [id number] [name] here for annual exam.    Hurt her back this summer.  Doing PT. Stopped her ERT and her Zetia since July.  Tried black cohosh 3 weeks ago.   She did try Gabapentin for her pain, and she eventually stopped this in September.  Having a difficult time sleeping.  Having dizziness and blurred vision.  Had her vision checked, and she was prescribed glasses.  Dealing with constipation.   Some urinary frequency and urgency.   On Prozac 40 mg daily for a long time for anxiety.   Urine dip - trace protein.   PCP:   Billey Gosling, MD  Patient's last menstrual period was 09/27/2003.           Sexually active: No.  The current method of family planning is status post hysterectomy.    Exercising: Yes.    water therapy once a week, walking Smoker:  No- former smoker  Health Maintenance: Pap:  Years ago History of abnormal Pap:  no MMG:  09-22-17 Neg/density D/BiRads2 Colonoscopy:  10-06-14 polyp with Dr. Collene Mares. Next due 09/2019 BMD:  09-09-16 Result:  Osteopenia TDaP:  06-30-16 Gardasil:   no HIV:06-30-16 NR Hep C:06-30-16 Neg Screening Labs:  Hb today: discuss with provider, Urine today: trace protein   reports that she has quit smoking. Her smoking use included cigarettes. She has a 20.00 pack-year smoking history. She has never used smokeless tobacco. She reports that she drinks about 3.0 - 4.0 standard drinks of alcohol per week. She reports that she does not use drugs.  Past Medical History:  Diagnosis Date  . Anxiety   . Endometriosis   . Fibrocystic breast    muliple drainage  . Fibroid   . Hiatal hernia   . History of bronchitis   . Hypercholesteremia 2007   currently not taking anything for it  . Osteopenia 08/2016   hip and spine  . Urge incontinence 06/05/2013    Past Surgical History:  Procedure Laterality Date  . ABDOMINAL HYSTERECTOMY    . BLADDER SUSPENSION  2004   done with TVH  . BREAST BIOPSY  1980   done 3x  . CLOSED  REDUCTION METACARPAL WITH PERCUTANEOUS PINNING Right 08/24/2016   Procedure: CLOSED REDUCTION  WITH PERCUTANEOUS PINNING;  Surgeon: Iran Planas, MD;  Location: [facility];  Service: Orthopedics;  Laterality: Right;  . COLONOSCOPY    . COMBINED HYSTERECTOMY VAGINAL / OOPHORECTOMY / A&P REPAIR  2004  . FOOT SURGERY Right 1992  . OPEN REDUCTION INTERNAL FIXATION (ORIF) HAND Right 08/24/2016   Procedure: RIGHT HAND LONG RING AND SMALL FINGER  POSSIBLE ORIF;  Surgeon: Iran Planas, MD;  Location: [facility];  Service: Orthopedics;  Laterality: Right;  . WISDOM TOOTH EXTRACTION  1978    Current Outpatient Medications  Medication Sig Dispense Refill  . BLACK COHOSH PO Take by mouth at bedtime.    . Calcium-Phosphorus-Vitamin D (CITRACAL +D3 PO) Take by mouth daily.    Marland Kitchen Dextrose, Diabetic Use, (GLUCOSE PO) Take 1 tablet by mouth daily as needed. When feels like sugar is low    . FLUoxetine (PROZAC) 40 MG capsule TAKE ONE CAPSULE BY MOUTH EVERY DAY 90 capsule 3  . pantoprazole (PROTONIX) 20 MG tablet Take 1 tablet (20 mg total) by mouth daily. 30 tablet 0  . ezetimibe (ZETIA) 10 MG tablet Take 1 tablet (10 mg total) by mouth daily. (Patient not taking: Reported on 07/16/2018) 90 tablet 2   No current facility-administered  medications for this visit.     Family History  Problem Relation Age of Onset  . Cancer Paternal Grandmother        Colon   . Cancer Paternal Grandfather        Lung  . Heart disease Mother        valvular disease, valve replacement  . Atrial fibrillation Mother   . Stroke Mother   . Heart disease Brother        valve disease, valve surgery  . Thyroid disease Maternal Grandmother        Goiter  . Thyroid disease Daughter        Hypothyroid    Review of Systems  Constitutional: Negative.   HENT: Negative.   Eyes: Negative.   Respiratory: Negative.   Cardiovascular: Negative.   Gastrointestinal: Positive for constipation.  Endocrine: Negative.   Genitourinary: Positive  for frequency and urgency.  Musculoskeletal: Negative.   Skin:       New or changed mole/lump  Allergic/Immunologic: Negative.   Neurological: Negative.   Hematological: Negative.   Psychiatric/Behavioral: Negative.     Exam:   BP 132/70 (BP Location: Right Arm, Patient Position: Sitting, Cuff Size: Normal)   Pulse 86   Resp 14   Ht 5\' 3"  (1.6 m)   Wt 141 lb 6.4 oz (64.1 kg)   LMP 09/27/2003   BMI 25.05 kg/m     General appearance: alert, cooperative and appears stated age Head: Normocephalic, without obvious abnormality, atraumatic Neck: no adenopathy, supple, symmetrical, trachea midline and thyroid normal to inspection and palpation Lungs: clear to auscultation bilaterally Breasts: normal appearance, no masses or tenderness, No nipple retraction or dimpling, No nipple discharge or bleeding, No axillary or supraclavicular adenopathy Heart: regular rate and rhythm Abdomen: soft, non-tender; no masses, no organomegaly Extremities: extremities normal, atraumatic, no cyanosis or edema Skin: Skin color, texture, turgor normal. No rashes or lesions Lymph nodes: Cervical, supraclavicular, and axillary nodes normal. No abnormal inguinal nodes palpated Neurologic: Grossly normal  Pelvic: External genitalia:  no lesions              Urethra:  normal appearing urethra with no masses, tenderness or lesions              Bartholins and Skenes: normal                 Vagina: normal appearing vagina with normal color and discharge, no lesions              Cervix:  absent              Pap taken: No. Bimanual Exam:  Uterus:  absent              Adnexa: no mass, fullness, tenderness              Rectal exam: Yes.  .  Confirms.              Anus:  normal sphincter tone, no lesions  Chaperone was present for exam.  Assessment:   Well woman visit with normal exam. Status post TVH/oophorectomy/anterior and posterior colporrhaphy. ERT patient.  Hyperlipidemia.  Anxiety.  Osteopenia.   Sleep disturbance.  Likely from change in her routine and stopping ERT.   Plan: Mammogram screening. Recommended self breast awareness. Pap and HR HPV as above. Guidelines for Calcium, Vitamin D, regular exercise program including cardiovascular and weight bearing exercise. Try calcium with magnesium added.  BMD this year.  Refill of  Prozac for one year.  She will restart her Zetia.  I discussed Melatonin.  She will follow up w/ dermatology.  Follow up annually and prn.   After visit summary provided.

## 2018-07-26 ENCOUNTER — Other Ambulatory Visit: Payer: Self-pay | Admitting: Obstetrics and Gynecology

## 2018-07-26 DIAGNOSIS — Z780 Asymptomatic menopausal state: Secondary | ICD-10-CM

## 2018-07-26 DIAGNOSIS — M8580 Other specified disorders of bone density and structure, unspecified site: Secondary | ICD-10-CM

## 2018-07-31 ENCOUNTER — Other Ambulatory Visit: Payer: Self-pay | Admitting: Obstetrics and Gynecology

## 2018-07-31 DIAGNOSIS — Z1231 Encounter for screening mammogram for malignant neoplasm of breast: Secondary | ICD-10-CM

## 2018-08-10 ENCOUNTER — Ambulatory Visit: Payer: Self-pay | Admitting: Family Medicine

## 2018-08-10 VITALS — BP 124/78 | HR 67 | Temp 98.0°F | Resp 17 | Wt 147.0 lb

## 2018-08-10 DIAGNOSIS — J329 Chronic sinusitis, unspecified: Secondary | ICD-10-CM

## 2018-08-10 MED ORDER — DOXYCYCLINE HYCLATE 100 MG PO TABS: 100.0000 mg | ORAL_TABLET | Freq: Two times a day (BID) | ORAL | 0 refills | Status: DC

## 2018-08-10 MED ORDER — PSEUDOEPH-BROMPHEN-DM 30-2-10 MG/5ML PO SYRP: 10.0000 mL | ORAL_SOLUTION | Freq: Three times a day (TID) | ORAL | 0 refills | Status: DC | PRN

## 2018-08-10 MED ORDER — AZELASTINE HCL 0.1 % NA SOLN: 1.0000 | Freq: Two times a day (BID) | NASAL | 0 refills | Status: DC

## 2018-08-10 NOTE — Patient Instructions (Addendum)
Sinusitis, Adult  Albuterol  How to Use a Metered Dose Inhaler A metered dose inhaler is a handheld device for taking medicine that must be breathed into the lungs (inhaled). The device can be used to deliver a variety of inhaled medicines, including:  Quick relief or rescue medicines, such as bronchodilators.  Controller medicines, such as corticosteroids.  The medicine is delivered by pushing down on a metal canister to release a preset amount of spray and medicine. Each device contains the amount of medicine that is needed for a preset number of uses (inhalations). Your health care provider may recommend that you use a spacer with your inhaler to help you take the medicine more effectively. A spacer is a plastic tube with a mouthpiece on one end and an opening that connects to the inhaler on the other end. A spacer holds the medicine in a tube for a short time, which allows you to inhale more medicine. What are the risks? If you do not use your inhaler correctly, medicine might not reach your lungs to help you breathe. Inhaler medicine can cause side effects, such as:  Mouth or throat infection.  Cough.  Hoarseness.  Headache.  Nausea and vomiting.  Lung infection (pneumonia) in people who have a lung condition called COPD.  How to use a metered dose inhaler without a spacer 1. Remove the cap from the inhaler. 2. If you are using the inhaler for the first time, shake it for 5 seconds, turn it away from your face, then release 4 puffs into the air. This is called priming. 3. Shake the inhaler for 5 seconds. 4. Position the inhaler so the top of the canister faces up. 5. Put your index finger on the top of the medicine canister. Support the bottom of the inhaler with your thumb. 6. Breathe out normally and as completely as possible, away from the inhaler. 7. Either place the inhaler between your teeth and close your lips tightly around the mouthpiece, or hold the inhaler 1-2  inches (2.5-5 cm) away from your open mouth. Keep your tongue down out of the way. If you are unsure which technique to use, ask your health care provider. 8. Press the canister down with your index finger to release the medicine, then inhale deeply and slowly through your mouth (not your nose) until your lungs are completely filled. Inhaling should take 4-6 seconds. 9. Hold the medicine in your lungs for 5-10 seconds (10 seconds is best). This helps the medicine get into the small airways of your lungs. 10. With your lips in a tight circle (pursed), breathe out slowly. 11. Repeat steps 3-10 until you have taken the number of puffs that your health care provider directed. Wait about 1 minute between puffs or as directed. 12. Put the cap on the inhaler. 13. If you are using a steroid inhaler, rinse your mouth with water, gargle, and spit out the water. Do not swallow the water. How to use a metered dose inhaler with a spacer 1. Remove the cap from the inhaler. 2. If you are using the inhaler for the first time, shake it for 5 seconds, turn it away from your face, then release 4 puffs into the air. This is called priming. 3. Shake the inhaler for 5 seconds. 4. Place the open end of the spacer onto the inhaler mouthpiece. 5. Position the inhaler so the top of the canister faces up and the spacer mouthpiece faces you. 6. Put your index finger on the  top of the medicine canister. Support the bottom of the inhaler and the spacer with your thumb. 7. Breathe out normally and as completely as possible, away from the spacer. 8. Place the spacer between your teeth and close your lips tightly around it. Keep your tongue down out of the way. 9. Press the canister down with your index finger to release the medicine, then inhale deeply and slowly through your mouth (not your nose) until your lungs are completely filled. Inhaling should take 4-6 seconds. 10. Hold the medicine in your lungs for 5-10 seconds (10  seconds is best). This helps the medicine get into the small airways of your lungs. 11. With your lips in a tight circle (pursed), breathe out slowly. 12. Repeat steps 3-11 until you have taken the number of puffs that your health care provider directed. Wait about 1 minute between puffs or as directed. 13. Remove the spacer from the inhaler and put the cap on the inhaler. 14. If you are using a steroid inhaler, rinse your mouth with water, gargle, and spit out the water. Do not swallow the water. Follow these instructions at home:  Take your inhaled medicine only as told by your health care provider. Do not use the inhaler more than directed by your health care provider.  Keep all follow-up visits as told by your health care provider. This is important.  If your inhaler has a counter, you can check it to determine how full your inhaler is. If your inhaler does not have a counter, ask your health care provider when you will need to refill your inhaler and write the refill date on a calendar or on your inhaler canister. Note that you cannot know when an inhaler is empty by shaking it.  Follow directions on the package insert for care and cleaning of your inhaler and spacer. Contact a health care provider if:  Symptoms are only partially relieved with your inhaler.  You are having trouble using your inhaler.  You have an increase in phlegm.  You have headaches. Get help right away if:  You feel little or no relief after using your inhaler.  You have dizziness.  You have a fast heart rate.  You have chills or a fever.  You have night sweats.  There is blood in your phlegm. Summary  A metered dose inhaler is a handheld device for taking medicine that must be breathed into the lungs (inhaled).  The medicine is delivered by pushing down on a metal canister to release a preset amount of spray and medicine.  Each device contains the amount of medicine that is needed for a preset  number of uses (inhalations). This information is not intended to replace advice given to you by your health care provider. Make sure you discuss any questions you have with your health care provider. Document Released: 09/12/2005 Document Revised: 08/02/2016 Document Reviewed: 08/02/2016 Elsevier Interactive Patient Education  2017 [employer].  Sinusitis is soreness and inflammation of your sinuses. Sinuses are hollow spaces in the bones around your face. Your sinuses are located:  Around your eyes.  In the middle of your forehead.  Behind your nose.  In your cheekbones.  Your sinuses and nasal passages are lined with a stringy fluid (mucus). Mucus normally drains out of your sinuses. When your nasal tissues become inflamed or swollen, the mucus can become trapped or blocked so air cannot flow through your sinuses. This allows bacteria, viruses, and funguses to grow, which leads to  infection. Sinusitis can develop quickly and last for 7?10 days (acute) or for more than 12 weeks (chronic). Sinusitis often develops after a cold. What are the causes? This condition is caused by anything that creates swelling in the sinuses or stops mucus from draining, including:  Allergies.  Asthma.  Bacterial or viral infection.  Abnormally shaped bones between the nasal passages.  Nasal growths that contain mucus (nasal polyps).  Narrow sinus openings.  Pollutants, such as chemicals or irritants in the air.  A foreign object stuck in the nose.  A fungal infection. This is rare.  What increases the risk? The following factors may make you more likely to develop this condition:  Having allergies or asthma.  Having had a recent cold or respiratory tract infection.  Having structural deformities or blockages in your nose or sinuses.  Having a weak immune system.  Doing a lot of swimming or diving.  Overusing nasal sprays.  Smoking.  What are the signs or symptoms? The main  symptoms of this condition are pain and a feeling of pressure around the affected sinuses. Other symptoms include:  Upper toothache.  Earache.  Headache.  Bad breath.  Decreased sense of smell and taste.  A cough that may get worse at night.  Fatigue.  Fever.  Thick drainage from your nose. The drainage is often green and it may contain pus (purulent).  Stuffy nose or congestion.  Postnasal drip. This is when extra mucus collects in the throat or back of the nose.  Swelling and warmth over the affected sinuses.  Sore throat.  Sensitivity to light.  How is this diagnosed? This condition is diagnosed based on symptoms, a medical history, and a physical exam. To find out if your condition is acute or chronic, your health care provider may:  Look in your nose for signs of nasal polyps.  Tap over the affected sinus to check for signs of infection.  View the inside of your sinuses using an imaging device that has a light attached (endoscope).  If your health care provider suspects that you have chronic sinusitis, you may also:  Be tested for allergies.  Have a sample of mucus taken from your nose (nasal culture) and checked for bacteria.  Have a mucus sample examined to see if your sinusitis is related to an allergy.  If your sinusitis does not respond to treatment and it lasts longer than 8 weeks, you may have an MRI or CT scan to check your sinuses. These scans also help to determine how severe your infection is. In rare cases, a bone biopsy may be done to rule out more serious types of fungal sinus disease. How is this treated? Treatment for sinusitis depends on the cause and whether your condition is chronic or acute. If a virus is causing your sinusitis, your symptoms will go away on their own within 10 days. You may be given medicines to relieve your symptoms, including:  Topical nasal decongestants. They shrink swollen nasal passages and let mucus drain from your  sinuses.  Antihistamines. These drugs block inflammation that is triggered by allergies. This can help to ease swelling in your nose and sinuses.  Topical nasal corticosteroids. These are nasal sprays that ease inflammation and swelling in your nose and sinuses.  Nasal saline washes. These rinses can help to get rid of thick mucus in your nose.  If your condition is caused by bacteria, you will be given an antibiotic medicine. If your condition is caused by  a fungus, you will be given an antifungal medicine. Surgery may be needed to correct underlying conditions, such as narrow nasal passages. Surgery may also be needed to remove polyps. Follow these instructions at home: Medicines  Take, use, or apply over-the-counter and prescription medicines only as told by your health care provider. These may include nasal sprays.  If you were prescribed an antibiotic medicine, take it as told by your health care provider. Do not stop taking the antibiotic even if you start to feel better. Hydrate and Humidify  Drink enough water to keep your urine clear or pale yellow. Staying hydrated will help to thin your mucus.  Use a cool mist humidifier to keep the humidity level in your home above 50%.  Inhale steam for 10-15 minutes, 3-4 times a day or as told by your health care provider. You can do this in the bathroom while a hot shower is running.  Limit your exposure to cool or dry air. Rest  Rest as much as possible.  Sleep with your head raised (elevated).  Make sure to get enough sleep each night. General instructions  Apply a warm, moist washcloth to your face 3-4 times a day or as told by your health care provider. This will help with discomfort.  Wash your hands often with soap and water to reduce your exposure to viruses and other germs. If soap and water are not available, use hand sanitizer.  Do not smoke. Avoid being around people who are smoking (secondhand smoke).  Keep all  follow-up visits as told by your health care provider. This is important. Contact a health care provider if:  You have a fever.  Your symptoms get worse.  Your symptoms do not improve within 10 days. Get help right away if:  You have a severe headache.  You have persistent vomiting.  You have pain or swelling around your face or eyes.  You have vision problems.  You develop confusion.  Your neck is stiff.  You have trouble breathing. This information is not intended to replace advice given to you by your health care provider. Make sure you discuss any questions you have with your health care provider. Document Released: 09/12/2005 Document Revised: 05/08/2016 Document Reviewed: 07/08/2015 Elsevier Interactive Patient Education  [employer].

## 2018-08-10 NOTE — Progress Notes (Signed)
Marissa Lee is a 67 y.o. female who presents today with concerns of 2 weeks for feeling tired, cough and congestion. She has attempted over the counter medications without real relief of symptoms.  Review of Systems  Constitutional: Positive for malaise/fatigue. Negative for chills and fever.  HENT: Positive for congestion and sinus pain. Negative for ear discharge, ear pain and sore throat.   Eyes: Negative.   Respiratory: Positive for cough. Negative for sputum production and shortness of breath.   Cardiovascular: Negative.  Negative for chest pain.  Gastrointestinal: Negative for abdominal pain, diarrhea, nausea and vomiting.  Genitourinary: Negative for dysuria, frequency, hematuria and urgency.  Musculoskeletal: Negative for myalgias.  Skin: Negative.   Neurological: Negative for headaches.  Endo/Heme/Allergies: Negative.   Psychiatric/Behavioral: Negative.     O: Vitals:   08/10/18 1527  BP: 124/78  Pulse: 67  Resp: 17  Temp: 98 F (36.7 C)  SpO2: 95%     Physical Exam  Constitutional: She is oriented to person, place, and time. Vital signs are normal. She appears well-developed and well-nourished. She is active.  Non-toxic appearance. She does not have a sickly appearance.  HENT:  Head: Normocephalic.  Right Ear: Hearing, tympanic membrane, external ear and ear canal normal.  Left Ear: Hearing, tympanic membrane, external ear and ear canal normal.  Nose: Rhinorrhea present. Right sinus exhibits frontal sinus tenderness. Right sinus exhibits no maxillary sinus tenderness. Left sinus exhibits frontal sinus tenderness. Left sinus exhibits no maxillary sinus tenderness.  Mouth/Throat: Uvula is midline and oropharynx is clear and moist.  Neck: Normal range of motion. Neck supple.  Cardiovascular: Normal rate, regular rhythm, normal heart sounds and normal pulses.  Pulmonary/Chest: Effort normal and breath sounds normal.  Abdominal: Soft. Bowel sounds are normal.   Musculoskeletal: Normal range of motion.  Lymphadenopathy:       Head (right side): No submental and no submandibular adenopathy present.       Head (left side): No submental and no submandibular adenopathy present.    She has no cervical adenopathy.  Neurological: She is alert and oriented to person, place, and time.  Psychiatric: She has a normal mood and affect.  Vitals reviewed.  A: 1. Sinusitis, unspecified chronicity, unspecified location    P: Discussed exam findings, diagnosis etiology and medication use and indications reviewed with patient. Follow- Up and discharge instructions provided. No emergent/urgent issues found on exam.  Patient verbalized understanding of information provided and agrees with plan of care (POC), all questions answered.  Discussed potential for this to be related to allergies as this is the 3rd exacerbation of like symptoms this year with 2 of the issues happening at peak season for seasonal allergies. Patient is pending a follow up with her PCP in Jan - discussed treating the acute issue today and then discussing the benefits and risks of daily allergy treatment like a Flonase/Zyrtec combination.  1. Sinusitis, unspecified chronicity, unspecified location - brompheniramine-pseudoephedrine-DM 30-2-10 MG/5ML syrup; Take 10 mLs by mouth 3 (three) times daily as needed. - doxycycline (VIBRA-TABS) 100 MG tablet; Take 1 tablet (100 mg total) by mouth 2 (two) times daily. - azelastine (ASTELIN) 0.1 % nasal spray; Place 1 spray into both nostrils 2 (two) times daily. Use in each nostril as directed

## 2018-09-13 ENCOUNTER — Ambulatory Visit
Admission: RE | Admit: 2018-09-13 | Discharge: 2018-09-13 | Disposition: A | Discharge status: Home / Self Care | Payer: Medicare Other | Source: Ambulatory Visit | Attending: Obstetrics and Gynecology | Admitting: Obstetrics and Gynecology

## 2018-09-13 DIAGNOSIS — M8580 Other specified disorders of bone density and structure, unspecified site: Secondary | ICD-10-CM

## 2018-09-13 DIAGNOSIS — Z1231 Encounter for screening mammogram for malignant neoplasm of breast: Secondary | ICD-10-CM

## 2018-09-13 DIAGNOSIS — Z780 Asymptomatic menopausal state: Secondary | ICD-10-CM

## 2018-09-21 ENCOUNTER — Telehealth: Payer: Self-pay | Admitting: *Deleted

## 2018-09-21 NOTE — Telephone Encounter (Signed)
Notes recorded by Burnice Logan, RN on 09/21/2018 at 9:50 AM EST Left message to call Sharee Pimple, RN at [facility].

## 2018-09-21 NOTE — Telephone Encounter (Signed)
-----   Message from Nunzio Cobbs, MD sent at 09/17/2018  5:00 PM EST ----- Please share results of patient's bone density which showed osteopenia of hip and spine. This is early bone thinning, but not osteoporosis.   The risk of fracture is not considered high enough to proceed with pharmacologic treatment.   1200 mg of calcium daily, 800 IU of vitamin D daily, and weight bearing exercise will help to maintain bone density and strength.  Her next bone density is due in 2 years.

## 2018-09-24 NOTE — Telephone Encounter (Signed)
Spoke with patient, advised as seen below per Dr. Silva.  Patient verbalizes understanding and is agreeable.  Encounter closed.  

## 2018-09-25 ENCOUNTER — Other Ambulatory Visit: Payer: Self-pay | Admitting: Obstetrics and Gynecology

## 2018-09-25 NOTE — Telephone Encounter (Signed)
Medication refill request: Fluoxetine  Last AEX: 07-16-18 BS  Next AEX: 07-24-19  Last MMG (if hormonal medication request): 09-13-18 density C/BiRADS 1 negative  Refill authorized: Today, please advise.   [facility] did not receive RX sent on 07-16-18, patient now requesting RX to go to [place] at [facility]. States she has 4 pills left. RN advised would send request to Dr. Quincy Simmonds. Medication pended to [facility] for #[facility], 2RF.

## 2018-09-25 NOTE — Telephone Encounter (Signed)
Patient is asking for a refill of Fluoxetine to [facility] on [address]

## 2018-09-25 NOTE — Telephone Encounter (Signed)
Call to patient. Patient states she went to [facility] to transfer RX and was told [facility] did not have a current prescription on file. RN called and spoke with Minette Brine at [facility] who confirmed they did not receive the year supply of Fluoxetine sent on 07-16-18. RN updated patient and advised would send refill request to Dr. Quincy Simmonds.

## 2018-09-27 MED ORDER — FLUOXETINE HCL 40 MG PO CAPS: ORAL_CAPSULE | ORAL | 2 refills | Status: DC

## 2018-09-28 NOTE — Progress Notes (Deleted)
Subjective:   Marissa Lee is a 68 y.o. female who presents for Medicare Annual (Subsequent) preventive examination.  Review of Systems:  No ROS.  Medicare Wellness Visit. Additional risk factors are reflected in the social history.    Sleep patterns: {SX; SLEEP PATTERNS:18802::"feels rested on waking","does not get up to void","gets up *** times nightly to void","sleeps *** hours nightly"}.    Home Safety/Smoke Alarms: Feels safe in home. Smoke alarms in place.  Living environment; residence and Firearm Safety: {Rehab home environment / accessibility:30080::"no firearms","firearms stored safely"}. Seat Belt Safety/Bike Helmet: Wears seat belt.      Objective:     Vitals: LMP 09/27/2003   There is no height or weight on file to calculate BMI.  Advanced Directives 05/23/2018 08/24/2016 08/23/2016 08/15/2016 01/28/2016  Does Patient Have a Medical Advance Directive? No No No No No  Would patient like information on creating a medical advance directive? - - Yes (MAU/Ambulatory/Procedural Areas - Information given) - No - patient declined information    Tobacco Social History   Tobacco Use  Smoking Status Former Smoker  . Packs/day: 1.00  . Years: 20.00  . Pack years: 20.00  . Types: Cigarettes  Smokeless Tobacco Never Used  Tobacco Comment   quit in approximately 2000     Counseling given: Not Answered Comment: quit in approximately 2000   Past Medical History:  Diagnosis Date  . Anxiety   . Endometriosis   . Fibrocystic breast    muliple drainage  . Fibroid   . Hiatal hernia   . History of bronchitis   . Hypercholesteremia 2007   currently not taking anything for it  . Osteopenia 08/2016   hip and spine  . Urge incontinence 06/05/2013   Past Surgical History:  Procedure Laterality Date  . ABDOMINAL HYSTERECTOMY    . BLADDER SUSPENSION  2004   done with TVH  . BREAST BIOPSY  1980   done 3x  . CLOSED REDUCTION METACARPAL WITH PERCUTANEOUS PINNING Right  08/24/2016   Procedure: CLOSED REDUCTION  WITH PERCUTANEOUS PINNING;  Surgeon: Iran Planas, MD;  Location: [facility];  Service: Orthopedics;  Laterality: Right;  . COLONOSCOPY    . COMBINED HYSTERECTOMY VAGINAL / OOPHORECTOMY / A&P REPAIR  2004  . FOOT SURGERY Right 1992  . OPEN REDUCTION INTERNAL FIXATION (ORIF) HAND Right 08/24/2016   Procedure: RIGHT HAND LONG RING AND SMALL FINGER  POSSIBLE ORIF;  Surgeon: Iran Planas, MD;  Location: [facility];  Service: Orthopedics;  Laterality: Right;  . WISDOM TOOTH EXTRACTION  1978   Family History  Problem Relation Age of Onset  . Cancer Paternal Grandmother        Colon   . Cancer Paternal Grandfather        Lung  . Heart disease Mother        valvular disease, valve replacement  . Atrial fibrillation Mother   . Stroke Mother   . Heart disease Brother        valve disease, valve surgery  . Thyroid disease Maternal Grandmother        Goiter  . Thyroid disease Daughter        Hypothyroid   Social History   Socioeconomic History  . Marital status: Single    Spouse name: Not on file  . Number of children: Not on file  . Years of education: Not on file  . Highest education level: Not on file  Occupational History  . Not on file  Social Needs  .  Financial resource strain: Not on file  . Food insecurity:    Worry: Not on file    Inability: Not on file  . Transportation needs:    Medical: Not on file    Non-medical: Not on file  Tobacco Use  . Smoking status: Former Smoker    Packs/day: 1.00    Years: 20.00    Pack years: 20.00    Types: Cigarettes  . Smokeless tobacco: Never Used  . Tobacco comment: quit in approximately 2000  Substance and Sexual Activity  . Alcohol use: Yes    Alcohol/week: 3.0 - 4.0 standard drinks    Types: 2 Cans of beer, 1 - 2 Standard drinks or equivalent per week    Comment: rarely  . Drug use: No  . Sexual activity: Never    Partners: Male    Birth control/protection: Surgical    Comment: Hyst    Lifestyle  . Physical activity:    Days per week: Not on file    Minutes per session: Not on file  . Stress: Not on file  Relationships  . Social connections:    Talks on phone: Not on file    Gets together: Not on file    Attends religious service: Not on file    Active member of club or organization: Not on file    Attends meetings of clubs or organizations: Not on file    Relationship status: Not on file  Other Topics Concern  . Not on file  Social History Narrative  . Not on file    Outpatient Encounter Medications as of 10/01/2018  Medication Sig  . azelastine (ASTELIN) 0.1 % nasal spray Place 1 spray into both nostrils 2 (two) times daily. Use in each nostril as directed  . BLACK COHOSH PO Take by mouth at bedtime.  . brompheniramine-pseudoephedrine-DM 30-2-10 MG/5ML syrup Take 10 mLs by mouth 3 (three) times daily as needed.  . Calcium-Phosphorus-Vitamin D (CITRACAL +D3 PO) Take by mouth daily.  Marland Kitchen Dextrose, Diabetic Use, (GLUCOSE PO) Take 1 tablet by mouth daily as needed. When feels like sugar is low  . doxycycline (VIBRA-TABS) 100 MG tablet Take 1 tablet (100 mg total) by mouth 2 (two) times daily.  Marland Kitchen ezetimibe (ZETIA) 10 MG tablet Take 1 tablet (10 mg total) by mouth daily. (Patient not taking: Reported on 07/16/2018)  . FLUoxetine (PROZAC) 40 MG capsule TAKE ONE CAPSULE BY MOUTH EVERY DAY  . pantoprazole (PROTONIX) 20 MG tablet Take 1 tablet (20 mg total) by mouth daily.   No facility-administered encounter medications on file as of 10/01/2018.     Activities of Daily Living No flowsheet data found.  Patient Care Team: Binnie Rail, MD as PCP - General (Internal Medicine)    Assessment:   This is a routine wellness examination for Marissa Lee. Physical assessment deferred to PCP.   Exercise Activities and Dietary recommendations   Diet (meal preparation, eat out, water intake, caffeinated beverages, dairy products, fruits and vegetables): {Desc;  diets:16563}   Goals   None     Fall Risk Fall Risk  09/29/2017 07/20/2015  Falls in the past year? No No   Depression Screen PHQ 2/9 Scores 09/29/2017 07/20/2015  PHQ - 2 Score 0 1     Cognitive Function        Immunization History  Administered Date(s) Administered  . Influenza, High Dose Seasonal PF 09/29/2017  . Pneumococcal Conjugate-13 10/16/2017  . Tdap 06/30/2016   Screening Tests Health Maintenance  Topic  Date Due  . INFLUENZA VACCINE  04/26/2018  . PNA vac Low Risk Adult (2 of 2 - PPSV23) 10/16/2018  . MAMMOGRAM  09/13/2020  . DEXA SCAN  09/13/2020  . COLONOSCOPY  10/06/2024  . TETANUS/TDAP  06/30/2026  . Hepatitis C Screening  Completed      Plan:      I have personally reviewed and noted the following in the patient's chart:   . Medical and social history . Use of alcohol, tobacco or illicit drugs  . Current medications and supplements . Functional ability and status . Nutritional status . Physical activity . Advanced directives . List of other physicians . Vitals . Screenings to include cognitive, depression, and falls . Referrals and appointments  In addition, I have reviewed and discussed with patient certain preventive protocols, quality metrics, and best practice recommendations. A written personalized care plan for preventive services as well as general preventive health recommendations were provided to patient.     Michiel Cowboy, RN  09/28/2018

## 2018-10-01 ENCOUNTER — Ambulatory Visit: Payer: Medicare Other | Admitting: Internal Medicine

## 2018-10-01 ENCOUNTER — Ambulatory Visit: Payer: BLUE CROSS/BLUE SHIELD

## 2018-10-03 NOTE — Progress Notes (Signed)
Subjective:    Patient ID: Marissa Lee, female    DOB: Feb 03, 1951, 68 y.o.   MRN: [id number]  HPI The patient is here for follow up.  Anxiety: She is taking Prozac daily as prescribed.  Medication is prescribed by her gynecologist.  She feels her anxiety is well controlled.  Hyperlipidemia: She was on Zetia, but is currently not taking it.  She is compliant with a low fat/cholesterol diet but she is not exercising regularly.  She is not opposed to restarting it as needed.  Hot flashes:  She has been having hot flashes at night.  She did discuss this with her gynecologist and she mentioned gabapentin and wanted to talk to me about restarting it.  She had taken it last year for lumbar radiculopathy and tolerated it well.  Now flashes are severe at night and significantly affecting her sleep.  GERD: She has a hiatal hernia and reflux related to that.  She is taking her medication daily as prescribed.  She denies any GERD symptoms and feels her GERD is well controlled.   Urine odor: She has an abnormal urine odor and wondered if that was concerning.  She denies any dysuria or hematuria.  She does have urinary frequency, but feels it is normal for her.  She is unsure if she drinks enough fluids or not.  Medications and allergies reviewed with patient and updated if appropriate.  Patient Active Problem List   Diagnosis Date Noted  . Hot flashes due to menopause 10/04/2018  . Low back pain radiating to left lower extremity 04/26/2018  . Anxiety 09/29/2017  . Osteopenia 09/28/2016  . Hyperlipidemia 09/28/2016  . 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 Vivid dream 07/20/2015  . Constipation 09/06/2013  . Post-menopause on HRT (hormone replacement therapy) 06/05/2013    Current Outpatient Medications on File Prior to Visit  Medication Sig Dispense Refill  . brompheniramine-pseudoephedrine-DM 30-2-10 MG/5ML syrup Take 10 mLs by mouth 3  (three) times daily as needed. 120 mL 0  . Calcium-Phosphorus-Vitamin D (CITRACAL +D3 PO) Take by mouth daily.    Marland Kitchen Dextrose, Diabetic Use, (GLUCOSE PO) Take 1 tablet by mouth daily as needed. When feels like sugar is low    . FLUoxetine (PROZAC) 40 MG capsule TAKE ONE CAPSULE BY MOUTH EVERY DAY 90 capsule 2  . pantoprazole (PROTONIX) 20 MG tablet Take 1 tablet (20 mg total) by mouth daily. 30 tablet 0  . ezetimibe (ZETIA) 10 MG tablet Take 1 tablet (10 mg total) by mouth daily. (Patient not taking: Reported on 07/16/2018) 90 tablet 2   No current facility-administered medications on file prior to visit.     Past Medical History:  Diagnosis Date  . Anxiety   . Endometriosis   . Fibrocystic breast    muliple drainage  . Fibroid   . Hiatal hernia   . History of bronchitis   . Hypercholesteremia 2007   currently not taking anything for it  . Osteopenia 08/2016   hip and spine  . Urge incontinence 06/05/2013    Past Surgical History:  Procedure Laterality Date  . ABDOMINAL HYSTERECTOMY    . BLADDER SUSPENSION  2004   done with TVH  . BREAST BIOPSY  1980   done 3x  . CLOSED REDUCTION METACARPAL WITH PERCUTANEOUS PINNING Right 08/24/2016   Procedure: CLOSED REDUCTION  WITH PERCUTANEOUS PINNING;  Surgeon: Iran Planas, MD;  Location: [facility];  Service: Orthopedics;  Laterality: Right;  .  COLONOSCOPY    . COMBINED HYSTERECTOMY VAGINAL / OOPHORECTOMY / A&P REPAIR  2004  . FOOT SURGERY Right 1992  . OPEN REDUCTION INTERNAL FIXATION (ORIF) HAND Right 08/24/2016   Procedure: RIGHT HAND LONG RING AND SMALL FINGER  POSSIBLE ORIF;  Surgeon: Iran Planas, MD;  Location: [facility];  Service: Orthopedics;  Laterality: Right;  . WISDOM TOOTH EXTRACTION  1978    Social History   Socioeconomic History  . Marital status: Single    Spouse name: Not on file  . Number of children: Not on file  . Years of education: Not on file  . Highest education level: Not on file  Occupational History  . Not  on file  Social Needs  . Financial resource strain: Not on file  . Food insecurity:    Worry: Not on file    Inability: Not on file  . Transportation needs:    Medical: Not on file    Non-medical: Not on file  Tobacco Use  . Smoking status: Former Smoker    Packs/day: 1.00    Years: 20.00    Pack years: 20.00    Types: Cigarettes  . Smokeless tobacco: Never Used  . Tobacco comment: quit in approximately 2000  Substance and Sexual Activity  . Alcohol use: Yes    Alcohol/week: 3.0 - 4.0 standard drinks    Types: 2 Cans of beer, 1 - 2 Standard drinks or equivalent per week    Comment: rarely  . Drug use: No  . Sexual activity: Never    Partners: Male    Birth control/protection: Surgical    Comment: Hyst  Lifestyle  . Physical activity:    Days per week: Not on file    Minutes per session: Not on file  . Stress: Not on file  Relationships  . Social connections:    Talks on phone: Not on file    Gets together: Not on file    Attends religious service: Not on file    Active member of club or organization: Not on file    Attends meetings of clubs or organizations: Not on file    Relationship status: Not on file  Other Topics Concern  . Not on file  Social History Narrative  . Not on file    Family History  Problem Relation Age of Onset  . Cancer Paternal Grandmother        Colon   . Cancer Paternal Grandfather        Lung  . Heart disease Mother        valvular disease, valve replacement  . Atrial fibrillation Mother   . Stroke Mother   . Heart disease Brother        valve disease, valve surgery  . Thyroid disease Maternal Grandmother        Goiter  . Thyroid disease Daughter        Hypothyroid    Review of Systems  Constitutional: Negative for chills and fever.  Respiratory: Negative for cough, shortness of breath and wheezing.   Cardiovascular: Negative for chest pain, palpitations and leg swelling.  Gastrointestinal: Negative for abdominal pain, blood  in stool, constipation, diarrhea and nausea.  Genitourinary: Positive for frequency (normal ). Negative for dysuria and hematuria.       Urine odor  Neurological: Negative for light-headedness and headaches.       Objective:   Vitals:   10/04/18 1107  BP: 134/80  Pulse: 66  Resp: 16  Temp: 97.9  F (36.6 C)  SpO2: 99%   BP Readings from Last 3 Encounters:  10/04/18 134/80  08/10/18 124/78  07/16/18 132/70   Wt Readings from Last 3 Encounters:  10/04/18 143 lb 12.8 oz (65.2 kg)  08/10/18 147 lb (66.7 kg)  07/16/18 141 lb 6.4 oz (64.1 kg)   Body mass index is 25.47 kg/m.   Physical Exam    Constitutional: Appears well-developed and well-nourished. No distress.  HENT:  Head: Normocephalic and atraumatic.  Neck: Neck supple. No tracheal deviation present. No thyromegaly present.  No cervical lymphadenopathy Cardiovascular: Normal rate, regular rhythm and normal heart sounds.   No murmur heard. No carotid bruit .  No edema Pulmonary/Chest: Effort normal and breath sounds normal. No respiratory distress. No has no wheezes. No rales.  Abdomen: soft, NT, ND Skin: Skin is warm and dry. Not diaphoretic.  Psychiatric: Normal mood and affect. Behavior is normal.      Assessment & Plan:    See Problem List for Assessment and Plan of chronic medical problems.

## 2018-10-04 ENCOUNTER — Encounter: Payer: Self-pay | Admitting: Internal Medicine

## 2018-10-04 ENCOUNTER — Other Ambulatory Visit: Payer: Self-pay | Admitting: Emergency Medicine

## 2018-10-04 ENCOUNTER — Other Ambulatory Visit: Payer: Self-pay | Admitting: Internal Medicine

## 2018-10-04 ENCOUNTER — Other Ambulatory Visit (INDEPENDENT_AMBULATORY_CARE_PROVIDER_SITE_OTHER): Payer: Medicare Other

## 2018-10-04 ENCOUNTER — Ambulatory Visit (INDEPENDENT_AMBULATORY_CARE_PROVIDER_SITE_OTHER): Payer: Medicare Other | Admitting: Internal Medicine

## 2018-10-04 VITALS — BP 134/80 | HR 66 | Temp 97.9°F | Resp 16 | Ht 63.0 in | Wt 143.8 lb

## 2018-10-04 DIAGNOSIS — R8290 Unspecified abnormal findings in urine: Secondary | ICD-10-CM | POA: Diagnosis not present

## 2018-10-04 DIAGNOSIS — E7849 Other hyperlipidemia: Secondary | ICD-10-CM | POA: Diagnosis not present

## 2018-10-04 DIAGNOSIS — M545 Low back pain, unspecified: Secondary | ICD-10-CM

## 2018-10-04 DIAGNOSIS — K219 Gastro-esophageal reflux disease without esophagitis: Secondary | ICD-10-CM | POA: Insufficient documentation

## 2018-10-04 DIAGNOSIS — M79605 Pain in left leg: Secondary | ICD-10-CM

## 2018-10-04 DIAGNOSIS — Z23 Encounter for immunization: Secondary | ICD-10-CM | POA: Diagnosis not present

## 2018-10-04 DIAGNOSIS — E039 Hypothyroidism, unspecified: Secondary | ICD-10-CM | POA: Insufficient documentation

## 2018-10-04 DIAGNOSIS — F419 Anxiety disorder, unspecified: Secondary | ICD-10-CM | POA: Diagnosis not present

## 2018-10-04 DIAGNOSIS — Z0000 Encounter for general adult medical examination without abnormal findings: Secondary | ICD-10-CM | POA: Diagnosis not present

## 2018-10-04 DIAGNOSIS — N951 Menopausal and female climacteric states: Secondary | ICD-10-CM

## 2018-10-04 DIAGNOSIS — E038 Other specified hypothyroidism: Secondary | ICD-10-CM | POA: Insufficient documentation

## 2018-10-04 DIAGNOSIS — M8580 Other specified disorders of bone density and structure, unspecified site: Secondary | ICD-10-CM

## 2018-10-04 HISTORY — DX: Other specified hypothyroidism: E03.8

## 2018-10-04 LAB — CBC WITH DIFFERENTIAL/PLATELET
BASOS PCT: 1.1 % (ref 0.0–3.0)
Basophils Absolute: 0.1 10*3/uL (ref 0.0–0.1)
EOS PCT: 2.6 % (ref 0.0–5.0)
Eosinophils Absolute: 0.2 10*3/uL (ref 0.0–0.7)
HCT: 41.5 % (ref 36.0–46.0)
Hemoglobin: 14.1 g/dL (ref 12.0–15.0)
Lymphocytes Relative: 38.6 % (ref 12.0–46.0)
Lymphs Abs: 2.5 10*3/uL (ref 0.7–4.0)
MCHC: 34.1 g/dL (ref 30.0–36.0)
MCV: 92.5 fl (ref 78.0–100.0)
Monocytes Absolute: 0.6 10*3/uL (ref 0.1–1.0)
Monocytes Relative: 9.1 % (ref 3.0–12.0)
Neutro Abs: 3.1 10*3/uL (ref 1.4–7.7)
Neutrophils Relative %: 48.6 % (ref 43.0–77.0)
Platelets: 336 10*3/uL (ref 150.0–400.0)
RBC: 4.49 Mil/uL (ref 3.87–5.11)
RDW: 13.1 % (ref 11.5–15.5)
WBC: 6.4 10*3/uL (ref 4.0–10.5)

## 2018-10-04 LAB — COMPREHENSIVE METABOLIC PANEL
ALBUMIN: 4.2 g/dL (ref 3.5–5.2)
ALT: 15 U/L (ref 0–35)
AST: 18 U/L (ref 0–37)
Alkaline Phosphatase: 42 U/L (ref 39–117)
BUN: 13 mg/dL (ref 6–23)
CALCIUM: 9.6 mg/dL (ref 8.4–10.5)
CHLORIDE: 104 meq/L (ref 96–112)
CO2: 29 mEq/L (ref 19–32)
Creatinine, Ser: 0.84 mg/dL (ref 0.40–1.20)
GFR: 72.08 mL/min (ref >60.00)
Glucose, Bld: 94 mg/dL (ref 70–99)
POTASSIUM: 4.1 meq/L (ref 3.5–5.1)
SODIUM: 141 meq/L (ref 135–145)
Total Bilirubin: 0.5 mg/dL (ref 0.2–1.2)
Total Protein: 7 g/dL (ref 6.0–8.3)

## 2018-10-04 LAB — URINALYSIS, ROUTINE W REFLEX MICROSCOPIC
Bilirubin Urine: NEGATIVE
Hgb urine dipstick: NEGATIVE
Ketones, ur: NEGATIVE
Leukocytes, UA: NEGATIVE
Nitrite: NEGATIVE
PH: 5 (ref 5.0–8.0)
Total Protein, Urine: NEGATIVE
Urine Glucose: NEGATIVE
Urobilinogen, UA: 0.2 (ref 0.0–1.0)

## 2018-10-04 LAB — LIPID PANEL
Cholesterol: 308 mg/dL — ABNORMAL HIGH (ref 0–200)
HDL: 55.3 mg/dL (ref >39.00)
LDL Cholesterol: 218 mg/dL — ABNORMAL HIGH (ref 0–99)
NonHDL: 252.25
Total CHOL/HDL Ratio: 6
Triglycerides: 171 mg/dL — ABNORMAL HIGH (ref 0.0–149.0)
VLDL: 34.2 mg/dL (ref 0.0–40.0)

## 2018-10-04 LAB — TSH: TSH: 5.23 u[IU]/mL — AB (ref 0.35–4.50)

## 2018-10-04 MED ORDER — GABAPENTIN 100 MG PO CAPS: ORAL_CAPSULE | ORAL | 3 refills | Status: DC

## 2018-10-04 NOTE — Patient Instructions (Signed)
  Tests ordered today. Your results will be released to [facility] (or called to you) after review, usually within 72hours after test completion. If any changes need to be made, you will be notified at that same time.  All other Health Maintenance issues reviewed.   All recommended immunizations and age-appropriate screenings are up-to-date or discussed.  Flu immunization administered today.    Medications reviewed and updated.  Changes include :   Restart gabapentin at night   Please followup in one year for a physical

## 2018-10-04 NOTE — Assessment & Plan Note (Addendum)
At night - very disruptive Taking fluoxetine, but still having reflux Has tried black cohosh without improvement Will restart gabapentin 100 mg - 300 mg at night to see if that helps-she has tolerated this in the past

## 2018-10-04 NOTE — Assessment & Plan Note (Signed)
dexa up to date - done via gyn Taking calcium and vitmain d rec regular exercise

## 2018-10-04 NOTE — Assessment & Plan Note (Signed)
Dehydration versus possible infection Urinalysis, urine culture Increase fluids

## 2018-10-04 NOTE — Assessment & Plan Note (Signed)
Prozac prescribed by GYN Anxiety currently well controlled We will continue her current dose

## 2018-10-04 NOTE — Assessment & Plan Note (Signed)
GERD controlled Continue daily medication  

## 2018-10-04 NOTE — Assessment & Plan Note (Signed)
Completed PT Saw Dr Maxie Better  Encouraged regular exercise and back exercises

## 2018-10-04 NOTE — Assessment & Plan Note (Signed)
Has not taken zetia in months Lipid, cmp, tsh Regular exercise and healthy diet encouraged

## 2018-10-05 LAB — URINE CULTURE
MICRO NUMBER:: 33462
Result:: NO GROWTH
SPECIMEN QUALITY: ADEQUATE

## 2018-10-05 MED ORDER — EZETIMIBE 10 MG PO TABS: 10.0000 mg | ORAL_TABLET | Freq: Every day | ORAL | 1 refills | Status: DC

## 2019-04-10 ENCOUNTER — Other Ambulatory Visit: Payer: Self-pay | Admitting: Internal Medicine

## 2019-04-10 DIAGNOSIS — Z20822 Contact with and (suspected) exposure to covid-19: Secondary | ICD-10-CM

## 2019-04-15 LAB — NOVEL CORONAVIRUS, NAA: SARS-CoV-2, NAA: NOT DETECTED

## 2019-04-20 IMAGING — MR MR LUMBAR SPINE W/O CM
4 of 5 series · 18 of 48 positions shown · non-contrast
Comparison: Lumbar spine radiographs [DATE].

CLINICAL DATA: Low back pain extending into the left hip and groin.
Patient states she woke with this pain [REDACTED]. Constipation.

EXAM:
MRI LUMBAR SPINE WITHOUT CONTRAST
TECHNIQUE: Multiplanar, multisequence MR imaging of the lumbar spine was
performed. No intravenous contrast was administered.

[Series 6: T2 · sagittal · 4.0mm · 0.73mm/px · 6 of 15 slices shown (1 of 2)]
[im 1/15]
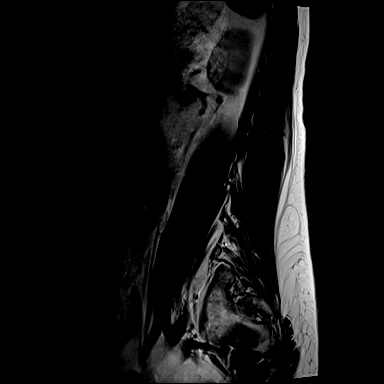
[im 3/15]
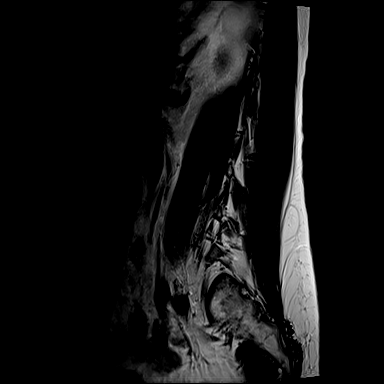
[im 6/15]
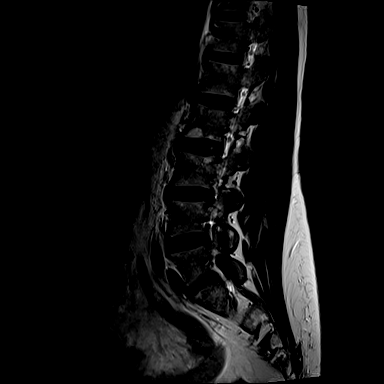
[im 9/15]
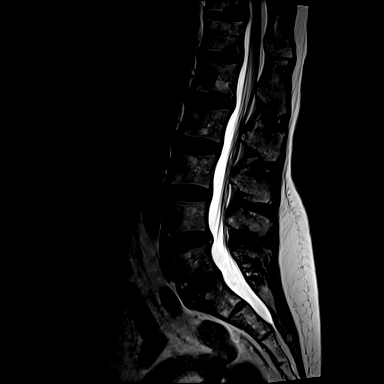
[im 12/15]
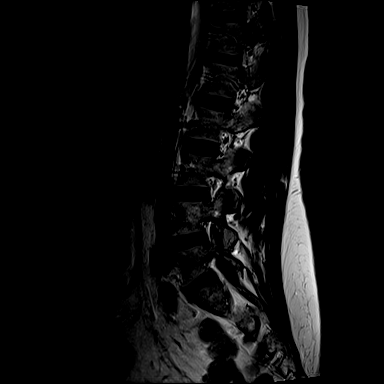
[im 15/15]
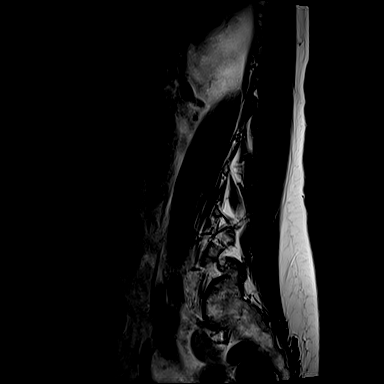

[Series 7: T1 · sagittal · 4.0mm · 0.73mm/px · 3 of 15 slices shown (1 of 2)]
[im 3/15]
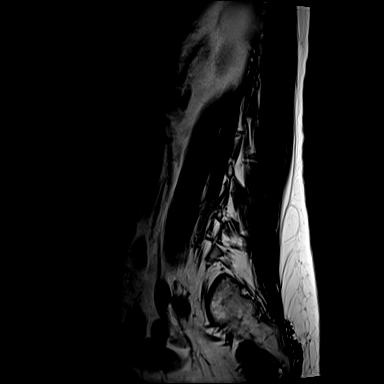
[im 9/15]
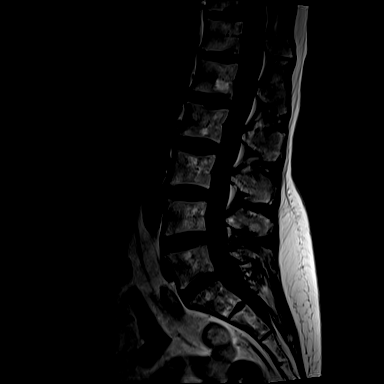
[im 15/15]
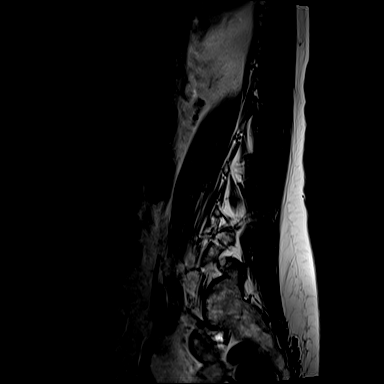

[Series 13: T2 · axial · 4.0mm · 0.28mm/px · z∈[+6,+187]mm · 6 of 39 slices shown (2 of 2)]
[im 1/39]
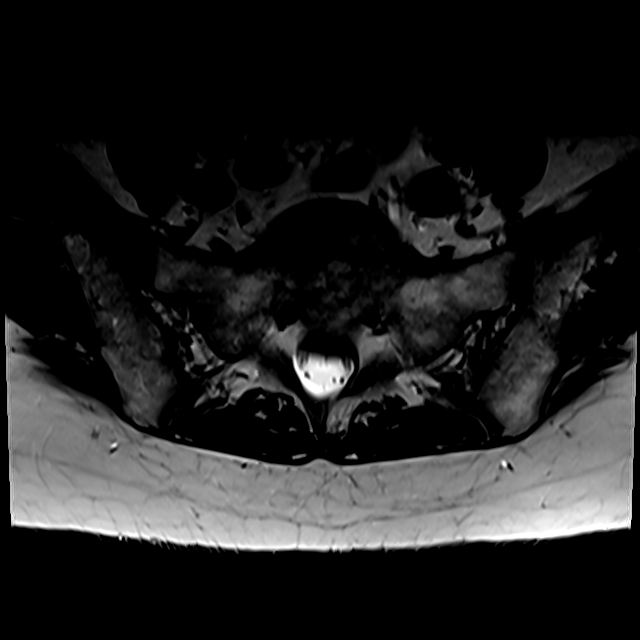
[im 6/39]
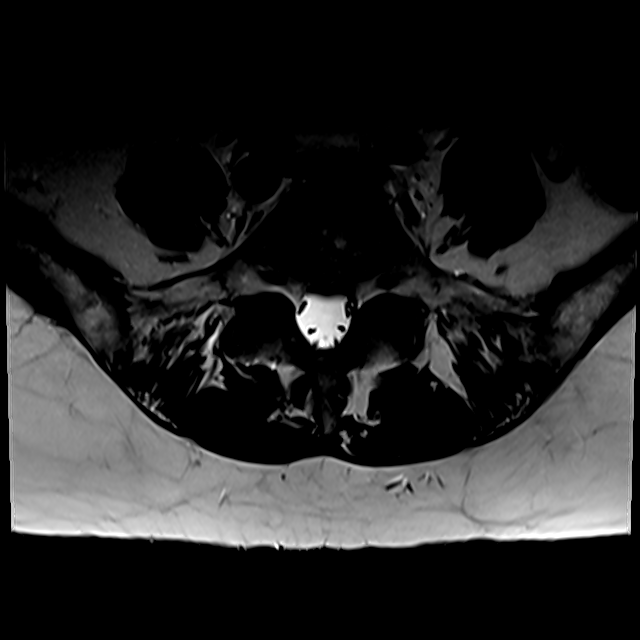
[im 11/39]
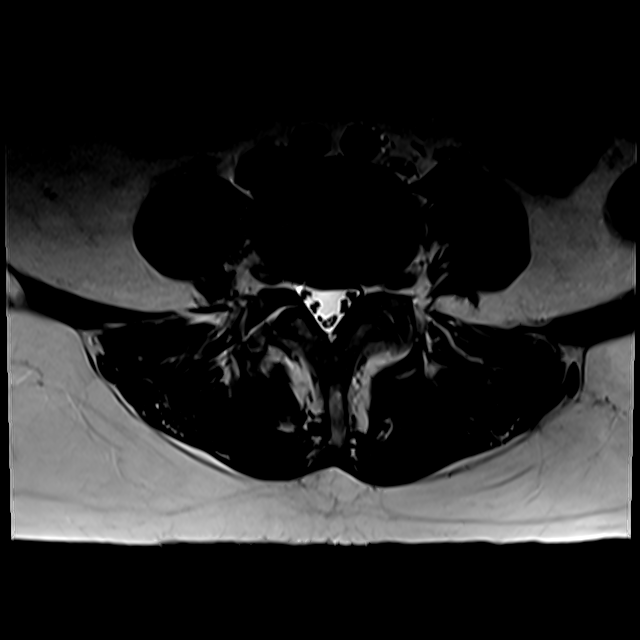
[im 17/39]
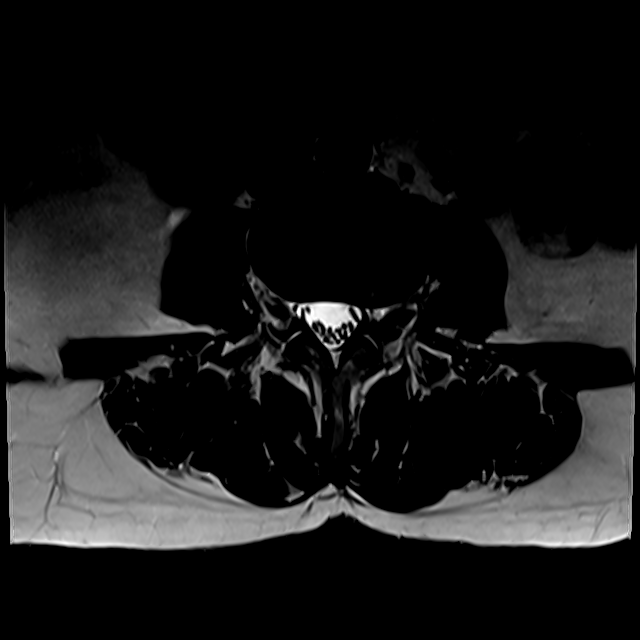
[im 20/39]
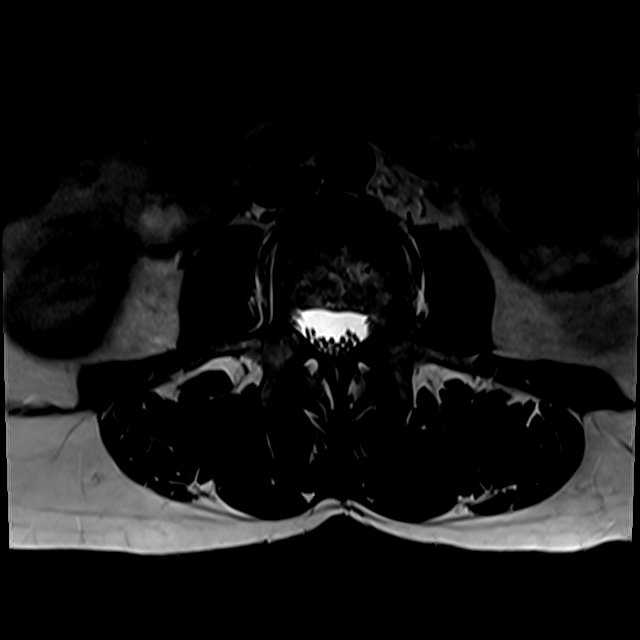
[im 33/39]
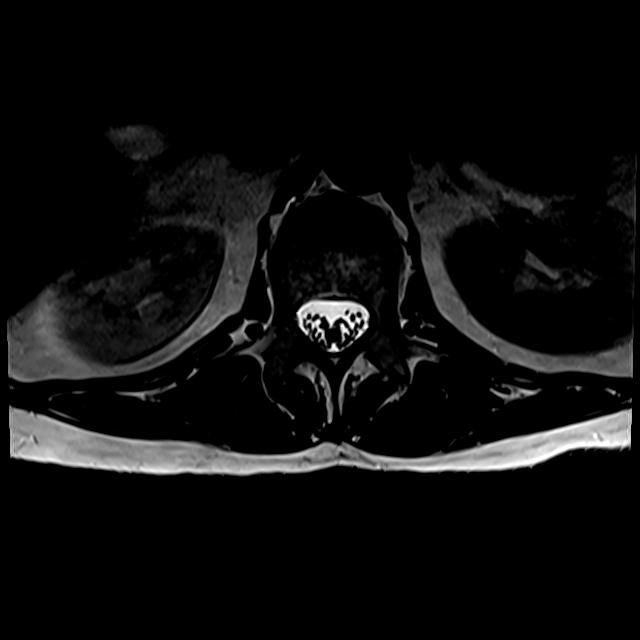

[Series 100: T1 · axial · 4.0mm · 0.28mm/px · z∈[+31,+187]mm · 3 of 39 slices shown (2 of 2)]
[im 6/39]
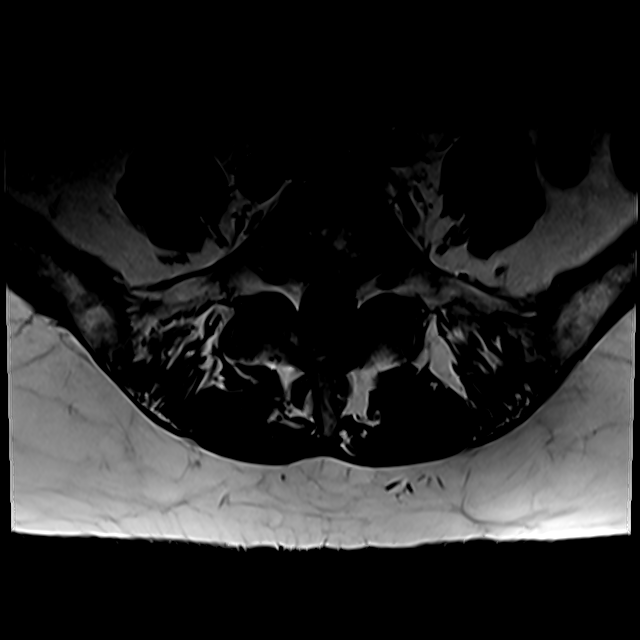
[im 20/39]
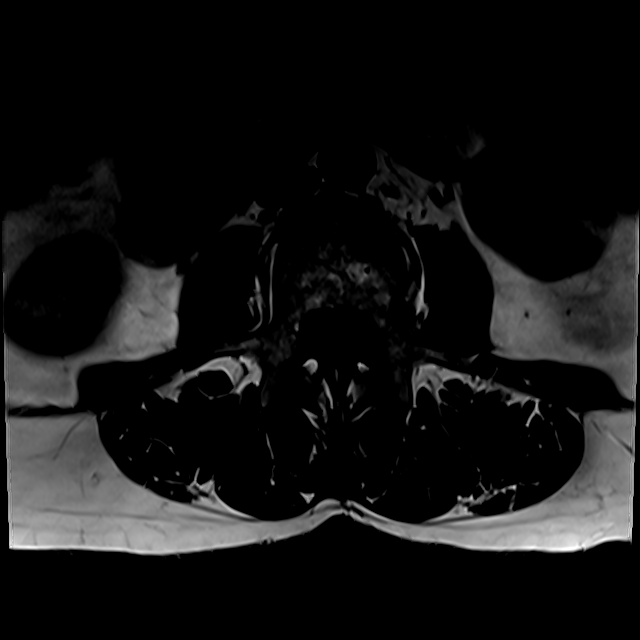
[im 33/39]
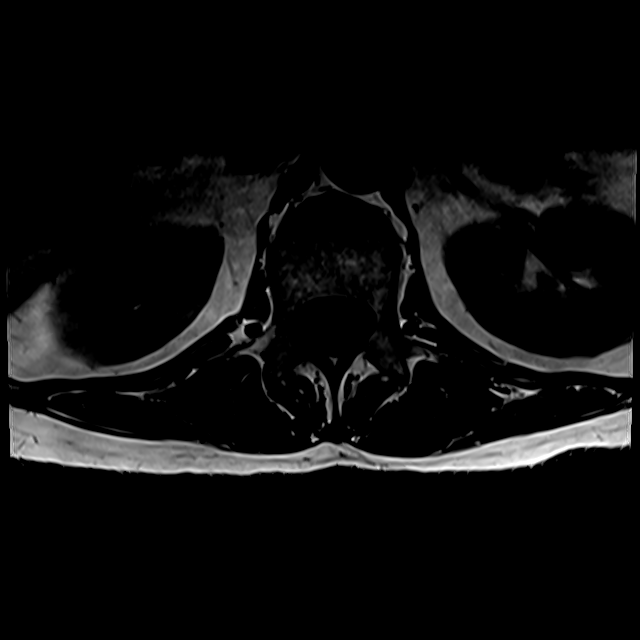

[18 of 48 positions shown; findings below may reference images not displayed]

FINDINGS: Segmentation: 5 non rib-bearing lumbar type vertebral bodies are
present. The last fully formed vertebral body is L5.

Alignment:  AP alignment is anatomic.

Vertebrae: Heterogeneous marrow signal is present. Hemangiomas are
present at L1 and L4. Vertebral body heights are maintained.
Inferior endplate Schmorl's nodes are present at T11 and T12.

Conus medullaris and cauda equina: Conus extends to the L1-2 level.
Conus and cauda equina appear normal.

Paraspinal and other soft tissues: Limited imaging the abdomen is
unremarkable.

Disc levels:

L1-2: Negative.

L2-3: Negative.

L3-4: A far left lateral disc protrusion extends into the left
foramen. This contacts and displaces the left L3 nerve root beyond
the foramen.

L4-5: A shallow central disc protrusion and annular tear is present.
Mild subarticular narrowing is present bilaterally. Mild bilateral
foraminal narrowing is present.

L5-S1: Slight disc bulging is present without significant stenosis.
IMPRESSION: 1. Far left lateral disc protrusion at L3-4 contacts and displaces
the left L3 nerve root beyond the foramen.
2. Shallow central disc protrusion and annular tear at L4-5 with
mild subarticular and foraminal narrowing bilaterally.
3. Slight disc bulging at L5-S1 without significant stenosis.

## 2019-05-12 IMAGING — CT CT ANGIO CHEST
2 of 7 series · 19 of 46 positions shown · IV contrast (iopamidol)
Comparison: Chest radiograph August 10, 2018

CLINICAL DATA: Shortness of breath at doctor's office today.

EXAM:
CT ANGIOGRAPHY CHEST WITH CONTRAST
TECHNIQUE: Multidetector CT imaging of the chest was performed using the
standard protocol during bolus administration of intravenous
contrast. Multiplanar CT image reconstructions and MIPs were
obtained to evaluate the vascular anatomy.
CONTRAST:  100 mL 59KBU2-YSU IOPAMIDOL (59KBU2-YSU) INJECTION 76%

[Series 8: thins · axial · 0.77mm/px · z∈[+1002,+1264]mm · 16 of 422 slices shown]
[im 24/422  lung]
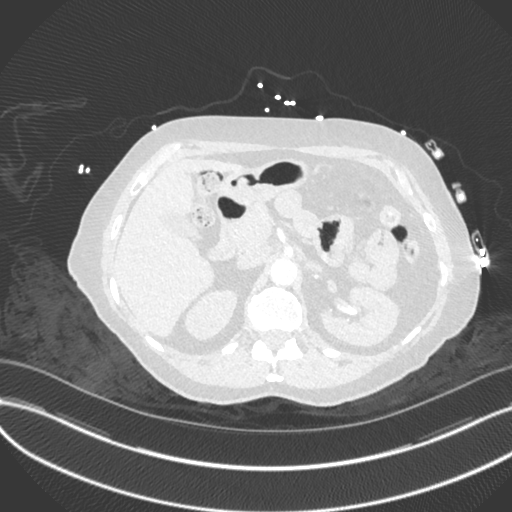
[im 47/422  soft-tissue]
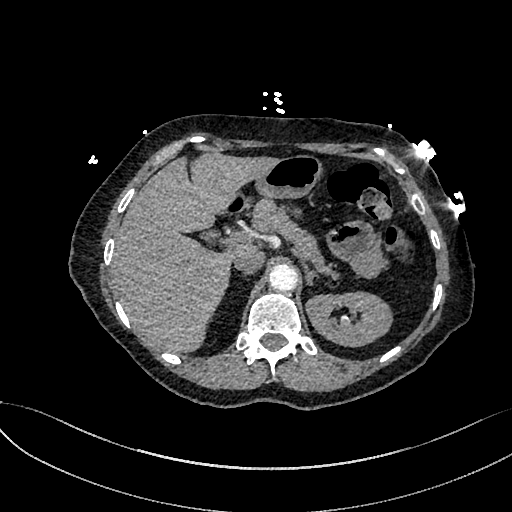
[im 71/422  lung]
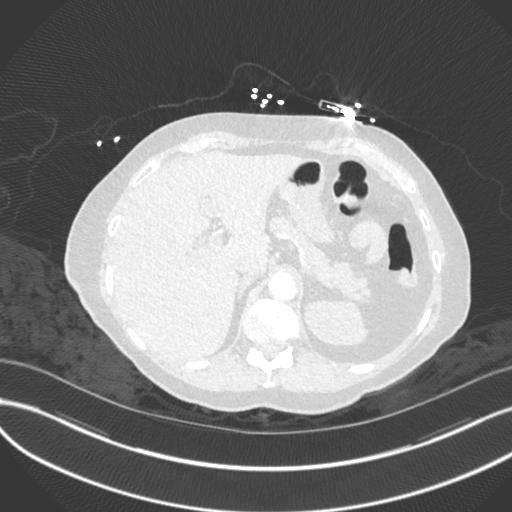
[im 94/422  soft-tissue]
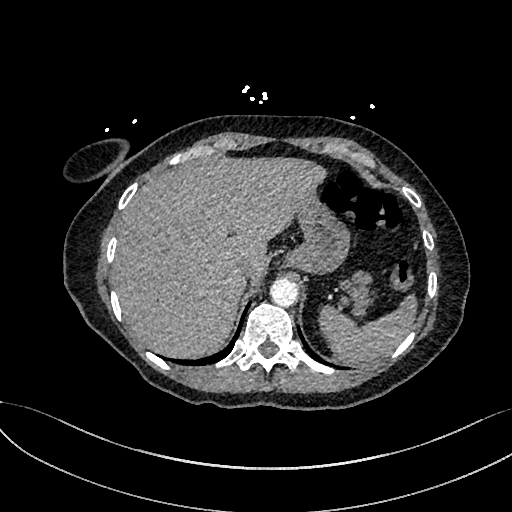
[im 117/422  lung]
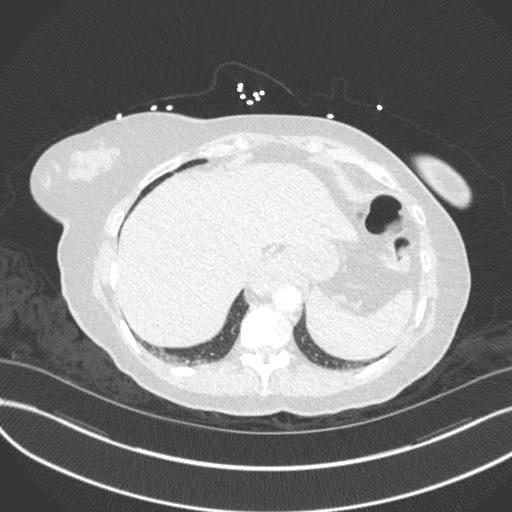
[im 141/422  soft-tissue]
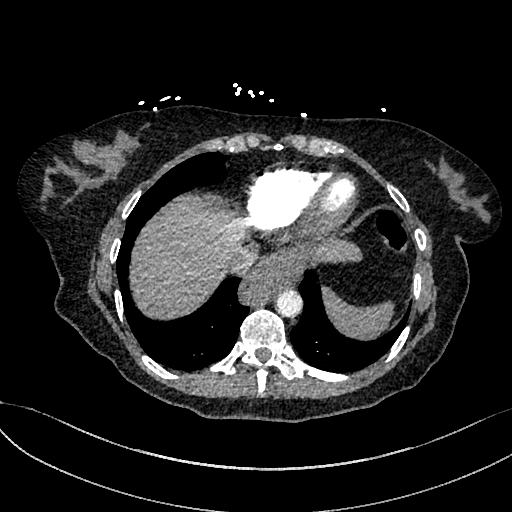
[im 164/422  lung]
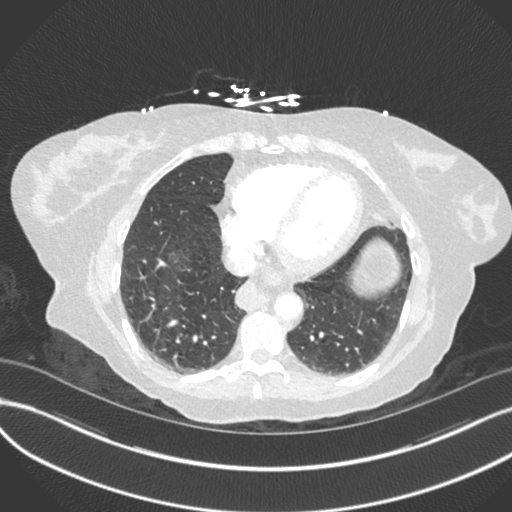
[im 188/422  soft-tissue]
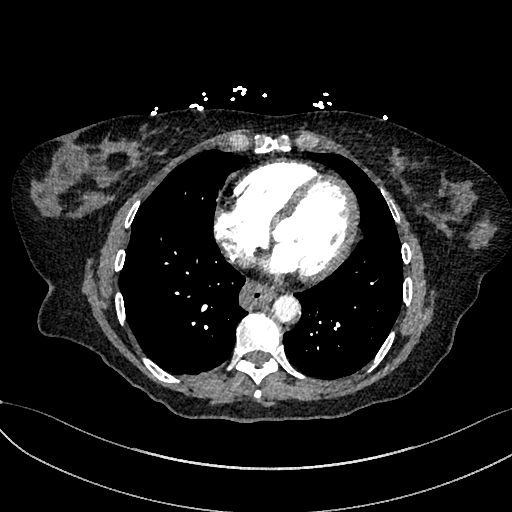
[im 234/422  lung]
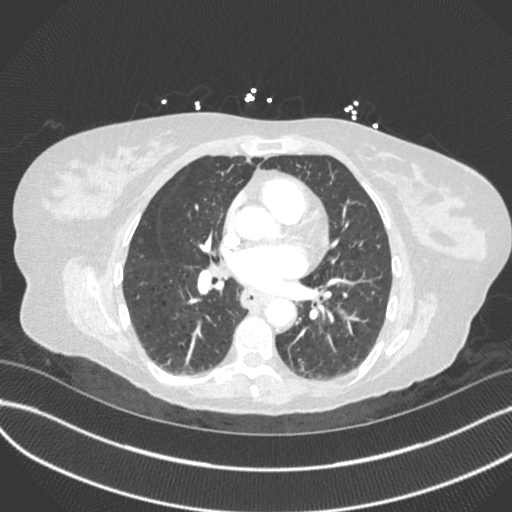
[im 258/422  soft-tissue]
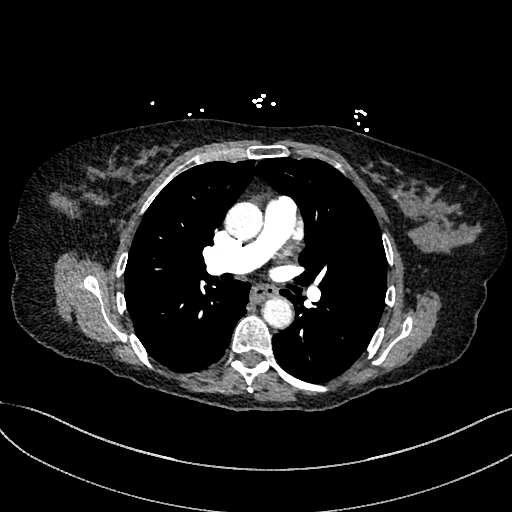
[im 281/422  lung]
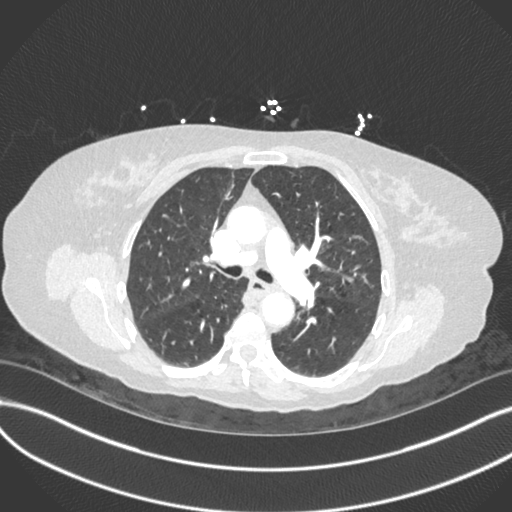
[im 305/422  soft-tissue]
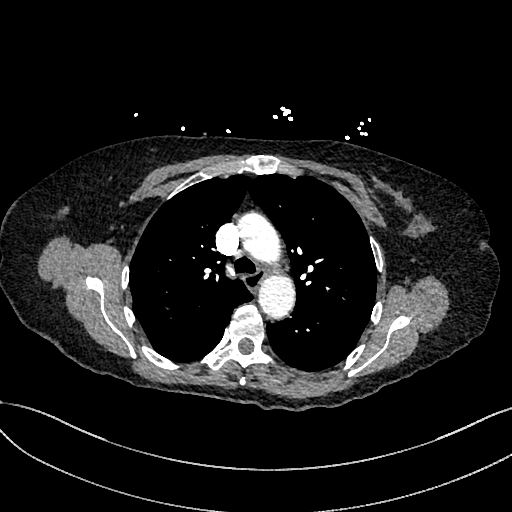
[im 328/422  lung]
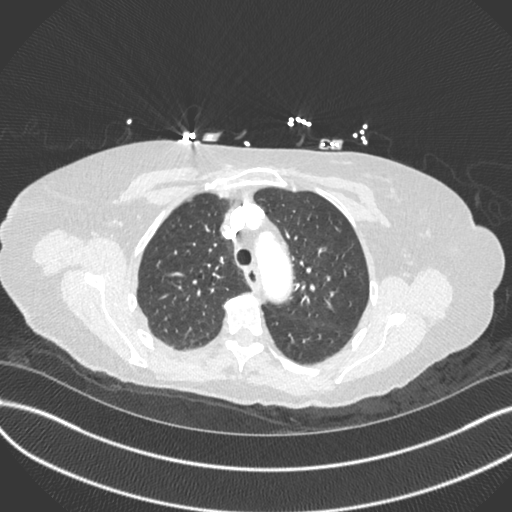
[im 351/422  soft-tissue]
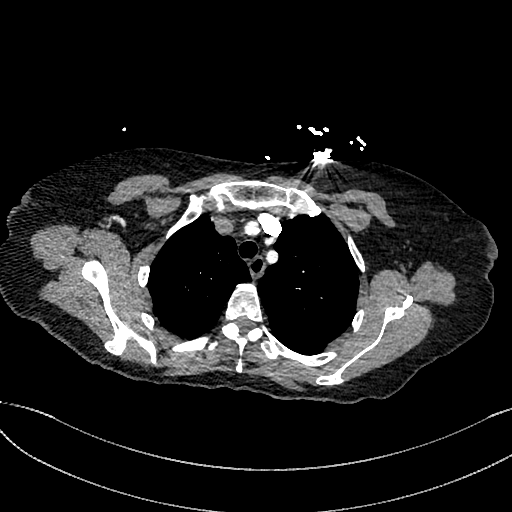
[im 375/422  lung]
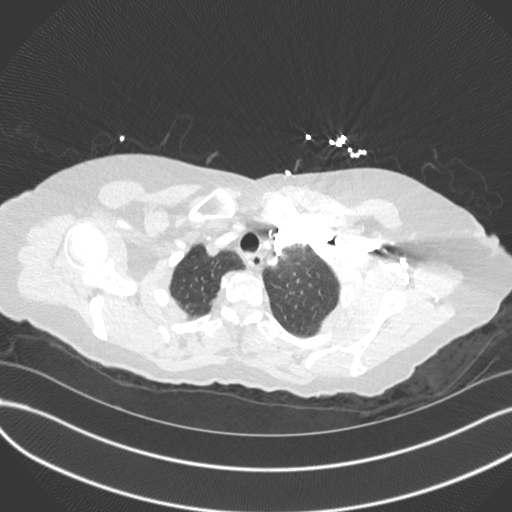
[im 398/422  soft-tissue]
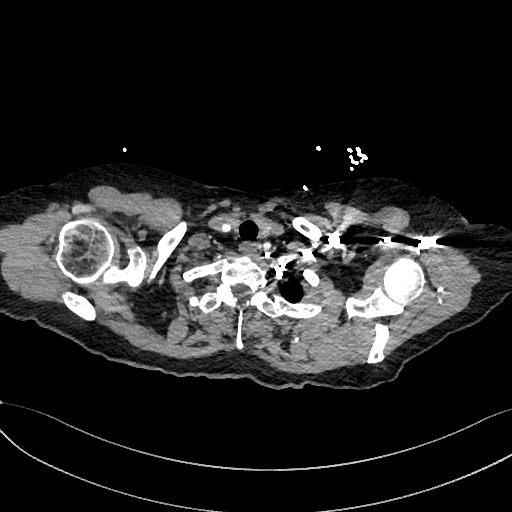

[Series 9: cor · coronal · 0.64mm/px · 3 of 124 slices shown]
[im 31/124  soft-tissue]
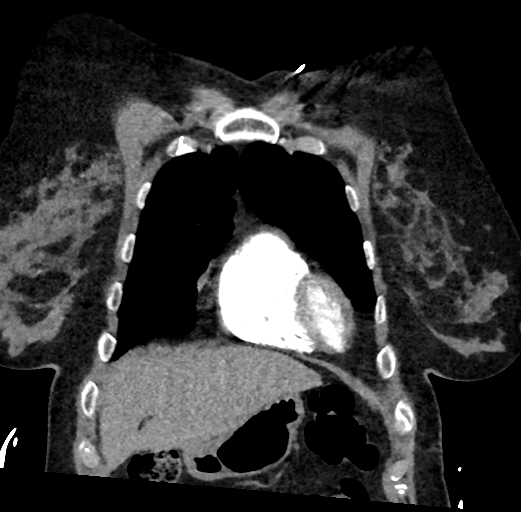
[im 62/124  soft-tissue]
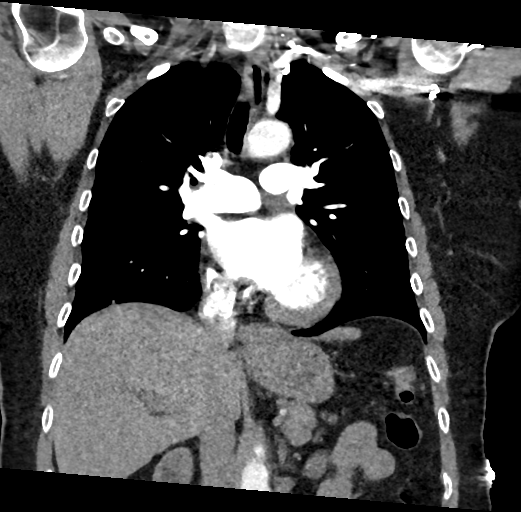
[im 93/124  soft-tissue]
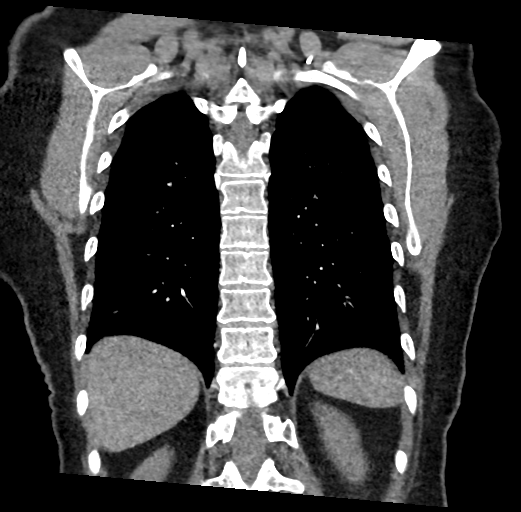

[19 of 46 positions shown; findings below may reference images not displayed]

FINDINGS: CARDIOVASCULAR: Adequate contrast opacification of the pulmonary
artery's. Main pulmonary artery is not enlarged. No pulmonary
arterial filling defects to the level of the subsegmental branches.
Heart size is normal, no right heart strain. No pericardial
effusion. Thoracic aorta is normal course and caliber, mild intimal
thickening and calcific atherosclerosis descending aorta.

MEDIASTINUM/NODES: No lymphadenopathy by CT size criteria.

LUNGS/PLEURA: Tracheobronchial tree is patent, no pneumothorax. Mild
bronchial wall thickening. Mild centrilobular emphysema. No pleural
effusions, focal consolidations, pulmonary nodules or masses.

UPPER ABDOMEN: Non-acute. Small hiatal hernia with thickened
esophagus, no paraesophageal inflammation.

MUSCULOSKELETAL: Non-acute.  Punctate breast calcifications.

Review of the MIP images confirms the above findings.
IMPRESSION: 1. No acute pulmonary embolism.
2. Bronchial wall thickening seen with bronchitis and reactive
airway disease. No pneumonia.
3. Thickened esophagus, possibly redundant though esophagitis not
excluded.

Emphysema (RH028-521.7). Aortic Atherosclerosis (RH028-XF7.7).

## 2019-06-25 DIAGNOSIS — M25512 Pain in left shoulder: Secondary | ICD-10-CM | POA: Insufficient documentation

## 2019-07-19 ENCOUNTER — Other Ambulatory Visit: Payer: Self-pay

## 2019-07-19 DIAGNOSIS — Z20822 Contact with and (suspected) exposure to covid-19: Secondary | ICD-10-CM

## 2019-07-20 LAB — NOVEL CORONAVIRUS, NAA: SARS-CoV-2, NAA: NOT DETECTED

## 2019-07-23 NOTE — Progress Notes (Signed)
68 y.o. [id number] [name] here for annual exam.    Had flu vaccine last week.  Patient complainine of night sweats.  Some urinary burning for one week. No blood in urine.   Taking Prozac for anxiety.  Thinks it is helping.   Went to [place] recently. Stress upon returning.   Helping to care for grandchildren.   Urine dip: Trace WBCs  PCP:  Billey Gosling, MD   Patient's last menstrual period was 09/27/2003.           Sexually active: No.  The current method of family planning is status post hysterectomy.    Exercising: No.  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  Flu vaccine:  Completed last week [facility]   reports that she has quit smoking. Her smoking use included cigarettes. She has a 20.00 pack-year smoking history. She has never used smokeless tobacco. She reports current alcohol use of about 3.0 - 4.0 standard drinks of alcohol per week. She reports that she does not use drugs.  Past Medical History:  Diagnosis Date  . Anxiety   . Endometriosis   . Fibrocystic breast    muliple drainage  . Fibroid   . Hiatal hernia   . History of bronchitis   . Hypercholesteremia 2007   currently not taking anything for it  . Osteopenia 08/2016   hip and spine  . Subclinical hypothyroidism 10/04/2018  . Urge incontinence 06/05/2013    Past Surgical History:  Procedure Laterality Date  . ABDOMINAL HYSTERECTOMY    . BLADDER SUSPENSION  2004   done with TVH  . BREAST BIOPSY  1980   done 3x  . CLOSED REDUCTION METACARPAL WITH PERCUTANEOUS PINNING Right 08/24/2016   Procedure: CLOSED REDUCTION  WITH PERCUTANEOUS PINNING;  Surgeon:  Iran Planas, MD;  Location: [place];  Service: Orthopedics;  Laterality: Right;  . COLONOSCOPY    . COMBINED HYSTERECTOMY VAGINAL / OOPHORECTOMY / A&P REPAIR  2004  . FOOT SURGERY Right 1992  . OPEN REDUCTION INTERNAL FIXATION (ORIF) HAND Right 08/24/2016   Procedure: RIGHT HAND LONG RING AND SMALL FINGER  POSSIBLE ORIF;  Surgeon: Iran Planas, MD;  Location: [place];  Service: Orthopedics;  Laterality: Right;  . WISDOM TOOTH EXTRACTION  1978    Current Outpatient Medications  Medication Sig Dispense Refill  . Calcium-Phosphorus-Vitamin D (CITRACAL +D3 PO) Take by mouth daily.    Marland Kitchen Dextrose, Diabetic Use, (GLUCOSE PO) Take 1 tablet by mouth daily as needed. When feels like sugar is low    . ELDERBERRY PO Take 1 tablet by mouth daily.    Marland Kitchen FLUoxetine (PROZAC) 40 MG capsule TAKE ONE CAPSULE BY MOUTH EVERY DAY 90 capsule 2   No current facility-administered medications for this visit.     Family History  Problem Relation Age of Onset  . Cancer Paternal Grandmother        Colon   . Cancer Paternal Grandfather        Lung  . Heart disease Mother        valvular disease, valve replacement  . Atrial fibrillation Mother   . Stroke Mother   . Heart disease Brother  valve disease, valve surgery  . Thyroid disease Maternal Grandmother        Goiter  . Thyroid disease Daughter        Hypothyroid    Review of Systems  Constitutional:       Complaining of body odor even with deodorant  Genitourinary: Positive for frequency.       Burning with urination  All other systems reviewed and are negative.   Exam:   BP 110/68   Pulse 66   Temp (!) 97.2 F (36.2 C) (Temporal)   Resp 14   Ht 5' 3.75" (1.619 m)   Wt 135 lb 12.8 oz (61.6 kg)   LMP 09/27/2003   BMI 23.49 kg/m     General appearance: alert, cooperative and appears stated age Head: normocephalic, without obvious abnormality, atraumatic Neck: no adenopathy, supple, symmetrical, trachea midline and thyroid normal to  inspection and palpation Lungs: clear to auscultation bilaterally Breasts: normal appearance, no masses or tenderness, No nipple retraction or dimpling, No nipple discharge or bleeding, No axillary adenopathy Heart: regular rate and rhythm Abdomen: soft, non-tender; no masses, no organomegaly Extremities: extremities normal, atraumatic, no cyanosis or edema Skin: skin color, texture, turgor normal. No rashes or lesions Lymph nodes: cervical, supraclavicular, and axillary nodes normal. Neurologic: grossly normal  Pelvic: External genitalia:  no lesions              No abnormal inguinal nodes palpated.              Urethra:  normal appearing urethra with no masses, tenderness or lesions              Bartholins and Skenes: normal                 Vagina: normal appearing vagina with normal color and discharge, no lesions.  Atrophy noted.               Cervix:  absent              Pap taken: No. Bimanual Exam:  Uterus: absent.               Adnexa: no mass, fullness, tenderness              Rectal exam: Yes.  .  Confirms.              Anus:  normal sphincter tone, no lesions  Chaperone was present for exam.  Assessment:   Well woman visit with normal exam. Status post TVH/oophorectomy/anterior and posterior colporrhaphy. Vaginal atrophy.  Hyperlipidemia.  Anxiety.  Osteopenia. Dysuria.    Plan: Mammogram screening discussed. Self breast awareness reviewed. Pap and HR HPV as above. Guidelines for Calcium, Vitamin D, regular exercise program including cardiovascular and weight bearing exercise. Refill of Prozac for one year.  We talked about stress reduction.  Try Estroven with melatonin.  Water based lubricants, cooking oils, and vaginal estrogen discussed.  No Rx given. BMD next year.  Urine dip now.  UC if positive. Labs with PCP.  Follow up annually and prn.   After visit summary provided.

## 2019-07-24 ENCOUNTER — Telehealth: Payer: Self-pay | Admitting: Obstetrics and Gynecology

## 2019-07-24 ENCOUNTER — Ambulatory Visit (INDEPENDENT_AMBULATORY_CARE_PROVIDER_SITE_OTHER): Payer: Medicare Other | Admitting: Obstetrics and Gynecology

## 2019-07-24 ENCOUNTER — Encounter: Payer: Self-pay | Admitting: Obstetrics and Gynecology

## 2019-07-24 ENCOUNTER — Other Ambulatory Visit: Payer: Self-pay

## 2019-07-24 VITALS — BP 110/68 | HR 66 | Temp 97.2°F | Resp 14 | Ht 63.75 in | Wt 135.8 lb

## 2019-07-24 DIAGNOSIS — R300 Dysuria: Secondary | ICD-10-CM

## 2019-07-24 DIAGNOSIS — R8290 Unspecified abnormal findings in urine: Secondary | ICD-10-CM

## 2019-07-24 DIAGNOSIS — Z01419 Encounter for gynecological examination (general) (routine) without abnormal findings: Secondary | ICD-10-CM

## 2019-07-24 LAB — POCT URINALYSIS DIPSTICK
Bilirubin, UA: NEGATIVE
Blood, UA: NEGATIVE
Glucose, UA: NEGATIVE
Ketones, UA: NEGATIVE
Nitrite, UA: NEGATIVE
Protein, UA: NEGATIVE
Urobilinogen, UA: 0.2 E.U./dL
pH, UA: 5 (ref 5.0–8.0)

## 2019-07-24 MED ORDER — FLUOXETINE HCL 40 MG PO CAPS: ORAL_CAPSULE | ORAL | 3 refills | Status: DC

## 2019-07-24 MED ORDER — SULFAMETHOXAZOLE-TRIMETHOPRIM 800-160 MG PO TABS: 1.0000 | ORAL_TABLET | Freq: Two times a day (BID) | ORAL | 0 refills | Status: AC

## 2019-07-24 NOTE — Telephone Encounter (Signed)
I agree with starting Bactrim DS po bid x 3 days.

## 2019-07-24 NOTE — Telephone Encounter (Signed)
Call to patient. Message given to patient as seen below from Dr. Quincy Simmonds. Patient requesting to go ahead and treat possible bladder infection. Pharmacy confirmed as [facility] on [address]. RN advised would update Dr. Quincy Simmonds. Patient agreeable.   Medication for Bactrim DS #6, 0RF pended for Dr. Elza Rafter review and signature.

## 2019-07-24 NOTE — Telephone Encounter (Signed)
Prescription sent to pharmacy as written by provider.   Encounter closed.

## 2019-07-24 NOTE — Telephone Encounter (Signed)
Please contact patient in follow up to her urine dip today, done after her visit with me.  She had trace WBCs.  I am sending her urine for a culture.   She reported some urinary burning symptoms today.  If she would like treatment now for potential bladder infection, I can prescribe Bactrim DS po bid x 3 days.  If she wants to wait until her culture is back, that is ok also.

## 2019-07-24 NOTE — Patient Instructions (Signed)
Consider Estroven as an herbal treatment for hot flashes.    EXERCISE AND DIET:  We recommended that you start or continue a regular exercise program for good health. Regular exercise means any activity that makes your heart beat faster and makes you sweat.  We recommend exercising at least 30 minutes per day at least 3 days a week, preferably 4 or 5.  We also recommend a diet low in fat and sugar.  Inactivity, poor dietary choices and obesity can cause diabetes, heart attack, stroke, and kidney damage, among others.    ALCOHOL AND SMOKING:  Women should limit their alcohol intake to no more than 7 drinks/beers/glasses of wine (combined, not each!) per week. Moderation of alcohol intake to this level decreases your risk of breast cancer and liver damage. And of course, no recreational drugs are part of a healthy lifestyle.  And absolutely no smoking or even second hand smoke. Most people know smoking can cause heart and lung diseases, but did you know it also contributes to weakening of your bones? Aging of your skin?  Yellowing of your teeth and nails?  CALCIUM AND VITAMIN D:  Adequate intake of calcium and Vitamin D are recommended.  The recommendations for exact amounts of these supplements seem to change often, but generally speaking 600 mg of calcium (either carbonate or citrate) and 800 units of Vitamin D per day seems prudent. Certain women may benefit from higher intake of Vitamin D.  If you are among these women, your doctor will have told you during your visit.    PAP SMEARS:  Pap smears, to check for cervical cancer or precancers,  have traditionally been done yearly, although recent scientific advances have shown that most women can have pap smears less often.  However, every woman still should have a physical exam from her gynecologist every year. It will include a breast check, inspection of the vulva and vagina to check for abnormal growths or skin changes, a visual exam of the cervix, and  then an exam to evaluate the size and shape of the uterus and ovaries.  And after 68 years of age, a rectal exam is indicated to check for rectal cancers. We will also provide age appropriate advice regarding health maintenance, like when you should have certain vaccines, screening for sexually transmitted diseases, bone density testing, colonoscopy, mammograms, etc.   MAMMOGRAMS:  All women over 62 years old should have a yearly mammogram. Many facilities now offer a "3D" mammogram, which may cost around $50 extra out of pocket. If possible,  we recommend you accept the option to have the 3D mammogram performed.  It both reduces the number of women who will be called back for extra views which then turn out to be normal, and it is better than the routine mammogram at detecting truly abnormal areas.    COLONOSCOPY:  Colonoscopy to screen for colon cancer is recommended for all women at age 30.  We know, you hate the idea of the prep.  We agree, BUT, having colon cancer and not knowing it is worse!!  Colon cancer so often starts as a polyp that can be seen and removed at colonscopy, which can quite literally save your life!  And if your first colonoscopy is normal and you have no family history of colon cancer, most women don't have to have it again for 10 years.  Once every ten years, you can do something that may end up saving your life, right?  We will  be happy to help you get it scheduled when you are ready.  Be sure to check your insurance coverage so you understand how much it will cost.  It may be covered as a preventative service at no cost, but you should check your particular policy.

## 2019-07-26 LAB — URINE CULTURE

## 2019-08-12 ENCOUNTER — Other Ambulatory Visit: Payer: Self-pay | Admitting: Obstetrics and Gynecology

## 2019-08-12 DIAGNOSIS — Z1231 Encounter for screening mammogram for malignant neoplasm of breast: Secondary | ICD-10-CM

## 2019-09-23 ENCOUNTER — Encounter: Payer: Self-pay | Admitting: Internal Medicine

## 2019-09-23 ENCOUNTER — Other Ambulatory Visit: Payer: Self-pay

## 2019-09-23 ENCOUNTER — Ambulatory Visit (INDEPENDENT_AMBULATORY_CARE_PROVIDER_SITE_OTHER): Payer: Medicare Other | Admitting: Internal Medicine

## 2019-09-23 DIAGNOSIS — Z20828 Contact with and (suspected) exposure to other viral communicable diseases: Secondary | ICD-10-CM

## 2019-09-23 DIAGNOSIS — Z20822 Contact with and (suspected) exposure to covid-19: Secondary | ICD-10-CM

## 2019-09-23 NOTE — Progress Notes (Signed)
Virtual Visit via Video Note  I connected with Sneha Hashimoto on 09/23/19 at  3:40 PM EST by a video enabled telemedicine application and verified that I am speaking with the correct person using two identifiers.  The patient and the provider were at separate locations throughout the entire encounter.   I discussed the limitations of evaluation and management by telemedicine and the availability of in person appointments. The patient expressed understanding and agreed to proceed. The patient and the provider were the only parties present for the visit unless noted in HPI below.  History of Present Illness: The patient is a 68 y.o. female with visit for sore throat and sinus congestion. She is having some low grade fevers to close to 76F. Started 2 days ago. Has headache. She is also having some body aches. Denies chills. Overall it is worsening. Has tried aleve for the headache which helped. Denies cough or SOB. Not able to smell well due to nose clogged. Can taste normally.   Observations/Objective: Appearance: normal, breathing appears normal, casual grooming, abdomen does not appear distended, throat not examined, memory normal, mental status is A and O times 3  Assessment and Plan: See problem oriented charting  Follow Up Instructions: covid-19 test, start zyrtec over the counter  I discussed the assessment and treatment plan with the patient. The patient was provided an opportunity to ask questions and all were answered. The patient agreed with the plan and demonstrated an understanding of the instructions.   The patient was advised to call back or seek an in-person evaluation if the symptoms worsen or if the condition fails to improve as anticipated.  Hoyt Koch, MD

## 2019-09-24 ENCOUNTER — Ambulatory Visit: Payer: Medicare Other | Attending: Internal Medicine

## 2019-09-24 DIAGNOSIS — Z20822 Contact with and (suspected) exposure to covid-19: Secondary | ICD-10-CM | POA: Insufficient documentation

## 2019-09-24 DIAGNOSIS — U071 COVID-19: Secondary | ICD-10-CM

## 2019-09-24 NOTE — Assessment & Plan Note (Signed)
Symptoms mild at this time. Needs covid-19 test and quarantine until results return. We discussed if positive need to quarantine for 14 days. If negative will quarantine until feeling better.

## 2019-09-25 LAB — NOVEL CORONAVIRUS, NAA: SARS-CoV-2, NAA: NOT DETECTED

## 2019-10-02 ENCOUNTER — Ambulatory Visit: Payer: Medicare Other | Admitting: Internal Medicine

## 2019-10-07 ENCOUNTER — Ambulatory Visit
Admission: RE | Admit: 2019-10-07 | Discharge: 2019-10-07 | Disposition: A | Discharge status: Home / Self Care | Payer: Medicare Other | Source: Ambulatory Visit | Attending: Obstetrics and Gynecology | Admitting: Obstetrics and Gynecology

## 2019-10-07 ENCOUNTER — Other Ambulatory Visit: Payer: Self-pay

## 2019-10-07 DIAGNOSIS — Z1231 Encounter for screening mammogram for malignant neoplasm of breast: Secondary | ICD-10-CM

## 2019-10-07 NOTE — Progress Notes (Signed)
Subjective:    Patient ID: Marissa Lee, female    DOB: [birth year], 69 y.o.   MRN: [id number]  HPI She is here for a physical exam.     Medications and allergies reviewed with patient and updated if appropriate.  Patient Active Problem List   Diagnosis Date Noted  . Suspected COVID-19 virus infection 09/24/2019  . Hot flashes due to menopause 10/04/2018  . GERD (gastroesophageal reflux disease) 10/04/2018  . Abnormal urine odor 10/04/2018  . Subclinical hypothyroidism 10/04/2018  . Low back pain radiating to left lower extremity 04/26/2018  . Anxiety 09/29/2017  . Osteopenia 09/28/2016  . Hyperlipidemia 09/28/2016  . 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 Vivid dream 07/20/2015  . Constipation 09/06/2013  . Post-menopause on HRT (hormone replacement therapy) 06/05/2013    Current Outpatient Medications on File Prior to Visit  Medication Sig Dispense Refill  . Calcium-Phosphorus-Vitamin D (CITRACAL +D3 PO) Take by mouth daily.    Marland Kitchen Dextrose, Diabetic Use, (GLUCOSE PO) Take 1 tablet by mouth daily as needed. When feels like sugar is low    . ELDERBERRY PO Take 1 tablet by mouth daily.    Marland Kitchen FLUoxetine (PROZAC) 40 MG capsule TAKE ONE CAPSULE BY MOUTH EVERY DAY 90 capsule 3   No current facility-administered medications on file prior to visit.    Past Medical History:  Diagnosis Date  . Anxiety   . Endometriosis   . Fibrocystic breast    muliple drainage  . Fibroid   . Hiatal hernia   . History of bronchitis   . Hypercholesteremia 2007   currently not taking anything for it  . Osteopenia 08/2016   hip and spine  . Subclinical hypothyroidism 10/04/2018  . Urge incontinence 06/05/2013    Past Surgical History:  Procedure Laterality Date  . ABDOMINAL HYSTERECTOMY    . BLADDER SUSPENSION  2004   done with TVH  . BREAST BIOPSY  1980   done 3x  . CLOSED REDUCTION METACARPAL WITH PERCUTANEOUS PINNING Right 08/24/2016   Procedure: CLOSED REDUCTION  WITH PERCUTANEOUS  PINNING;  Surgeon: Iran Planas, MD;  Location: [place];  Service: Orthopedics;  Laterality: Right;  . COLONOSCOPY    . COMBINED HYSTERECTOMY VAGINAL / OOPHORECTOMY / A&P REPAIR  2004  . FOOT SURGERY Right 1992  . OPEN REDUCTION INTERNAL FIXATION (ORIF) HAND Right 08/24/2016   Procedure: RIGHT HAND LONG RING AND SMALL FINGER  POSSIBLE ORIF;  Surgeon: Iran Planas, MD;  Location: [place];  Service: Orthopedics;  Laterality: Right;  . WISDOM TOOTH EXTRACTION  1978    Social History   Socioeconomic History  . Marital status: Single    Spouse name: Not on file  . Number of children: Not on file  . Years of education: Not on file  . Highest education level: Not on file  Occupational History  . Not on file  Tobacco Use  . Smoking status: Former Smoker    Packs/day: 1.00    Years: 20.00    Pack years: 20.00    Types: Cigarettes  . Smokeless tobacco: Never Used  . Tobacco comment: quit in approximately 2000  Substance and Sexual Activity  . Alcohol use: Yes    Alcohol/week: 3.0 - 4.0 standard drinks    Types: 2 Cans of beer, 1 - 2 Standard drinks or equivalent per week    Comment: rarely  . Drug use: No  . Sexual activity: Never    Partners: Male    Birth control/protection: Surgical  Comment: Hyst  Other Topics Concern  . Not on file  Social History Narrative  . Not on file   Social Determinants of Health   Financial Resource Strain:   . Difficulty of Paying Living Expenses: Not on file  Food Insecurity:   . Worried About Charity fundraiser in the Last Year: Not on file  . Ran Out of Food in the Last Year: Not on file  Transportation Needs:   . Lack of Transportation (Medical): Not on file  . Lack of Transportation (Non-Medical): Not on file  Physical Activity:   . Days of Exercise per Week: Not on file  . Minutes of Exercise per Session: Not on file  Stress:   . Feeling of Stress : Not on file  Social Connections:   . Frequency of Communication with Friends and  Family: Not on file  . Frequency of Social Gatherings with Friends and Family: Not on file  . Attends Religious Services: Not on file  . Active Member of Clubs or Organizations: Not on file  . Attends Archivist Meetings: Not on file  . Marital Status: Not on file    Family History  Problem Relation Age of Onset  . Cancer Paternal Grandmother        Colon   . Cancer Paternal Grandfather        Lung  . Heart disease Mother        valvular disease, valve replacement  . Atrial fibrillation Mother   . Stroke Mother   . Heart disease Brother        valve disease, valve surgery  . Thyroid disease Maternal Grandmother        Goiter  . Thyroid disease Daughter        Hypothyroid    Review of Systems     Objective:  There were no vitals filed for this visit. There were no vitals filed for this visit. There is no height or weight on file to calculate BMI.  BP Readings from Last 3 Encounters:  07/24/19 110/68  10/04/18 134/80  08/10/18 124/78    Wt Readings from Last 3 Encounters:  07/24/19 135 lb 12.8 oz (61.6 kg)  10/04/18 143 lb 12.8 oz (65.2 kg)  08/10/18 147 lb (66.7 kg)     Physical Exam       Assessment & Plan:      This visit occurred during the SARS-CoV-2 public health emergency.  Safety protocols were in place, including screening questions prior to the visit, additional usage of staff PPE, and extensive cleaning of exam room while observing appropriate contact time as indicated for disinfecting solutions.       This encounter was created in error - please disregard.

## 2019-10-07 NOTE — Patient Instructions (Signed)
Blood work was ordered.     Medications reviewed and updated.  Changes include :     Your prescription(s) have been submitted to your pharmacy. Please take as directed and contact our office if you believe you are having problem(s) with the medication(s).  A referral was ordered for    Please followup in 6 months    Health Maintenance, Female Adopting a healthy lifestyle and getting preventive care are important in promoting health and wellness. Ask your health care provider about:  The right schedule for you to have regular tests and exams.  Things you can do on your own to prevent diseases and keep yourself healthy. What should I know about diet, weight, and exercise? Eat a healthy diet   Eat a diet that includes plenty of vegetables, fruits, low-fat dairy products, and lean protein.  Do not eat a lot of foods that are high in solid fats, added sugars, or sodium. Maintain a healthy weight Body mass index (BMI) is used to identify weight problems. It estimates body fat based on height and weight. Your health care provider can help determine your BMI and help you achieve or maintain a healthy weight. Get regular exercise Get regular exercise. This is one of the most important things you can do for your health. Most adults should:  Exercise for at least 150 minutes each week. The exercise should increase your heart rate and make you sweat (moderate-intensity exercise).  Do strengthening exercises at least twice a week. This is in addition to the moderate-intensity exercise.  Spend less time sitting. Even light physical activity can be beneficial. Watch cholesterol and blood lipids Have your blood tested for lipids and cholesterol at 68 years of age, then have this test every 5 years. Have your cholesterol levels checked more often if:  Your lipid or cholesterol levels are high.  You are older than 68 years of age.  You are at high risk for heart disease. What should I  know about cancer screening? Depending on your health history and family history, you may need to have cancer screening at various ages. This may include screening for:  Breast cancer.  Cervical cancer.  Colorectal cancer.  Skin cancer.  Lung cancer. What should I know about heart disease, diabetes, and high blood pressure? Blood pressure and heart disease  High blood pressure causes heart disease and increases the risk of stroke. This is more likely to develop in people who have high blood pressure readings, are of African descent, or are overweight.  Have your blood pressure checked: ? Every 3-5 years if you are 49-57 years of age. ? Every year if you are 70 years old or older. Diabetes Have regular diabetes screenings. This checks your fasting blood sugar level. Have the screening done:  Once every three years after age 34 if you are at a normal weight and have a low risk for diabetes.  More often and at a younger age if you are overweight or have a high risk for diabetes. What should I know about preventing infection? Hepatitis B If you have a higher risk for hepatitis B, you should be screened for this virus. Talk with your health care provider to find out if you are at risk for hepatitis B infection. Hepatitis C Testing is recommended for:  Everyone born from [birth year] through 1965.  Anyone with known risk factors for hepatitis C. Sexually transmitted infections (STIs)  Get screened for STIs, including gonorrhea and chlamydia, if: ? You are  sexually active and are younger than 68 years of age. ? You are older than 68 years of age and your health care provider tells you that you are at risk for this type of infection. ? Your sexual activity has changed since you were last screened, and you are at increased risk for chlamydia or gonorrhea. Ask your health care provider if you are at risk.  Ask your health care provider about whether you are at high risk for HIV. Your health  care provider may recommend a prescription medicine to help prevent HIV infection. If you choose to take medicine to prevent HIV, you should first get tested for HIV. You should then be tested every 3 months for as long as you are taking the medicine. Pregnancy  If you are about to stop having your period (premenopausal) and you may become pregnant, seek counseling before you get pregnant.  Take 400 to 800 micrograms (mcg) of folic acid every day if you become pregnant.  Ask for birth control (contraception) if you want to prevent pregnancy. Osteoporosis and menopause Osteoporosis is a disease in which the bones lose minerals and strength with aging. This can result in bone fractures. If you are 65 years old or older, or if you are at risk for osteoporosis and fractures, ask your health care provider if you should:  Be screened for bone loss.  Take a calcium or vitamin D supplement to lower your risk of fractures.  Be given hormone replacement therapy (HRT) to treat symptoms of menopause. Follow these instructions at home: Lifestyle  Do not use any products that contain nicotine or tobacco, such as cigarettes, e-cigarettes, and chewing tobacco. If you need help quitting, ask your health care provider.  Do not use street drugs.  Do not share needles.  Ask your health care provider for help if you need support or information about quitting drugs. Alcohol use  Do not drink alcohol if: ? Your health care provider tells you not to drink. ? You are pregnant, may be pregnant, or are planning to become pregnant.  If you drink alcohol: ? Limit how much you use to 0-1 drink a day. ? Limit intake if you are breastfeeding.  Be aware of how much alcohol is in your drink. In the [place] one drink equals one 12 oz bottle of beer (355 mL), one 5 oz glass of wine (148 mL), or one 1 oz glass of hard liquor (44 mL). General instructions  Schedule regular health, dental, and eye exams.  Stay  current with your vaccines.  Tell your health care provider if: ? You often feel depressed. ? You have ever been abused or do not feel safe at home. Summary  Adopting a healthy lifestyle and getting preventive care are important in promoting health and wellness.  Follow your health care provider's instructions about healthy diet, exercising, and getting tested or screened for diseases.  Follow your health care provider's instructions on monitoring your cholesterol and blood pressure. This information is not intended to replace advice given to you by your health care provider. Make sure you discuss any questions you have with your health care provider. Document Revised: 09/05/2018 Document Reviewed: 09/05/2018 Elsevier Patient Education  2020 [employer]  

## 2019-10-08 ENCOUNTER — Encounter: Payer: Medicare Other | Admitting: Internal Medicine

## 2019-10-14 ENCOUNTER — Encounter: Payer: Medicare Other | Admitting: Internal Medicine

## 2019-10-23 DIAGNOSIS — M75110 Incomplete rotator cuff tear or rupture of unspecified shoulder, not specified as traumatic: Secondary | ICD-10-CM | POA: Insufficient documentation

## 2019-10-23 DIAGNOSIS — M7502 Adhesive capsulitis of left shoulder: Secondary | ICD-10-CM | POA: Insufficient documentation

## 2019-10-23 DIAGNOSIS — M7542 Impingement syndrome of left shoulder: Secondary | ICD-10-CM | POA: Insufficient documentation

## 2019-10-29 ENCOUNTER — Encounter: Payer: Medicare Other | Admitting: Internal Medicine

## 2019-10-29 NOTE — Progress Notes (Signed)
Subjective:    Patient ID: Marissa Lee, female    DOB: 1951/03/05, 69 y.o.   MRN: [id number]  HPI She is here for a physical exam.   She is dealing with a shoulder injury.    She started taking vitamin C and vitamin D and has had constipation.    Medications and allergies reviewed with patient and updated if appropriate.  Patient Active Problem List   Diagnosis Date Noted  . Hot flashes due to menopause 10/04/2018  . GERD (gastroesophageal reflux disease) 10/04/2018  . Subclinical hypothyroidism 10/04/2018  . Low back pain radiating to left lower extremity 04/26/2018  . Anxiety 09/29/2017  . Osteopenia 09/28/2016  . Hyperlipidemia 09/28/2016  . 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 Vivid dream 07/20/2015  . Constipation 09/06/2013    Current Outpatient Medications on File Prior to Visit  Medication Sig Dispense Refill  . Calcium-Phosphorus-Vitamin D (CITRACAL +D3 PO) Take by mouth daily.    Marland Kitchen Dextrose, Diabetic Use, (GLUCOSE PO) Take 1 tablet by mouth daily as needed. When feels like sugar is low    . ELDERBERRY PO Take 1 tablet by mouth daily.    Marland Kitchen FLUoxetine (PROZAC) 40 MG capsule TAKE ONE CAPSULE BY MOUTH EVERY DAY 90 capsule 3   No current facility-administered medications on file prior to visit.    Past Medical History:  Diagnosis Date  . Anxiety   . Endometriosis   . Fibrocystic breast    muliple drainage  . Fibroid   . Hiatal hernia   . History of bronchitis   . Hypercholesteremia 2007   currently not taking anything for it  . Osteopenia 08/2016   hip and spine  . Subclinical hypothyroidism 10/04/2018  . Urge incontinence 06/05/2013    Past Surgical History:  Procedure Laterality Date  . ABDOMINAL HYSTERECTOMY    . BLADDER SUSPENSION  2004   done with TVH  . BREAST BIOPSY  1980   done 3x  . CLOSED REDUCTION METACARPAL WITH PERCUTANEOUS PINNING Right 08/24/2016   Procedure: CLOSED REDUCTION  WITH PERCUTANEOUS PINNING;  Surgeon: Iran Planas, MD;   Location: [facility];  Service: Orthopedics;  Laterality: Right;  . COLONOSCOPY    . COMBINED HYSTERECTOMY VAGINAL / OOPHORECTOMY / A&P REPAIR  2004  . FOOT SURGERY Right 1992  . OPEN REDUCTION INTERNAL FIXATION (ORIF) HAND Right 08/24/2016   Procedure: RIGHT HAND LONG RING AND SMALL FINGER  POSSIBLE ORIF;  Surgeon: Iran Planas, MD;  Location: [facility];  Service: Orthopedics;  Laterality: Right;  . WISDOM TOOTH EXTRACTION  1978    Social History   Socioeconomic History  . Marital status: Single    Spouse name: Not on file  . Number of children: Not on file  . Years of education: Not on file  . Highest education level: Not on file  Occupational History  . Not on file  Tobacco Use  . Smoking status: Former Smoker    Packs/day: 1.00    Years: 20.00    Pack years: 20.00    Types: Cigarettes  . Smokeless tobacco: Never Used  . Tobacco comment: quit in approximately 2000  Substance and Sexual Activity  . Alcohol use: Yes    Alcohol/week: 3.0 - 4.0 standard drinks    Types: 2 Cans of beer, 1 - 2 Standard drinks or equivalent per week    Comment: rarely  . Drug use: No  . Sexual activity: Never    Partners: Male    Birth control/protection: Surgical  Comment: Hyst  Other Topics Concern  . Not on file  Social History Narrative  . Not on file   Social Determinants of Health   Financial Resource Strain:   . Difficulty of Paying Living Expenses: Not on file  Food Insecurity:   . Worried About Charity fundraiser in the Last Year: Not on file  . Ran Out of Food in the Last Year: Not on file  Transportation Needs:   . Lack of Transportation (Medical): Not on file  . Lack of Transportation (Non-Medical): Not on file  Physical Activity:   . Days of Exercise per Week: Not on file  . Minutes of Exercise per Session: Not on file  Stress:   . Feeling of Stress : Not on file  Social Connections:   . Frequency of Communication with Friends and Family: Not on file  . Frequency of  Social Gatherings with Friends and Family: Not on file  . Attends Religious Services: Not on file  . Active Member of Clubs or Organizations: Not on file  . Attends Archivist Meetings: Not on file  . Marital Status: Not on file    Family History  Problem Relation Age of Onset  . Cancer Paternal Grandmother        Colon   . Cancer Paternal Grandfather        Lung  . Heart disease Mother        valvular disease, valve replacement  . Atrial fibrillation Mother   . Stroke Mother   . Heart disease Brother        valve disease, valve surgery  . Thyroid disease Maternal Grandmother        Goiter  . Thyroid disease Daughter        Hypothyroid    Review of Systems  Constitutional: Negative for chills and fever.  Eyes: Positive for visual disturbance (some blurriness at times).  Respiratory: Negative for cough, shortness of breath and wheezing.   Cardiovascular: Negative for chest pain, palpitations and leg swelling.  Gastrointestinal: Positive for constipation. Negative for abdominal pain, blood in stool, diarrhea and nausea.       No gerd  Genitourinary: Positive for dysuria (occ) and frequency. Negative for hematuria.  Musculoskeletal: Positive for back pain (intermittent). Negative for arthralgias.  Skin:       rosacea  Neurological: Positive for dizziness (occ) and light-headedness (occ). Negative for headaches.  Psychiatric/Behavioral: Negative for dysphoric mood. The patient is nervous/anxious (controlled).        Objective:   Vitals:   10/30/19 1003  BP: 136/82  Pulse: 70  Resp: 16  Temp: 98.2 F (36.8 C)  SpO2: 98%   Filed Weights   10/30/19 1003  Weight: 138 lb (62.6 kg)   Body mass index is 23.87 kg/m.  BP Readings from Last 3 Encounters:  10/30/19 136/82  07/24/19 110/68  10/04/18 134/80    Wt Readings from Last 3 Encounters:  10/30/19 138 lb (62.6 kg)  07/24/19 135 lb 12.8 oz (61.6 kg)  10/04/18 143 lb 12.8 oz (65.2 kg)     Physical  Exam Constitutional: She appears well-developed and well-nourished. No distress.  HENT:  Head: Normocephalic and atraumatic.  Right Ear: External ear normal. Normal ear canal and TM Left Ear: External ear normal.  Normal ear canal and TM Mouth/Throat: Oropharynx is clear and moist.  Eyes: Conjunctivae and EOM are normal.  Neck: Neck supple. No tracheal deviation present. No thyromegaly present.  No carotid bruit  Cardiovascular: Normal rate, regular rhythm and normal heart sounds.   No murmur heard.  No edema. Pulmonary/Chest: Effort normal and breath sounds normal. No respiratory distress. She has no wheezes. She has no rales.  Breast: deferred   Abdominal: Soft. She exhibits no distension. There is no tenderness.  Lymphadenopathy: She has no cervical adenopathy.  Skin: Skin is warm and dry. She is not diaphoretic.  Psychiatric: She has a normal mood and affect. Her behavior is normal.        Assessment & Plan:   Physical exam: Screening blood work    ordered Immunizations  discussed pneumovax - deferred until after covid, shingrix, covid Colonoscopy  Due - will schedule Mammogram  Up to date  Gyn  Up to date  Dexa  Up to date  Eye exams  Not up to date - will schedule Exercise  Active, no regular exercise Weight  Normal BMI Substance abuse  none  See Problem List for Assessment and Plan of chronic medical problems.     This visit occurred during the SARS-CoV-2 public health emergency.  Safety protocols were in place, including screening questions prior to the visit, additional usage of staff PPE, and extensive cleaning of exam room while observing appropriate contact time as indicated for disinfecting solutions.

## 2019-10-29 NOTE — Patient Instructions (Addendum)
Blood work was ordered.    All other Health Maintenance issues reviewed.   All recommended immunizations and age-appropriate screenings are up-to-date or discussed.  No immunization administered today.   Medications reviewed and updated.  Changes include :   none  Your prescription(s) have been submitted to your pharmacy. Please take as directed and contact our office if you believe you are having problem(s) with the medication(s).   A referral was ordered for urology  Please followup in 1 year    Health Maintenance, Female Adopting a healthy lifestyle and getting preventive care are important in promoting health and wellness. Ask your health care provider about:  The right schedule for you to have regular tests and exams.  Things you can do on your own to prevent diseases and keep yourself healthy. What should I know about diet, weight, and exercise? Eat a healthy diet   Eat a diet that includes plenty of vegetables, fruits, low-fat dairy products, and lean protein.  Do not eat a lot of foods that are high in solid fats, added sugars, or sodium. Maintain a healthy weight Body mass index (BMI) is used to identify weight problems. It estimates body fat based on height and weight. Your health care provider can help determine your BMI and help you achieve or maintain a healthy weight. Get regular exercise Get regular exercise. This is one of the most important things you can do for your health. Most adults should:  Exercise for at least 150 minutes each week. The exercise should increase your heart rate and make you sweat (moderate-intensity exercise).  Do strengthening exercises at least twice a week. This is in addition to the moderate-intensity exercise.  Spend less time sitting. Even light physical activity can be beneficial. Watch cholesterol and blood lipids Have your blood tested for lipids and cholesterol at 68 years of age, then have this test every 5 years. Have  your cholesterol levels checked more often if:  Your lipid or cholesterol levels are high.  You are older than 68 years of age.  You are at high risk for heart disease. What should I know about cancer screening? Depending on your health history and family history, you may need to have cancer screening at various ages. This may include screening for:  Breast cancer.  Cervical cancer.  Colorectal cancer.  Skin cancer.  Lung cancer. What should I know about heart disease, diabetes, and high blood pressure? Blood pressure and heart disease  High blood pressure causes heart disease and increases the risk of stroke. This is more likely to develop in people who have high blood pressure readings, are of African descent, or are overweight.  Have your blood pressure checked: ? Every 3-5 years if you are 75-72 years of age. ? Every year if you are 93 years old or older. Diabetes Have regular diabetes screenings. This checks your fasting blood sugar level. Have the screening done:  Once every three years after age 54 if you are at a normal weight and have a low risk for diabetes.  More often and at a younger age if you are overweight or have a high risk for diabetes. What should I know about preventing infection? Hepatitis B If you have a higher risk for hepatitis B, you should be screened for this virus. Talk with your health care provider to find out if you are at risk for hepatitis B infection. Hepatitis C Testing is recommended for:  Everyone born from [birth year] through 1965.  Anyone  with known risk factors for hepatitis C. Sexually transmitted infections (STIs)  Get screened for STIs, including gonorrhea and chlamydia, if: ? You are sexually active and are younger than 68 years of age. ? You are older than 68 years of age and your health care provider tells you that you are at risk for this type of infection. ? Your sexual activity has changed since you were last screened, and you  are at increased risk for chlamydia or gonorrhea. Ask your health care provider if you are at risk.  Ask your health care provider about whether you are at high risk for HIV. Your health care provider may recommend a prescription medicine to help prevent HIV infection. If you choose to take medicine to prevent HIV, you should first get tested for HIV. You should then be tested every 3 months for as long as you are taking the medicine. Pregnancy  If you are about to stop having your period (premenopausal) and you may become pregnant, seek counseling before you get pregnant.  Take 400 to 800 micrograms (mcg) of folic acid every day if you become pregnant.  Ask for birth control (contraception) if you want to prevent pregnancy. Osteoporosis and menopause Osteoporosis is a disease in which the bones lose minerals and strength with aging. This can result in bone fractures. If you are 29 years old or older, or if you are at risk for osteoporosis and fractures, ask your health care provider if you should:  Be screened for bone loss.  Take a calcium or vitamin D supplement to lower your risk of fractures.  Be given hormone replacement therapy (HRT) to treat symptoms of menopause. Follow these instructions at home: Lifestyle  Do not use any products that contain nicotine or tobacco, such as cigarettes, e-cigarettes, and chewing tobacco. If you need help quitting, ask your health care provider.  Do not use street drugs.  Do not share needles.  Ask your health care provider for help if you need support or information about quitting drugs. Alcohol use  Do not drink alcohol if: ? Your health care provider tells you not to drink. ? You are pregnant, may be pregnant, or are planning to become pregnant.  If you drink alcohol: ? Limit how much you use to 0-1 drink a day. ? Limit intake if you are breastfeeding.  Be aware of how much alcohol is in your drink. In the [place] one drink equals one 12  oz bottle of beer (355 mL), one 5 oz glass of wine (148 mL), or one 1 oz glass of hard liquor (44 mL). General instructions  Schedule regular health, dental, and eye exams.  Stay current with your vaccines.  Tell your health care provider if: ? You often feel depressed. ? You have ever been abused or do not feel safe at home. Summary  Adopting a healthy lifestyle and getting preventive care are important in promoting health and wellness.  Follow your health care provider's instructions about healthy diet, exercising, and getting tested or screened for diseases.  Follow your health care provider's instructions on monitoring your cholesterol and blood pressure. This information is not intended to replace advice given to you by your health care provider. Make sure you discuss any questions you have with your health care provider. Document Revised: 09/05/2018 Document Reviewed: 09/05/2018 Elsevier Patient Education  2020 [employer].

## 2019-10-30 ENCOUNTER — Encounter: Payer: Self-pay | Admitting: Internal Medicine

## 2019-10-30 ENCOUNTER — Ambulatory Visit (INDEPENDENT_AMBULATORY_CARE_PROVIDER_SITE_OTHER): Payer: Medicare Other | Admitting: Internal Medicine

## 2019-10-30 ENCOUNTER — Other Ambulatory Visit: Payer: Self-pay

## 2019-10-30 VITALS — BP 136/82 | HR 70 | Temp 98.2°F | Resp 16 | Ht 63.75 in | Wt 138.0 lb

## 2019-10-30 DIAGNOSIS — E039 Hypothyroidism, unspecified: Secondary | ICD-10-CM | POA: Diagnosis not present

## 2019-10-30 DIAGNOSIS — E038 Other specified hypothyroidism: Secondary | ICD-10-CM

## 2019-10-30 DIAGNOSIS — Z0000 Encounter for general adult medical examination without abnormal findings: Secondary | ICD-10-CM | POA: Diagnosis not present

## 2019-10-30 DIAGNOSIS — F419 Anxiety disorder, unspecified: Secondary | ICD-10-CM

## 2019-10-30 DIAGNOSIS — M8580 Other specified disorders of bone density and structure, unspecified site: Secondary | ICD-10-CM | POA: Diagnosis not present

## 2019-10-30 DIAGNOSIS — K449 Diaphragmatic hernia without obstruction or gangrene: Secondary | ICD-10-CM | POA: Insufficient documentation

## 2019-10-30 DIAGNOSIS — E7849 Other hyperlipidemia: Secondary | ICD-10-CM

## 2019-10-30 DIAGNOSIS — R350 Frequency of micturition: Secondary | ICD-10-CM

## 2019-10-30 LAB — LIPID PANEL
Cholesterol: 245 mg/dL — ABNORMAL HIGH (ref 0–200)
HDL: 53.7 mg/dL (ref >39.00)
LDL Cholesterol: 154 mg/dL — ABNORMAL HIGH (ref 0–99)
NonHDL: 191.02
Total CHOL/HDL Ratio: 5
Triglycerides: 187 mg/dL — ABNORMAL HIGH (ref 0.0–149.0)
VLDL: 37.4 mg/dL (ref 0.0–40.0)

## 2019-10-30 LAB — CBC WITH DIFFERENTIAL/PLATELET
Basophils Absolute: 0.1 10*3/uL (ref 0.0–0.1)
Basophils Relative: 1 % (ref 0.0–3.0)
Eosinophils Absolute: 0.1 10*3/uL (ref 0.0–0.7)
Eosinophils Relative: 1.6 % (ref 0.0–5.0)
HCT: 40 % (ref 36.0–46.0)
Hemoglobin: 13.4 g/dL (ref 12.0–15.0)
Lymphocytes Relative: 29.1 % (ref 12.0–46.0)
Lymphs Abs: 1.6 10*3/uL (ref 0.7–4.0)
MCHC: 33.5 g/dL (ref 30.0–36.0)
MCV: 95.4 fl (ref 78.0–100.0)
Monocytes Absolute: 0.6 10*3/uL (ref 0.1–1.0)
Monocytes Relative: 11.5 % (ref 3.0–12.0)
Neutro Abs: 3.2 10*3/uL (ref 1.4–7.7)
Neutrophils Relative %: 56.8 % (ref 43.0–77.0)
Platelets: 328 10*3/uL (ref 150.0–400.0)
RBC: 4.19 Mil/uL (ref 3.87–5.11)
RDW: 13.4 % (ref 11.5–15.5)
WBC: 5.6 10*3/uL (ref 4.0–10.5)

## 2019-10-30 LAB — COMPREHENSIVE METABOLIC PANEL
ALT: 16 U/L (ref 0–35)
AST: 19 U/L (ref 0–37)
Albumin: 4.1 g/dL (ref 3.5–5.2)
Alkaline Phosphatase: 53 U/L (ref 39–117)
BUN: 12 mg/dL (ref 6–23)
CO2: 30 mEq/L (ref 19–32)
Calcium: 9.1 mg/dL (ref 8.4–10.5)
Chloride: 104 mEq/L (ref 96–112)
Creatinine, Ser: 0.83 mg/dL (ref 0.40–1.20)
GFR: 68.53 mL/min (ref >60.00)
Glucose, Bld: 93 mg/dL (ref 70–99)
Potassium: 4.3 mEq/L (ref 3.5–5.1)
Sodium: 140 mEq/L (ref 135–145)
Total Bilirubin: 0.5 mg/dL (ref 0.2–1.2)
Total Protein: 6.7 g/dL (ref 6.0–8.3)

## 2019-10-30 NOTE — Assessment & Plan Note (Addendum)
Chronic Managed by gyn dexa up to date Taking vitamin d Active, but not exercising regularly

## 2019-10-30 NOTE — Assessment & Plan Note (Signed)
Had elevated tsh last year  Clinically euthyroid Check tsh with reflex

## 2019-10-30 NOTE — Assessment & Plan Note (Addendum)
Chronic Has not tolerated statins or zetia ( she thinks) Check lipids, tsh, cmp Advised low fat.chol diet and regular exercise If elevated will retry zetia first --  May need to go to lipid clinic for [facility] inhibitor

## 2019-10-30 NOTE — Assessment & Plan Note (Signed)
Chronic Managed by Gyn Controlled, stable Continue current dose of medication - prozac 40 mg daily

## 2019-10-31 LAB — REFLEX TIQ

## 2019-10-31 LAB — TSH(REFL): TSH: 3.07 mIU/L (ref 0.40–4.50)

## 2019-11-03 ENCOUNTER — Encounter: Payer: Self-pay | Admitting: Internal Medicine

## 2019-11-04 MED ORDER — EZETIMIBE 10 MG PO TABS: 10.0000 mg | ORAL_TABLET | Freq: Every day | ORAL | 3 refills | Status: DC

## 2020-07-01 ENCOUNTER — Other Ambulatory Visit: Payer: Medicare Other

## 2020-07-01 DIAGNOSIS — Z20822 Contact with and (suspected) exposure to covid-19: Secondary | ICD-10-CM

## 2020-07-02 LAB — NOVEL CORONAVIRUS, NAA: SARS-CoV-2, NAA: NOT DETECTED

## 2020-07-02 LAB — SARS-COV-2, NAA 2 DAY TAT

## 2020-07-02 LAB — SPECIMEN STATUS REPORT

## 2020-07-27 ENCOUNTER — Ambulatory Visit (INDEPENDENT_AMBULATORY_CARE_PROVIDER_SITE_OTHER): Payer: Medicare Other | Admitting: Obstetrics and Gynecology

## 2020-07-27 ENCOUNTER — Other Ambulatory Visit: Payer: Self-pay

## 2020-07-27 ENCOUNTER — Encounter: Payer: Self-pay | Admitting: Obstetrics and Gynecology

## 2020-07-27 VITALS — BP 152/80 | HR 63 | Ht 63.5 in | Wt 140.6 lb

## 2020-07-27 DIAGNOSIS — R350 Frequency of micturition: Secondary | ICD-10-CM | POA: Diagnosis not present

## 2020-07-27 DIAGNOSIS — Z01419 Encounter for gynecological examination (general) (routine) without abnormal findings: Secondary | ICD-10-CM | POA: Diagnosis not present

## 2020-07-27 MED ORDER — FLUOXETINE HCL 40 MG PO CAPS: ORAL_CAPSULE | ORAL | 3 refills | Status: DC

## 2020-07-27 NOTE — Progress Notes (Signed)
69 y.o. [id number] Widowed Caucasian female here for annual exam.    Patient saw her PCP earlier in the year.  Taking Prozac 40 mg daily.  This is helpful to her.   Having frequent urination.   No painful urination.  Saw urologist, Dr. Claudia Desanctis.  Not taking her medication.  Had a UTI in June and July, 2021.  Has constipation and fecal incontinence.  Takes Activia.   Patient's husband passed in 02/2020. He had liver and lung cancer.  Received Covid vaccination.  Completed her flu vaccine.   4 grandchildren.  Urine dip:  Negative.   PCP:  Billey Gosling, MD  Patient's last menstrual period was 09/27/2003.           Sexually active: No.  The current method of family planning is status post hysterectomy.    Exercising: Yes.    Plays with her grandchildren.  Smoker:  Former  Health Maintenance: Pap: years ago--normal History of abnormal Pap:  no MMG:  10-07-19 3D/Neg/density C/BiRads1 Colonoscopy:10-06-14 polyp;next 09/2019--Pt.knows she needs to schedule. BMD:   09-13-18  Result :Osteopenia of hip and spine TDaP:  06-30-16 Gardasil:   no HIV:06-30-16 NR Hep C: 06-30-16 Neg Screening Labs:  PCP.    reports that she has quit smoking. Her smoking use included cigarettes. She has a 20.00 pack-year smoking history. She has never used smokeless tobacco. She reports current alcohol use of about 3.0 - 4.0 standard drinks of alcohol per week. She reports that she does not use drugs.  Past Medical History:  Diagnosis Date  . Anxiety   . Endometriosis   . Fibrocystic breast    muliple drainage  . Fibroid   . Hiatal hernia   . History of bronchitis   . Hypercholesteremia 2007   currently not taking anything for it  . Osteopenia 08/2016   hip and spine  . Subclinical hypothyroidism 10/04/2018  . Urge incontinence 06/05/2013    Past Surgical History:  Procedure Laterality Date  . ABDOMINAL HYSTERECTOMY    . BLADDER SUSPENSION  2004   done with TVH  . BREAST BIOPSY  1980   done 3x   . CLOSED REDUCTION METACARPAL WITH PERCUTANEOUS PINNING Right 08/24/2016   Procedure: CLOSED REDUCTION  WITH PERCUTANEOUS PINNING;  Surgeon: Iran Planas, MD;  Location: [place];  Service: Orthopedics;  Laterality: Right;  . COLONOSCOPY    . COMBINED HYSTERECTOMY VAGINAL / OOPHORECTOMY / A&P REPAIR  2004  . FOOT SURGERY Right 1992  . OPEN REDUCTION INTERNAL FIXATION (ORIF) HAND Right 08/24/2016   Procedure: RIGHT HAND LONG RING AND SMALL FINGER  POSSIBLE ORIF;  Surgeon: Iran Planas, MD;  Location: [place];  Service: Orthopedics;  Laterality: Right;  . WISDOM TOOTH EXTRACTION  1978    Current Outpatient Medications  Medication Sig Dispense Refill  . Calcium-Phosphorus-Vitamin D (CITRACAL +D3 PO) Take by mouth daily.    Marland Kitchen Dextrose, Diabetic Use, (GLUCOSE PO) Take 1 tablet by mouth daily as needed. When feels like sugar is low    . ELDERBERRY PO Take 1 tablet by mouth daily.    Marland Kitchen ezetimibe (ZETIA) 10 MG tablet Take 1 tablet (10 mg total) by mouth daily. 90 tablet 3  . FLUoxetine (PROZAC) 40 MG capsule TAKE ONE CAPSULE BY MOUTH EVERY DAY 90 capsule 3   No current facility-administered medications for this visit.    Family History  Problem Relation Age of Onset  . Cancer Paternal Grandmother        Colon   .  Cancer Paternal Grandfather        Lung  . Heart disease Mother        valvular disease, valve replacement  . Atrial fibrillation Mother   . Stroke Mother   . Heart disease Brother        valve disease, valve surgery  . Thyroid disease Maternal Grandmother        Goiter  . Thyroid disease Daughter        Hypothyroid    Review of Systems  All other systems reviewed and are negative.   Exam:   BP (!) 152/80   Pulse 63   Ht 5' 3.5" (1.613 m)   Wt 140 lb 9.6 oz (63.8 kg)   LMP 09/27/2003   SpO2 94%   BMI 24.52 kg/m     General appearance: alert, cooperative and appears stated age Head: normocephalic, without obvious abnormality, atraumatic Neck: no adenopathy, supple,  symmetrical, trachea midline and thyroid normal to inspection and palpation Lungs: clear to auscultation bilaterally Breasts: normal appearance, no masses or tenderness, No nipple retraction or dimpling, No nipple discharge or bleeding, No axillary adenopathy Heart: regular rate and rhythm Abdomen: soft, non-tender; no masses, no organomegaly Extremities: extremities normal, atraumatic, no cyanosis or edema Skin: skin color, texture, turgor normal. No rashes or lesions Lymph nodes: cervical, supraclavicular, and axillary nodes normal. Neurologic: grossly normal  Pelvic: External genitalia:  no lesions              No abnormal inguinal nodes palpated.              Urethra:  normal appearing urethra with no masses, tenderness or lesions              Bartholins and Skenes: normal                 Vagina: normal appearing vagina with normal color and discharge, no lesions              Cervix: absent              Pap taken: No. Bimanual Exam:  Uterus:  absent              Adnexa: no mass, fullness, tenderness              Rectal exam: Yes.  .  Confirms.              Anus:  normal sphincter tone, no lesions  Chaperone was present for exam.  Assessment:   Well woman visit with normal exam. Status post TVH/oophorectomy/anterior and posterior colporrhaphy. Vaginal atrophy.  Urinary frequency.  Constipation and fecal incontinence.  Hyperlipidemia.  Anxiety.  Osteopenia.  Plan: Mammogram screening discussed. Self breast awareness reviewed. Pap and HR HPV as above. Guidelines for Calcium, Vitamin D, regular exercise program including cardiovascular and weight bearing exercise. Refill of Prozac 40 mg.  BMD in January, 2022.  Try Metamucil for constipation and fecal incontinence. She will contact her pharmacy to get back on her Zetia and contact her urologist to get a potential refill of her bladder medication.  We discussed Covid booster vaccination.  Follow up annually and prn.    After visit summary provided.

## 2020-07-27 NOTE — Patient Instructions (Addendum)
Please contact your pharmacy to get a refill on your Zetia.   Please contact Dr. Arnette Schaumann at [facility] to get a refill on your bladder medication to reduce urinary frequency.   Try Metamucil capsules twice a week to improve constipation and bowel incontinence.   [facility] to schedule both your mammogram and bone density for the same day in January, 2022.  This will need to be done after 10/07/19.  You are eligible for a Covid booster vaccine.  You may do this at your local pharmacy.    EXERCISE AND DIET:  We recommended that you start or continue a regular exercise program for good health. Regular exercise means any activity that makes your heart beat faster and makes you sweat.  We recommend exercising at least 30 minutes per day at least 3 days a week, preferably 4 or 5.  We also recommend a diet low in fat and sugar.  Inactivity, poor dietary choices and obesity can cause diabetes, heart attack, stroke, and kidney damage, among others.    ALCOHOL AND SMOKING:  Women should limit their alcohol intake to no more than 7 drinks/beers/glasses of wine (combined, not each!) per week. Moderation of alcohol intake to this level decreases your risk of breast cancer and liver damage. And of course, no recreational drugs are part of a healthy lifestyle.  And absolutely no smoking or even second hand smoke. Most people know smoking can cause heart and lung diseases, but did you know it also contributes to weakening of your bones? Aging of your skin?  Yellowing of your teeth and nails?  CALCIUM AND VITAMIN D:  Adequate intake of calcium and Vitamin D are recommended.  The recommendations for exact amounts of these supplements seem to change often, but generally speaking 600 mg of calcium (either carbonate or citrate) and 800 units of Vitamin D per day seems prudent. Certain women may benefit from higher intake of Vitamin D.  If you are among these women, your doctor will have  told you during your visit.    PAP SMEARS:  Pap smears, to check for cervical cancer or precancers,  have traditionally been done yearly, although recent scientific advances have shown that most women can have pap smears less often.  However, every woman still should have a physical exam from her gynecologist every year. It will include a breast check, inspection of the vulva and vagina to check for abnormal growths or skin changes, a visual exam of the cervix, and then an exam to evaluate the size and shape of the uterus and ovaries.  And after 68 years of age, a rectal exam is indicated to check for rectal cancers. We will also provide age appropriate advice regarding health maintenance, like when you should have certain vaccines, screening for sexually transmitted diseases, bone density testing, colonoscopy, mammograms, etc.   MAMMOGRAMS:  All women over 35 years old should have a yearly mammogram. Many facilities now offer a "3D" mammogram, which may cost around $50 extra out of pocket. If possible,  we recommend you accept the option to have the 3D mammogram performed.  It both reduces the number of women who will be called back for extra views which then turn out to be normal, and it is better than the routine mammogram at detecting truly abnormal areas.    COLONOSCOPY:  Colonoscopy to screen for colon cancer is recommended for all women at age 33.  We know, you hate the idea  of the prep.  We agree, BUT, having colon cancer and not knowing it is worse!!  Colon cancer so often starts as a polyp that can be seen and removed at colonscopy, which can quite literally save your life!  And if your first colonoscopy is normal and you have no family history of colon cancer, most women don't have to have it again for 10 years.  Once every ten years, you can do something that may end up saving your life, right?  We will be happy to help you get it scheduled when you are ready.  Be sure to check your insurance  coverage so you understand how much it will cost.  It may be covered as a preventative service at no cost, but you should check your particular policy.

## 2020-09-02 ENCOUNTER — Ambulatory Visit: Payer: Medicare Other | Admitting: Obstetrics and Gynecology

## 2020-12-28 ENCOUNTER — Telehealth: Payer: Self-pay

## 2020-12-28 ENCOUNTER — Other Ambulatory Visit: Payer: Self-pay

## 2020-12-28 DIAGNOSIS — M85859 Other specified disorders of bone density and structure, unspecified thigh: Secondary | ICD-10-CM

## 2020-12-28 DIAGNOSIS — M8588 Other specified disorders of bone density and structure, other site: Secondary | ICD-10-CM

## 2020-12-28 NOTE — Telephone Encounter (Signed)
Patient is wanting to schedule her DEXA scan at same time as her screening mammogram at [facility] but was told she will need order in prior to scheduling.  Per Dr. Elza Rafter result note on her 2019 Dexa result she is to repeat in 2 years. Order placed.

## 2021-01-18 ENCOUNTER — Other Ambulatory Visit: Payer: Self-pay | Admitting: Obstetrics and Gynecology

## 2021-01-18 DIAGNOSIS — Z1231 Encounter for screening mammogram for malignant neoplasm of breast: Secondary | ICD-10-CM

## 2021-02-12 ENCOUNTER — Other Ambulatory Visit: Payer: Medicare Other

## 2021-03-09 ENCOUNTER — Other Ambulatory Visit: Payer: Self-pay

## 2021-03-09 ENCOUNTER — Ambulatory Visit
Admission: RE | Admit: 2021-03-09 | Discharge: 2021-03-09 | Disposition: A | Discharge status: Home / Self Care | Payer: Medicare Other | Source: Ambulatory Visit | Attending: Obstetrics and Gynecology | Admitting: Obstetrics and Gynecology

## 2021-03-09 DIAGNOSIS — Z1231 Encounter for screening mammogram for malignant neoplasm of breast: Secondary | ICD-10-CM

## 2021-06-15 ENCOUNTER — Other Ambulatory Visit: Payer: Self-pay

## 2021-06-15 ENCOUNTER — Ambulatory Visit
Admission: RE | Admit: 2021-06-15 | Discharge: 2021-06-15 | Disposition: A | Discharge status: Home / Self Care | Payer: Medicare Other | Source: Ambulatory Visit | Attending: Obstetrics and Gynecology | Admitting: Obstetrics and Gynecology

## 2021-06-15 DIAGNOSIS — M85859 Other specified disorders of bone density and structure, unspecified thigh: Secondary | ICD-10-CM

## 2021-06-15 DIAGNOSIS — M8588 Other specified disorders of bone density and structure, other site: Secondary | ICD-10-CM

## 2021-07-02 ENCOUNTER — Other Ambulatory Visit: Payer: Medicare Other

## 2021-07-27 ENCOUNTER — Emergency Department (HOSPITAL_COMMUNITY): Payer: Medicare Other

## 2021-07-27 ENCOUNTER — Other Ambulatory Visit: Payer: Self-pay

## 2021-07-27 ENCOUNTER — Telehealth: Payer: Self-pay | Admitting: *Deleted

## 2021-07-27 ENCOUNTER — Inpatient Hospital Stay (HOSPITAL_COMMUNITY)
Admission: EM | Admit: 2021-07-27 | Discharge: 2021-08-03 | DRG: 233 | Disposition: A | Discharge status: Home Health | Payer: Medicare Other | Attending: Thoracic Surgery (Cardiothoracic Vascular Surgery) | Admitting: Thoracic Surgery (Cardiothoracic Vascular Surgery)

## 2021-07-27 DIAGNOSIS — Z23 Encounter for immunization: Secondary | ICD-10-CM | POA: Diagnosis present

## 2021-07-27 DIAGNOSIS — Z888 Allergy status to other drugs, medicaments and biological substances status: Secondary | ICD-10-CM

## 2021-07-27 DIAGNOSIS — I200 Unstable angina: Secondary | ICD-10-CM | POA: Diagnosis present

## 2021-07-27 DIAGNOSIS — F419 Anxiety disorder, unspecified: Secondary | ICD-10-CM | POA: Diagnosis present

## 2021-07-27 DIAGNOSIS — I2510 Atherosclerotic heart disease of native coronary artery without angina pectoris: Secondary | ICD-10-CM | POA: Diagnosis not present

## 2021-07-27 DIAGNOSIS — Z87891 Personal history of nicotine dependence: Secondary | ICD-10-CM | POA: Diagnosis not present

## 2021-07-27 DIAGNOSIS — Z79899 Other long term (current) drug therapy: Secondary | ICD-10-CM | POA: Diagnosis not present

## 2021-07-27 DIAGNOSIS — J90 Pleural effusion, not elsewhere classified: Secondary | ICD-10-CM | POA: Diagnosis not present

## 2021-07-27 DIAGNOSIS — Z20822 Contact with and (suspected) exposure to covid-19: Secondary | ICD-10-CM | POA: Diagnosis present

## 2021-07-27 DIAGNOSIS — I214 Non-ST elevation (NSTEMI) myocardial infarction: Secondary | ICD-10-CM

## 2021-07-27 DIAGNOSIS — Z881 Allergy status to other antibiotic agents status: Secondary | ICD-10-CM | POA: Diagnosis not present

## 2021-07-27 DIAGNOSIS — Z8249 Family history of ischemic heart disease and other diseases of the circulatory system: Secondary | ICD-10-CM | POA: Diagnosis not present

## 2021-07-27 DIAGNOSIS — E8770 Fluid overload, unspecified: Secondary | ICD-10-CM | POA: Diagnosis not present

## 2021-07-27 DIAGNOSIS — I25110 Atherosclerotic heart disease of native coronary artery with unstable angina pectoris: Principal | ICD-10-CM | POA: Diagnosis present

## 2021-07-27 DIAGNOSIS — E038 Other specified hypothyroidism: Secondary | ICD-10-CM | POA: Diagnosis present

## 2021-07-27 DIAGNOSIS — Z885 Allergy status to narcotic agent status: Secondary | ICD-10-CM | POA: Diagnosis not present

## 2021-07-27 DIAGNOSIS — D62 Acute posthemorrhagic anemia: Secondary | ICD-10-CM | POA: Diagnosis not present

## 2021-07-27 DIAGNOSIS — Z8616 Personal history of COVID-19: Secondary | ICD-10-CM

## 2021-07-27 DIAGNOSIS — E7800 Pure hypercholesterolemia, unspecified: Secondary | ICD-10-CM | POA: Diagnosis present

## 2021-07-27 DIAGNOSIS — J9602 Acute respiratory failure with hypercapnia: Secondary | ICD-10-CM | POA: Diagnosis not present

## 2021-07-27 DIAGNOSIS — J9811 Atelectasis: Secondary | ICD-10-CM | POA: Diagnosis not present

## 2021-07-27 DIAGNOSIS — Z951 Presence of aortocoronary bypass graft: Secondary | ICD-10-CM | POA: Diagnosis not present

## 2021-07-27 DIAGNOSIS — E785 Hyperlipidemia, unspecified: Secondary | ICD-10-CM | POA: Diagnosis not present

## 2021-07-27 DIAGNOSIS — R079 Chest pain, unspecified: Secondary | ICD-10-CM | POA: Diagnosis not present

## 2021-07-27 LAB — RESP PANEL BY RT-PCR (FLU A&B, COVID) ARPGX2
Influenza A by PCR: NEGATIVE
Influenza B by PCR: NEGATIVE
SARS Coronavirus 2 by RT PCR: POSITIVE — AB

## 2021-07-27 LAB — BASIC METABOLIC PANEL
Anion gap: 7 (ref 5–15)
BUN: 9 mg/dL (ref 8–23)
CO2: 27 mmol/L (ref 22–32)
Calcium: 9 mg/dL (ref 8.9–10.3)
Chloride: 105 mmol/L (ref 98–111)
Creatinine, Ser: 0.96 mg/dL (ref 0.44–1.00)
GFR, Estimated: >60 mL/min (ref >60)
Glucose, Bld: 107 mg/dL — ABNORMAL HIGH (ref 70–99)
Potassium: 3.6 mmol/L (ref 3.5–5.1)
Sodium: 139 mmol/L (ref 135–145)

## 2021-07-27 LAB — CBC
HCT: 43.6 % (ref 36.0–46.0)
Hemoglobin: 14.6 g/dL (ref 12.0–15.0)
MCH: 32.2 pg (ref 26.0–34.0)
MCHC: 33.5 g/dL (ref 30.0–36.0)
MCV: 96 fL (ref 80.0–100.0)
Platelets: 320 10*3/uL (ref 150–400)
RBC: 4.54 MIL/uL (ref 3.87–5.11)
RDW: 12.8 % (ref 11.5–15.5)
WBC: 5.9 10*3/uL (ref 4.0–10.5)
nRBC: 0 % (ref 0.0–0.2)

## 2021-07-27 LAB — TSH: TSH: 3.643 u[IU]/mL (ref 0.350–4.500)

## 2021-07-27 LAB — MAGNESIUM: Magnesium: 2 mg/dL (ref 1.7–2.4)

## 2021-07-27 LAB — TROPONIN I (HIGH SENSITIVITY)
Troponin I (High Sensitivity): 14 ng/L (ref <18)
Troponin I (High Sensitivity): 14 ng/L (ref <18)

## 2021-07-27 LAB — HIV ANTIBODY (ROUTINE TESTING W REFLEX): HIV Screen 4th Generation wRfx: NONREACTIVE

## 2021-07-27 LAB — T4, FREE: Free T4: 0.93 ng/dL (ref 0.61–1.12)

## 2021-07-27 MED ORDER — SODIUM CHLORIDE 0.9 % IV SOLN: INTRAVENOUS | Status: DC

## 2021-07-27 MED ORDER — HEPARIN BOLUS VIA INFUSION
3800.0000 [IU] | Freq: Once | INTRAVENOUS | Status: AC
  Administered 2021-07-27: 3800 [IU] via INTRAVENOUS
  Filled 2021-07-27: qty 3800

## 2021-07-27 MED ORDER — ASPIRIN 81 MG PO CHEW: 324.0000 mg | CHEWABLE_TABLET | ORAL | Status: DC

## 2021-07-27 MED ORDER — ATORVASTATIN CALCIUM 80 MG PO TABS
80.0000 mg | ORAL_TABLET | Freq: Every day | ORAL | Status: DC
  Administered 2021-07-27 – 2021-07-31 (×5): 80 mg via ORAL
  Filled 2021-07-27 (×3): qty 1
  Filled 2021-07-27 (×2): qty 2

## 2021-07-27 MED ORDER — EZETIMIBE 10 MG PO TABS
10.0000 mg | ORAL_TABLET | Freq: Every day | ORAL | Status: DC
  Administered 2021-07-28 – 2021-08-03 (×6): 10 mg via ORAL
  Filled 2021-07-27 (×7): qty 1

## 2021-07-27 MED ORDER — ASPIRIN EC 81 MG PO TBEC
81.0000 mg | DELAYED_RELEASE_TABLET | Freq: Every day | ORAL | Status: DC
  Administered 2021-07-28: 81 mg via ORAL
  Filled 2021-07-27: qty 1

## 2021-07-27 MED ORDER — HEPARIN (PORCINE) 25000 UT/250ML-% IV SOLN
800.0000 [IU]/h | INTRAVENOUS | Status: DC
  Administered 2021-07-27: 750 [IU]/h via INTRAVENOUS
  Filled 2021-07-27: qty 250

## 2021-07-27 MED ORDER — ASPIRIN 81 MG PO CHEW
324.0000 mg | CHEWABLE_TABLET | Freq: Once | ORAL | Status: AC
  Administered 2021-07-27: 324 mg via ORAL
  Filled 2021-07-27: qty 4

## 2021-07-27 MED ORDER — NITROGLYCERIN 0.4 MG SL SUBL: 0.4000 mg | SUBLINGUAL_TABLET | SUBLINGUAL | Status: DC | PRN

## 2021-07-27 MED ORDER — ALBUTEROL SULFATE HFA 108 (90 BASE) MCG/ACT IN AERS
2.0000 | INHALATION_SPRAY | Freq: Four times a day (QID) | RESPIRATORY_TRACT | Status: DC | PRN
  Filled 2021-07-27: qty 6.7

## 2021-07-27 MED ORDER — ASPIRIN 300 MG RE SUPP: 300.0000 mg | RECTAL | Status: DC

## 2021-07-27 MED ORDER — ACETAMINOPHEN 325 MG PO TABS: 650.0000 mg | ORAL_TABLET | ORAL | Status: DC | PRN

## 2021-07-27 MED ORDER — FLUOXETINE HCL 20 MG PO CAPS
40.0000 mg | ORAL_CAPSULE | Freq: Every day | ORAL | Status: DC
  Administered 2021-07-28 – 2021-08-02 (×6): 40 mg via ORAL
  Filled 2021-07-27 (×6): qty 2

## 2021-07-27 MED ORDER — SODIUM CHLORIDE 0.9% FLUSH
3.0000 mL | Freq: Two times a day (BID) | INTRAVENOUS | Status: DC
  Administered 2021-07-28: 3 mL via INTRAVENOUS

## 2021-07-27 MED ORDER — ZOLPIDEM TARTRATE 5 MG PO TABS: 5.0000 mg | ORAL_TABLET | Freq: Every evening | ORAL | Status: DC | PRN

## 2021-07-27 MED ORDER — ONDANSETRON HCL 4 MG/2ML IJ SOLN: 4.0000 mg | Freq: Four times a day (QID) | INTRAMUSCULAR | Status: DC | PRN

## 2021-07-27 NOTE — Telephone Encounter (Signed)
Patient called with complaints of chest pains that comes and goes. Severity 10. Pt requesting to be referred to a cardiologist. I advised pt that until referral is placed and she is seen by a cardiologist, if she has anymore chest pain to be seen by a urgent care,or ED. I will route to Provider for advice and approval for referral.

## 2021-07-27 NOTE — ED Notes (Signed)
Patient transported to X-ray 

## 2021-07-27 NOTE — H&P (Signed)
Physician History and Physical     Patient ID: Marissa Lee MRN: [id number] DOB/AGE: 1951-01-29 70 y.o. Admit date: 07/27/2021  Primary Care Physician: Binnie Rail, MD Primary Cardiologist: New  Active Problems:   Unstable angina [facility])   HPI:  70 y.o. seen in ER for new onset SSCP over the past week. SSCP with exertion better with rest Radiation to back/arm/jaw. She has been under some stress and has some anxiety. She was at beach in [place] and flew to [place] and has been trying to care for grand kids. She has had recurrent pains last 48 hours longest lasting an hour Pain free now. ECG in ER with very labile inferior changes Initially had marked T wave inversions in 2,3,F and have now normalized when pain free. She has no pleurisy dyspnea calf pain or LE edema despite recent travel  She had covid beginning of month mild with cough and no Rx has had vacccines and booster   She is retired Music therapist and came to [facility] from [place] to be near daughter and grand kids   Review of systems complete and found to be negative unless listed above   Past Medical History:  Diagnosis Date   Anxiety    Endometriosis    Fibrocystic breast    muliple drainage   Fibroid    Hiatal hernia    History of bronchitis    Hypercholesteremia 2007   currently not taking anything for it   Osteopenia 08/2016   hip and spine   Subclinical hypothyroidism 10/04/2018   Urge incontinence 06/05/2013    Family History  Problem Relation Age of Onset   Cancer Paternal Grandmother        Colon    Cancer Paternal Grandfather        Lung   Heart disease Mother        valvular disease, valve replacement   Atrial fibrillation Mother    Stroke Mother    Heart disease Brother        valve disease, valve surgery   Thyroid disease Maternal Grandmother        Goiter   Thyroid disease Daughter        Hypothyroid    Social History   Socioeconomic History   Marital status: Single    Spouse name: Not on file    Number of children: Not on file   Years of education: Not on file   Highest education level: Not on file  Occupational History   Not on file  Tobacco Use   Smoking status: Former    Packs/day: 1.00    Years: 20.00    Pack years: 20.00    Types: Cigarettes   Smokeless tobacco: Never   Tobacco comments:    quit in approximately 2000  Vaping Use   Vaping Use: Never used  Substance and Sexual Activity   Alcohol use: Yes    Alcohol/week: 3.0 - 4.0 standard drinks    Types: 2 Cans of beer, 1 - 2 Standard drinks or equivalent per week    Comment: rarely   Drug use: No   Sexual activity: Never    Partners: Male    Birth control/protection: Surgical    Comment: Hyst  Other Topics Concern   Not on file  Social History Narrative   Not on file   Social Determinants of Health   Financial Resource Strain: Not on file  Food Insecurity: Not on file  Transportation Needs: Not on file  Physical Activity:  Not on file  Stress: Not on file  Social Connections: Not on file  Intimate Partner Violence: Not on file    Past Surgical History:  Procedure Laterality Date   ABDOMINAL HYSTERECTOMY     BLADDER SUSPENSION  2004   done with [place]   done 3x   CLOSED REDUCTION METACARPAL WITH PERCUTANEOUS PINNING Right 08/24/2016   Procedure: CLOSED REDUCTION  WITH PERCUTANEOUS PINNING;  Surgeon: Iran Planas, MD;  Location: [place];  Service: Orthopedics;  Laterality: Right;   COLONOSCOPY     COMBINED HYSTERECTOMY VAGINAL / OOPHORECTOMY / A&P REPAIR  2004   FOOT SURGERY Right 1992   OPEN REDUCTION INTERNAL FIXATION (ORIF) HAND Right 08/24/2016   Procedure: RIGHT HAND LONG RING AND SMALL FINGER  POSSIBLE ORIF;  Surgeon: Iran Planas, MD;  Location: [place];  Service: Orthopedics;  Laterality: Right;   [place]     (Not in a hospital admission)   Physical Exam: Blood pressure (!) 149/58, pulse 66, temperature 98.7 F (37.1 C), resp. rate 13, height 5'  4" (1.626 m), weight 63.5 kg, last menstrual period 09/27/2003, SpO2 99 %.     Affect appropriate Healthy:  appears stated age 70: normal Neck supple with no adenopathy JVP normal no bruits no thyromegaly Lungs clear with no wheezing and good diaphragmatic motion Heart:  S1/S2 no murmur, no rub, gallop or click PMI normal Abdomen: benighn, BS positve, no tenderness, no AAA no bruit.  No HSM or HJR Distal pulses intact with no bruits No edema Neuro non-focal Skin warm and dry No muscular weakness  No current facility-administered medications on file prior to encounter.   Current Outpatient Medications on File Prior to Encounter  Medication Sig Dispense Refill   albuterol (VENTOLIN HFA) 108 (90 Base) MCG/ACT inhaler Inhale 2 puffs into the lungs every 6 (six) hours as needed.     Calcium-Phosphorus-Vitamin D (CITRACAL +D3 PO) Take by mouth daily.     Dextrose, Diabetic Use, (GLUCOSE PO) Take 1 tablet by mouth daily as needed. When feels like sugar is low     doxycycline (PERIOSTAT) 20 MG tablet Take 20 mg by mouth 2 (two) times daily.     ELDERBERRY PO Take 1 tablet by mouth daily.     ezetimibe (ZETIA) 10 MG tablet Take 1 tablet (10 mg total) by mouth daily. 90 tablet 3   FLUoxetine (PROZAC) 40 MG capsule TAKE ONE CAPSULE BY MOUTH EVERY DAY 90 capsule 3   gabapentin (NEURONTIN) 100 MG capsule Take 100 mg by mouth 3 (three) times daily as needed.     meloxicam (MOBIC) 7.5 MG tablet Take 7.5 mg by mouth daily as needed.      Labs:   Lab Results  Component Value Date   WBC 5.9 07/27/2021   HGB 14.6 07/27/2021   HCT 43.6 07/27/2021   MCV 96.0 07/27/2021   PLT 320 07/27/2021    Recent Labs  Lab 07/27/21 1720  NA 139  K 3.6  CL 105  CO2 27  BUN 9  CREATININE 0.96  CALCIUM 9.0  GLUCOSE 107*   Lab Results  Component Value Date   CKTOTAL 75 05/23/2018   TROPONINI <0.03 01/29/2016   TROPONINI <0.03 01/29/2016     Lab Results  Component Value Date   CHOL 245  (H) 10/30/2019   CHOL 308 (H) 10/04/2018   CHOL 216 (H) 02/01/2018   Lab Results  Component Value Date  HDL 53.70 10/30/2019   HDL 55.30 10/04/2018   HDL 56.70 02/01/2018   Lab Results  Component Value Date   LDLCALC 154 (H) 10/30/2019   LDLCALC 218 (H) 10/04/2018   LDLCALC 139 (H) 02/01/2018   Lab Results  Component Value Date   TRIG 187.0 (H) 10/30/2019   TRIG 171.0 (H) 10/04/2018   TRIG 104.0 02/01/2018   Lab Results  Component Value Date   CHOLHDL 5 10/30/2019   CHOLHDL 6 10/04/2018   CHOLHDL 4 02/01/2018   No results found for: LDLDIRECT     Radiology: DG Chest 2 View  Result Date: 07/27/2021 CLINICAL DATA:  Chest pain. EXAM: CHEST - 2 VIEW COMPARISON:  Chest radiograph dated 10/10/2016. FINDINGS: The heart size and mediastinal contours are within normal limits. Both lungs are clear. The visualized skeletal structures are unremarkable. IMPRESSION: No active cardiopulmonary disease. Electronically Signed   By: Anner Crete M.D.   On: 07/27/2021 19:09    EKG: NSR labile T wave inversions in inferior leads 2,3,F  ASSESSMENT AND PLAN:  Chest Pain: new onset had normal myovue and echo in 2017. Limited risk factors and despite pains troponins negative. Very concerning labile inferior T wave changes in 2,3,F in RCA distribution. Shared decision making feel diagnostic cath in am with echo best approach Risks discussed willing to proceed no dye allergy and good right radial pulse Coronary spasm is a possibility will start nitrates and heparin No beta blocker HR in 60's CXR with NAD    Signed: Collier Salina Nishan11/09/2020, 7:38 PM

## 2021-07-27 NOTE — Progress Notes (Signed)
ANTICOAGULATION CONSULT NOTE - Initial Consult  Pharmacy Consult for IV heparin Indication: chest pain/ACS  Allergies  Allergen Reactions   Cephalosporins Anaphylaxis and Swelling    Throat swells   Clindamycin/Lincomycin     Metallic taste, blisters behind ears   Codeine Itching    Patient Measurements: Height: 5\' 4"  (162.6 cm) Weight: 63.5 kg (140 lb) IBW/kg (Calculated) : 54.7 Heparin Dosing Weight: 63.5 kg  Vital Signs: Temp: 98.7 F (37.1 C) (11/01 1716) BP: 149/58 (11/01 1915) Pulse Rate: 66 (11/01 1915)  Labs: Recent Labs    07/27/21 1720  HGB 14.6  HCT 43.6  PLT 320  CREATININE 0.96  TROPONINIHS 14    Estimated Creatinine Clearance: 48.4 mL/min (by C-G formula based on SCr of 0.96 mg/dL).   Medical History: Past Medical History:  Diagnosis Date   Anxiety    Endometriosis    Fibrocystic breast    muliple drainage   Fibroid    Hiatal hernia    History of bronchitis    Hypercholesteremia 2007   currently not taking anything for it   Osteopenia 08/2016   hip and spine   Subclinical hypothyroidism 10/04/2018   Urge incontinence 06/05/2013    Assessment: 70 yo F presents with chest pain x 1 day with no previous cardiac history. No PTA anticoagulation. Pharmacy consulted to start heparin IV.  Hgb 14.6, Plts 320  Goal of Therapy:  Heparin level 0.3-0.7 units/ml Monitor platelets by anticoagulation protocol: Yes   Plan:  Heparin Bolus 3800 units Start heparin IV 750 units/hr 6 hour heparin level Daily heparin level and CBC   Marissa Lee 07/27/2021,7:40 PM

## 2021-07-27 NOTE — ED Provider Notes (Signed)
[facility] Provider Note   CSN: [id number] Arrival date & time: 07/27/21  1544     History Chief Complaint  Patient presents with   Chest Pain    Marissa Lee is a 70 y.o. female w/ PMHx of anxiety, back pain, and HLD presenting to ED with 1 day of chest pain. Yesterday she experienced 3 episode of chest pain. 1) 3am with her grandchild crying, which woke her up. 2) 6am when she [place], she needed to take a seat before she could continue. 3) 4 pm when she was attempting to lift a pumpkin and throw it away. She does not have a cardiac history. There is a family hx of a-fib in mother and brother. She only takes fluoxetine for anxiety; which she initially thought these were anxiety attacks. She does not have a PCP and has not seen a doctor for 2-3 years now.   The history is provided by the patient.  Chest Pain Pain location:  Epigastric (upper chest, from shoulder to shoulder) Pain quality: pressure and radiating   Pain radiates to:  L jaw and R jaw Pain severity now: now 1-2, yesterday 8-9. Onset quality: with exertion. Duration:  15 minutes Timing:  Intermittent Progression since onset: increasing in frequency. Chronicity:  New Context: lifting and movement   Relieved by:  Rest Worsened by:  Exertion Associated symptoms: anxiety, back pain and shortness of breath   Associated symptoms: no abdominal pain, no cough, no diaphoresis, no dizziness, no fever, no lower extremity edema, no nausea, no palpitations, no syncope and no weakness   Risk factors: high cholesterol       Past Medical History:  Diagnosis Date   Anxiety    Endometriosis    Fibrocystic breast    muliple drainage   Fibroid    Hiatal hernia    History of bronchitis    Hypercholesteremia 2007   currently not taking anything for it   Osteopenia 08/2016   hip and spine   Subclinical hypothyroidism 10/04/2018   Urge incontinence  06/05/2013    Patient Active Problem List   Diagnosis Date Noted   Hiatal hernia 10/30/2019   Subclinical hypothyroidism 10/04/2018   Low back pain radiating to left lower extremity 04/26/2018   Anxiety 09/29/2017   Osteopenia 09/28/2016   Hyperlipidemia 09/28/2016   Hypersomnia, persistent 07/20/2015   Constipation 09/06/2013    Past Surgical History:  Procedure Laterality Date   ABDOMINAL HYSTERECTOMY     BLADDER SUSPENSION  2004   done with [place]   done 3x   CLOSED REDUCTION METACARPAL WITH PERCUTANEOUS PINNING Right 08/24/2016   Procedure: CLOSED REDUCTION  WITH PERCUTANEOUS PINNING;  Surgeon: Iran Planas, MD;  Location: [facility];  Service: Orthopedics;  Laterality: Right;   COLONOSCOPY     COMBINED HYSTERECTOMY VAGINAL / OOPHORECTOMY / A&P REPAIR  2004   FOOT SURGERY Right 1992   OPEN REDUCTION INTERNAL FIXATION (ORIF) HAND Right 08/24/2016   Procedure: RIGHT HAND LONG RING AND SMALL FINGER  POSSIBLE ORIF;  Surgeon: Iran Planas, MD;  Location: [facility];  Service: Orthopedics;  Laterality: Right;   WISDOM TOOTH EXTRACTION  1978     OB History     Gravida  5   Para  2   Term  2   Preterm      AB  3   Living  2      SAB  3   IAB      Ectopic      Multiple      Live Births  2           Family History  Problem Relation Age of Onset   Cancer Paternal Grandmother        Colon    Cancer Paternal Grandfather        Lung   Heart disease Mother        valvular disease, valve replacement   Atrial fibrillation Mother    Stroke Mother    Heart disease Brother        valve disease, valve surgery   Thyroid disease Maternal Grandmother        Goiter   Thyroid disease Daughter        Hypothyroid    Social History   Tobacco Use   Smoking status: Former    Packs/day: 1.00    Years: 20.00    Pack years: 20.00    Types: Cigarettes   Smokeless tobacco: Never   Tobacco comments:    quit in approximately 2000  Vaping Use   Vaping  Use: Never used  Substance Use Topics   Alcohol use: Yes    Alcohol/week: 3.0 - 4.0 standard drinks    Types: 2 Cans of beer, 1 - 2 Standard drinks or equivalent per week    Comment: rarely   Drug use: No    Home Medications Prior to Admission medications   Medication Sig Start Date End Date Taking? Authorizing Provider  albuterol (VENTOLIN HFA) 108 (90 Base) MCG/ACT inhaler Inhale 2 puffs into the lungs every 6 (six) hours as needed. 06/30/21   [provider]  Calcium-Phosphorus-Vitamin D (CITRACAL +D3 PO) Take by mouth daily.    [provider]  Dextrose, Diabetic Use, (GLUCOSE PO) Take 1 tablet by mouth daily as needed. When feels like sugar is low    [provider]  doxycycline (PERIOSTAT) 20 MG tablet Take 20 mg by mouth 2 (two) times daily. 02/25/21   [provider]  ELDERBERRY PO Take 1 tablet by mouth daily.    [provider]  ezetimibe (ZETIA) 10 MG tablet Take 1 tablet (10 mg total) by mouth daily. 11/04/19   Binnie Rail, MD  FLUoxetine (PROZAC) 40 MG capsule TAKE ONE CAPSULE BY MOUTH EVERY DAY 07/27/20   Nunzio Cobbs, MD  gabapentin (NEURONTIN) 100 MG capsule Take 100 mg by mouth 3 (three) times daily as needed. 03/09/21   [provider]  meloxicam (MOBIC) 7.5 MG tablet Take 7.5 mg by mouth daily as needed. 03/16/21   [provider]    Allergies    Cephalosporins, Clindamycin/lincomycin, and Codeine  Review of Systems   Review of Systems  Constitutional:  Negative for activity change, appetite change, diaphoresis and fever.  HENT:  Negative for congestion.   Eyes:  Negative for visual disturbance.  Respiratory:  Positive for shortness of breath. Negative for cough.   Cardiovascular:  Positive for chest pain. Negative for palpitations and syncope.  Gastrointestinal:  Positive for constipation. Negative for abdominal pain, diarrhea and nausea.  Genitourinary:  Negative for difficulty urinating.   Musculoskeletal:  Positive for back pain.  Neurological:  Negative for dizziness and weakness.  Psychiatric/Behavioral:  The patient is nervous/anxious.    Physical Exam Updated Vital Signs BP (!) 141/82 (BP Location: Right Arm)   Pulse 68   Temp 98.7 F (37.1 C)   Resp  18   Ht 5\' 4"  (1.626 m)   Wt 63.5 kg   LMP 09/27/2003   SpO2 100%   BMI 24.03 kg/m   Physical Exam Vitals reviewed.  Constitutional:      General: She is not in acute distress.    Appearance: She is well-developed. She is not toxic-appearing or diaphoretic.  HENT:     Head: Normocephalic.  Eyes:     Extraocular Movements: Extraocular movements intact.     Pupils: Pupils are equal, round, and reactive to light.  Neck:     Vascular: No JVD.  Cardiovascular:     Rate and Rhythm: Normal rate and regular rhythm.     Pulses:          Posterior tibial pulses are 2+ on the right side and 2+ on the left side.     Heart sounds: No murmur heard. No systolic murmur is present.    No friction rub. No gallop. No S3 or S4 sounds.  Pulmonary:     Effort: Pulmonary effort is normal. No respiratory distress.     Breath sounds: Normal breath sounds. No wheezing, rhonchi or rales.  Chest:     Chest wall: No tenderness or crepitus.  Abdominal:     General: Bowel sounds are normal. There is no abdominal bruit.     Palpations: Abdomen is soft.     Tenderness: There is no abdominal tenderness. There is no guarding or rebound.  Musculoskeletal:     Comments: Moves all extremities appropriately  Neurological:     Mental Status: She is alert and oriented to person, place, and time.  Psychiatric:        Mood and Affect: Mood normal. Mood is not anxious.        Behavior: Behavior normal.    ED Results / Procedures / Treatments   Labs (all labs ordered are listed, but only abnormal results are displayed) Labs Reviewed  RESP PANEL BY RT-PCR (FLU A&B, COVID) ARPGX2 - Abnormal; Notable for the following components:       Result Value   SARS Coronavirus 2 by RT PCR POSITIVE (*)    All other components within normal limits  BASIC METABOLIC PANEL - Abnormal; Notable for the following components:   Glucose, Bld 107 (*)    All other components within normal limits  CBC  HIV ANTIBODY (ROUTINE TESTING W REFLEX)  MAGNESIUM  TSH  T4, FREE  HEMOGLOBIN V6H  BASIC METABOLIC PANEL  LIPID PANEL  CBC  PROTIME-INR  HEPARIN LEVEL (UNFRACTIONATED)  TROPONIN I (HIGH SENSITIVITY)  TROPONIN I (HIGH SENSITIVITY)    EKG EKG Interpretation  Date/Time:  Tuesday July 27 2021 17:00:47 EDT Ventricular Rate:  66 PR Interval:  158 QRS Duration: 84 QT Interval:  426 QTC Calculation: 446 R Axis:   87 Text Interpretation: Normal sinus rhythm T wave abnormality, consider inferior ischemia Abnormal ECG Confirmed by Madalyn Rob [id number]) on 07/27/2021 5:30:24 PM  Radiology DG Chest 2 View  Result Date: 07/27/2021 CLINICAL DATA:  Chest pain. EXAM: CHEST - 2 VIEW COMPARISON:  Chest radiograph dated 10/10/2016. FINDINGS: The heart size and mediastinal contours are within normal limits. Both lungs are clear. The visualized skeletal structures are unremarkable. IMPRESSION: No active cardiopulmonary disease. Electronically Signed   By: Anner Crete M.D.   On: 07/27/2021 19:09    Procedures Procedures   Medications Ordered in ED Medications  aspirin chewable tablet 324 mg (324 mg Oral Given 07/27/21 1835)    ED Course  I have reviewed the triage vital signs and the nursing notes.  Pertinent labs & imaging results that were available during my care of the patient were reviewed by me and considered in my medical decision making (see chart for details).  1745-Stable. Reviewed EKG. Concerning T wave inversions in multiple leads, trop 14. Plan to give ASA, start heparin and consult cardiology.   6503- Patient in xray.  2008-Patient feeling better. No longer endorsing epigastric pain. Has seen cardiologist. Aware that  she will be admitted to the hospital. Spoke with patient's daughter via phone and attempted to answer questions. Encouraged them to speak with cardiology for more in depth questions about heart cath procedure.   Of note, +COVID likely from prior illness she endorsed several weeks ago.    MDM Rules/Calculators/A&P                          70 yo F w/ PMHx of anxiety, HLD presenting with 1 day of intermittent chest pain with exertion relieved with rest. EKG changes with ST-changes/depressions in multiple leads. Trop 14. Suspicious for ACS. Ddx also includes anxiety attack and GERD but these would not cause obvious ST changes on EKG. Cardiology consulted and plans to admit for RHC. Patient admitted relief following ASA upon reassessment. She is stable for admission.   Final Clinical Impression(s) / ED Diagnoses Final diagnoses:  NSTEMI (non-ST elevated myocardial infarction) [facility])    Rx / Croom Orders ED Discharge Orders     None        Autry-Lott, Naaman Plummer, DO 07/27/21 2044    Lucrezia Starch, MD 07/30/21 0900

## 2021-07-27 NOTE — ED Triage Notes (Signed)
Pt here POV with CP. Pt states she has a rock on chest. Pain in back along shoulder blades and jaw. Began yesterday.

## 2021-07-27 NOTE — Telephone Encounter (Signed)
Per Dr. Quincy Simmonds pt needs to call 911 to be seen by emergency room. Spoke with patient she is aware of recommendations.

## 2021-07-28 ENCOUNTER — Inpatient Hospital Stay (HOSPITAL_COMMUNITY): Payer: Medicare Other

## 2021-07-28 ENCOUNTER — Inpatient Hospital Stay (HOSPITAL_COMMUNITY): Payer: Medicare Other | Admitting: Anesthesiology

## 2021-07-28 ENCOUNTER — Inpatient Hospital Stay (HOSPITAL_COMMUNITY)
Admission: EM | Disposition: A | Discharge status: Home Health | Payer: Self-pay | Source: Home / Self Care | Attending: Thoracic Surgery (Cardiothoracic Vascular Surgery)

## 2021-07-28 ENCOUNTER — Encounter (HOSPITAL_COMMUNITY)
Admission: EM | Disposition: A | Discharge status: Home Health | Payer: Self-pay | Source: Home / Self Care | Attending: Thoracic Surgery (Cardiothoracic Vascular Surgery)

## 2021-07-28 DIAGNOSIS — E785 Hyperlipidemia, unspecified: Secondary | ICD-10-CM

## 2021-07-28 DIAGNOSIS — R079 Chest pain, unspecified: Secondary | ICD-10-CM

## 2021-07-28 DIAGNOSIS — I25110 Atherosclerotic heart disease of native coronary artery with unstable angina pectoris: Principal | ICD-10-CM

## 2021-07-28 DIAGNOSIS — I200 Unstable angina: Secondary | ICD-10-CM | POA: Diagnosis not present

## 2021-07-28 HISTORY — PX: CORONARY ARTERY BYPASS GRAFT: SHX141

## 2021-07-28 HISTORY — PX: LEFT HEART CATH AND CORONARY ANGIOGRAPHY: CATH118249

## 2021-07-28 HISTORY — PX: IABP INSERTION: CATH118242

## 2021-07-28 LAB — POCT I-STAT, CHEM 8
BUN: 7 mg/dL — ABNORMAL LOW (ref 8–23)
BUN: 6 mg/dL — ABNORMAL LOW (ref 8–23)
BUN: 6 mg/dL — ABNORMAL LOW (ref 8–23)
BUN: 5 mg/dL — ABNORMAL LOW (ref 8–23)
Calcium, Ion: 1.21 mmol/L (ref 1.15–1.40)
Calcium, Ion: 1.17 mmol/L (ref 1.15–1.40)
Calcium, Ion: 0.95 mmol/L — ABNORMAL LOW (ref 1.15–1.40)
Calcium, Ion: 0.96 mmol/L — ABNORMAL LOW (ref 1.15–1.40)
Chloride: 106 mmol/L (ref 98–111)
Chloride: 106 mmol/L (ref 98–111)
Chloride: 102 mmol/L (ref 98–111)
Chloride: 104 mmol/L (ref 98–111)
Creatinine, Ser: 0.5 mg/dL (ref 0.44–1.00)
Creatinine, Ser: 0.5 mg/dL (ref 0.44–1.00)
Creatinine, Ser: 0.4 mg/dL — ABNORMAL LOW (ref 0.44–1.00)
Creatinine, Ser: 0.5 mg/dL (ref 0.44–1.00)
Glucose, Bld: 124 mg/dL — ABNORMAL HIGH (ref 70–99)
Glucose, Bld: 105 mg/dL — ABNORMAL HIGH (ref 70–99)
Glucose, Bld: 100 mg/dL — ABNORMAL HIGH (ref 70–99)
Glucose, Bld: 106 mg/dL — ABNORMAL HIGH (ref 70–99)
HCT: 34 % — ABNORMAL LOW (ref 36.0–46.0)
HCT: 29 % — ABNORMAL LOW (ref 36.0–46.0)
HCT: 20 % — ABNORMAL LOW (ref 36.0–46.0)
HCT: 21 % — ABNORMAL LOW (ref 36.0–46.0)
Hemoglobin: 11.6 g/dL — ABNORMAL LOW (ref 12.0–15.0)
Hemoglobin: 9.9 g/dL — ABNORMAL LOW (ref 12.0–15.0)
Hemoglobin: 6.8 g/dL — CL (ref 12.0–15.0)
Hemoglobin: 7.1 g/dL — ABNORMAL LOW (ref 12.0–15.0)
Potassium: 4 mmol/L (ref 3.5–5.1)
Potassium: 3.8 mmol/L (ref 3.5–5.1)
Potassium: 4.7 mmol/L (ref 3.5–5.1)
Potassium: 4.3 mmol/L (ref 3.5–5.1)
Sodium: 141 mmol/L (ref 135–145)
Sodium: 139 mmol/L (ref 135–145)
Sodium: 136 mmol/L (ref 135–145)
Sodium: 139 mmol/L (ref 135–145)
TCO2: 25 mmol/L (ref 22–32)
TCO2: 26 mmol/L (ref 22–32)
TCO2: 25 mmol/L (ref 22–32)
TCO2: 24 mmol/L (ref 22–32)

## 2021-07-28 LAB — PREPARE RBC (CROSSMATCH)

## 2021-07-28 LAB — POCT I-STAT 7, (LYTES, BLD GAS, ICA,H+H)
Acid-Base Excess: 1 mmol/L (ref 0.0–2.0)
Acid-Base Excess: 2 mmol/L (ref 0.0–2.0)
Acid-base deficit: 2 mmol/L (ref 0.0–2.0)
Acid-base deficit: 1 mmol/L (ref 0.0–2.0)
Bicarbonate: 23.7 mmol/L (ref 20.0–28.0)
Bicarbonate: 24.8 mmol/L (ref 20.0–28.0)
Bicarbonate: 25.1 mmol/L (ref 20.0–28.0)
Bicarbonate: 22.6 mmol/L (ref 20.0–28.0)
Calcium, Ion: 1.19 mmol/L (ref 1.15–1.40)
Calcium, Ion: 0.9 mmol/L — ABNORMAL LOW (ref 1.15–1.40)
Calcium, Ion: 0.97 mmol/L — ABNORMAL LOW (ref 1.15–1.40)
Calcium, Ion: 0.96 mmol/L — ABNORMAL LOW (ref 1.15–1.40)
HCT: 35 % — ABNORMAL LOW (ref 36.0–46.0)
HCT: 21 % — ABNORMAL LOW (ref 36.0–46.0)
HCT: 21 % — ABNORMAL LOW (ref 36.0–46.0)
HCT: 20 % — ABNORMAL LOW (ref 36.0–46.0)
Hemoglobin: 11.9 g/dL — ABNORMAL LOW (ref 12.0–15.0)
Hemoglobin: 7.1 g/dL — ABNORMAL LOW (ref 12.0–15.0)
Hemoglobin: 7.1 g/dL — ABNORMAL LOW (ref 12.0–15.0)
Hemoglobin: 6.8 g/dL — CL (ref 12.0–15.0)
O2 Saturation: 100 %
O2 Saturation: 100 %
O2 Saturation: 100 %
O2 Saturation: 100 %
Potassium: 4 mmol/L (ref 3.5–5.1)
Potassium: 4.1 mmol/L (ref 3.5–5.1)
Potassium: 4.7 mmol/L (ref 3.5–5.1)
Potassium: 4.3 mmol/L (ref 3.5–5.1)
Sodium: 139 mmol/L (ref 135–145)
Sodium: 141 mmol/L (ref 135–145)
Sodium: 137 mmol/L (ref 135–145)
Sodium: 139 mmol/L (ref 135–145)
TCO2: 25 mmol/L (ref 22–32)
TCO2: 26 mmol/L (ref 22–32)
TCO2: 26 mmol/L (ref 22–32)
TCO2: 23 mmol/L (ref 22–32)
pCO2 arterial: 43.6 mmHg (ref 32.0–48.0)
pCO2 arterial: 32.3 mmHg (ref 32.0–48.0)
pCO2 arterial: 29.2 mmHg — ABNORMAL LOW (ref 32.0–48.0)
pCO2 arterial: 30 mmHg — ABNORMAL LOW (ref 32.0–48.0)
pH, Arterial: 7.342 — ABNORMAL LOW (ref 7.350–7.450)
pH, Arterial: 7.492 — ABNORMAL HIGH (ref 7.350–7.450)
pH, Arterial: 7.542 — ABNORMAL HIGH (ref 7.350–7.450)
pH, Arterial: 7.484 — ABNORMAL HIGH (ref 7.350–7.450)
pO2, Arterial: 508 mmHg — ABNORMAL HIGH (ref 83.0–108.0)
pO2, Arterial: 430 mmHg — ABNORMAL HIGH (ref 83.0–108.0)
pO2, Arterial: 376 mmHg — ABNORMAL HIGH (ref 83.0–108.0)
pO2, Arterial: 380 mmHg — ABNORMAL HIGH (ref 83.0–108.0)

## 2021-07-28 LAB — HEMOGLOBIN A1C
Hgb A1c MFr Bld: 5.2 % (ref 4.8–5.6)
Mean Plasma Glucose: 102.54 mg/dL

## 2021-07-28 LAB — BASIC METABOLIC PANEL
Anion gap: 8 (ref 5–15)
BUN: 9 mg/dL (ref 8–23)
CO2: 24 mmol/L (ref 22–32)
Calcium: 8.6 mg/dL — ABNORMAL LOW (ref 8.9–10.3)
Chloride: 105 mmol/L (ref 98–111)
Creatinine, Ser: 0.75 mg/dL (ref 0.44–1.00)
GFR, Estimated: >60 mL/min (ref >60)
Glucose, Bld: 97 mg/dL (ref 70–99)
Potassium: 3.5 mmol/L (ref 3.5–5.1)
Sodium: 137 mmol/L (ref 135–145)

## 2021-07-28 LAB — LIPID PANEL
Cholesterol: 236 mg/dL — ABNORMAL HIGH (ref 0–200)
HDL: 49 mg/dL (ref >40)
LDL Cholesterol: 158 mg/dL — ABNORMAL HIGH (ref 0–99)
Total CHOL/HDL Ratio: 4.8 RATIO
Triglycerides: 144 mg/dL (ref <150)
VLDL: 29 mg/dL (ref 0–40)

## 2021-07-28 LAB — HEPARIN LEVEL (UNFRACTIONATED)
Heparin Unfractionated: 0.36 IU/mL (ref 0.30–0.70)
Heparin Unfractionated: 0.32 IU/mL (ref 0.30–0.70)

## 2021-07-28 LAB — POCT I-STAT EG7
Acid-base deficit: 1 mmol/L (ref 0.0–2.0)
Bicarbonate: 22.6 mmol/L (ref 20.0–28.0)
Calcium, Ion: 0.93 mmol/L — ABNORMAL LOW (ref 1.15–1.40)
HCT: 21 % — ABNORMAL LOW (ref 36.0–46.0)
Hemoglobin: 7.1 g/dL — ABNORMAL LOW (ref 12.0–15.0)
O2 Saturation: 78 %
Potassium: 5.8 mmol/L — ABNORMAL HIGH (ref 3.5–5.1)
Sodium: 140 mmol/L (ref 135–145)
TCO2: 24 mmol/L (ref 22–32)
pCO2, Ven: 30.7 mmHg — ABNORMAL LOW (ref 44.0–60.0)
pH, Ven: 7.476 — ABNORMAL HIGH (ref 7.250–7.430)
pO2, Ven: 39 mmHg (ref 32.0–45.0)

## 2021-07-28 LAB — PROTIME-INR
INR: 1 (ref 0.8–1.2)
INR: 1.7 — ABNORMAL HIGH (ref 0.8–1.2)
Prothrombin Time: 13.5 seconds (ref 11.4–15.2)
Prothrombin Time: 20.3 seconds — ABNORMAL HIGH (ref 11.4–15.2)

## 2021-07-28 LAB — CBC
HCT: 40.1 % (ref 36.0–46.0)
Hemoglobin: 13 g/dL (ref 12.0–15.0)
MCH: 31.6 pg (ref 26.0–34.0)
MCHC: 32.4 g/dL (ref 30.0–36.0)
MCV: 97.3 fL (ref 80.0–100.0)
Platelets: 289 10*3/uL (ref 150–400)
RBC: 4.12 MIL/uL (ref 3.87–5.11)
RDW: 12.9 % (ref 11.5–15.5)
WBC: 6 10*3/uL (ref 4.0–10.5)
nRBC: 0 % (ref 0.0–0.2)

## 2021-07-28 LAB — ECHOCARDIOGRAM COMPLETE
Area-P 1/2: 4.68 cm2
Height: 64 in
S' Lateral: 2.2 cm
Weight: 2240 oz

## 2021-07-28 LAB — APTT: aPTT: 35 seconds (ref 24–36)

## 2021-07-28 LAB — ABO/RH: ABO/RH(D): A POS

## 2021-07-28 LAB — PLATELET COUNT: Platelets: 161 10*3/uL (ref 150–400)

## 2021-07-28 SURGERY — LEFT HEART CATH AND CORONARY ANGIOGRAPHY
Anesthesia: LOCAL | Laterality: Right

## 2021-07-28 SURGERY — CORONARY ARTERY BYPASS GRAFTING (CABG)
Anesthesia: General | Site: Chest

## 2021-07-28 MED ORDER — FENTANYL CITRATE (PF) 100 MCG/2ML IJ SOLN
INTRAMUSCULAR | Status: AC
  Filled 2021-07-28: qty 2

## 2021-07-28 MED ORDER — NITROGLYCERIN IN D5W 200-5 MCG/ML-% IV SOLN
2.0000 ug/min | INTRAVENOUS | Status: AC
  Administered 2021-07-28: 16.6 ug/min via INTRAVENOUS
  Filled 2021-07-28: qty 250

## 2021-07-28 MED ORDER — LIDOCAINE HCL (PF) 1 % IJ SOLN
INTRAMUSCULAR | Status: AC
  Filled 2021-07-28: qty 30

## 2021-07-28 MED ORDER — VANCOMYCIN HCL 1250 MG/250ML IV SOLN
1250.0000 mg | INTRAVENOUS | Status: AC
  Administered 2021-07-28: 1250 mg via INTRAVENOUS
  Filled 2021-07-28: qty 250

## 2021-07-28 MED ORDER — METOPROLOL TARTRATE 12.5 MG HALF TABLET: 12.5000 mg | ORAL_TABLET | Freq: Once | ORAL | Status: DC

## 2021-07-28 MED ORDER — CHLORHEXIDINE GLUCONATE CLOTH 2 % EX PADS: 6.0000 | MEDICATED_PAD | Freq: Once | CUTANEOUS | Status: DC

## 2021-07-28 MED ORDER — PROTAMINE SULFATE 10 MG/ML IV SOLN
INTRAVENOUS | Status: DC | PRN
  Administered 2021-07-28 (×3): 30 mg via INTRAVENOUS
  Administered 2021-07-28: 50 mg via INTRAVENOUS
  Administered 2021-07-28 (×2): 40 mg via INTRAVENOUS

## 2021-07-28 MED ORDER — 0.9 % SODIUM CHLORIDE (POUR BTL) OPTIME
TOPICAL | Status: DC | PRN
  Administered 2021-07-28: 5000 mL

## 2021-07-28 MED ORDER — LIDOCAINE HCL (PF) 1 % IJ SOLN
INTRAMUSCULAR | Status: DC | PRN
  Administered 2021-07-28: 3 mL via INTRADERMAL
  Administered 2021-07-28: 10 mL via INTRADERMAL

## 2021-07-28 MED ORDER — LIDOCAINE 2% (20 MG/ML) 5 ML SYRINGE
INTRAMUSCULAR | Status: DC | PRN
  Administered 2021-07-28: 30 mg via INTRAVENOUS

## 2021-07-28 MED ORDER — ASPIRIN 81 MG PO CHEW
324.0000 mg | CHEWABLE_TABLET | Freq: Every day | ORAL | Status: DC
  Administered 2021-07-30: 324 mg
  Filled 2021-07-28 (×2): qty 4

## 2021-07-28 MED ORDER — MORPHINE SULFATE (PF) 2 MG/ML IV SOLN
1.0000 mg | INTRAVENOUS | Status: DC | PRN
  Administered 2021-07-29 (×5): 2 mg via INTRAVENOUS
  Filled 2021-07-28 (×6): qty 1

## 2021-07-28 MED ORDER — SODIUM CHLORIDE 0.9% FLUSH: 3.0000 mL | INTRAVENOUS | Status: DC | PRN

## 2021-07-28 MED ORDER — TRANEXAMIC ACID 1000 MG/10ML IV SOLN
1.5000 mg/kg/h | INTRAVENOUS | Status: AC
  Administered 2021-07-28: 1.5 mg/kg/h via INTRAVENOUS
  Filled 2021-07-28: qty 25

## 2021-07-28 MED ORDER — VERAPAMIL HCL 2.5 MG/ML IV SOLN
INTRAVENOUS | Status: AC
  Filled 2021-07-28: qty 2

## 2021-07-28 MED ORDER — LACTATED RINGERS IV SOLN: 500.0000 mL | Freq: Once | INTRAVENOUS | Status: DC | PRN

## 2021-07-28 MED ORDER — PHENYLEPHRINE HCL (PRESSORS) 10 MG/ML IV SOLN
INTRAVENOUS | Status: DC | PRN
  Administered 2021-07-28: 100 ug via INTRAVENOUS

## 2021-07-28 MED ORDER — SODIUM CHLORIDE 0.45 % IV SOLN: INTRAVENOUS | Status: DC | PRN

## 2021-07-28 MED ORDER — FENTANYL CITRATE (PF) 100 MCG/2ML IJ SOLN
INTRAMUSCULAR | Status: DC | PRN
  Administered 2021-07-28 (×3): 25 ug via INTRAVENOUS

## 2021-07-28 MED ORDER — TRAMADOL HCL 50 MG PO TABS
50.0000 mg | ORAL_TABLET | ORAL | Status: DC | PRN
  Administered 2021-07-29: 100 mg via ORAL
  Filled 2021-07-28: qty 2

## 2021-07-28 MED ORDER — PLASMA-LYTE A IV SOLN
INTRAVENOUS | Status: DC | PRN
  Administered 2021-07-28: 1000 mL via INTRAVASCULAR

## 2021-07-28 MED ORDER — MIDAZOLAM HCL 5 MG/5ML IJ SOLN
INTRAMUSCULAR | Status: DC | PRN
  Administered 2021-07-28: 2.5 mg via INTRAVENOUS
  Administered 2021-07-28: 2 mg via INTRAVENOUS
  Administered 2021-07-28: 5 mg via INTRAVENOUS
  Administered 2021-07-28: .5 mg via INTRAVENOUS

## 2021-07-28 MED ORDER — ALBUMIN HUMAN 5 % IV SOLN: INTRAVENOUS | Status: DC | PRN

## 2021-07-28 MED ORDER — ACETAMINOPHEN 650 MG RE SUPP
650.0000 mg | Freq: Once | RECTAL | Status: AC
  Administered 2021-07-28: 650 mg via RECTAL

## 2021-07-28 MED ORDER — FENTANYL CITRATE (PF) 250 MCG/5ML IJ SOLN
INTRAMUSCULAR | Status: AC
  Filled 2021-07-28: qty 5

## 2021-07-28 MED ORDER — METOPROLOL TARTRATE 12.5 MG HALF TABLET
12.5000 mg | ORAL_TABLET | Freq: Two times a day (BID) | ORAL | Status: DC
  Administered 2021-07-30 – 2021-07-31 (×4): 12.5 mg via ORAL
  Filled 2021-07-28 (×5): qty 1

## 2021-07-28 MED ORDER — CHLORHEXIDINE GLUCONATE 0.12 % MT SOLN
15.0000 mL | OROMUCOSAL | Status: AC
  Administered 2021-07-29: 15 mL via OROMUCOSAL

## 2021-07-28 MED ORDER — ASPIRIN 81 MG PO CHEW: 81.0000 mg | CHEWABLE_TABLET | ORAL | Status: AC

## 2021-07-28 MED ORDER — ASPIRIN 81 MG PO CHEW: 81.0000 mg | CHEWABLE_TABLET | ORAL | Status: DC

## 2021-07-28 MED ORDER — FAMOTIDINE IN NACL 20-0.9 MG/50ML-% IV SOLN
20.0000 mg | Freq: Two times a day (BID) | INTRAVENOUS | Status: AC
  Administered 2021-07-29: 20 mg via INTRAVENOUS
  Filled 2021-07-28: qty 50

## 2021-07-28 MED ORDER — LACTATED RINGERS IV SOLN: INTRAVENOUS | Status: DC

## 2021-07-28 MED ORDER — NOREPINEPHRINE 4 MG/250ML-% IV SOLN
0.0000 ug/min | INTRAVENOUS | Status: DC
Start: 2021-07-29 — End: 2021-07-28
  Filled 2021-07-28: qty 250

## 2021-07-28 MED ORDER — SUCCINYLCHOLINE CHLORIDE 200 MG/10ML IV SOSY
PREFILLED_SYRINGE | INTRAVENOUS | Status: DC | PRN
  Administered 2021-07-28: 100 mg via INTRAVENOUS

## 2021-07-28 MED ORDER — HEPARIN SODIUM (PORCINE) 1000 UNIT/ML IJ SOLN
INTRAMUSCULAR | Status: DC | PRN
  Administered 2021-07-28: 3000 [IU] via INTRAVENOUS
  Administered 2021-07-28: 3500 [IU] via INTRAVENOUS

## 2021-07-28 MED ORDER — LACTATED RINGERS IV SOLN: INTRAVENOUS | Status: DC | PRN

## 2021-07-28 MED ORDER — OXYCODONE HCL 5 MG PO TABS: 5.0000 mg | ORAL_TABLET | ORAL | Status: DC | PRN

## 2021-07-28 MED ORDER — ASPIRIN EC 325 MG PO TBEC
325.0000 mg | DELAYED_RELEASE_TABLET | Freq: Every day | ORAL | Status: DC
  Administered 2021-07-29 – 2021-08-01 (×3): 325 mg via ORAL
  Filled 2021-07-28 (×3): qty 1

## 2021-07-28 MED ORDER — MAGNESIUM SULFATE 4 GM/100ML IV SOLN
4.0000 g | Freq: Once | INTRAVENOUS | Status: AC
  Administered 2021-07-28: 4 g via INTRAVENOUS
  Filled 2021-07-28: qty 100

## 2021-07-28 MED ORDER — ACETAMINOPHEN 160 MG/5ML PO SOLN
1000.0000 mg | Freq: Four times a day (QID) | ORAL | Status: AC
  Administered 2021-07-29 – 2021-07-30 (×6): 1000 mg
  Filled 2021-07-28 (×6): qty 40.6

## 2021-07-28 MED ORDER — ACETAMINOPHEN 160 MG/5ML PO SOLN: 650.0000 mg | Freq: Once | ORAL | Status: AC

## 2021-07-28 MED ORDER — LEVOFLOXACIN IN D5W 500 MG/100ML IV SOLN
500.0000 mg | INTRAVENOUS | Status: AC
  Administered 2021-07-28: 500 mg via INTRAVENOUS
  Filled 2021-07-28: qty 100

## 2021-07-28 MED ORDER — CEFAZOLIN SODIUM-DEXTROSE 2-4 GM/100ML-% IV SOLN: 2.0000 g | Freq: Three times a day (TID) | INTRAVENOUS | Status: DC

## 2021-07-28 MED ORDER — MIDAZOLAM HCL 2 MG/2ML IJ SOLN
INTRAMUSCULAR | Status: DC | PRN
  Administered 2021-07-28: 1 mg via INTRAVENOUS
  Administered 2021-07-28: 2 mg via INTRAVENOUS

## 2021-07-28 MED ORDER — NITROGLYCERIN 1 MG/10 ML FOR IR/CATH LAB
INTRA_ARTERIAL | Status: AC
  Filled 2021-07-28: qty 10

## 2021-07-28 MED ORDER — SODIUM CHLORIDE 0.9 % WEIGHT BASED INFUSION: 3.0000 mL/kg/h | INTRAVENOUS | Status: DC

## 2021-07-28 MED ORDER — INSULIN REGULAR(HUMAN) IN NACL 100-0.9 UT/100ML-% IV SOLN
INTRAVENOUS | Status: AC
  Administered 2021-07-28: 1 [IU]/h via INTRAVENOUS
  Filled 2021-07-28: qty 100

## 2021-07-28 MED ORDER — SODIUM CHLORIDE 0.9 % WEIGHT BASED INFUSION
1.0000 mL/kg/h | INTRAVENOUS | Status: DC
  Administered 2021-07-28: 1 mL/kg/h via INTRAVENOUS

## 2021-07-28 MED ORDER — HEPARIN (PORCINE) IN NACL 1000-0.9 UT/500ML-% IV SOLN
INTRAVENOUS | Status: DC | PRN
  Administered 2021-07-28 (×2): 500 mL

## 2021-07-28 MED ORDER — TRANEXAMIC ACID (OHS) BOLUS VIA INFUSION
15.0000 mg/kg | INTRAVENOUS | Status: AC
  Administered 2021-07-28: 952.5 mg via INTRAVENOUS
  Filled 2021-07-28: qty 953

## 2021-07-28 MED ORDER — IOHEXOL 350 MG/ML SOLN
INTRAVENOUS | Status: DC | PRN
  Administered 2021-07-28: 70 mL

## 2021-07-28 MED ORDER — FENTANYL CITRATE (PF) 250 MCG/5ML IJ SOLN
INTRAMUSCULAR | Status: AC
  Filled 2021-07-28: qty 20

## 2021-07-28 MED ORDER — HEPARIN 30,000 UNITS/1000 ML (OHS) CELLSAVER SOLUTION
Status: DC
  Filled 2021-07-28: qty 1000

## 2021-07-28 MED ORDER — HEPARIN SODIUM (PORCINE) 1000 UNIT/ML IJ SOLN
INTRAMUSCULAR | Status: AC
  Filled 2021-07-28: qty 1

## 2021-07-28 MED ORDER — DEXMEDETOMIDINE HCL IN NACL 400 MCG/100ML IV SOLN
0.0000 ug/kg/h | INTRAVENOUS | Status: DC
  Administered 2021-07-29: 0.5 ug/kg/h via INTRAVENOUS
  Filled 2021-07-28: qty 100

## 2021-07-28 MED ORDER — ALBUMIN HUMAN 5 % IV SOLN
250.0000 mL | INTRAVENOUS | Status: AC | PRN
  Administered 2021-07-28 – 2021-07-29 (×3): 12.5 g via INTRAVENOUS
  Filled 2021-07-28: qty 250

## 2021-07-28 MED ORDER — HEPARIN SODIUM (PORCINE) 1000 UNIT/ML IJ SOLN
INTRAMUSCULAR | Status: DC | PRN
  Administered 2021-07-28: 20000 [IU] via INTRAVENOUS

## 2021-07-28 MED ORDER — SODIUM CHLORIDE 0.9% FLUSH
3.0000 mL | Freq: Two times a day (BID) | INTRAVENOUS | Status: DC
  Administered 2021-07-29 – 2021-08-01 (×6): 3 mL via INTRAVENOUS

## 2021-07-28 MED ORDER — VERAPAMIL HCL 2.5 MG/ML IV SOLN
INTRAVENOUS | Status: DC | PRN
  Administered 2021-07-28: 10 mL via INTRA_ARTERIAL

## 2021-07-28 MED ORDER — MIDAZOLAM HCL 2 MG/2ML IJ SOLN: 2.0000 mg | INTRAMUSCULAR | Status: DC | PRN

## 2021-07-28 MED ORDER — PHENYLEPHRINE HCL-NACL 20-0.9 MG/250ML-% IV SOLN
0.0000 ug/min | INTRAVENOUS | Status: DC
  Administered 2021-07-29: 50 ug/min via INTRAVENOUS
  Administered 2021-07-29: 12 ug/min via INTRAVENOUS
  Filled 2021-07-28 (×2): qty 250

## 2021-07-28 MED ORDER — CHLORHEXIDINE GLUCONATE 0.12 % MT SOLN: 15.0000 mL | Freq: Once | OROMUCOSAL | Status: DC

## 2021-07-28 MED ORDER — MIDAZOLAM HCL 2 MG/2ML IJ SOLN
INTRAMUSCULAR | Status: AC
  Filled 2021-07-28: qty 2

## 2021-07-28 MED ORDER — PANTOPRAZOLE SODIUM 40 MG PO TBEC
40.0000 mg | DELAYED_RELEASE_TABLET | Freq: Every day | ORAL | Status: DC
  Administered 2021-07-29 – 2021-08-03 (×6): 40 mg via ORAL
  Filled 2021-07-28 (×6): qty 1

## 2021-07-28 MED ORDER — ACETAMINOPHEN 500 MG PO TABS
1000.0000 mg | ORAL_TABLET | Freq: Four times a day (QID) | ORAL | Status: AC
  Administered 2021-07-31 – 2021-08-02 (×4): 1000 mg via ORAL
  Filled 2021-07-28 (×4): qty 2

## 2021-07-28 MED ORDER — DEXMEDETOMIDINE HCL IN NACL 400 MCG/100ML IV SOLN
0.1000 ug/kg/h | INTRAVENOUS | Status: AC
  Administered 2021-07-28: .4 ug/kg/h via INTRAVENOUS
  Filled 2021-07-28: qty 100

## 2021-07-28 MED ORDER — POTASSIUM CHLORIDE 2 MEQ/ML IV SOLN
80.0000 meq | INTRAVENOUS | Status: DC
  Filled 2021-07-28: qty 40

## 2021-07-28 MED ORDER — DOCUSATE SODIUM 100 MG PO CAPS
200.0000 mg | ORAL_CAPSULE | Freq: Every day | ORAL | Status: DC
  Administered 2021-07-29 – 2021-07-31 (×3): 200 mg via ORAL
  Filled 2021-07-28 (×3): qty 2

## 2021-07-28 MED ORDER — SODIUM CHLORIDE 0.9 % IV SOLN: INTRAVENOUS | Status: DC

## 2021-07-28 MED ORDER — ETOMIDATE 2 MG/ML IV SOLN
INTRAVENOUS | Status: DC | PRN
  Administered 2021-07-28: 10 mg via INTRAVENOUS

## 2021-07-28 MED ORDER — POTASSIUM CHLORIDE 10 MEQ/50ML IV SOLN
10.0000 meq | INTRAVENOUS | Status: AC
  Administered 2021-07-28 – 2021-07-29 (×4): 10 meq via INTRAVENOUS
  Filled 2021-07-28: qty 50

## 2021-07-28 MED ORDER — INSULIN REGULAR(HUMAN) IN NACL 100-0.9 UT/100ML-% IV SOLN: INTRAVENOUS | Status: DC

## 2021-07-28 MED ORDER — MAGNESIUM SULFATE 50 % IJ SOLN
40.0000 meq | INTRAMUSCULAR | Status: DC
  Filled 2021-07-28: qty 9.85

## 2021-07-28 MED ORDER — TRANEXAMIC ACID (OHS) PUMP PRIME SOLUTION
2.0000 mg/kg | INTRAVENOUS | Status: DC
  Filled 2021-07-28: qty 1.27

## 2021-07-28 MED ORDER — SODIUM CHLORIDE 0.9 % WEIGHT BASED INFUSION: 1.0000 mL/kg/h | INTRAVENOUS | Status: DC

## 2021-07-28 MED ORDER — MIDAZOLAM HCL (PF) 10 MG/2ML IJ SOLN
INTRAMUSCULAR | Status: AC
  Filled 2021-07-28: qty 2

## 2021-07-28 MED ORDER — NITROGLYCERIN IN D5W 200-5 MCG/ML-% IV SOLN: 0.0000 ug/min | INTRAVENOUS | Status: DC

## 2021-07-28 MED ORDER — SODIUM CHLORIDE (PF) 0.9 % IJ SOLN
OROMUCOSAL | Status: DC | PRN
  Administered 2021-07-28 (×3): 4 mL via TOPICAL

## 2021-07-28 MED ORDER — ROCURONIUM BROMIDE 100 MG/10ML IV SOLN
INTRAVENOUS | Status: DC | PRN
  Administered 2021-07-28: 100 mg via INTRAVENOUS
  Administered 2021-07-28: 50 mg via INTRAVENOUS
  Administered 2021-07-28: 30 mg via INTRAVENOUS

## 2021-07-28 MED ORDER — HEPARIN (PORCINE) IN NACL 1000-0.9 UT/500ML-% IV SOLN
INTRAVENOUS | Status: AC
  Filled 2021-07-28: qty 1000

## 2021-07-28 MED ORDER — MILRINONE LACTATE IN DEXTROSE 20-5 MG/100ML-% IV SOLN
0.3000 ug/kg/min | INTRAVENOUS | Status: DC
  Filled 2021-07-28: qty 100

## 2021-07-28 MED ORDER — NITROGLYCERIN 1 MG/10 ML FOR IR/CATH LAB
INTRA_ARTERIAL | Status: DC | PRN
  Administered 2021-07-28: 200 ug via INTRACORONARY

## 2021-07-28 MED ORDER — BISACODYL 5 MG PO TBEC
10.0000 mg | DELAYED_RELEASE_TABLET | Freq: Every day | ORAL | Status: DC
  Administered 2021-07-29 – 2021-07-31 (×3): 10 mg via ORAL
  Filled 2021-07-28 (×3): qty 2

## 2021-07-28 MED ORDER — ONDANSETRON HCL 4 MG/2ML IJ SOLN
4.0000 mg | Freq: Four times a day (QID) | INTRAMUSCULAR | Status: DC | PRN
  Administered 2021-07-30: 4 mg via INTRAVENOUS
  Filled 2021-07-28: qty 2

## 2021-07-28 MED ORDER — PLASMA-LYTE A IV SOLN
INTRAVENOUS | Status: DC
  Filled 2021-07-28: qty 5

## 2021-07-28 MED ORDER — EPINEPHRINE HCL 5 MG/250ML IV SOLN IN NS
0.0000 ug/min | INTRAVENOUS | Status: DC
  Filled 2021-07-28: qty 250

## 2021-07-28 MED ORDER — HEMOSTATIC AGENTS (NO CHARGE) OPTIME
TOPICAL | Status: DC | PRN
  Administered 2021-07-28: 1 via TOPICAL

## 2021-07-28 MED ORDER — BISACODYL 10 MG RE SUPP: 10.0000 mg | Freq: Every day | RECTAL | Status: DC

## 2021-07-28 MED ORDER — SODIUM CHLORIDE 0.9 % IV SOLN: 250.0000 mL | INTRAVENOUS | Status: DC | PRN

## 2021-07-28 MED ORDER — PHENYLEPHRINE HCL-NACL 20-0.9 MG/250ML-% IV SOLN
30.0000 ug/min | INTRAVENOUS | Status: AC
  Administered 2021-07-28: 20 ug/min via INTRAVENOUS
  Filled 2021-07-28: qty 250

## 2021-07-28 MED ORDER — PROPOFOL 10 MG/ML IV BOLUS
INTRAVENOUS | Status: AC
  Filled 2021-07-28: qty 20

## 2021-07-28 MED ORDER — DEXTROSE 50 % IV SOLN: 0.0000 mL | INTRAVENOUS | Status: DC | PRN

## 2021-07-28 MED ORDER — METOPROLOL TARTRATE 5 MG/5ML IV SOLN: 2.5000 mg | INTRAVENOUS | Status: DC | PRN

## 2021-07-28 MED ORDER — SODIUM CHLORIDE 0.9 % IV SOLN
250.0000 mL | INTRAVENOUS | Status: DC
  Administered 2021-07-29: 250 mL via INTRAVENOUS

## 2021-07-28 MED ORDER — VANCOMYCIN HCL IN DEXTROSE 1-5 GM/200ML-% IV SOLN
1000.0000 mg | Freq: Once | INTRAVENOUS | Status: AC
  Administered 2021-07-29: 1000 mg via INTRAVENOUS
  Filled 2021-07-28: qty 200

## 2021-07-28 MED ORDER — METOPROLOL TARTRATE 25 MG/10 ML ORAL SUSPENSION: 12.5000 mg | Freq: Two times a day (BID) | ORAL | Status: DC

## 2021-07-28 MED ORDER — FENTANYL CITRATE (PF) 250 MCG/5ML IJ SOLN
INTRAMUSCULAR | Status: DC | PRN
  Administered 2021-07-28: 150 ug via INTRAVENOUS
  Administered 2021-07-28: 350 ug via INTRAVENOUS
  Administered 2021-07-28: 100 ug via INTRAVENOUS
  Administered 2021-07-28: 50 ug via INTRAVENOUS
  Administered 2021-07-28: 250 ug via INTRAVENOUS
  Administered 2021-07-28: 100 ug via INTRAVENOUS
  Administered 2021-07-28: 250 ug via INTRAVENOUS

## 2021-07-28 SURGICAL SUPPLY — 86 items
BAG DECANTER FOR FLEXI CONT (MISCELLANEOUS) ×2 IMPLANT
BLADE CLIPPER SURG (BLADE) ×2 IMPLANT
BLADE STERNUM SYSTEM 6 (BLADE) ×2 IMPLANT
BLADE SURG 11 STRL SS (BLADE) ×1 IMPLANT
BNDG ELASTIC 4X5.8 VLCR STR LF (GAUZE/BANDAGES/DRESSINGS) ×2 IMPLANT
BNDG ELASTIC 6X5.8 VLCR STR LF (GAUZE/BANDAGES/DRESSINGS) ×2 IMPLANT
BNDG GAUZE ELAST 4 BULKY (GAUZE/BANDAGES/DRESSINGS) ×2 IMPLANT
CANISTER SUCT 3000ML PPV (MISCELLANEOUS) ×2 IMPLANT
CANNULA EZ GLIDE AORTIC 21FR (CANNULA) ×2 IMPLANT
CATH CPB KIT HENDRICKSON (MISCELLANEOUS) ×2 IMPLANT
CATH ROBINSON RED A/P 18FR (CATHETERS) ×2 IMPLANT
CATH THORACIC 36FR (CATHETERS) ×2 IMPLANT
CATH THORACIC 36FR RT ANG (CATHETERS) ×2 IMPLANT
CLIP TI WIDE RED SMALL 24 (CLIP) ×2 IMPLANT
CLIP VESOCCLUDE MED 24/CT (CLIP) IMPLANT
CLIP VESOCCLUDE SM WIDE 24/CT (CLIP) IMPLANT
CONTAINER PROTECT SURGISLUSH (MISCELLANEOUS) ×1 IMPLANT
DEFOGGER ANTIFOG KIT (MISCELLANEOUS) ×1 IMPLANT
DRAPE CARDIOVASCULAR INCISE (DRAPES) ×1
DRAPE SLUSH/WARMER DISC (DRAPES) ×1 IMPLANT
DRAPE SRG 135X102X78XABS (DRAPES) ×1 IMPLANT
DRAPE WARM FLUID 44X44 (DRAPES) ×1 IMPLANT
DRSG COVADERM 4X14 (GAUZE/BANDAGES/DRESSINGS) ×2 IMPLANT
ELECT REM PT RETURN 9FT ADLT (ELECTROSURGICAL) ×4
ELECTRODE REM PT RTRN 9FT ADLT (ELECTROSURGICAL) ×2 IMPLANT
FELT TEFLON 1X6 (MISCELLANEOUS) ×3 IMPLANT
GAUZE 4X4 16PLY ~~LOC~~+RFID DBL (SPONGE) ×1 IMPLANT
GAUZE SPONGE 4X4 12PLY STRL (GAUZE/BANDAGES/DRESSINGS) ×4 IMPLANT
GLOVE SURG SIGNA 7.5 PF LTX (GLOVE) ×3 IMPLANT
GLOVE SURG UNDER POLY LF SZ6 (GLOVE) ×2 IMPLANT
GLOVE SURG UNDER POLY LF SZ7 (GLOVE) ×2 IMPLANT
GOWN STRL REUS W/ TWL LRG LVL3 (GOWN DISPOSABLE) ×4 IMPLANT
GOWN STRL REUS W/ TWL XL LVL3 (GOWN DISPOSABLE) ×2 IMPLANT
GOWN STRL REUS W/TWL LRG LVL3 (GOWN DISPOSABLE) ×4
GOWN STRL REUS W/TWL XL LVL3 (GOWN DISPOSABLE) ×2
HEMOSTAT POWDER SURGIFOAM 1G (HEMOSTASIS) ×6 IMPLANT
HEMOSTAT SURGICEL 2X14 (HEMOSTASIS) ×2 IMPLANT
INSERT FOGARTY XLG (MISCELLANEOUS) IMPLANT
KIT BASIN OR (CUSTOM PROCEDURE TRAY) ×2 IMPLANT
KIT SUCTION CATH 14FR (SUCTIONS) ×4 IMPLANT
KIT TURNOVER KIT B (KITS) ×2 IMPLANT
KIT VASOVIEW HEMOPRO 2 VH 4000 (KITS) ×2 IMPLANT
MARKER GRAFT CORONARY BYPASS (MISCELLANEOUS) ×6 IMPLANT
NS IRRIG 1000ML POUR BTL (IV SOLUTION) ×10 IMPLANT
PACK E OPEN HEART (SUTURE) ×2 IMPLANT
PACK OPEN HEART (CUSTOM PROCEDURE TRAY) ×2 IMPLANT
PAD ARMBOARD 7.5X6 YLW CONV (MISCELLANEOUS) ×4 IMPLANT
PAD ELECT DEFIB RADIOL ZOLL (MISCELLANEOUS) ×2 IMPLANT
PENCIL BUTTON HOLSTER BLD 10FT (ELECTRODE) ×2 IMPLANT
POSITIONER HEAD DONUT 9IN (MISCELLANEOUS) ×2 IMPLANT
PUNCH AORTIC ROTATE 4.0MM (MISCELLANEOUS) IMPLANT
PUNCH AORTIC ROTATE 4.5MM 8IN (MISCELLANEOUS) ×1 IMPLANT
PUNCH AORTIC ROTATE 5MM 8IN (MISCELLANEOUS) IMPLANT
SET MPS 3-ND DEL (MISCELLANEOUS) ×1 IMPLANT
SUPPORT HEART JANKE-BARRON (MISCELLANEOUS) ×2 IMPLANT
SUT BONE WAX W31G (SUTURE) ×2 IMPLANT
SUT MNCRL AB 4-0 PS2 18 (SUTURE) ×1 IMPLANT
SUT PROLENE 3 0 SH DA (SUTURE) ×2 IMPLANT
SUT PROLENE 4 0 RB 1 (SUTURE) ×2
SUT PROLENE 4 0 SH DA (SUTURE) IMPLANT
SUT PROLENE 4-0 RB1 .5 CRCL 36 (SUTURE) IMPLANT
SUT PROLENE 5 0 C 1 36 (SUTURE) ×1 IMPLANT
SUT PROLENE 6 0 C 1 30 (SUTURE) ×4 IMPLANT
SUT PROLENE 7 0 BV1 MDA (SUTURE) ×2 IMPLANT
SUT PROLENE 8 0 BV175 6 (SUTURE) IMPLANT
SUT STEEL 6MS V (SUTURE) ×2 IMPLANT
SUT STEEL STERNAL CCS#1 18IN (SUTURE) IMPLANT
SUT STEEL SZ 6 DBL 3X14 BALL (SUTURE) ×2 IMPLANT
SUT VIC AB 1 CTX 36 (SUTURE) ×2
SUT VIC AB 1 CTX36XBRD ANBCTR (SUTURE) ×2 IMPLANT
SUT VIC AB 2-0 CT1 27 (SUTURE) ×1
SUT VIC AB 2-0 CT1 TAPERPNT 27 (SUTURE) IMPLANT
SUT VIC AB 2-0 CTX 27 (SUTURE) IMPLANT
SUT VIC AB 3-0 SH 27 (SUTURE)
SUT VIC AB 3-0 SH 27X BRD (SUTURE) IMPLANT
SUT VIC AB 3-0 X1 27 (SUTURE) IMPLANT
SUT VICRYL 4-0 PS2 18IN ABS (SUTURE) IMPLANT
SYSTEM SAHARA CHEST DRAIN ATS (WOUND CARE) ×2 IMPLANT
TAPE CLOTH SURG 4X10 WHT LF (GAUZE/BANDAGES/DRESSINGS) ×1 IMPLANT
TOWEL GREEN STERILE (TOWEL DISPOSABLE) ×2 IMPLANT
TOWEL GREEN STERILE FF (TOWEL DISPOSABLE) ×2 IMPLANT
TRAY FOLEY SLVR 16FR TEMP STAT (SET/KITS/TRAYS/PACK) ×2 IMPLANT
TUBE CONNECTING 20X1/4 (TUBING) ×1 IMPLANT
TUBING LAP HI FLOW INSUFFLATIO (TUBING) ×2 IMPLANT
UNDERPAD 30X36 HEAVY ABSORB (UNDERPADS AND DIAPERS) ×2 IMPLANT
WATER STERILE IRR 1000ML POUR (IV SOLUTION) ×4 IMPLANT

## 2021-07-28 SURGICAL SUPPLY — 15 items
BALLN IABP SENSA PLUS 7.5F 40C (BALLOONS) ×3
BALLOON IABP SENS PLUS 7.5F40C (BALLOONS) IMPLANT
CATH OPTITORQUE TIG 4.0 5F (CATHETERS) ×1 IMPLANT
DEVICE RAD TR BAND REGULAR (VASCULAR PRODUCTS) ×1 IMPLANT
ELECT DEFIB PAD ADLT CADENCE (PAD) ×1 IMPLANT
GLIDESHEATH SLEND SS 6F .021 (SHEATH) ×1 IMPLANT
GUIDEWIRE INQWIRE 1.5J.035X260 (WIRE) IMPLANT
INQWIRE 1.5J .035X260CM (WIRE) ×3
KIT HEART LEFT (KITS) ×3 IMPLANT
PACK CARDIAC CATHETERIZATION (CUSTOM PROCEDURE TRAY) ×3 IMPLANT
SHEATH PINNACLE 6F 10CM (SHEATH) ×1 IMPLANT
TRANSDUCER W/STOPCOCK (MISCELLANEOUS) ×3 IMPLANT
TUBING CIL FLEX 10 FLL-RA (TUBING) ×3 IMPLANT
WIRE EMERALD 3MM-J .035X150CM (WIRE) ×1 IMPLANT
WIRE HI TORQ VERSACORE-J 145CM (WIRE) ×1 IMPLANT

## 2021-07-28 NOTE — Progress Notes (Signed)
  Echocardiogram Echocardiogram Transesophageal has been performed.  Marissa Lee 07/28/2021, 7:21 PM

## 2021-07-28 NOTE — H&P (View-Only) (Signed)
Reason for Consult:Left main disease Referring Physician: Dr. Ellyn Hack Tobb  Marissa Lee is an 70 y.o. female.  HPI: 70 yo woman with no prior cardiac history presented to ED with recurrent CP over past 48 hours. ECG changes noted in ED. Troponins negative. 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  She was having CP during cath, but has eased off currently.  Dr. 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   Urge incontinence 06/05/2013    Past Surgical History:  Procedure Laterality Date   ABDOMINAL HYSTERECTOMY     BLADDER SUSPENSION  2004   done with Phil Campbell   done 3x   CLOSED REDUCTION METACARPAL WITH PERCUTANEOUS PINNING Right 08/24/2016   Procedure: CLOSED REDUCTION  WITH PERCUTANEOUS PINNING;  Surgeon: Iran Planas, MD;  Location: [facility];  Service: Orthopedics;  Laterality: Right;   COLONOSCOPY     COMBINED HYSTERECTOMY VAGINAL / OOPHORECTOMY / A&P REPAIR  2004   FOOT SURGERY Right 1992   OPEN REDUCTION INTERNAL FIXATION (ORIF) HAND Right 08/24/2016   Procedure: RIGHT HAND LONG RING AND SMALL FINGER  POSSIBLE ORIF;  Surgeon: Iran Planas, MD;  Location: [facility];  Service: Orthopedics;  Laterality: Right;   WISDOM TOOTH EXTRACTION  1978    Family History  Problem Relation Age of Onset   Cancer Paternal Grandmother        Colon    Cancer Paternal Grandfather        Lung   Heart disease Mother        valvular disease, valve replacement   Atrial fibrillation Mother    Stroke Mother    Heart disease Brother        valve disease, valve surgery   Thyroid disease Maternal Grandmother        Goiter   Thyroid disease Daughter         Hypothyroid    Social History:  reports that she has quit smoking. Her smoking use included cigarettes. She has a 20.00 pack-year smoking history. She has never used smokeless tobacco. She reports current alcohol use of about 3.0 - 4.0 standard drinks per week. She reports that she does not use drugs.  Allergies:  Allergies  Allergen Reactions   Cephalosporins Anaphylaxis and Swelling    Throat swells   Clindamycin/Lincomycin     Metallic taste, blisters behind ears   Codeine Itching    Medications: Scheduled:  [MAR Hold] aspirin  324 mg Oral NOW   Or   [MAR Hold] aspirin  300 mg Rectal NOW   [MAR Hold] aspirin EC  81 mg Oral Daily   [MAR Hold] atorvastatin  80 mg Oral Daily   [START ON 07/29/2021] epinephrine  0-10 mcg/min Intravenous To OR   [MAR Hold] ezetimibe  10 mg Oral Daily   [MAR Hold] FLUoxetine  40 mg Oral Daily   [START ON 07/29/2021] heparin-papaverine-plasmalyte irrigation   Irrigation To OR   [START ON 07/29/2021] insulin   Intravenous To OR   [START ON 07/29/2021] magnesium sulfate  40 mEq Other To OR   [START ON  07/29/2021] phenylephrine  30-200 mcg/min Intravenous To OR   [START ON 07/29/2021] potassium chloride  80 mEq Other To OR   [MAR Hold] sodium chloride flush  3 mL Intravenous Q12H   [START ON 07/29/2021] tranexamic acid  15 mg/kg Intravenous To OR   [START ON 07/29/2021] tranexamic acid  2 mg/kg Intracatheter To OR    Results for orders placed or performed during the hospital encounter of 07/27/21 (from the past 48 hour(s))  Basic metabolic panel     Status: Abnormal   Collection Time: 07/27/21  5:20 PM  Result Value Ref Range   Sodium 139 135 - 145 mmol/L   Potassium 3.6 3.5 - 5.1 mmol/L   Chloride 105 98 - 111 mmol/L   CO2 27 22 - 32 mmol/L   Glucose, Bld 107 (H) 70 - 99 mg/dL    Comment: Glucose reference range applies only to samples taken after fasting for at least 8 hours.   BUN 9 8 - 23 mg/dL   Creatinine, Ser 0.96 0.44 - 1.00 mg/dL    Calcium 9.0 8.9 - 10.3 mg/dL   GFR, Estimated >60 >60 mL/min    Comment: (NOTE) Calculated using the CKD-EPI Creatinine Equation (2021)    Anion gap 7 5 - 15    Comment: Performed at [facility] [address], [place] [address]  CBC     Status: None   Collection Time: 07/27/21  5:20 PM  Result Value Ref Range   WBC 5.9 4.0 - 10.5 K/uL   RBC 4.54 3.87 - 5.11 MIL/uL   Hemoglobin 14.6 12.0 - 15.0 g/dL   HCT 43.6 36.0 - 46.0 %   MCV 96.0 80.0 - 100.0 fL   MCH 32.2 26.0 - 34.0 pg   MCHC 33.5 30.0 - 36.0 g/dL   RDW 12.8 11.5 - 15.5 %   Platelets 320 150 - 400 K/uL   nRBC 0.0 0.0 - 0.2 %    Comment: Performed at [facility], [place] [address] [place], [place] [address]  Troponin I (High Sensitivity)     Status: None   Collection Time: 07/27/21  5:20 PM  Result Value Ref Range   Troponin I (High Sensitivity) 14 <18 ng/L    Comment: (NOTE) Elevated high sensitivity troponin I (hsTnI) values and significant  changes across serial measurements may suggest ACS but many other  chronic and acute conditions are known to elevate hsTnI results.  Refer to the "Links" section for chest pain algorithms and additional  guidance. Performed at [facility], [place] [address] [place], [place] [address]   Resp Panel by RT-PCR (Flu A&B, Covid) Nasopharyngeal Swab     Status: Abnormal   Collection Time: 07/27/21  5:57 PM   Specimen: Nasopharyngeal Swab; Nasopharyngeal(NP) swabs in vial transport medium  Result Value Ref Range   SARS Coronavirus 2 by RT PCR POSITIVE (A) NEGATIVE    Comment: RESULT CALLED TO, READ BACK BY AND VERIFIED WITH: TORI DOOSON RN  07/27/2021 @2032  BY JW  (NOTE) SARS-CoV-2 target nucleic acids are DETECTED.  The SARS-CoV-2 RNA is generally detectable in upper respiratory specimens during the acute phase of infection. Positive results are indicative of the presence of the identified virus, but do not rule out bacterial infection or co-infection  with other pathogens not detected by the test. Clinical correlation with patient history and other diagnostic information is necessary to determine patient infection status. The expected result is Negative.  Fact Sheet for  Patients: EntrepreneurPulse.com.au  Fact Sheet for Healthcare Providers: IncredibleEmployment.be  This test is not yet approved or cleared by the [place] FDA and  has been authorized for detection and/or diagnosis of SARS-CoV-2 by FDA under an Emergency Use Authorization (EUA).  This EUA will remain in effect (meaning this test  can be used) for the duration of  the COVID-19 declaration under Section 564(b)(1) of the Act, 21 U.S.C. section 360bbb-3(b)(1), unless the authorization is terminated or revoked sooner.     Influenza A by PCR NEGATIVE NEGATIVE   Influenza B by PCR NEGATIVE NEGATIVE    Comment: (NOTE) The Xpert Xpress SARS-CoV-2/FLU/RSV plus assay is intended as an aid in the diagnosis of influenza from Nasopharyngeal swab specimens and should not be used as a sole basis for treatment. Nasal washings and aspirates are unacceptable for Xpert Xpress SARS-CoV-2/FLU/RSV testing.  Fact Sheet for Patients: EntrepreneurPulse.com.au  Fact Sheet for Healthcare Providers: IncredibleEmployment.be  This test is not yet approved or cleared by the [place] FDA and has been authorized for detection and/or diagnosis of SARS-CoV-2 by FDA under an Emergency Use Authorization (EUA). This EUA will remain in effect (meaning this test can be used) for the duration of the COVID-19 declaration under Section 564(b)(1) of the Act, 21 U.S.C. section 360bbb-3(b)(1), unless the authorization is terminated or revoked.  Performed at [facility], [place] [address] [place], [place] [address]   Troponin I (High Sensitivity)     Status: None   Collection Time: 07/27/21  7:48 PM  Result Value  Ref Range   Troponin I (High Sensitivity) 14 <18 ng/L    Comment: (NOTE) Elevated high sensitivity troponin I (hsTnI) values and significant  changes across serial measurements may suggest ACS but many other  chronic and acute conditions are known to elevate hsTnI results.  Refer to the "Links" section for chest pain algorithms and additional  guidance. Performed at [facility], [place] [address], [place], [place] [address]   HIV Antibody (routine testing w rflx)     Status: None   Collection Time: 07/27/21  7:48 PM  Result Value Ref Range   HIV Screen 4th Generation wRfx Non Reactive Non Reactive    Comment: Performed at [facility], [place] [address], [place], [place] [address]  Magnesium     Status: None   Collection Time: 07/27/21  7:48 PM  Result Value Ref Range   Magnesium 2.0 1.7 - 2.4 mg/dL    Comment: Performed at [facility], [place] [address], [place], [place] [address]  TSH     Status: None   Collection Time: 07/27/21  7:48 PM  Result Value Ref Range   TSH 3.643 0.350 - 4.500 uIU/mL    Comment: Performed by a 3rd Generation assay with a functional sensitivity of <=0.01 uIU/mL. Performed at [facility], [place] [address] [place], [place] [address]   T4, free     Status: None   Collection Time: 07/27/21  7:48 PM  Result Value Ref Range   Free T4 0.93 0.61 - 1.12 ng/dL    Comment: (NOTE) Biotin ingestion may interfere with free T4 tests. If the results are inconsistent with the TSH level, previous test results, or the clinical presentation, then consider biotin interference. If needed, order repeat testing after stopping biotin. Performed at [facility], [place] [address], [place], [place] [address]   Basic metabolic panel     Status: Abnormal   Collection  Time: 07/28/21  2:38 AM  Result Value Ref Range   Sodium 137 135 - 145 mmol/L   Potassium 3.5 3.5 - 5.1 mmol/L   Chloride 105 98 - 111 mmol/L   CO2 24 22 - 32 mmol/L   Glucose, Bld 97 70  - 99 mg/dL    Comment: Glucose reference range applies only to samples taken after fasting for at least 8 hours.   BUN 9 8 - 23 mg/dL   Creatinine, Ser 0.75 0.44 - 1.00 mg/dL   Calcium 8.6 (L) 8.9 - 10.3 mg/dL   GFR, Estimated >60 >60 mL/min    Comment: (NOTE) Calculated using the CKD-EPI Creatinine Equation (2021)    Anion gap 8 5 - 15    Comment: Performed at [facility] [address] [place], [place] [address]  Lipid panel     Status: Abnormal   Collection Time: 07/28/21  2:38 AM  Result Value Ref Range   Cholesterol 236 (H) 0 - 200 mg/dL   Triglycerides 144 <150 mg/dL   HDL 49 >40 mg/dL   Total CHOL/HDL Ratio 4.8 RATIO   VLDL 29 0 - 40 mg/dL   LDL Cholesterol 158 (H) 0 - 99 mg/dL    Comment:        Total Cholesterol/HDL:CHD Risk Coronary Heart Disease Risk Table                     Men   Women  1/2 Average Risk   3.4   3.3  Average Risk       5.0   4.4  2 X Average Risk   9.6   7.1  3 X Average Risk  23.4   11.0        Use the calculated Patient Ratio above and the CHD Risk Table to determine the patient's CHD Risk.        ATP III CLASSIFICATION (LDL):  <100     mg/dL   Optimal  100-129  mg/dL   Near or Above                    Optimal  130-159  mg/dL   Borderline  160-189  mg/dL   High  >190     mg/dL   Very High Performed at [facility] [address], [place] [address]   CBC     Status: None   Collection Time: 07/28/21  2:38 AM  Result Value Ref Range   WBC 6.0 4.0 - 10.5 K/uL   RBC 4.12 3.87 - 5.11 MIL/uL   Hemoglobin 13.0 12.0 - 15.0 g/dL   HCT 40.1 36.0 - 46.0 %   MCV 97.3 80.0 - 100.0 fL   MCH 31.6 26.0 - 34.0 pg   MCHC 32.4 30.0 - 36.0 g/dL   RDW 12.9 11.5 - 15.5 %   Platelets 289 150 - 400 K/uL   nRBC 0.0 0.0 - 0.2 %    Comment: Performed at [facility], [place] [address] [place], [place] [address]  Protime-INR     Status: None   Collection Time: 07/28/21  2:38 AM  Result Value Ref Range   Prothrombin Time 13.5  11.4 - 15.2 seconds   INR 1.0 0.8 - 1.2    Comment: (NOTE) INR goal varies based on device and disease states. Performed at [facility], [place] [address] [place], [place] [address]   Heparin level (unfractionated)  Status: None   Collection Time: 07/28/21  2:38 AM  Result Value Ref Range   Heparin Unfractionated 0.36 0.30 - 0.70 IU/mL    Comment: (NOTE) The clinical reportable range upper limit is being lowered to >1.10 to align with the FDA approved guidance for the current laboratory assay.  If heparin results are below expected values, and patient dosage has  been confirmed, suggest follow up testing of antithrombin III levels. Performed at [facility], [place] [address] [place], [place] [address]   Hemoglobin A1c     Status: None   Collection Time: 07/28/21 10:34 AM  Result Value Ref Range   Hgb A1c MFr Bld 5.2 4.8 - 5.6 %    Comment: (NOTE) Pre diabetes:          5.7%-6.4%  Diabetes:              >6.4%  Glycemic control for   <7.0% adults with diabetes    Mean Plasma Glucose 102.54 mg/dL    Comment: Performed at [facility] [address] [place], [place] [address]  Heparin level (unfractionated)     Status: None   Collection Time: 07/28/21 10:34 AM  Result Value Ref Range   Heparin Unfractionated 0.32 0.30 - 0.70 IU/mL    Comment: (NOTE) The clinical reportable range upper limit is being lowered to >1.10 to align with the FDA approved guidance for the current laboratory assay.  If heparin results are below expected values, and patient dosage has  been confirmed, suggest follow up testing of antithrombin III levels. Performed at [facility], [place] [address], [place], [place] [address]     DG Chest 2 View  Result Date: 07/27/2021 CLINICAL DATA:  Chest pain. EXAM: CHEST - 2 VIEW COMPARISON:  Chest radiograph dated 10/10/2016. FINDINGS: The heart size and mediastinal contours are within normal limits. Both lungs are clear. The  visualized skeletal structures are unremarkable. IMPRESSION: No active cardiopulmonary disease. Electronically Signed   By: Anner Crete M.D.   On: 07/27/2021 19:09   ECHOCARDIOGRAM COMPLETE  Result Date: 07/28/2021    ECHOCARDIOGRAM REPORT   Patient Name:   Marissa Lee Date of Exam: 07/28/2021 Medical Rec #:  [id number]    Height:       64.0 in Accession #:    [id number]   Weight:       140.0 lb Date of Birth:  Aug 16, 1951    BSA:          1.681 m Patient Age:    38 years     BP:           134/61 mmHg Patient Gender: F            HR:           57 bpm. Exam Location:  Inpatient Procedure: 2D Echo, Cardiac Doppler and Color Doppler Indications:    R07.9* Chest pain, unspecified  History:        Patient has prior history of Echocardiogram examinations, most                 recent 01/29/2016.  Sonographer:    Bernadene Person RDCS Referring Phys: [place]  1. Left ventricular ejection fraction, by estimation, is 60 to 65%. The left ventricle has normal function. The left ventricle has no regional wall motion abnormalities. Left ventricular diastolic parameters are indeterminate.  2. Right ventricular systolic function is normal. The right ventricular size is normal.  There is normal pulmonary artery systolic pressure.  3. Left atrial size was mildly dilated.  4. The mitral valve is normal in structure. No evidence of mitral valve regurgitation.  5. The aortic valve is normal in structure. Aortic valve regurgitation is not visualized. No aortic stenosis is present.  6. The inferior vena cava is normal in size with greater than 50% respiratory variability, suggesting right atrial pressure of 3 mmHg. Comparison(s): No prior Echocardiogram. Conclusion(s)/Recommendation(s): Normal biventricular function without evidence of hemodynamically significant valvular heart disease. FINDINGS  Left Ventricle: Left ventricular ejection fraction, by estimation, is 60 to 65%. The left ventricle has normal  function. The left ventricle has no regional wall motion abnormalities. The left ventricular internal cavity size was normal in size. There is  no left ventricular hypertrophy. Left ventricular diastolic parameters are indeterminate. Right Ventricle: The right ventricular size is normal. No increase in right ventricular wall thickness. Right ventricular systolic function is normal. There is normal pulmonary artery systolic pressure. The tricuspid regurgitant velocity is 1.75 m/s, and  with an assumed right atrial pressure of 3 mmHg, the estimated right ventricular systolic pressure is 32.2 mmHg. Left Atrium: Left atrial size was mildly dilated. Right Atrium: Right atrial size was normal in size. Pericardium: There is no evidence of pericardial effusion. Mitral Valve: The mitral valve is normal in structure. No evidence of mitral valve regurgitation. Tricuspid Valve: The tricuspid valve is normal in structure. Tricuspid valve regurgitation is not demonstrated. Aortic Valve: The aortic valve is normal in structure. Aortic valve regurgitation is not visualized. No aortic stenosis is present. Pulmonic Valve: The pulmonic valve was not well visualized. Pulmonic valve regurgitation is not visualized. Aorta: The aortic root and ascending aorta are structurally normal, with no evidence of dilitation. Venous: The inferior vena cava is normal in size with greater than 50% respiratory variability, suggesting right atrial pressure of 3 mmHg. IAS/Shunts: No atrial level shunt detected by color flow Doppler.  LEFT VENTRICLE PLAX 2D LVIDd:         4.00 cm   Diastology LVIDs:         2.20 cm   LV e' medial:    5.75 cm/s LV PW:         0.90 cm   LV E/e' medial:  14.9 LV IVS:        0.60 cm   LV e' lateral:   8.37 cm/s LVOT diam:     2.10 cm   LV E/e' lateral: 10.3 LV SV:         78 LV SV Index:   47 LVOT Area:     3.46 cm  RIGHT VENTRICLE RV S prime:     13.40 cm/s TAPSE (M-mode): 1.8 cm LEFT ATRIUM             Index        RIGHT  ATRIUM           Index LA diam:        3.50 cm 2.08 cm/m   RA Area:     13.60 cm LA Vol (A2C):   37.9 ml 22.54 ml/m  RA Volume:   30.30 ml  18.02 ml/m LA Vol (A4C):   44.1 ml 26.23 ml/m LA Biplane Vol: 42.9 ml 25.52 ml/m  AORTIC VALVE LVOT Vmax:   101.00 cm/s LVOT Vmean:  66.400 cm/s LVOT VTI:    0.226 m  AORTA Ao Root diam: 2.90 cm Ao Asc diam:  2.80 cm MITRAL VALVE  TRICUSPID VALVE MV Area (PHT): 4.68 cm    TR Peak grad:   12.2 mmHg MV Decel Time: 162 msec    TR Vmax:        175.00 cm/s MV E velocity: 85.90 cm/s MV A velocity: 64.80 cm/s  SHUNTS MV E/A ratio:  1.33        Systemic VTI:  0.23 m                            Systemic Diam: 2.10 cm Phineas Inches Electronically signed by Phineas Inches Signature Date/Time: 07/28/2021/3:40:50 PM    Final     Review of Systems Blood pressure (!) 151/67, pulse 61, temperature 97.9 F (36.6 C), temperature source Oral, resp. rate 16, height 5\' 4"  (1.626 m), weight 63.5 kg, last menstrual period 09/27/2003, SpO2 100 %. Physical Exam Constitutional:      Appearance: Normal appearance.  HENT:     Head: Normocephalic and atraumatic.  Neck:     Vascular: No carotid bruit.  Musculoskeletal:        General: No swelling.  Neurological:     General: No focal deficit present.     Mental Status: She is alert and oriented to person, place, and time.     Cranial Nerves: No cranial nerve deficit.   Exam limited as she is on cath table and draped  Assessment/Plan: 70 yo woman with no prior history of CAD presents with 48 hours of waxing and waning CP. At cath has critical ostial left main stenosis. Urgent CABG indicated for survival benefit and relief of ischemia.  I discussed the indications, risks, benefits and alternatives with Mrs. Starkman and her daughter separately.  I informed them of the risks including but not limited death, MI, DVT, stroke, infection, bleeding as well as other unforeseeable complications. She agrees to proceed.  OR has been  notified.  [name] 07/28/2021, 5:46 PM

## 2021-07-28 NOTE — Consult Note (Signed)
Reason for Consult:Left main disease Referring Physician: Dr. Ellyn Hack Tobb  Marissa Lee is an 70 y.o. female.  HPI: 70 yo woman with no prior cardiac history presented to ED with recurrent CP over past 48 hours. ECG changes noted in ED. Troponins negative. 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  She was having CP during cath, but has eased off currently.  Dr. 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   Urge incontinence 06/05/2013    Past Surgical History:  Procedure Laterality Date   ABDOMINAL HYSTERECTOMY     BLADDER SUSPENSION  2004   done with [place]   done 3x   CLOSED REDUCTION METACARPAL WITH PERCUTANEOUS PINNING Right 08/24/2016   Procedure: CLOSED REDUCTION  WITH PERCUTANEOUS PINNING;  Surgeon: Iran Planas, MD;  Location: [facility];  Service: Orthopedics;  Laterality: Right;   COLONOSCOPY     COMBINED HYSTERECTOMY VAGINAL / OOPHORECTOMY / A&P REPAIR  2004   FOOT SURGERY Right 1992   OPEN REDUCTION INTERNAL FIXATION (ORIF) HAND Right 08/24/2016   Procedure: RIGHT HAND LONG RING AND SMALL FINGER  POSSIBLE ORIF;  Surgeon: Iran Planas, MD;  Location: [facility];  Service: Orthopedics;  Laterality: Right;   WISDOM TOOTH EXTRACTION  1978    Family History  Problem Relation Age of Onset   Cancer Paternal Grandmother        Colon    Cancer Paternal Grandfather        Lung   Heart disease Mother        valvular disease, valve replacement   Atrial fibrillation Mother    Stroke Mother    Heart disease Brother        valve disease, valve surgery   Thyroid disease Maternal Grandmother        Goiter   Thyroid disease Daughter         Hypothyroid    Social History:  reports that she has quit smoking. Her smoking use included cigarettes. She has a 20.00 pack-year smoking history. She has never used smokeless tobacco. She reports current alcohol use of about 3.0 - 4.0 standard drinks per week. She reports that she does not use drugs.  Allergies:  Allergies  Allergen Reactions   Cephalosporins Anaphylaxis and Swelling    Throat swells   Clindamycin/Lincomycin     Metallic taste, blisters behind ears   Codeine Itching    Medications: Scheduled:  [MAR Hold] aspirin  324 mg Oral NOW   Or   [MAR Hold] aspirin  300 mg Rectal NOW   [MAR Hold] aspirin EC  81 mg Oral Daily   [MAR Hold] atorvastatin  80 mg Oral Daily   [START ON 07/29/2021] epinephrine  0-10 mcg/min Intravenous To OR   [MAR Hold] ezetimibe  10 mg Oral Daily   [MAR Hold] FLUoxetine  40 mg Oral Daily   [START ON 07/29/2021] heparin-papaverine-plasmalyte irrigation   Irrigation To OR   [START ON 07/29/2021] insulin   Intravenous To OR   [START ON 07/29/2021] magnesium sulfate  40 mEq Other To OR   [START ON  07/29/2021] phenylephrine  30-200 mcg/min Intravenous To OR   [START ON 07/29/2021] potassium chloride  80 mEq Other To OR   [MAR Hold] sodium chloride flush  3 mL Intravenous Q12H   [START ON 07/29/2021] tranexamic acid  15 mg/kg Intravenous To OR   [START ON 07/29/2021] tranexamic acid  2 mg/kg Intracatheter To OR    Results for orders placed or performed during the hospital encounter of 07/27/21 (from the past 48 hour(s))  Basic metabolic panel     Status: Abnormal   Collection Time: 07/27/21  5:20 PM  Result Value Ref Range   Sodium 139 135 - 145 mmol/L   Potassium 3.6 3.5 - 5.1 mmol/L   Chloride 105 98 - 111 mmol/L   CO2 27 22 - 32 mmol/L   Glucose, Bld 107 (H) 70 - 99 mg/dL    Comment: Glucose reference range applies only to samples taken after fasting for at least 8 hours.   BUN 9 8 - 23 mg/dL   Creatinine, Ser 0.96 0.44 - 1.00 mg/dL    Calcium 9.0 8.9 - 10.3 mg/dL   GFR, Estimated >60 >60 mL/min    Comment: (NOTE) Calculated using the CKD-EPI Creatinine Equation (2021)    Anion gap 7 5 - 15    Comment: Performed at [facility] [address] [place] [address]  CBC     Status: None   Collection Time: 07/27/21  5:20 PM  Result Value Ref Range   WBC 5.9 4.0 - 10.5 K/uL   RBC 4.54 3.87 - 5.11 MIL/uL   Hemoglobin 14.6 12.0 - 15.0 g/dL   HCT 43.6 36.0 - 46.0 %   MCV 96.0 80.0 - 100.0 fL   MCH 32.2 26.0 - 34.0 pg   MCHC 33.5 30.0 - 36.0 g/dL   RDW 12.8 11.5 - 15.5 %   Platelets 320 150 - 400 K/uL   nRBC 0.0 0.0 - 0.2 %    Comment: Performed at [facility], [place] [address], [place], [place] [address]  Troponin I (High Sensitivity)     Status: None   Collection Time: 07/27/21  5:20 PM  Result Value Ref Range   Troponin I (High Sensitivity) 14 <18 ng/L    Comment: (NOTE) Elevated high sensitivity troponin I (hsTnI) values and significant  changes across serial measurements may suggest ACS but many other  chronic and acute conditions are known to elevate hsTnI results.  Refer to the "Links" section for chest pain algorithms and additional  guidance. Performed at [facility], [place] [address] [place], [place] [address]   Resp Panel by RT-PCR (Flu A&B, Covid) Nasopharyngeal Swab     Status: Abnormal   Collection Time: 07/27/21  5:57 PM   Specimen: Nasopharyngeal Swab; Nasopharyngeal(NP) swabs in vial transport medium  Result Value Ref Range   SARS Coronavirus 2 by RT PCR POSITIVE (A) NEGATIVE    Comment: RESULT CALLED TO, READ BACK BY AND VERIFIED WITH: TORI DOOSON RN  07/27/2021 @2032  BY JW  (NOTE) SARS-CoV-2 target nucleic acids are DETECTED.  The SARS-CoV-2 RNA is generally detectable in upper respiratory specimens during the acute phase of infection. Positive results are indicative of the presence of the identified virus, but do not rule out bacterial infection or co-infection  with other pathogens not detected by the test. Clinical correlation with patient history and other diagnostic information is necessary to determine patient infection status. The expected result is Negative.  Fact Sheet for  Patients: EntrepreneurPulse.com.au  Fact Sheet for Healthcare Providers: IncredibleEmployment.be  This test is not yet approved or cleared by the [place] FDA and  has been authorized for detection and/or diagnosis of SARS-CoV-2 by FDA under an Emergency Use Authorization (EUA).  This EUA will remain in effect (meaning this test  can be used) for the duration of  the COVID-19 declaration under Section 564(b)(1) of the Act, 21 U.S.C. section 360bbb-3(b)(1), unless the authorization is terminated or revoked sooner.     Influenza A by PCR NEGATIVE NEGATIVE   Influenza B by PCR NEGATIVE NEGATIVE    Comment: (NOTE) The Xpert Xpress SARS-CoV-2/FLU/RSV plus assay is intended as an aid in the diagnosis of influenza from Nasopharyngeal swab specimens and should not be used as a sole basis for treatment. Nasal washings and aspirates are unacceptable for Xpert Xpress SARS-CoV-2/FLU/RSV testing.  Fact Sheet for Patients: EntrepreneurPulse.com.au  Fact Sheet for Healthcare Providers: IncredibleEmployment.be  This test is not yet approved or cleared by the [place] FDA and has been authorized for detection and/or diagnosis of SARS-CoV-2 by FDA under an Emergency Use Authorization (EUA). This EUA will remain in effect (meaning this test can be used) for the duration of the COVID-19 declaration under Section 564(b)(1) of the Act, 21 U.S.C. section 360bbb-3(b)(1), unless the authorization is terminated or revoked.  Performed at [facility], [place] [address], [place], [place] [address]   Troponin I (High Sensitivity)     Status: None   Collection Time: 07/27/21  7:48 PM  Result Value  Ref Range   Troponin I (High Sensitivity) 14 <18 ng/L    Comment: (NOTE) Elevated high sensitivity troponin I (hsTnI) values and significant  changes across serial measurements may suggest ACS but many other  chronic and acute conditions are known to elevate hsTnI results.  Refer to the "Links" section for chest pain algorithms and additional  guidance. Performed at [facility], [place] [address] [place], [place] [address]   HIV Antibody (routine testing w rflx)     Status: None   Collection Time: 07/27/21  7:48 PM  Result Value Ref Range   HIV Screen 4th Generation wRfx Non Reactive Non Reactive    Comment: Performed at [facility], [place] [address], [place], [place] [address]  Magnesium     Status: None   Collection Time: 07/27/21  7:48 PM  Result Value Ref Range   Magnesium 2.0 1.7 - 2.4 mg/dL    Comment: Performed at [facility], [place] [address], [place], [place] [address]  TSH     Status: None   Collection Time: 07/27/21  7:48 PM  Result Value Ref Range   TSH 3.643 0.350 - 4.500 uIU/mL    Comment: Performed by a 3rd Generation assay with a functional sensitivity of <=0.01 uIU/mL. Performed at [facility], [place] [address], [place], [place] [address]   T4, free     Status: None   Collection Time: 07/27/21  7:48 PM  Result Value Ref Range   Free T4 0.93 0.61 - 1.12 ng/dL    Comment: (NOTE) Biotin ingestion may interfere with free T4 tests. If the results are inconsistent with the TSH level, previous test results, or the clinical presentation, then consider biotin interference. If needed, order repeat testing after stopping biotin. Performed at [facility], [place] [address], [place], [place] [address]   Basic metabolic panel     Status: Abnormal   Collection  Time: 07/28/21  2:38 AM  Result Value Ref Range   Sodium 137 135 - 145 mmol/L   Potassium 3.5 3.5 - 5.1 mmol/L   Chloride 105 98 - 111 mmol/L   CO2 24 22 - 32 mmol/L   Glucose, Bld 97 70  - 99 mg/dL    Comment: Glucose reference range applies only to samples taken after fasting for at least 8 hours.   BUN 9 8 - 23 mg/dL   Creatinine, Ser 0.75 0.44 - 1.00 mg/dL   Calcium 8.6 (L) 8.9 - 10.3 mg/dL   GFR, Estimated >60 >60 mL/min    Comment: (NOTE) Calculated using the CKD-EPI Creatinine Equation (2021)    Anion gap 8 5 - 15    Comment: Performed at [facility] [address] [place], [place] [address]  Lipid panel     Status: Abnormal   Collection Time: 07/28/21  2:38 AM  Result Value Ref Range   Cholesterol 236 (H) 0 - 200 mg/dL   Triglycerides 144 <150 mg/dL   HDL 49 >40 mg/dL   Total CHOL/HDL Ratio 4.8 RATIO   VLDL 29 0 - 40 mg/dL   LDL Cholesterol 158 (H) 0 - 99 mg/dL    Comment:        Total Cholesterol/HDL:CHD Risk Coronary Heart Disease Risk Table                     Men   Women  1/2 Average Risk   3.4   3.3  Average Risk       5.0   4.4  2 X Average Risk   9.6   7.1  3 X Average Risk  23.4   11.0        Use the calculated Patient Ratio above and the CHD Risk Table to determine the patient's CHD Risk.        ATP III CLASSIFICATION (LDL):  <100     mg/dL   Optimal  100-129  mg/dL   Near or Above                    Optimal  130-159  mg/dL   Borderline  160-189  mg/dL   High  >190     mg/dL   Very High Performed at [facility] [address] [place] [address]   CBC     Status: None   Collection Time: 07/28/21  2:38 AM  Result Value Ref Range   WBC 6.0 4.0 - 10.5 K/uL   RBC 4.12 3.87 - 5.11 MIL/uL   Hemoglobin 13.0 12.0 - 15.0 g/dL   HCT 40.1 36.0 - 46.0 %   MCV 97.3 80.0 - 100.0 fL   MCH 31.6 26.0 - 34.0 pg   MCHC 32.4 30.0 - 36.0 g/dL   RDW 12.9 11.5 - 15.5 %   Platelets 289 150 - 400 K/uL   nRBC 0.0 0.0 - 0.2 %    Comment: Performed at [facility], [place] [address] [place], [place] [address]  Protime-INR     Status: None   Collection Time: 07/28/21  2:38 AM  Result Value Ref Range   Prothrombin Time 13.5  11.4 - 15.2 seconds   INR 1.0 0.8 - 1.2    Comment: (NOTE) INR goal varies based on device and disease states. Performed at [facility], [place] [address], [place], [place] [address]   Heparin level (unfractionated)  Status: None   Collection Time: 07/28/21  2:38 AM  Result Value Ref Range   Heparin Unfractionated 0.36 0.30 - 0.70 IU/mL    Comment: (NOTE) The clinical reportable range upper limit is being lowered to >1.10 to align with the FDA approved guidance for the current laboratory assay.  If heparin results are below expected values, and patient dosage has  been confirmed, suggest follow up testing of antithrombin III levels. Performed at [facility], [place] [address], [place], [place] [address]   Hemoglobin A1c     Status: None   Collection Time: 07/28/21 10:34 AM  Result Value Ref Range   Hgb A1c MFr Bld 5.2 4.8 - 5.6 %    Comment: (NOTE) Pre diabetes:          5.7%-6.4%  Diabetes:              >6.4%  Glycemic control for   <7.0% adults with diabetes    Mean Plasma Glucose 102.54 mg/dL    Comment: Performed at [facility] [address], [place], [place] [address]  Heparin level (unfractionated)     Status: None   Collection Time: 07/28/21 10:34 AM  Result Value Ref Range   Heparin Unfractionated 0.32 0.30 - 0.70 IU/mL    Comment: (NOTE) The clinical reportable range upper limit is being lowered to >1.10 to align with the FDA approved guidance for the current laboratory assay.  If heparin results are below expected values, and patient dosage has  been confirmed, suggest follow up testing of antithrombin III levels. Performed at [facility], [place] [address], [place], [place] [address]     DG Chest 2 View  Result Date: 07/27/2021 CLINICAL DATA:  Chest pain. EXAM: CHEST - 2 VIEW COMPARISON:  Chest radiograph dated 10/10/2016. FINDINGS: The heart size and mediastinal contours are within normal limits. Both lungs are clear. The  visualized skeletal structures are unremarkable. IMPRESSION: No active cardiopulmonary disease. Electronically Signed   By: Anner Crete M.D.   On: 07/27/2021 19:09   ECHOCARDIOGRAM COMPLETE  Result Date: 07/28/2021    ECHOCARDIOGRAM REPORT   Patient Name:   Marissa Lee Date of Exam: 07/28/2021 Medical Rec #:  [id number]    Height:       64.0 in Accession #:    [id number]   Weight:       140.0 lb Date of Birth:  Jul 03, 1951    BSA:          1.681 m Patient Age:    25 years     BP:           134/61 mmHg Patient Gender: F            HR:           57 bpm. Exam Location:  Inpatient Procedure: 2D Echo, Cardiac Doppler and Color Doppler Indications:    R07.9* Chest pain, unspecified  History:        Patient has prior history of Echocardiogram examinations, most                 recent 01/29/2016.  Sonographer:    Bernadene Person RDCS Referring Phys: [place]  1. Left ventricular ejection fraction, by estimation, is 60 to 65%. The left ventricle has normal function. The left ventricle has no regional wall motion abnormalities. Left ventricular diastolic parameters are indeterminate.  2. Right ventricular systolic function is normal. The right ventricular size is normal.  There is normal pulmonary artery systolic pressure.  3. Left atrial size was mildly dilated.  4. The mitral valve is normal in structure. No evidence of mitral valve regurgitation.  5. The aortic valve is normal in structure. Aortic valve regurgitation is not visualized. No aortic stenosis is present.  6. The inferior vena cava is normal in size with greater than 50% respiratory variability, suggesting right atrial pressure of 3 mmHg. Comparison(s): No prior Echocardiogram. Conclusion(s)/Recommendation(s): Normal biventricular function without evidence of hemodynamically significant valvular heart disease. FINDINGS  Left Ventricle: Left ventricular ejection fraction, by estimation, is 60 to 65%. The left ventricle has normal  function. The left ventricle has no regional wall motion abnormalities. The left ventricular internal cavity size was normal in size. There is  no left ventricular hypertrophy. Left ventricular diastolic parameters are indeterminate. Right Ventricle: The right ventricular size is normal. No increase in right ventricular wall thickness. Right ventricular systolic function is normal. There is normal pulmonary artery systolic pressure. The tricuspid regurgitant velocity is 1.75 m/s, and  with an assumed right atrial pressure of 3 mmHg, the estimated right ventricular systolic pressure is 16.1 mmHg. Left Atrium: Left atrial size was mildly dilated. Right Atrium: Right atrial size was normal in size. Pericardium: There is no evidence of pericardial effusion. Mitral Valve: The mitral valve is normal in structure. No evidence of mitral valve regurgitation. Tricuspid Valve: The tricuspid valve is normal in structure. Tricuspid valve regurgitation is not demonstrated. Aortic Valve: The aortic valve is normal in structure. Aortic valve regurgitation is not visualized. No aortic stenosis is present. Pulmonic Valve: The pulmonic valve was not well visualized. Pulmonic valve regurgitation is not visualized. Aorta: The aortic root and ascending aorta are structurally normal, with no evidence of dilitation. Venous: The inferior vena cava is normal in size with greater than 50% respiratory variability, suggesting right atrial pressure of 3 mmHg. IAS/Shunts: No atrial level shunt detected by color flow Doppler.  LEFT VENTRICLE PLAX 2D LVIDd:         4.00 cm   Diastology LVIDs:         2.20 cm   LV e' medial:    5.75 cm/s LV PW:         0.90 cm   LV E/e' medial:  14.9 LV IVS:        0.60 cm   LV e' lateral:   8.37 cm/s LVOT diam:     2.10 cm   LV E/e' lateral: 10.3 LV SV:         78 LV SV Index:   47 LVOT Area:     3.46 cm  RIGHT VENTRICLE RV S prime:     13.40 cm/s TAPSE (M-mode): 1.8 cm LEFT ATRIUM             Index        RIGHT  ATRIUM           Index LA diam:        3.50 cm 2.08 cm/m   RA Area:     13.60 cm LA Vol (A2C):   37.9 ml 22.54 ml/m  RA Volume:   30.30 ml  18.02 ml/m LA Vol (A4C):   44.1 ml 26.23 ml/m LA Biplane Vol: 42.9 ml 25.52 ml/m  AORTIC VALVE LVOT Vmax:   101.00 cm/s LVOT Vmean:  66.400 cm/s LVOT VTI:    0.226 m  AORTA Ao Root diam: 2.90 cm Ao Asc diam:  2.80 cm MITRAL VALVE  TRICUSPID VALVE MV Area (PHT): 4.68 cm    TR Peak grad:   12.2 mmHg MV Decel Time: 162 msec    TR Vmax:        175.00 cm/s MV E velocity: 85.90 cm/s MV A velocity: 64.80 cm/s  SHUNTS MV E/A ratio:  1.33        Systemic VTI:  0.23 m                            Systemic Diam: 2.10 cm Phineas Inches Electronically signed by Phineas Inches Signature Date/Time: 07/28/2021/3:40:50 PM    Final     Review of Systems Blood pressure (!) 151/67, pulse 61, temperature 97.9 F (36.6 C), temperature source Oral, resp. rate 16, height 5\' 4"  (1.626 m), weight 63.5 kg, last menstrual period 09/27/2003, SpO2 100 %. Physical Exam Constitutional:      Appearance: Normal appearance.  HENT:     Head: Normocephalic and atraumatic.  Neck:     Vascular: No carotid bruit.  Musculoskeletal:        General: No swelling.  Neurological:     General: No focal deficit present.     Mental Status: She is alert and oriented to person, place, and time.     Cranial Nerves: No cranial nerve deficit.   Exam limited as she is on cath table and draped  Assessment/Plan: 70 yo woman with no prior history of CAD presents with 48 hours of waxing and waning CP. At cath has critical ostial left main stenosis. Urgent CABG indicated for survival benefit and relief of ischemia.  I discussed the indications, risks, benefits and alternatives with Marissa Lee and her daughter separately.  I informed them of the risks including but not limited death, MI, DVT, stroke, infection, bleeding as well as other unforeseeable complications. She agrees to proceed.  OR has been  notified.  [name] 07/28/2021, 5:46 PM

## 2021-07-28 NOTE — Anesthesia Procedure Notes (Addendum)
  Central Venous Catheter Insertion Performed by: Belinda Block, MD, anesthesiologist Start/End11/10/2020 6:25 PM, 07/28/2021 6:40 PM Patient location: Pre-op. Preanesthetic checklist: patient identified, IV checked, site marked, risks and benefits discussed, surgical consent, monitors and equipment checked, pre-op evaluation, timeout performed and anesthesia consent Position: Trendelenburg Lidocaine 1% used for infiltration and patient sedated Hand hygiene performed , maximum sterile barriers used  and Seldinger technique used Catheter size: 8.5 Fr Sheath introducer Procedure performed using ultrasound guided technique. Ultrasound Notes:anatomy identified, needle tip was noted to be adjacent to the nerve/plexus identified, no ultrasound evidence of intravascular and/or intraneural injection and image(s) printed for medical record Attempts: 1 Following insertion, line sutured and dressing applied. Post procedure assessment: blood return through all ports, free fluid flow and no air  Patient tolerated the procedure well with no immediate complications. Additional procedure comments: Flow track inserted without difficulty. Dr. Nyoka Cowden.

## 2021-07-28 NOTE — Interval H&P Note (Signed)
History and Physical Interval Note:  COVID +, per chart had Covid earlier in October  07/28/2021 6:06 PM  Marissa Lee  has presented today for surgery, with the diagnosis of CAD.  The various methods of treatment have been discussed with the patient and family. After consideration of risks, benefits and other options for treatment, the patient has consented to  Procedure(s): CORONARY ARTERY BYPASS GRAFTING (CABG) (N/A) as a surgical intervention.  The patient's history has been reviewed, patient examined, no change in status, stable for surgery.  I have reviewed the patient's chart and labs.  Questions were answered to the patient's satisfaction.     [name]

## 2021-07-28 NOTE — Anesthesia Postprocedure Evaluation (Signed)
Anesthesia Post Note  Patient: Marissa Lee  Procedure(s) Performed: CORONARY ARTERY BYPASS GRAFTING (CABG) times two using left internal mammary artery and  left leg saphenous vein (Chest)     Patient location during evaluation: SICU Anesthesia Type: General Level of consciousness: patient remains intubated per anesthesia plan Pain management: pain level controlled Vital Signs Assessment: post-procedure vital signs reviewed and stable Respiratory status: patient remains intubated per anesthesia plan Postop Assessment: no apparent nausea or vomiting Anesthetic complications: no   No notable events documented.  Last Vitals:  Vitals:   07/28/21 1634 07/28/21 1751  BP:  (!) 152/50  Pulse:    Resp:    Temp:    SpO2: 100%     Last Pain:  Vitals:   07/28/21 1801  TempSrc:   PainSc: 4                  Ector Laurel

## 2021-07-28 NOTE — H&P (View-Only) (Signed)
Progress Note  Patient Name: Marissa Lee Date of Encounter: 07/28/2021  Primary Cardiologist: None   Subjective   Patient was seen examined at her bedside in the emergency department.  She tells me that the pain has improved significantly since coming to the hospital.  She is looking forward to her left heart catheterization this morning.  She offers no other complaints at this time.  Inpatient Medications    Scheduled Meds:  aspirin  324 mg Oral NOW   Or   aspirin  300 mg Rectal NOW   aspirin EC  81 mg Oral Daily   atorvastatin  80 mg Oral Daily   ezetimibe  10 mg Oral Daily   FLUoxetine  40 mg Oral Daily   sodium chloride flush  3 mL Intravenous Q12H   Continuous Infusions:  sodium chloride 10 mL/hr at 07/27/21 1943   heparin 750 Units/hr (07/27/21 2008)   PRN Meds: acetaminophen, albuterol, nitroGLYCERIN, ondansetron (ZOFRAN) IV, zolpidem   Vital Signs    Vitals:   07/28/21 0400 07/28/21 0500 07/28/21 0600 07/28/21 0645  BP: (!) 139/59 128/63 124/66 127/72  Pulse: (!) 56 (!) 56 (!) 57 70  Resp: 10 19 17 18   Temp:      SpO2: 96% 95% 97% 97%  Weight:      Height:       No intake or output data in the 24 hours ending 07/28/21 0826 Filed Weights   07/27/21 1717  Weight: 63.5 kg    Telemetry    Sinus rhythm, heart rate between 65 and 67 bpm- Personally Reviewed  ECG    Sinus bradycardia, heart rate 59 bpm with T wave inversions in the high lateral leads- Personally Reviewed  Physical Exam   GEN: No acute distress.   Neck: No JVD Cardiac: RRR, no murmurs, rubs, or gallops.  Respiratory: Clear to auscultation bilaterally. GI: Soft, nontender, non-distended  MS: No edema; No deformity. Neuro:  Nonfocal  Psych: Normal affect   Labs    Chemistry Recent Labs  Lab 07/27/21 1720 07/28/21 0238  NA 139 137  K 3.6 3.5  CL 105 105  CO2 27 24  GLUCOSE 107* 97  BUN 9 9  CREATININE 0.96 0.75  CALCIUM 9.0 8.6*  GFRNONAA >60 >60  ANIONGAP 7 8      Hematology Recent Labs  Lab 07/27/21 1720 07/28/21 0238  WBC 5.9 6.0  RBC 4.54 4.12  HGB 14.6 13.0  HCT 43.6 40.1  MCV 96.0 97.3  MCH 32.2 31.6  MCHC 33.5 32.4  RDW 12.8 12.9  PLT 320 289    Cardiac EnzymesNo results for input(s): TROPONINI in the last 168 hours. No results for input(s): TROPIPOC in the last 168 hours.   BNPNo results for input(s): BNP, PROBNP in the last 168 hours.   DDimer No results for input(s): DDIMER in the last 168 hours.   Radiology    DG Chest 2 View  Result Date: 07/27/2021 CLINICAL DATA:  Chest pain. EXAM: CHEST - 2 VIEW COMPARISON:  Chest radiograph dated 10/10/2016. FINDINGS: The heart size and mediastinal contours are within normal limits. Both lungs are clear. The visualized skeletal structures are unremarkable. IMPRESSION: No active cardiopulmonary disease. Electronically Signed   By: Anner Crete M.D.   On: 07/27/2021 19:09    Cardiac Studies   Jan 29, 2016 echocardiogram Study Conclusions   - Left ventricle: The cavity size was normal. There was mild    concentric hypertrophy. Systolic function was normal. The  estimated ejection fraction was in the range of 55% to 60%. Wall    motion was normal; there were no regional wall motion    abnormalities. Features are consistent with a pseudonormal left    ventricular filling pattern, with concomitant abnormal relaxation    and increased filling pressure (grade 2 diastolic dysfunction).    Doppler parameters are consistent with high ventricular filling    pressure.  - Aortic valve: Trileaflet; mildly thickened, mildly calcified    leaflets.  - Mitral valve: There was mild regurgitation.   Nuclear stress test Jan 29, 2016 There was no ST segment deviation noted during stress. No T wave inversion was noted during stress. The study is normal. This is a low risk study. The left ventricular ejection fraction is normal (55-65%).   Normal exercise treadmill stress test with no evidence  of prior infarct or ischemia. Excellent exercise capacity. Normal LVEF.   Patient Profile     70 y.o. female with hyperlipidemia presenting with 1 day of chest pain admitted for unstable angina.  Assessment & Plan    Unstable angina -plans for left heart catheterization today.  Continue patient on heparin drip.  Continue aspirin as well as Lipitor 80 mg daily.  She is aware of her left heart catheterization that has been planned.  All of her questions has been answered doing our encounter.  The patient understands that risks include but are not limited to stroke (1 in 1000), death (1 in 40), kidney failure [usually temporary] (1 in 500), bleeding (1 in 200), allergic reaction [possibly serious] (1 in 200), and agrees to proceed.  Hyperlipidemia-no hyperlipidemia most recent lipid profile was done this morning shows closer to 36, triglyceride 144, HDL 49, LDL 158.  She is currently on Lipitor 80 mg daily along with Zetia 10 mg daily.  Her LDL goal if CAD will be less than 70 if not less than 100.  Despite this the patient is still not at goal.  She may need to follow-up in the outpatient setting to discuss about opportunities for lipid-lowering agent such as PCSK9 inhibitors.  She may need LP(a) as well in the outpatient setting.  Will resume her medications for her anxiety.   For questions or updates, please contact [place] Please consult [email or web address] for contact info under Cardiology/STEMI.      Signed, Berniece Salines, DO  07/28/2021, 8:26 AM

## 2021-07-28 NOTE — ED Notes (Signed)
ED TO INPATIENT HANDOFF REPORT  ED Nurse Name and Phone #: Darnelle Maffucci [phone]  S Name/Age/Gender Marissa Lee 70 y.o. female Room/Bed: 022C/022C  Code Status   Code Status: Full Code  Home/SNF/Other Home Patient oriented to: self, place, time, and situation Is this baseline? Yes   Triage Complete: Triage complete  Chief Complaint Unstable angina ([place]) [I20.0]  Triage Note Pt here POV with CP. Pt states she has a rock on chest. Pain in back along shoulder blades and jaw. Began yesterday.    Allergies Allergies  Allergen Reactions   Cephalosporins Anaphylaxis and Swelling    Throat swells   Clindamycin/Lincomycin     Metallic taste, blisters behind ears   Codeine Itching    Level of Care/Admitting Diagnosis ED Disposition     ED Disposition  Admit   Condition  --   [place]: [place] [[id number]]  Level of Care: Progressive [102]  Admit to Progressive based on following criteria: CARDIOVASCULAR & THORACIC of moderate stability with acute coronary syndrome symptoms/low risk myocardial infarction/hypertensive urgency/arrhythmias/heart failure potentially compromising stability and stable post cardiovascular intervention patients.  May admit patient to [facility] or [facility] if equivalent level of care is available:: No  Covid Evaluation: Asymptomatic Screening Protocol (No Symptoms)  Diagnosis: Unstable angina [facility]) [[id number]]  Admitting Physician: Josue Hector [[id number]]  Attending Physician: Josue Hector [[id number]]  Estimated length of stay: past midnight tomorrow  Certification:: I certify this patient will need inpatient services for at least 2 midnights          B Medical/Surgery History Past Medical History:  Diagnosis Date   Anxiety    Endometriosis    Fibrocystic breast    muliple drainage   Fibroid    Hiatal hernia    History of bronchitis    Hypercholesteremia 2007   currently not taking anything for it   Osteopenia  08/2016   hip and spine   Subclinical hypothyroidism 10/04/2018   Urge incontinence 06/05/2013   Past Surgical History:  Procedure Laterality Date   ABDOMINAL HYSTERECTOMY     BLADDER SUSPENSION  2004   done with [place]   done 3x   CLOSED REDUCTION METACARPAL WITH PERCUTANEOUS PINNING Right 08/24/2016   Procedure: CLOSED REDUCTION  WITH PERCUTANEOUS PINNING;  Surgeon: Iran Planas, MD;  Location: [place];  Service: Orthopedics;  Laterality: Right;   COLONOSCOPY     COMBINED HYSTERECTOMY VAGINAL / OOPHORECTOMY / A&P REPAIR  2004   FOOT SURGERY Right 1992   OPEN REDUCTION INTERNAL FIXATION (ORIF) HAND Right 08/24/2016   Procedure: RIGHT HAND LONG RING AND SMALL FINGER  POSSIBLE ORIF;  Surgeon: Iran Planas, MD;  Location: [place];  Service: Orthopedics;  Laterality: Right;   [place]     A IV Location/Drains/Wounds Patient Lines/Drains/Airways Status     Active Line/Drains/Airways     Name Placement date Placement time Site Days   Peripheral IV 05/23/18 Left Antecubital 05/23/18  1548  Antecubital  1162   Peripheral IV 07/27/21 20 G Right Antecubital 07/27/21  1814  Antecubital  1   Incision (Closed) 08/24/16 Hand Right 08/24/16  1832  -- 1799            Intake/Output Last 24 hours No intake or output data in the 24 hours ending 07/28/21 1251  Labs/Imaging Results for orders placed or performed during the hospital encounter of 07/27/21 (from the past 48 hour(s))  Basic metabolic panel     Status: Abnormal   Collection Time: 07/27/21  5:20 PM  Result Value Ref Range   Sodium 139 135 - 145 mmol/L   Potassium 3.6 3.5 - 5.1 mmol/L   Chloride 105 98 - 111 mmol/L   CO2 27 22 - 32 mmol/L   Glucose, Bld 107 (H) 70 - 99 mg/dL    Comment: Glucose reference range applies only to samples taken after fasting for at least 8 hours.   BUN 9 8 - 23 mg/dL   Creatinine, Ser 0.96 0.44 - 1.00 mg/dL   Calcium 9.0 8.9 - 10.3 mg/dL   GFR, Estimated >60  >60 mL/min    Comment: (NOTE) Calculated using the CKD-EPI Creatinine Equation (2021)    Anion gap 7 5 - 15    Comment: Performed at [facility] [address] [place] [address]  CBC     Status: None   Collection Time: 07/27/21  5:20 PM  Result Value Ref Range   WBC 5.9 4.0 - 10.5 K/uL   RBC 4.54 3.87 - 5.11 MIL/uL   Hemoglobin 14.6 12.0 - 15.0 g/dL   HCT 43.6 36.0 - 46.0 %   MCV 96.0 80.0 - 100.0 fL   MCH 32.2 26.0 - 34.0 pg   MCHC 33.5 30.0 - 36.0 g/dL   RDW 12.8 11.5 - 15.5 %   Platelets 320 150 - 400 K/uL   nRBC 0.0 0.0 - 0.2 %    Comment: Performed at [facility], [place] [address] [place], [place] [address]  Troponin I (High Sensitivity)     Status: None   Collection Time: 07/27/21  5:20 PM  Result Value Ref Range   Troponin I (High Sensitivity) 14 <18 ng/L    Comment: (NOTE) Elevated high sensitivity troponin I (hsTnI) values and significant  changes across serial measurements may suggest ACS but many other  chronic and acute conditions are known to elevate hsTnI results.  Refer to the "Links" section for chest pain algorithms and additional  guidance. Performed at [facility], [place] [address] [place], [place] [address]   Resp Panel by RT-PCR (Flu A&B, Covid) Nasopharyngeal Swab     Status: Abnormal   Collection Time: 07/27/21  5:57 PM   Specimen: Nasopharyngeal Swab; Nasopharyngeal(NP) swabs in vial transport medium  Result Value Ref Range   SARS Coronavirus 2 by RT PCR POSITIVE (A) NEGATIVE    Comment: RESULT CALLED TO, READ BACK BY AND VERIFIED WITH: TORI DOOSON RN  07/27/2021 @2032  BY JW  (NOTE) SARS-CoV-2 target nucleic acids are DETECTED.  The SARS-CoV-2 RNA is generally detectable in upper respiratory specimens during the acute phase of infection. Positive results are indicative of the presence of the identified virus, but do not rule out bacterial infection or co-infection with other pathogens not detected by the test.  Clinical correlation with patient history and other diagnostic information is necessary to determine patient infection status. The expected result is Negative.  Fact Sheet for Patients: EntrepreneurPulse.com.au  Fact Sheet for Healthcare Providers: IncredibleEmployment.be  This test is not yet approved or cleared by the [place] FDA and  has been authorized for detection and/or diagnosis of SARS-CoV-2 by FDA under an Emergency Use Authorization (EUA).  This EUA will remain in effect (meaning this test  can be used) for the duration of  the COVID-19 declaration under Section 564(b)(1) of the Act, 21 U.S.C. section 360bbb-3(b)(1), unless the authorization is terminated or  revoked sooner.     Influenza A by PCR NEGATIVE NEGATIVE   Influenza B by PCR NEGATIVE NEGATIVE    Comment: (NOTE) The Xpert Xpress SARS-CoV-2/FLU/RSV plus assay is intended as an aid in the diagnosis of influenza from Nasopharyngeal swab specimens and should not be used as a sole basis for treatment. Nasal washings and aspirates are unacceptable for Xpert Xpress SARS-CoV-2/FLU/RSV testing.  Fact Sheet for Patients: EntrepreneurPulse.com.au  Fact Sheet for Healthcare Providers: IncredibleEmployment.be  This test is not yet approved or cleared by the [place] FDA and has been authorized for detection and/or diagnosis of SARS-CoV-2 by FDA under an Emergency Use Authorization (EUA). This EUA will remain in effect (meaning this test can be used) for the duration of the COVID-19 declaration under Section 564(b)(1) of the Act, 21 U.S.C. section 360bbb-3(b)(1), unless the authorization is terminated or revoked.  Performed at [facility], [place] [address] [place], [place] [address]   Troponin I (High Sensitivity)     Status: None   Collection Time: 07/27/21  7:48 PM  Result Value Ref Range   Troponin I (High Sensitivity) 14  <18 ng/L    Comment: (NOTE) Elevated high sensitivity troponin I (hsTnI) values and significant  changes across serial measurements may suggest ACS but many other  chronic and acute conditions are known to elevate hsTnI results.  Refer to the "Links" section for chest pain algorithms and additional  guidance. Performed at [facility], [place] [address] [place], [place] [address]   HIV Antibody (routine testing w rflx)     Status: None   Collection Time: 07/27/21  7:48 PM  Result Value Ref Range   HIV Screen 4th Generation wRfx Non Reactive Non Reactive    Comment: Performed at [facility], [place] [address] [place], [place] [address]  Magnesium     Status: None   Collection Time: 07/27/21  7:48 PM  Result Value Ref Range   Magnesium 2.0 1.7 - 2.4 mg/dL    Comment: Performed at [facility], [place] [address] [place], [place] [address]  TSH     Status: None   Collection Time: 07/27/21  7:48 PM  Result Value Ref Range   TSH 3.643 0.350 - 4.500 uIU/mL    Comment: Performed by a 3rd Generation assay with a functional sensitivity of <=0.01 uIU/mL. Performed at [facility], [place] [address], [place], [place] [address]   T4, free     Status: None   Collection Time: 07/27/21  7:48 PM  Result Value Ref Range   Free T4 0.93 0.61 - 1.12 ng/dL    Comment: (NOTE) Biotin ingestion may interfere with free T4 tests. If the results are inconsistent with the TSH level, previous test results, or the clinical presentation, then consider biotin interference. If needed, order repeat testing after stopping biotin. Performed at [facility], [place] [address], [place],  [address]   Basic metabolic panel     Status: Abnormal   Collection Time: 07/28/21  2:38 AM  Result Value Ref Range   Sodium 137 135 - 145 mmol/L   Potassium 3.5 3.5 - 5.1 mmol/L   Chloride 105 98 - 111 mmol/L   CO2 24 22 - 32 mmol/L   Glucose, Bld 97 70 - 99 mg/dL    Comment: Glucose reference  range applies only to samples taken after fasting for at least 8 hours.   BUN 9 8 - 23 mg/dL  Creatinine, Ser 0.75 0.44 - 1.00 mg/dL   Calcium 8.6 (L) 8.9 - 10.3 mg/dL   GFR, Estimated >60 >60 mL/min    Comment: (NOTE) Calculated using the CKD-EPI Creatinine Equation (2021)    Anion gap 8 5 - 15    Comment: Performed at [facility] [address], [place], [place] [address]  Lipid panel     Status: Abnormal   Collection Time: 07/28/21  2:38 AM  Result Value Ref Range   Cholesterol 236 (H) 0 - 200 mg/dL   Triglycerides 144 <150 mg/dL   HDL 49 >40 mg/dL   Total CHOL/HDL Ratio 4.8 RATIO   VLDL 29 0 - 40 mg/dL   LDL Cholesterol 158 (H) 0 - 99 mg/dL    Comment:        Total Cholesterol/HDL:CHD Risk Coronary Heart Disease Risk Table                     Men   Women  1/2 Average Risk   3.4   3.3  Average Risk       5.0   4.4  2 X Average Risk   9.6   7.1  3 X Average Risk  23.4   11.0        Use the calculated Patient Ratio above and the CHD Risk Table to determine the patient's CHD Risk.        ATP III CLASSIFICATION (LDL):  <100     mg/dL   Optimal  100-129  mg/dL   Near or Above                    Optimal  130-159  mg/dL   Borderline  160-189  mg/dL   High  >190     mg/dL   Very High Performed at [facility] [address] [place] [address]   CBC     Status: None   Collection Time: 07/28/21  2:38 AM  Result Value Ref Range   WBC 6.0 4.0 - 10.5 K/uL   RBC 4.12 3.87 - 5.11 MIL/uL   Hemoglobin 13.0 12.0 - 15.0 g/dL   HCT 40.1 36.0 - 46.0 %   MCV 97.3 80.0 - 100.0 fL   MCH 31.6 26.0 - 34.0 pg   MCHC 32.4 30.0 - 36.0 g/dL   RDW 12.9 11.5 - 15.5 %   Platelets 289 150 - 400 K/uL   nRBC 0.0 0.0 - 0.2 %    Comment: Performed at [facility], [place] [address], [place],  [address]  Protime-INR     Status: None   Collection Time: 07/28/21  2:38 AM  Result Value Ref Range   Prothrombin Time 13.5 11.4 - 15.2 seconds   INR 1.0 0.8 - 1.2     Comment: (NOTE) INR goal varies based on device and disease states. Performed at [facility], [place] [address], [place], [place] [address]   Heparin level (unfractionated)     Status: None   Collection Time: 07/28/21  2:38 AM  Result Value Ref Range   Heparin Unfractionated 0.36 0.30 - 0.70 IU/mL    Comment: (NOTE) The clinical reportable range upper limit is being lowered to >1.10 to align with the FDA approved guidance for the current laboratory assay.  If heparin results are below expected values, and patient dosage has  been confirmed, suggest follow up testing of antithrombin III levels. Performed at [facility], [place] [address], [place], [place] [address]   Hemoglobin A1c     Status: None   Collection Time: 07/28/21 10:34 AM  Result Value Ref Range   Hgb A1c MFr Bld 5.2 4.8 - 5.6 %    Comment: (NOTE) Pre diabetes:          5.7%-6.4%  Diabetes:              >6.4%  Glycemic control for   <7.0% adults with diabetes    Mean Plasma Glucose 102.54 mg/dL    Comment: Performed at [facility] [address], [place], [place] [address]  Heparin level (unfractionated)     Status: None   Collection Time: 07/28/21 10:34 AM  Result Value Ref Range   Heparin Unfractionated 0.32 0.30 - 0.70 IU/mL    Comment: (NOTE) The clinical reportable range upper limit is being lowered to >1.10 to align with the FDA approved guidance for the current laboratory assay.  If heparin results are below expected values, and patient dosage has  been confirmed, suggest follow up testing of antithrombin III levels. Performed at [facility], [place] [address] [place], [place] [address]    DG Chest 2 View  Result Date: 07/27/2021 CLINICAL DATA:  Chest pain. EXAM: CHEST - 2 VIEW COMPARISON:  Chest radiograph dated 10/10/2016. FINDINGS: The heart size and mediastinal contours are within normal limits. Both lungs are clear. The visualized skeletal structures are unremarkable.  IMPRESSION: No active cardiopulmonary disease. Electronically Signed   By: Anner Crete M.D.   On: 07/27/2021 19:09    Pending Labs Unresulted Labs (From admission, onward)     Start     Ordered   07/29/21 0500  Heparin level (unfractionated)  Daily,   R      07/27/21 1951   07/29/21 0500  CBC  Daily,   R      07/28/21 1151            Vitals/Pain Today's Vitals   07/28/21 0944 07/28/21 1000 07/28/21 1233 07/28/21 1238  BP:  130/74 131/70 131/70  Pulse:  (!) 59 (!) 57 (!) 58  Resp:  20 19 18   Temp:    98.1 F (36.7 C)  TempSrc:    Oral  SpO2:  98% 95% 96%  Weight:      Height:      PainSc: 0-No pain   2     Isolation Precautions No active isolations  Medications Medications  ezetimibe (ZETIA) tablet 10 mg (10 mg Oral Given 07/28/21 0943)  FLUoxetine (PROZAC) capsule 40 mg (40 mg Oral Given 07/28/21 0942)  albuterol (VENTOLIN HFA) 108 (90 Base) MCG/ACT inhaler 2 puff (has no administration in time range)  aspirin chewable tablet 324 mg (324 mg Oral Not Given 07/27/21 1933)    Or  aspirin suppository 300 mg ( Rectal See Alternative 07/27/21 1933)  aspirin EC tablet 81 mg (81 mg Oral Given 07/28/21 0940)  nitroGLYCERIN (NITROSTAT) SL tablet 0.4 mg (has no administration in time range)  acetaminophen (TYLENOL) tablet 650 mg (has no administration in time range)  ondansetron (ZOFRAN) injection 4 mg (has no administration in time range)  0.9 %  sodium chloride infusion ( Intravenous New Bag/Given 07/27/21 1943)  atorvastatin (LIPITOR) tablet 80 mg (80 mg Oral Given 07/28/21 0942)  zolpidem (AMBIEN) tablet 5 mg (has no administration in time range)  sodium chloride flush (NS) 0.9 % injection 3 mL (3 mLs Intravenous Given 07/28/21 0944)  sodium chloride flush (NS) 0.9 % injection 3 mL (has no administration in time range)  0.9 %  sodium chloride infusion (has no administration in time range)  heparin ADULT infusion 100 units/mL (25000 units/2105mL) (800 Units/hr Intravenous  Rate/Dose Change 07/28/21 1239)  0.9% sodium chloride infusion (has no administration in time range)    Followed by  0.9% sodium chloride infusion (has no administration in time range)  aspirin chewable tablet 81 mg (has no administration in time range)  aspirin chewable tablet 324 mg (324 mg Oral Given 07/27/21 1835)  heparin bolus via infusion 3,800 Units (3,800 Units Intravenous Bolus from Bag 07/27/21 2008)    Mobility walks Low fall risk      R Recommendations: See Admitting Provider Note  Report given to:   Additional Notes:

## 2021-07-28 NOTE — Progress Notes (Signed)
Progress Note  Patient Name: Marissa Lee Date of Encounter: 07/28/2021  Primary Cardiologist: None   Subjective   Patient was seen examined at her bedside in the emergency department.  She tells me that the pain has improved significantly since coming to the hospital.  She is looking forward to her left heart catheterization this morning.  She offers no other complaints at this time.  Inpatient Medications    Scheduled Meds:  aspirin  324 mg Oral NOW   Or   aspirin  300 mg Rectal NOW   aspirin EC  81 mg Oral Daily   atorvastatin  80 mg Oral Daily   ezetimibe  10 mg Oral Daily   FLUoxetine  40 mg Oral Daily   sodium chloride flush  3 mL Intravenous Q12H   Continuous Infusions:  sodium chloride 10 mL/hr at 07/27/21 1943   heparin 750 Units/hr (07/27/21 2008)   PRN Meds: acetaminophen, albuterol, nitroGLYCERIN, ondansetron (ZOFRAN) IV, zolpidem   Vital Signs    Vitals:   07/28/21 0400 07/28/21 0500 07/28/21 0600 07/28/21 0645  BP: (!) 139/59 128/63 124/66 127/72  Pulse: (!) 56 (!) 56 (!) 57 70  Resp: 10 19 17 18   Temp:      SpO2: 96% 95% 97% 97%  Weight:      Height:       No intake or output data in the 24 hours ending 07/28/21 0826 Filed Weights   07/27/21 1717  Weight: 63.5 kg    Telemetry    Sinus rhythm, heart rate between 65 and 67 bpm- Personally Reviewed  ECG    Sinus bradycardia, heart rate 59 bpm with T wave inversions in the high lateral leads- Personally Reviewed  Physical Exam   GEN: No acute distress.   Neck: No JVD Cardiac: RRR, no murmurs, rubs, or gallops.  Respiratory: Clear to auscultation bilaterally. GI: Soft, nontender, non-distended  MS: No edema; No deformity. Neuro:  Nonfocal  Psych: Normal affect   Labs    Chemistry Recent Labs  Lab 07/27/21 1720 07/28/21 0238  NA 139 137  K 3.6 3.5  CL 105 105  CO2 27 24  GLUCOSE 107* 97  BUN 9 9  CREATININE 0.96 0.75  CALCIUM 9.0 8.6*  GFRNONAA >60 >60  ANIONGAP 7 8      Hematology Recent Labs  Lab 07/27/21 1720 07/28/21 0238  WBC 5.9 6.0  RBC 4.54 4.12  HGB 14.6 13.0  HCT 43.6 40.1  MCV 96.0 97.3  MCH 32.2 31.6  MCHC 33.5 32.4  RDW 12.8 12.9  PLT 320 289    Cardiac EnzymesNo results for input(s): TROPONINI in the last 168 hours. No results for input(s): TROPIPOC in the last 168 hours.   BNPNo results for input(s): BNP, PROBNP in the last 168 hours.   DDimer No results for input(s): DDIMER in the last 168 hours.   Radiology    DG Chest 2 View  Result Date: 07/27/2021 CLINICAL DATA:  Chest pain. EXAM: CHEST - 2 VIEW COMPARISON:  Chest radiograph dated 10/10/2016. FINDINGS: The heart size and mediastinal contours are within normal limits. Both lungs are clear. The visualized skeletal structures are unremarkable. IMPRESSION: No active cardiopulmonary disease. Electronically Signed   By: Anner Crete M.D.   On: 07/27/2021 19:09    Cardiac Studies   Jan 29, 2016 echocardiogram Study Conclusions   - Left ventricle: The cavity size was normal. There was mild    concentric hypertrophy. Systolic function was normal. The  estimated ejection fraction was in the range of 55% to 60%. Wall    motion was normal; there were no regional wall motion    abnormalities. Features are consistent with a pseudonormal left    ventricular filling pattern, with concomitant abnormal relaxation    and increased filling pressure (grade 2 diastolic dysfunction).    Doppler parameters are consistent with high ventricular filling    pressure.  - Aortic valve: Trileaflet; mildly thickened, mildly calcified    leaflets.  - Mitral valve: There was mild regurgitation.   Nuclear stress test Jan 29, 2016 There was no ST segment deviation noted during stress. No T wave inversion was noted during stress. The study is normal. This is a low risk study. The left ventricular ejection fraction is normal (55-65%).   Normal exercise treadmill stress test with no evidence  of prior infarct or ischemia. Excellent exercise capacity. Normal LVEF.   Patient Profile     70 y.o. female with hyperlipidemia presenting with 1 day of chest pain admitted for unstable angina.  Assessment & Plan    Unstable angina -plans for left heart catheterization today.  Continue patient on heparin drip.  Continue aspirin as well as Lipitor 80 mg daily.  She is aware of her left heart catheterization that has been planned.  All of her questions has been answered doing our encounter.  The patient understands that risks include but are not limited to stroke (1 in 1000), death (1 in 9), kidney failure [usually temporary] (1 in 500), bleeding (1 in 200), allergic reaction [possibly serious] (1 in 200), and agrees to proceed.  Hyperlipidemia-no hyperlipidemia most recent lipid profile was done this morning shows closer to 36, triglyceride 144, HDL 49, LDL 158.  She is currently on Lipitor 80 mg daily along with Zetia 10 mg daily.  Her LDL goal if CAD will be less than 70 if not less than 100.  Despite this the patient is still not at goal.  She may need to follow-up in the outpatient setting to discuss about opportunities for lipid-lowering agent such as PCSK9 inhibitors.  She may need LP(a) as well in the outpatient setting.  Will resume her medications for her anxiety.   For questions or updates, please contact [employer] Please consult [email or web address] for contact info under Cardiology/STEMI.      Signed, Berniece Salines, DO  07/28/2021, 8:26 AM

## 2021-07-28 NOTE — Hospital Course (Addendum)
History of Present Illness:  Marissa Lee is an 70 y.o. female with no prior cardiac history presented to ED with recurrent CP over past 48 hours. ECG changes noted in ED. Troponins negative.  She was admitted for further work up.  Cardiac catheterization performed on 07/28/2021 showed critical left main stenosis with remainder of coronaries normal.  She experienced chest pain during the procedure and IABP was inserted.  It was felt emergent coronary bypass grafting would be indicated and Cardiac surgery consultation was obtained.  She was evaluated by Dr. Roxan Hockey at which time her chest pain had improved.  He was in agreement the patient would benefit from coronary bypass grafting. The risks and benefits of the procedure were explained to the patient and she was agreeable to proceed.  Hospital Course:  She was taken to the operating room and underwent Emergent CABG x 2 utilizing LIMA to LAD and SVG to OM  She underwent endoscopic harvest of greater saphenous vein from her left thigh.  She tolerated the procedure without difficulty and was taken to the SICU in stable condition.  The patient was weaned off Neo as hemodynamics allowed.  Her IABP was removed on POD #1.  She had hypercarbic acute respiratory failure and she was weaned off ventilator and extubated on 07/29/2021.  Her chest tubes and arterial lines were removed without difficulty.  The patient had nausea with gastric dilatation on CXR.  She was treated with IV reglan.  She developed itching post tramadol use.  This was discontinued and she was treated with Toradol in place.  She underwent debridement of her central line.  She was maintaining NSR and felt stable for transfer to the progressive care unit on 07/31/2021.  Patient wires were removed routinely on 08/01/2021 and Plavix was started on the following day for acute coronary syndrome. She was felt surgically stable for transfer from the ICU;however, a bed was not available until 11/07. She has  been ambulating with good oxygenation on room air. PA/LAT CXR showed moderate left pleural effusion. Diuretics were continued and as discussed with Dr. Roxan Hockey, no need for left thoracentesis at this time. She will be seen in the office in one week with a PA/LAT CXR to monitor effusion.  She is having some difficulty expectorating sputum so she was given Mucinex. She is tolerating a diet and has had a bowel movement. Her wounds are clean, dry, and healing without signs of infection. She asked for an explanation of hospital course and this was discussed at length with her. She was also instructed she can view her medical record via my chart or request medical records. As discussed with Dr. Roxan Hockey, she is felt surgically stable for discharge today.

## 2021-07-28 NOTE — Progress Notes (Signed)
ANTICOAGULATION CONSULT NOTE  Pharmacy Consult for IV heparin Indication: chest pain/ACS  Allergies  Allergen Reactions   Cephalosporins Anaphylaxis and Swelling    Throat swells   Clindamycin/Lincomycin     Metallic taste, blisters behind ears   Codeine Itching    Patient Measurements: Height: 5\' 4"  (162.6 cm) Weight: 63.5 kg (140 lb) IBW/kg (Calculated) : 54.7 Heparin Dosing Weight: 63.5 kg  Vital Signs: Temp: 98.5 F (36.9 C) (11/02 0931) Temp Source: Oral (11/02 0931) BP: 137/66 (11/02 0931) Pulse Rate: 62 (11/02 0931)  Labs: Recent Labs    07/27/21 1720 07/27/21 1948 07/28/21 0238 07/28/21 1034  HGB 14.6  --  13.0  --   HCT 43.6  --  40.1  --   PLT 320  --  289  --   LABPROT  --   --  13.5  --   INR  --   --  1.0  --   HEPARINUNFRC  --   --  0.36 0.32  CREATININE 0.96  --  0.75  --   TROPONINIHS 14 14  --   --      Estimated Creatinine Clearance: 58.1 mL/min (by C-G formula based on SCr of 0.75 mg/dL).    Assessment: 70 yo F presents with chest pain x 1 day with no previous cardiac history. No PTA anticoagulation. Pharmacy consulted to start heparin IV.  Heparin level remains therapeutic on low end at 750 units/hr, plans for cath per cards  Goal of Therapy:  Heparin level 0.3-0.7 units/ml Monitor platelets by anticoagulation protocol: Yes   Plan:  Increase heparin gtt slightly to 800 units/hr Daily heparin level, CBC, s/s bleeding F/u cath   Bertis Ruddy, PharmD Clinical Pharmacist ED Pharmacist Phone # [phone] 07/28/2021 11:50 AM

## 2021-07-28 NOTE — Anesthesia Procedure Notes (Signed)
Procedure Name: Intubation Date/Time: 07/28/2021 7:02 PM Performed by: Eligha Bridegroom, CRNA Pre-anesthesia Checklist: Patient identified, Emergency Drugs available, Suction available, Patient being monitored and Timeout performed Patient Re-evaluated:Patient Re-evaluated prior to induction Oxygen Delivery Method: Circle system utilized Preoxygenation: Pre-oxygenation with 100% oxygen Induction Type: Rapid sequence and Cricoid Pressure applied Laryngoscope Size: Mac and 4 Grade View: Grade II Tube type: Oral Tube size: 7.5 mm Number of attempts: 1 Airway Equipment and Method: Stylet Placement Confirmation: ETT inserted through vocal cords under direct vision, positive ETCO2 and breath sounds checked- equal and bilateral Secured at: 21 cm Tube secured with: Tape Dental Injury: Teeth and Oropharynx as per pre-operative assessment

## 2021-07-28 NOTE — Progress Notes (Signed)
Patient's Covid test from 07/27/21 was +.  Patient also has a covid + result in care everywhere from 06/30/21.  Patient denies any cough, fever, sore throat or congestion.  Spoke to [employer] in infection prevention.  NO precautions needed.

## 2021-07-28 NOTE — Interval H&P Note (Signed)
History and Physical Interval Note:  07/28/2021 4:35 PM  Marissa Lee  has presented today for surgery, with the diagnosis of unstable angina.  The various methods of treatment have been discussed with the patient and family. After consideration of risks, benefits and other options for treatment, the patient has consented to  Procedure(s): LEFT HEART CATH AND CORONARY ANGIOGRAPHY (N/A)  PERCUTANEOUS CORONARY INTERVENTION   as a surgical intervention.  The patient's history has been reviewed, patient examined, no change in status, stable for surgery.  I have reviewed the patient's chart and labs.  Questions were answered to the patient's satisfaction.    Cath Lab Visit (complete for each Cath Lab visit)  Clinical Evaluation Leading to the Procedure:   ACS: Yes  Non-ACS:    Anginal Classification: CCS IV  Anti-ischemic medical therapy: Minimal Therapy (1 class of medications)  Non-Invasive Test Results: No non-invasive testing performed  Prior CABG: No previous CABG She had never had his  For Marissa Lee

## 2021-07-28 NOTE — Progress Notes (Signed)
  Echocardiogram 2D Echocardiogram has been performed.  Marissa Lee 07/28/2021, 11:21 AM

## 2021-07-28 NOTE — Brief Op Note (Signed)
07/27/2021 - 07/28/2021  9:02 AM  PATIENT:  Marissa Lee  70 y.o. female  PRE-OPERATIVE DIAGNOSIS:  CAD  POST-OPERATIVE DIAGNOSIS:  CAD  PROCEDURE:  Procedure(s):  CORONARY ARTERY BYPASS GRAFTING x 2 -LIMA to LAD -SVG to OM  ENDOSCOPIC HARVEST GREATER SAPHENOUS VEIN -Left Thigh (15/10)  SURGEON:  Surgeon(s) and Role:    * [name], MD - Primary  PHYSICIAN ASSISTANT: Ellwood Handler PA-C  ASSISTANTS: Dineen Kid RNFA   ANESTHESIA:   general  EBL:  700 mL   BLOOD ADMINISTERED:   CC CELLSAVER  DRAINS:  Left Pleural Chest Tube, Mediastinal Chest Drain    LOCAL MEDICATIONS USED:  NONE  SPECIMEN:  No Specimen  DISPOSITION OF SPECIMEN:  N/A  COUNTS:  YES  TOURNIQUET:  * No tourniquets in log *  DICTATION: .Dragon Dictation  PLAN OF CARE: Admit to inpatient   PATIENT DISPOSITION:  ICU - intubated and hemodynamically stable.   Delay start of Pharmacological VTE agent (>24hrs) due to surgical blood loss or risk of bleeding: yes

## 2021-07-28 NOTE — Anesthesia Preprocedure Evaluation (Addendum)
Anesthesia Evaluation  Patient identified by MRN, date of birth, ID band  Reviewed: Allergy & Precautions, Patient's Chart, lab work & pertinent test results, Unable to perform ROS - Chart review onlyPreop documentation limited or incomplete due to emergent nature of procedure.  History of Anesthesia Complications Negative for: history of anesthetic complications  Airway Mallampati: II  TM Distance: >3 FB     Dental   Pulmonary former smoker,    breath sounds clear to auscultation       Cardiovascular + angina + CAD   Rhythm:Regular Rate:Normal  Echo 07/28/2021 1. Left ventricular ejection fraction, by estimation, is 60 to 65%. The left ventricle has normal function. The left ventricle has no regional wall motion abnormalities. Left ventricular diastolic parameters are indeterminate.  2. Right ventricular systolic function is normal. The right ventricular size is normal. There is normal pulmonary artery systolic pressure.  3. Left atrial size was mildly dilated.  4. The mitral valve is normal in structure. No evidence of mitral valve regurgitation.  5. The aortic valve is normal in structure. Aortic valve regurgitation is not visualized. No aortic stenosis is present.  6. The inferior vena cava is normal in size with greater than 50% respiratory variability, suggesting right atrial pressure of 3 mmHg.    Neuro/Psych Anxiety negative neurological ROS     GI/Hepatic Neg liver ROS, hiatal hernia,   Endo/Other  Hypothyroidism   Renal/GU negative Renal ROS     Musculoskeletal   Abdominal   Peds  Hematology negative hematology ROS (+)   Anesthesia Other Findings   Reproductive/Obstetrics                            Anesthesia Physical Anesthesia Plan  ASA: 4  Anesthesia Plan: General   Post-op Pain Management:    Induction: Intravenous  PONV Risk Score and Plan: 4 or greater and Midazolam  and Treatment may vary due to age or medical condition  Airway Management Planned: Oral ETT  Additional Equipment: Arterial line, CVP, TEE and Ultrasound Guidance Line Placement  Intra-op Plan:   Post-operative Plan: Post-operative intubation/ventilation  Informed Consent: I have reviewed the patients History and Physical, chart, labs and discussed the procedure including the risks, benefits and alternatives for the proposed anesthesia with the patient or authorized representative who has indicated his/her understanding and acceptance.     Dental advisory given  Plan Discussed with: CRNA, Anesthesiologist and Surgeon  Anesthesia Plan Comments:        Anesthesia Quick Evaluation

## 2021-07-28 NOTE — Transfer of Care (Signed)
Immediate Anesthesia Transfer of Care Note  Patient: Marissa Lee  Procedure(s) Performed: CORONARY ARTERY BYPASS GRAFTING (CABG) times two using left internal mammary artery and  left leg saphenous vein (Chest)  Patient Location: ICU  Anesthesia Type:General  Level of Consciousness: Patient remains intubated per anesthesia plan  Airway & Oxygen Therapy: Patient remains intubated per anesthesia plan and Patient placed on Ventilator (see vital sign flow sheet for setting)  Post-op Assessment: Report given to RN and Post -op Vital signs reviewed and stable  Post vital signs: Reviewed and stable  Last Vitals:  Vitals Value Taken Time  BP 121/73 07/28/21 2300  Temp 35.7 C 07/28/21 2302  Pulse 80 07/28/21 2302  Resp 16 07/28/21 2302  SpO2 100 % 07/28/21 2302  Vitals shown include unvalidated device data.  Last Pain:  Vitals:   07/28/21 1801  TempSrc:   PainSc: 4          Complications: No notable events documented.

## 2021-07-28 NOTE — Progress Notes (Signed)
ANTICOAGULATION CONSULT NOTE  Pharmacy Consult for IV heparin Indication: chest pain/ACS  Allergies  Allergen Reactions   Cephalosporins Anaphylaxis and Swelling    Throat swells   Clindamycin/Lincomycin     Metallic taste, blisters behind ears   Codeine Itching    Patient Measurements: Height: 5\' 4"  (162.6 cm) Weight: 63.5 kg (140 lb) IBW/kg (Calculated) : 54.7 Heparin Dosing Weight: 63.5 kg  Vital Signs: Temp: 98.7 F (37.1 C) (11/01 1716) BP: 124/58 (11/02 0200) Pulse Rate: 57 (11/02 0200)  Labs: Recent Labs    07/27/21 1720 07/27/21 1948 07/28/21 0238  HGB 14.6  --  13.0  HCT 43.6  --  40.1  PLT 320  --  289  LABPROT  --   --  13.5  INR  --   --  1.0  HEPARINUNFRC  --   --  0.36  CREATININE 0.96  --   --   TROPONINIHS 14 14  --      Estimated Creatinine Clearance: 48.4 mL/min (by C-G formula based on SCr of 0.96 mg/dL).    Assessment: 70 yo F presents with chest pain x 1 day with no previous cardiac history. No PTA anticoagulation. Pharmacy consulted to start heparin IV.  Heparin level therapeutic (0.36) on gtt at 750 units/hr. No bleeding noted.  Goal of Therapy:  Heparin level 0.3-0.7 units/ml Monitor platelets by anticoagulation protocol: Yes   Plan:  Continue heparin IV 750 units/hr F/u 6hr confirmatory heparin level  Sherlon Handing, PharmD, BCPS Please see amion for complete clinical pharmacist phone list 07/28/2021,3:18 AM

## 2021-07-28 NOTE — Brief Op Note (Signed)
BRIEF CARDIAC CATH-IABP NOTE    07/28/2021 6:01 PM  PROCEDURE:  Procedure(s): LEFT HEART CATH AND CORONARY ANGIOGRAPHY (N/A) IABP Insertion (Right)  SURGEON:  Surgeon(s) and Role: Leonie Man, MD - Primary ASSISTANT: Images reviewed with Drs. Farrel Demark and Dr. Roxan Hockey from [facility]  PATIENT:  Marissa Lee  70 y.o. female former smoker with no prior cardiac history but family history of valvular heart disease who was admitted [facility] via the ER with symptoms concerning for unstable angina.  She was referred for cardiac catheterization today.  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 Normal/preserved LVEF with normal EDP. Successful placement of 50 cc IABP pump via RFA 8 French sheath   CARDIAC CATHETERIZATION/CORONARY ANGIOGRAPHY: Time Out: Verified patient identification, verified procedure, site/side was marked, verified correct patient position, special equipment/implants available, medications/allergies/relevent history reviewed, required imaging and test results available. Performed.  Access:  RIGHT Radial Artery: 6 Fr sheath -- Seldinger technique using Micropuncture Kit Direct ultrasound guidance used.  Permanent image obtained and placed on chart. 10 mL radial cocktail IA; 4000 Units IV Heparin RIGHT Common Femoral Artery: 6 Fr Sheath - fluoroscopically guided modified Seldinger Technique  Direct ultrasound guidance used.  Permanent image obtained and placed on chart. This sheath was then exchanged for the 8 French IABP sheath.  Left Heart Catheterization: 5 Fr TIG 4.0 catheter  was advanced or exchanged over a J-wire under direct fluoroscopic guidance into the ascending aorta; the aortic valve was crossed first for hemodynamics  and LV gram.  The catheter is in pullback across aortic valve measurement of aortic valve gradient.  The same catheter was then used to engage first the Right than the Left Coronary Arteries for selective cineangiography.  IC NTG was administered for repeat LCA angiography.  Upon completion of Angiogaphy, the catheter was removed completely out of the body over a wire, without complication.  Review of initial angiography revealed: Severe ostial LM 80 to 90% with no improvement despite IC NTG  Images were reviewed with Dr. Tamala Julian, Dr. Harriet Masson  and Dr. Roxan Hockey from [facility]  Preparations are made for IABP Placement and Emergent CABG 50 mL IABP advanced under fluoroscopic guidance via the 8 French sheath.   Femoral  Sheath(s) was sutured in place with IABP catheter cover in place.  StatLock dressing was applied to maintain stability of the pump.  Position was then confirmed via fluoroscopy. -> Biopatch in place and sterile dressing applied   Radial sheath was sutured in place to be used as arterial line for CABG.  The tubing was connected to arterial line. -> Biopatch in place and sterile dressing applied   MEDICATIONS SQ Lidocaine 3 mL for radial, 15 mL for femoral Radial Cocktail: 3 mg Verapmil in 10 mL NS Heparin: 4000 units IC NTG 200 mcg x 1  ANESTHESIA:   local and IV sedation -> 3 mg IV Versed, 75 mg IV fentanyl  EBL:  < 50 mL   BLOOD ADMINISTERED:none  SPECIMEN: Blood sample sent for Type and Cross for CABG  DICTATION: .Note written in EPIC  PLAN OF CARE:  Emergent CABG-from OR to CVICU Post CABG, will need aggressive cardiovascular risk factor modification.  PATIENT DISPOSITION:   Taken directly to the OR after being seen by daughter.   Delay start of Pharmacological VTE agent (>  24hrs) due to surgical blood loss or risk of bleeding: not applicable     Glenetta Hew, MD Glenetta Hew, M.D., M.S. Interventional Cardiologist   Pager # [phone] Phone #  [phone] [address] [place], [place] [address]

## 2021-07-28 NOTE — ED Notes (Signed)
Breakfast Orders placed 

## 2021-07-29 ENCOUNTER — Inpatient Hospital Stay (HOSPITAL_COMMUNITY): Payer: Medicare Other

## 2021-07-29 ENCOUNTER — Encounter (HOSPITAL_COMMUNITY): Payer: Self-pay | Admitting: Cardiology

## 2021-07-29 DIAGNOSIS — Z951 Presence of aortocoronary bypass graft: Secondary | ICD-10-CM

## 2021-07-29 DIAGNOSIS — I2510 Atherosclerotic heart disease of native coronary artery without angina pectoris: Secondary | ICD-10-CM

## 2021-07-29 DIAGNOSIS — E785 Hyperlipidemia, unspecified: Secondary | ICD-10-CM | POA: Diagnosis not present

## 2021-07-29 LAB — CBC
HCT: 27.2 % — ABNORMAL LOW (ref 36.0–46.0)
HCT: 26.2 % — ABNORMAL LOW (ref 36.0–46.0)
HCT: 25.7 % — ABNORMAL LOW (ref 36.0–46.0)
Hemoglobin: 9.2 g/dL — ABNORMAL LOW (ref 12.0–15.0)
Hemoglobin: 8.6 g/dL — ABNORMAL LOW (ref 12.0–15.0)
Hemoglobin: 8.5 g/dL — ABNORMAL LOW (ref 12.0–15.0)
MCH: 32.3 pg (ref 26.0–34.0)
MCH: 32.2 pg (ref 26.0–34.0)
MCH: 32.4 pg (ref 26.0–34.0)
MCHC: 33.8 g/dL (ref 30.0–36.0)
MCHC: 32.8 g/dL (ref 30.0–36.0)
MCHC: 33.1 g/dL (ref 30.0–36.0)
MCV: 95.4 fL (ref 80.0–100.0)
MCV: 98.1 fL (ref 80.0–100.0)
MCV: 98.1 fL (ref 80.0–100.0)
Platelets: 130 10*3/uL — ABNORMAL LOW (ref 150–400)
Platelets: 152 10*3/uL (ref 150–400)
Platelets: 147 10*3/uL — ABNORMAL LOW (ref 150–400)
RBC: 2.85 MIL/uL — ABNORMAL LOW (ref 3.87–5.11)
RBC: 2.67 MIL/uL — ABNORMAL LOW (ref 3.87–5.11)
RBC: 2.62 MIL/uL — ABNORMAL LOW (ref 3.87–5.11)
RDW: 12.7 % (ref 11.5–15.5)
RDW: 12.8 % (ref 11.5–15.5)
RDW: 13.2 % (ref 11.5–15.5)
WBC: 8.6 10*3/uL (ref 4.0–10.5)
WBC: 7.6 10*3/uL (ref 4.0–10.5)
WBC: 10.2 10*3/uL (ref 4.0–10.5)
nRBC: 0 % (ref 0.0–0.2)
nRBC: 0 % (ref 0.0–0.2)
nRBC: 0 % (ref 0.0–0.2)

## 2021-07-29 LAB — POCT I-STAT 7, (LYTES, BLD GAS, ICA,H+H)
Acid-base deficit: 2 mmol/L (ref 0.0–2.0)
Acid-base deficit: 5 mmol/L — ABNORMAL HIGH (ref 0.0–2.0)
Acid-base deficit: 4 mmol/L — ABNORMAL HIGH (ref 0.0–2.0)
Acid-base deficit: 5 mmol/L — ABNORMAL HIGH (ref 0.0–2.0)
Acid-base deficit: 2 mmol/L (ref 0.0–2.0)
Bicarbonate: 22.5 mmol/L (ref 20.0–28.0)
Bicarbonate: 21.6 mmol/L (ref 20.0–28.0)
Bicarbonate: 22.7 mmol/L (ref 20.0–28.0)
Bicarbonate: 21.5 mmol/L (ref 20.0–28.0)
Bicarbonate: 24.5 mmol/L (ref 20.0–28.0)
Calcium, Ion: 1.02 mmol/L — ABNORMAL LOW (ref 1.15–1.40)
Calcium, Ion: 1.13 mmol/L — ABNORMAL LOW (ref 1.15–1.40)
Calcium, Ion: 1.16 mmol/L (ref 1.15–1.40)
Calcium, Ion: 1.16 mmol/L (ref 1.15–1.40)
Calcium, Ion: 1.11 mmol/L — ABNORMAL LOW (ref 1.15–1.40)
HCT: 26 % — ABNORMAL LOW (ref 36.0–46.0)
HCT: 23 % — ABNORMAL LOW (ref 36.0–46.0)
HCT: 23 % — ABNORMAL LOW (ref 36.0–46.0)
HCT: 23 % — ABNORMAL LOW (ref 36.0–46.0)
HCT: 22 % — ABNORMAL LOW (ref 36.0–46.0)
Hemoglobin: 8.8 g/dL — ABNORMAL LOW (ref 12.0–15.0)
Hemoglobin: 7.8 g/dL — ABNORMAL LOW (ref 12.0–15.0)
Hemoglobin: 7.8 g/dL — ABNORMAL LOW (ref 12.0–15.0)
Hemoglobin: 7.8 g/dL — ABNORMAL LOW (ref 12.0–15.0)
Hemoglobin: 7.5 g/dL — ABNORMAL LOW (ref 12.0–15.0)
O2 Saturation: 100 %
O2 Saturation: 98 %
O2 Saturation: 97 %
O2 Saturation: 99 %
O2 Saturation: 98 %
Patient temperature: 35.8
Patient temperature: 37.1
Patient temperature: 99
Patient temperature: 98.9
Patient temperature: 98.9
Potassium: 3.5 mmol/L (ref 3.5–5.1)
Potassium: 4.8 mmol/L (ref 3.5–5.1)
Potassium: 4 mmol/L (ref 3.5–5.1)
Potassium: 4.1 mmol/L (ref 3.5–5.1)
Potassium: 4.1 mmol/L (ref 3.5–5.1)
Sodium: 141 mmol/L (ref 135–145)
Sodium: 141 mmol/L (ref 135–145)
Sodium: 142 mmol/L (ref 135–145)
Sodium: 142 mmol/L (ref 135–145)
Sodium: 144 mmol/L (ref 135–145)
TCO2: 24 mmol/L (ref 22–32)
TCO2: 23 mmol/L (ref 22–32)
TCO2: 24 mmol/L (ref 22–32)
TCO2: 23 mmol/L (ref 22–32)
TCO2: 26 mmol/L (ref 22–32)
pCO2 arterial: 33.4 mmHg (ref 32.0–48.0)
pCO2 arterial: 50.6 mmHg — ABNORMAL HIGH (ref 32.0–48.0)
pCO2 arterial: 51.8 mmHg — ABNORMAL HIGH (ref 32.0–48.0)
pCO2 arterial: 47.1 mmHg (ref 32.0–48.0)
pCO2 arterial: 48.8 mmHg — ABNORMAL HIGH (ref 32.0–48.0)
pH, Arterial: 7.431 (ref 7.350–7.450)
pH, Arterial: 7.24 — ABNORMAL LOW (ref 7.350–7.450)
pH, Arterial: 7.25 — ABNORMAL LOW (ref 7.350–7.450)
pH, Arterial: 7.269 — ABNORMAL LOW (ref 7.350–7.450)
pH, Arterial: 7.309 — ABNORMAL LOW (ref 7.350–7.450)
pO2, Arterial: 231 mmHg — ABNORMAL HIGH (ref 83.0–108.0)
pO2, Arterial: 122 mmHg — ABNORMAL HIGH (ref 83.0–108.0)
pO2, Arterial: 108 mmHg (ref 83.0–108.0)
pO2, Arterial: 170 mmHg — ABNORMAL HIGH (ref 83.0–108.0)
pO2, Arterial: 119 mmHg — ABNORMAL HIGH (ref 83.0–108.0)

## 2021-07-29 LAB — BASIC METABOLIC PANEL
Anion gap: 6 (ref 5–15)
Anion gap: 5 (ref 5–15)
BUN: 7 mg/dL — ABNORMAL LOW (ref 8–23)
BUN: 7 mg/dL — ABNORMAL LOW (ref 8–23)
CO2: 24 mmol/L (ref 22–32)
CO2: 23 mmol/L (ref 22–32)
Calcium: 7.2 mg/dL — ABNORMAL LOW (ref 8.9–10.3)
Calcium: 7.7 mg/dL — ABNORMAL LOW (ref 8.9–10.3)
Chloride: 110 mmol/L (ref 98–111)
Chloride: 110 mmol/L (ref 98–111)
Creatinine, Ser: 0.79 mg/dL (ref 0.44–1.00)
Creatinine, Ser: 0.81 mg/dL (ref 0.44–1.00)
GFR, Estimated: >60 mL/min (ref >60)
GFR, Estimated: >60 mL/min (ref >60)
Glucose, Bld: 103 mg/dL — ABNORMAL HIGH (ref 70–99)
Glucose, Bld: 149 mg/dL — ABNORMAL HIGH (ref 70–99)
Potassium: 4.9 mmol/L (ref 3.5–5.1)
Potassium: 4.2 mmol/L (ref 3.5–5.1)
Sodium: 140 mmol/L (ref 135–145)
Sodium: 138 mmol/L (ref 135–145)

## 2021-07-29 LAB — SURGICAL PCR SCREEN
MRSA, PCR: NEGATIVE
Staphylococcus aureus: NEGATIVE

## 2021-07-29 LAB — HEMOGLOBIN AND HEMATOCRIT, BLOOD
HCT: 22.1 % — ABNORMAL LOW (ref 36.0–46.0)
Hemoglobin: 7.2 g/dL — ABNORMAL LOW (ref 12.0–15.0)

## 2021-07-29 LAB — GLUCOSE, CAPILLARY
Glucose-Capillary: 102 mg/dL — ABNORMAL HIGH (ref 70–99)
Glucose-Capillary: 105 mg/dL — ABNORMAL HIGH (ref 70–99)
Glucose-Capillary: 108 mg/dL — ABNORMAL HIGH (ref 70–99)
Glucose-Capillary: 108 mg/dL — ABNORMAL HIGH (ref 70–99)
Glucose-Capillary: 112 mg/dL — ABNORMAL HIGH (ref 70–99)
Glucose-Capillary: 124 mg/dL — ABNORMAL HIGH (ref 70–99)
Glucose-Capillary: 126 mg/dL — ABNORMAL HIGH (ref 70–99)
Glucose-Capillary: 152 mg/dL — ABNORMAL HIGH (ref 70–99)
Glucose-Capillary: 99 mg/dL (ref 70–99)

## 2021-07-29 LAB — POCT ACTIVATED CLOTTING TIME: Activated Clotting Time: 121 seconds

## 2021-07-29 LAB — MAGNESIUM
Magnesium: 3 mg/dL — ABNORMAL HIGH (ref 1.7–2.4)
Magnesium: 2.2 mg/dL (ref 1.7–2.4)

## 2021-07-29 MED ORDER — CHLORHEXIDINE GLUCONATE 0.12% ORAL RINSE (MEDLINE KIT)
15.0000 mL | Freq: Two times a day (BID) | OROMUCOSAL | Status: DC
  Administered 2021-07-29 – 2021-07-30 (×2): 15 mL via OROMUCOSAL

## 2021-07-29 MED ORDER — SODIUM BICARBONATE 8.4 % IV SOLN
50.0000 meq | Freq: Once | INTRAVENOUS | Status: AC
Start: 2021-07-29 — End: 2021-07-29
  Administered 2021-07-29: 50 meq via INTRAVENOUS

## 2021-07-29 MED ORDER — ALBUMIN HUMAN 25 % IV SOLN
12.5000 g | Freq: Once | INTRAVENOUS | Status: AC
  Administered 2021-07-29: 12.5 g via INTRAVENOUS
  Filled 2021-07-29: qty 50

## 2021-07-29 MED ORDER — INSULIN ASPART 100 UNIT/ML IJ SOLN
0.0000 [IU] | INTRAMUSCULAR | Status: DC
Start: 2021-07-29 — End: 2021-07-30
  Administered 2021-07-29 – 2021-07-30 (×4): 2 [IU] via SUBCUTANEOUS

## 2021-07-29 MED ORDER — ALBUTEROL SULFATE (2.5 MG/3ML) 0.083% IN NEBU
2.5000 mg | INHALATION_SOLUTION | Freq: Four times a day (QID) | RESPIRATORY_TRACT | Status: DC
  Administered 2021-07-29 (×3): 2.5 mg via RESPIRATORY_TRACT
  Filled 2021-07-29 (×3): qty 3

## 2021-07-29 MED ORDER — ALBUTEROL SULFATE (2.5 MG/3ML) 0.083% IN NEBU
INHALATION_SOLUTION | RESPIRATORY_TRACT | Status: AC
  Administered 2021-07-29: 2.5 mg via RESPIRATORY_TRACT
  Filled 2021-07-29: qty 3

## 2021-07-29 MED ORDER — CHLORHEXIDINE GLUCONATE CLOTH 2 % EX PADS
6.0000 | MEDICATED_PAD | Freq: Every day | CUTANEOUS | Status: DC
  Administered 2021-07-29 – 2021-08-01 (×4): 6 via TOPICAL

## 2021-07-29 MED ORDER — ENOXAPARIN SODIUM 40 MG/0.4ML IJ SOSY
40.0000 mg | PREFILLED_SYRINGE | Freq: Every day | INTRAMUSCULAR | Status: DC
  Administered 2021-07-29 – 2021-08-02 (×5): 40 mg via SUBCUTANEOUS
  Filled 2021-07-29 (×5): qty 0.4

## 2021-07-29 MED ORDER — SODIUM BICARBONATE 8.4 % IV SOLN
50.0000 meq | Freq: Once | INTRAVENOUS | Status: AC
  Administered 2021-07-29: 50 meq via INTRAVENOUS

## 2021-07-29 MED ORDER — ORAL CARE MOUTH RINSE
15.0000 mL | Freq: Four times a day (QID) | OROMUCOSAL | Status: DC
  Administered 2021-07-29 – 2021-08-03 (×5): 15 mL via OROMUCOSAL

## 2021-07-29 MED ORDER — ORAL CARE MOUTH RINSE
15.0000 mL | OROMUCOSAL | Status: DC
  Administered 2021-07-29: 15 mL via OROMUCOSAL

## 2021-07-29 NOTE — Progress Notes (Signed)
Called to 2H05 for r radial sheath removal and R Hand TR band placement.TR band in place with no hematoma noted to r wrist or f/a area. Slight radial palpable with sheath in place. 6 fr sheath removed with cath tip intact from r radail area. 11 cc of air placed in TR band - with placement unremarkable. RN at bedside. R hand neurovascular status intact; cap refill 3 seconds or less; no oozing from arteriotomy site, slight oozing from suture site just distal to TR band, RN awar. SOP2 wave form crisp, with no radial artery occlusion noted by barbeu / reverse barbeu test, and slight radial pulse palpation. Patient left in stable condition in the care of RN. Without r hand neurovascular compromise noted.

## 2021-07-29 NOTE — Discharge Summary (Addendum)
[address]       [place] [address]             [phone]    Physician Discharge Summary  Patient ID: Marissa Lee MRN: [id number] DOB/AGE: 12/01/1950 70 y.o.  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 female with no prior cardiac history presented to ED with recurrent CP over past 48 hours. ECG changes noted in ED. Troponins negative.  She was admitted for further work up.  Cardiac catheterization performed on 07/28/2021 showed critical left main stenosis with remainder of coronaries normal.  She experienced chest pain during the procedure and IABP was inserted.  It was felt emergent coronary bypass grafting would be indicated and Cardiac surgery consultation was obtained.  She was evaluated by Dr. Roxan Hockey at which time her chest pain had improved.  He was in agreement the patient would benefit from coronary bypass grafting. The risks and benefits of the procedure were explained to the patient and she was agreeable to proceed.  Hospital Course:  She was taken to the operating room  and underwent Emergent CABG x 2 utilizing LIMA to LAD and SVG to OM  She underwent endoscopic harvest of greater saphenous vein from her left thigh.  She tolerated the procedure without difficulty and was taken to the SICU in stable condition.  The patient was weaned off Neo as hemodynamics allowed.  Her IABP was removed on POD #1.  She had hypercarbic acute respiratory failure and she was weaned off ventilator and extubated on 07/29/2021.  Her chest tubes and arterial lines were removed without difficulty.  The patient had nausea with gastric dilatation on CXR.  She was treated with IV reglan.  She developed itching post tramadol use.  This was discontinued and she was treated with Toradol in place.  She underwent debridement of her central line.  She was maintaining NSR and felt stable for transfer to the progressive care unit on 07/31/2021.  Patient wires were removed routinely on 08/01/2021 and Plavix was started on the following day for acute coronary syndrome. She was felt surgically stable for transfer from the ICU;however, a bed was not available until 11/07. She has been ambulating with good oxygenation on room air. PA/LAT CXR showed moderate left pleural effusion. Diuretics were continued and as discussed with Dr. Roxan Hockey, no need for left thoracentesis at this time. She will be seen in the office in one week with a PA/LAT CXR to monitor effusion.  She is having some difficulty expectorating sputum so she was given Mucinex. She is tolerating a diet and has had a bowel movement. Her  wounds are clean, dry, and healing without signs of infection. Her systolic BP is increasing so she was started on Lisinopril 5 mg daily. She asked for an explanation of hospital course and this was discussed at length with her. She was also instructed she can view her medical record via my chart or request medical records. At patient, nurse request, PT consult was obtained an home PT was recommended and was ordered. As  discussed with Dr. Roxan Hockey, she is felt surgically stable for discharge today.  Consults: None  Significant Diagnostic Studies: angiography:     Ost LM to Mid LM lesion is 90% stenosed.   The left ventricular systolic function is normal.   LV end diastolic pressure is normal.   The left ventricular ejection fraction is 55-65% by visual estimate.   There is no aortic valve stenosis.   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 Normal/preserved LVEF with normal EDP. Successful placement of 50 cc IABP pump via RFA 8 French sheath  Treatments: surgery:   Operative Report    DATE OF PROCEDURE: 07/28/2021     PREOPERATIVE DIAGNOSIS:  Critical left main disease.   POSTOPERATIVE DIAGNOSIS:  Critical left main disease.   PROCEDURE PERFORMED:  Median sternotomy, extracorporeal circulation, coronary artery bypass grafting x 2 (left internal mammary artery to LAD, saphenous vein graft to obtuse marginal 1), endoscopic vein harvest right thigh.   SURGEON:  Revonda Standard. Roxan Hockey, MD   ASSISTANT:  Ellwood Handler, PA-C   Discharge Exam: Blood pressure (!) 160/71, pulse 72, temperature 97.9 F (36.6 C), temperature source Oral, resp. rate 19, height 5\' 4"  (1.626 m), weight 66.5 kg, last menstrual period 09/27/2003, SpO2 99 %. Cardiovascular: RRR Pulmonary: Slightly diminished bibasilar breath sounds L>R Abdomen: Soft, non tender, bowel sounds present. Extremities: Mild bilateral lower extremity edema. Wounds: Dressing removed and sternal wound is clean and dry.  No erythema or signs of infection.   Discharge Medications:  The patient has been discharged on:   1.Beta Blocker:  Yes [ X  ]                              No   [   ]                              If No, reason:  2.Ace Inhibitor/ARB: Yes [  x ]                                      No  [    ]                                     If No, reason:  3.Statin:   Yes [ X  ]                  No  [   ]                  If No, reason:  4.Ecasa:  Yes  [ X  ]  No   [   ]                  If No, reason:  Patient had ACS upon admission:  Plavix/P2Y12 inhibitor: Yes [  x ]                                      No  [   ]      Allergies as of 08/03/2021       Reactions   Cephalosporins Anaphylaxis, Swelling   Throat swells   Clindamycin/lincomycin    Metallic taste, blisters behind ears   Codeine Itching        Medication List     STOP taking these medications    doxycycline 20 MG tablet Commonly known as: PERIOSTAT   ELDERBERRY PO   gabapentin 100 MG capsule Commonly known as: NEURONTIN   meloxicam 7.5 MG tablet Commonly known as: MOBIC       TAKE these medications    acetaminophen 500 MG tablet Commonly known as: TYLENOL Take 1,000 mg by mouth every 6 (six) hours as needed for moderate pain or headache.   albuterol 108 (90 Base) MCG/ACT inhaler Commonly known as: VENTOLIN HFA Inhale 2 puffs into the lungs every 6 (six) hours as needed for shortness of breath or wheezing.   aspirin 81 MG EC tablet Take 1 tablet (81 mg total) by mouth daily. Swallow whole.   atorvastatin 40 MG tablet Commonly known as: LIPITOR Take 1 tablet (40 mg total) by mouth daily. Start taking on: August 04, 2021   CITRACAL +D3 PO Take by mouth daily.   clopidogrel 75 MG tablet Commonly known as: PLAVIX Take 1 tablet (75 mg total) by mouth daily. Start taking on: August 04, 2021   ezetimibe 10 MG tablet Commonly known as: Zetia Take 1 tablet (10 mg total) by mouth daily.   ferrous sulfate 325 (65 FE) MG tablet Take 1 tablet (325 mg total) by mouth daily with breakfast. For one month then stop.   FLUoxetine 40 MG capsule Commonly known as: PROZAC TAKE ONE CAPSULE BY MOUTH EVERY DAY What changed:  how much to  take how to take this when to take this additional instructions   furosemide 40 MG tablet Commonly known as: Lasix Take 1 tablet (40 mg total) by mouth daily. For 10 days then stop   GLUCOSE PO Take 1 tablet by mouth daily as needed. When feels like sugar is low   guaiFENesin 600 MG 12 hr tablet Commonly known as: MUCINEX Take 1 tablet (600 mg total) by mouth 2 (two) times daily as needed for to loosen phlegm or cough.   lisinopril 5 MG tablet Commonly known as: ZESTRIL Take 1 tablet (5 mg total) by mouth daily. Start taking on: August 04, 2021   melatonin 5 MG Tabs Take 5 mg by mouth at bedtime as needed (sleep).   metoprolol succinate 25 MG 24 hr tablet Commonly known as: TOPROL-XL Take 1 tablet (25 mg total) by mouth daily. Start taking on: August 04, 2021   potassium chloride SA 20 MEQ tablet Commonly known as: KLOR-CON Take 1 tablet (20 mEq total) by mouth daily. For ten days then stop Start taking on: August 04, 2021   traMADol 50 MG tablet Commonly known as: Ultram Take 1 tablet (50 mg total) by mouth every 6 (six) hours as needed.  Follow-up Information     [name], MD. Go on 08/24/2021.   Specialty: Cardiothoracic Surgery Why: Your appointment is at 11am.  Please arrive 30 minutes early for a CXR to be performed by [facility] located on the first floor of the same building. Contact information: [place] [address] [phone]         Adrian Prows, MD Follow up on 08/12/2021.   Specialty: Cardiology Why: Your cardiology follow up appointment is at 9:30am.  You are requested to arrive 30 minutes early. Contact information: [place] [address] [phone]         [facility] Follow up on 08/10/2021.   Specialty: Cardiothoracic Surgery Why: PA/LAT CXR to be taken (at [facility] which is in the same  building as Dr. Leonarda Salon office on ground floor)to minotr left pleural effusion 11/15 at 10:30 am;Appointment time is at 11:00 am with physician assistant Contact information: [address] [place], [place] [phone]                Signed: Lars Pinks PA-C 08/03/2021, 10:38 AM

## 2021-07-29 NOTE — Plan of Care (Signed)
  Problem: Clinical Measurements: Goal: Diagnostic test results will improve Outcome: Progressing Goal: Cardiovascular complication will be avoided Outcome: Progressing   Problem: Pain Managment: Goal: General experience of comfort will improve Outcome: Progressing   Problem: Cardiac: Goal: Will achieve and/or maintain hemodynamic stability Outcome: Progressing   Problem: Urinary Elimination: Goal: Ability to achieve and maintain adequate renal perfusion and functioning will improve Outcome: Progressing

## 2021-07-29 NOTE — Op Note (Signed)
NAMEJACKQULYN, MENDEL MEDICAL RECORD NO: [id number] ACCOUNT NO: [id number] DATE OF BIRTH: October 31, 1950 FACILITY: [facility] LOCATION: [facility] PHYSICIAN: Revonda Standard. Roxan Hockey, MD  Operative Report   DATE OF PROCEDURE: 07/28/2021   PREOPERATIVE DIAGNOSIS:  Critical left main disease.  POSTOPERATIVE DIAGNOSIS:  Critical left main disease.  PROCEDURE PERFORMED:   Median sternotomy, extracorporeal circulation, Coronary artery bypass grafting x 2  Left internal mammary artery to LAD,  Saphenous vein graft to obtuse marginal 1, Endoscopic vein harvest right thigh.  SURGEON:  Revonda Standard. Roxan Hockey, MD  ASSISTANT:  Ellwood Handler, PA  ANESTHESIA:  General.  FINDINGS:  Transesophageal echocardiography showed preserved left ventricular function with no significant valvular pathology.  Good quality targets, good quality conduits.  CLINICAL NOTE:  Mrs. Maietta is a 70 year old woman with no prior cardiac disease, who presented with waxing and waning chest pain.  She underwent cardiac catheterization this afternoon, which revealed a critical left main stenosis.  She was still having  some chest pain in the catheterization laboratory.  An intraaortic balloon pump was placed.  The patient was advised to undergo emergent coronary artery bypass grafting.  The indications, risks, benefits, and alternatives were discussed in detail with  the patient and her daughter.  They understood and accepted the risks and agreed to proceed.  DESCRIPTION OF PROCEDURE:  The patient was brought to the operating room on 07/28/2021.  She had intraaortic balloon pump in place and was chest pain free on arrival.  Anesthesia established central venous access and placed an arterial blood pressure  monitoring line.  She was anesthetized and intubated.  A Foley catheter was placed.  Intravenous antibiotics were administered.  Dr. Belinda Block of Anesthesia performed transesophageal echocardiography.  There was no valvular pathology and  there was  preserved left ventricular function.  The chest, abdomen and legs were prepped and draped in the usual sterile fashion.  A median sternotomy was performed and the left internal mammary artery was harvested using standard technique.  Simultaneously, an incision was made in the medial aspect of the right leg at the level of the knee.  The greater saphenous vein was harvested  from the right thigh.  Both the mammary and the vein were good quality vessels.  After harvesting of the conduits, the full heparin dose was administered.  A sternal retractor was placed and was opened.  The lungs were relatively hyperinflated.  The pericardium was opened.  The ascending aorta was inspected.  There was no evidence of  atherosclerotic disease.  After confirming adequate anticoagulation with ACT measurement, the aorta was cannulated via concentric 2-0 Ethibond pledgeted pursestring sutures.  A dual stage venous cannula was placed via a pursestring suture in the right atrial appendage.  Cardiopulmonary bypass was instituted.  Flows were maintained per protocol.  The patient was cooled to 34 degrees Celsius.  The coronary arteries were inspected.  The anastomotic sites were chosen.  The conduits were inspected and cut to length.  A foam pad was placed in the pericardium to  insulate the heart.  A temperature probe was placed in the myocardial septum and a cardioplegia cannula was placed in the ascending aorta.  The aorta was cross clamped.  The left ventricle was emptied via the aortic root vent.  Cardiac arrest was achieved with a combination of cold antegrade blood cardioplegia and topical iced saline.  800 mL of cardioplegia was administered.  There was rapid  diastolic arrest and there was septal cooling to 10 degrees Celsius.  A reversed  saphenous vein graft was placed end to side to the first obtuse marginal branch of the left circumflex.  This was the only anterolateral branch, it was a 1.5 mm good  quality target vessel.  The vein was of good quality.  It was anastomosed  end-to-side with a running 7-0 Prolene suture.  A probe passed easily proximally and distally.  Cardioplegia was administered down the graft and there was good flow and good hemostasis.  The left internal mammary artery was brought through a window in the pericardium.  The distal end was bevelled.  It was anastomosed end-to-side to the LAD.  The LAD was superficially intramyocardial.  It was a 1.5 mm good quality target vessel.  The  mammary was 1.5 mm good quality conduit.  The end-to-side anastomosis was performed with a running 8-0 Prolene suture.  At the completion of the mammary to LAD anastomosis, the bulldog clamp was briefly removed to inspect for hemostasis, septal rewarming  was noted.  The bulldog clamp was replaced.  Additional cardioplegia was administered down the aortic root.  The vein graft was cut to length.  Cardioplegia cannula was removed from the ascending aorta.  The proximal vein graft anastomosis was performed to a 4.5 mm punch aortotomy with a  running 6-0 Prolene suture.  At the completion of the final proximal anastomosis, the patient was placed in Trendelenburg position.  Lidocaine was administered.  The aortic root was de-aired and the aortic crossclamp was removed.  Total crossclamp time  was 40 minutes.  The patient required a single defibrillation with 10 joules.  There was a bradycardic rhythm.  While rewarming was completed, all proximal and distal anastomoses were inspected for hemostasis.  Epicardial pacing wires were  placed on the right ventricle and right atrium.  When the patient had rewarmed to a core temperature of 37 degrees Celsius she was weaned from cardiopulmonary bypass on the first attempt.  Total bypass time was 60 minutes.  A test dose of protamine was administered.  The atrial and aortic cannula were removed.  The remainder of the protamine was administered without incident.   Chest was copiously irrigated with warm saline.  Hemostasis was achieved.  Post-bypass  transesophageal echocardiography was unchanged from the prebypass study and her cardiac index remained greater than 2 liters per minute per meter squared by the flow track.  The pericardium was reapproximated over the ascending aorta with interrupted 3-0  silk sutures.  It came together easily without tension.  Left pleural and mediastinal chest tubes were placed through separate subcostal incisions.  The sternum was closed with a combination of single and double heavy gauge stainless steel wires.   Pectoralis fascia, subcutaneous tissue and skin were closed in standard fashion.  All sponge, needle and instrument counts were correct at the end of the procedure, the patient was taken from the operating room to the surgical intensive care unit,  intubated in good condition.   Erin Barrett served as Licensed conveyancer for this case, harvesting the vein and providing retraction, exposure, suturing and other services as needed.   SUJ D: 07/28/2021 10:40:59 pm T: 07/29/2021 1:58:00 am  JOB: [id number]

## 2021-07-29 NOTE — Progress Notes (Signed)
Progress Note  Patient Name: Marissa Lee Date of Encounter: 07/29/2021  Primary Cardiologist: None   Subjective   Patient seen and examined at her bedside. She is status post CABG x2.  Day 1.  Inpatient Medications    Scheduled Meds:  acetaminophen  1,000 mg Oral Q6H   Or   acetaminophen (TYLENOL) oral liquid 160 mg/5 mL  1,000 mg Per Tube Q6H   albuterol  2.5 mg Nebulization Q6H   aspirin EC  325 mg Oral Daily   Or   aspirin  324 mg Per Tube Daily   atorvastatin  80 mg Oral Daily   bisacodyl  10 mg Oral Daily   Or   bisacodyl  10 mg Rectal Daily   chlorhexidine gluconate (MEDLINE KIT)  15 mL Mouth Rinse BID   Chlorhexidine Gluconate Cloth  6 each Topical Daily   docusate sodium  200 mg Oral Daily   enoxaparin (LOVENOX) injection  40 mg Subcutaneous QHS   ezetimibe  10 mg Oral Daily   FLUoxetine  40 mg Oral Daily   insulin aspart  0-24 Units Subcutaneous Q4H   mouth rinse  15 mL Mouth Rinse 10 times per day   metoprolol tartrate  12.5 mg Oral BID   Or   metoprolol tartrate  12.5 mg Per Tube BID   [START ON 07/30/2021] pantoprazole  40 mg Oral Daily   sodium bicarbonate  50 mEq Intravenous Once   sodium chloride flush  3 mL Intravenous Q12H   Continuous Infusions:  sodium chloride 20 mL/hr at 07/29/21 0800   sodium chloride 250 mL (07/29/21 0900)   sodium chloride     albumin human 12.5 g (07/29/21 0215)   dexmedetomidine (PRECEDEX) IV infusion 0.1 mcg/kg/hr (07/29/21 0800)   famotidine (PEPCID) IV Stopped (07/29/21 0035)   lactated ringers     lactated ringers     lactated ringers 20 mL/hr at 07/29/21 0800   nitroGLYCERIN     phenylephrine (NEO-SYNEPHRINE) Adult infusion 50 mcg/min (07/29/21 0924)   PRN Meds: sodium chloride, albumin human, lactated ringers, metoprolol tartrate, midazolam, morphine injection, ondansetron (ZOFRAN) IV, oxyCODONE, sodium chloride flush, traMADol   Vital Signs    Vitals:   07/29/21 0800 07/29/21 0842 07/29/21 0918 07/29/21  0944  BP: (!) 104/26     Pulse: (!) 33 (!) 29  88  Resp: (!) '23 14  14  ' Temp:      TempSrc:      SpO2: 98% 100% 100% 100%  Weight:      Height:        Intake/Output Summary (Last 24 hours) at 07/29/2021 1023 Last data filed at 07/29/2021 0800 Gross per 24 hour  Intake 4658.03 ml  Output 2965 ml  Net 1693.03 ml   Filed Weights   07/27/21 1717 07/29/21 0500  Weight: 63.5 kg 63.8 kg    Telemetry    Sinus rhythm- Personally Reviewed  ECG    Sinus rhythm, heart rate 82 bpm- Personally Reviewed  Physical Exam   GEN: Intubated, however awake and alert Neck: Central line in place right IJ Cardiac: RRR, no murmurs, rubs, or gallops.  Chest: Chest tube in place Respiratory: Clear to auscultation bilaterally. GI: Soft, nontender, non-distended  MS: No edema; No deformity. Neuro:  Nonfocal  Psych: Normal affect  Still with a Foley catheter  Labs    Chemistry Recent Labs  Lab 07/27/21 1720 07/28/21 0238 07/28/21 1906 07/28/21 2054 07/28/21 2059 07/28/21 2136 07/28/21 2254 07/29/21 0320 07/29/21 0324 07/29/21  7902  NA 139 137   < > 136   < > 139   < > 140 141 142  K 3.6 3.5   < > 4.7   < > 4.3   < > 4.9 4.8 4.0  CL 105 105   < > 102  --  104  --  110  --   --   CO2 27 24  --   --   --   --   --  24  --   --   GLUCOSE 107* 97   < > 100*  --  106*  --  103*  --   --   BUN 9 9   < > 6*  --  5*  --  7*  --   --   CREATININE 0.96 0.75   < > 0.40*  --  0.50  --  0.79  --   --   CALCIUM 9.0 8.6*  --   --   --   --   --  7.2*  --   --   GFRNONAA >60 >60  --   --   --   --   --  >60  --   --   ANIONGAP 7 8  --   --   --   --   --  6  --   --    < > = values in this interval not displayed.     Hematology Recent Labs  Lab 07/28/21 0238 07/28/21 1906 07/28/21 2052 07/28/21 2054 07/28/21 2253 07/28/21 2254 07/29/21 0320 07/29/21 0324 07/29/21 0623  WBC 6.0  --   --   --  8.6  --  7.6  --   --   RBC 4.12  --   --   --  2.85*  --  2.67*  --   --   HGB 13.0   < >  7.2*   < > 9.2*   < > 8.6* 7.8* 7.8*  HCT 40.1   < > 22.1*   < > 27.2*   < > 26.2* 23.0* 23.0*  MCV 97.3  --   --   --  95.4  --  98.1  --   --   MCH 31.6  --   --   --  32.3  --  32.2  --   --   MCHC 32.4  --   --   --  33.8  --  32.8  --   --   RDW 12.9  --   --   --  12.7  --  12.8  --   --   PLT 289  --  161  --  130*  --  152  --   --    < > = values in this interval not displayed.    Cardiac EnzymesNo results for input(s): TROPONINI in the last 168 hours. No results for input(s): TROPIPOC in the last 168 hours.   BNPNo results for input(s): BNP, PROBNP in the last 168 hours.   DDimer No results for input(s): DDIMER in the last 168 hours.   Radiology    DG Chest 2 View  Result Date: 07/27/2021 CLINICAL DATA:  Chest pain. EXAM: CHEST - 2 VIEW COMPARISON:  Chest radiograph dated 10/10/2016. FINDINGS: The heart size and mediastinal contours are within normal limits. Both lungs are clear. The visualized skeletal structures are unremarkable. IMPRESSION: No active cardiopulmonary disease. Electronically Signed   By: Milas Hock  Radparvar M.D.   On: 07/27/2021 19:09   CARDIAC CATHETERIZATION  Result Date: 07/28/2021 Images from the original result were not included.   Ost LM to Mid LM lesion is 90% stenosed.   The left ventricular systolic function is normal.   LV end diastolic pressure is normal.   The left ventricular ejection fraction is 55-65% by visual estimate.   There is no aortic valve stenosis. 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 Normal/preserved LVEF with normal EDP. Successful placement of 50 cc IABP pump via RFA 8 French sheath RECOMMENDATIONS Patient be taken directly to the OR for emergent CABG with Dr. Roxan Hockey. Postoperatively will need high-dose high intensity statin, beta-blocker and aspirin. With ACS  presentation, consider Plavix prior to discharge. 2D echocardiogram performed, pending results. Glenetta Hew, MD Glenetta Hew, M.D., M.S. Interventional Cardiologist Pager # [phone] Phone # [phone] [address] [place], [place] [address]   DG Chest Port 1 View  Result Date: 07/29/2021 CLINICAL DATA:  Post op open heart surgery EXAM: PORTABLE CHEST 1 VIEW COMPARISON:  07/28/2021 FINDINGS: Endotracheal tube in good position. Central venous catheter tip in the SVC. Bilateral chest tubes remain in place. NG tube in the stomach. Progression of bibasilar atelectasis left greater than right. No significant effusion or edema. Negative for pneumothorax IMPRESSION: Support lines in good position.  No pneumothorax Progression of bibasilar atelectasis left greater than right. Electronically Signed   By: Franchot Gallo M.D.   On: 07/29/2021 08:47   DG Chest Port 1 View  Result Date: 07/28/2021 CLINICAL DATA:  Status post CABG x2. EXAM: PORTABLE CHEST 1 VIEW COMPARISON:  Chest x-ray 07/27/2021. FINDINGS: There are new sternotomy wires and postsurgical changes in the mediastinum. Endotracheal tube tip is 3 cm above the carina. Right-sided central venous catheter tip projects over the mid SVC. Enteric tube extends below the diaphragm. Mediastinal and left pleural drains are in place. Lungs and costophrenic angles are clear. No pneumothorax. Cardiomediastinal contour within normal limits. IMPRESSION: 1. Line/tubes and postsurgical changes as above. 2. The lungs are clear. Electronically Signed   By: Ronney Asters M.D.   On: 07/28/2021 23:23   ECHOCARDIOGRAM COMPLETE  Result Date: 07/28/2021    ECHOCARDIOGRAM REPORT   Patient Name:   KARLEI WALDO Date of Exam: 07/28/2021 Medical Rec #:  [id number]    Height:       64.0 in Accession #:    [id number]   Weight:       140.0 lb Date of Birth:  1951/06/02    BSA:          1.681 m Patient Age:    62 years     BP:           134/61 mmHg Patient Gender: F             HR:           57 bpm. Exam Location:  Inpatient Procedure: 2D Echo, Cardiac Doppler and Color Doppler Indications:    R07.9* Chest pain, unspecified  History:        Patient has prior history of Echocardiogram examinations, most                 recent 01/29/2016.  Sonographer:    Bernadene Person RDCS Referring Phys: Startup  1. Left ventricular ejection fraction, by estimation,  is 60 to 65%. The left ventricle has normal function. The left ventricle has no regional wall motion abnormalities. Left ventricular diastolic parameters are indeterminate.  2. Right ventricular systolic function is normal. The right ventricular size is normal. There is normal pulmonary artery systolic pressure.  3. Left atrial size was mildly dilated.  4. The mitral valve is normal in structure. No evidence of mitral valve regurgitation.  5. The aortic valve is normal in structure. Aortic valve regurgitation is not visualized. No aortic stenosis is present.  6. The inferior vena cava is normal in size with greater than 50% respiratory variability, suggesting right atrial pressure of 3 mmHg. Comparison(s): No prior Echocardiogram. Conclusion(s)/Recommendation(s): Normal biventricular function without evidence of hemodynamically significant valvular heart disease. FINDINGS  Left Ventricle: Left ventricular ejection fraction, by estimation, is 60 to 65%. The left ventricle has normal function. The left ventricle has no regional wall motion abnormalities. The left ventricular internal cavity size was normal in size. There is  no left ventricular hypertrophy. Left ventricular diastolic parameters are indeterminate. Right Ventricle: The right ventricular size is normal. No increase in right ventricular wall thickness. Right ventricular systolic function is normal. There is normal pulmonary artery systolic pressure. The tricuspid regurgitant velocity is 1.75 m/s, and  with an assumed right atrial pressure of 3 mmHg, the  estimated right ventricular systolic pressure is 91.4 mmHg. Left Atrium: Left atrial size was mildly dilated. Right Atrium: Right atrial size was normal in size. Pericardium: There is no evidence of pericardial effusion. Mitral Valve: The mitral valve is normal in structure. No evidence of mitral valve regurgitation. Tricuspid Valve: The tricuspid valve is normal in structure. Tricuspid valve regurgitation is not demonstrated. Aortic Valve: The aortic valve is normal in structure. Aortic valve regurgitation is not visualized. No aortic stenosis is present. Pulmonic Valve: The pulmonic valve was not well visualized. Pulmonic valve regurgitation is not visualized. Aorta: The aortic root and ascending aorta are structurally normal, with no evidence of dilitation. Venous: The inferior vena cava is normal in size with greater than 50% respiratory variability, suggesting right atrial pressure of 3 mmHg. IAS/Shunts: No atrial level shunt detected by color flow Doppler.  LEFT VENTRICLE PLAX 2D LVIDd:         4.00 cm   Diastology LVIDs:         2.20 cm   LV e' medial:    5.75 cm/s LV PW:         0.90 cm   LV E/e' medial:  14.9 LV IVS:        0.60 cm   LV e' lateral:   8.37 cm/s LVOT diam:     2.10 cm   LV E/e' lateral: 10.3 LV SV:         78 LV SV Index:   47 LVOT Area:     3.46 cm  RIGHT VENTRICLE RV S prime:     13.40 cm/s TAPSE (M-mode): 1.8 cm LEFT ATRIUM             Index        RIGHT ATRIUM           Index LA diam:        3.50 cm 2.08 cm/m   RA Area:     13.60 cm LA Vol (A2C):   37.9 ml 22.54 ml/m  RA Volume:   30.30 ml  18.02 ml/m LA Vol (A4C):   44.1 ml 26.23 ml/m LA Biplane Vol: 42.9 ml 25.52 ml/m  AORTIC VALVE LVOT Vmax:   101.00 cm/s LVOT Vmean:  66.400 cm/s LVOT VTI:    0.226 m  AORTA Ao Root diam: 2.90 cm Ao Asc diam:  2.80 cm MITRAL VALVE               TRICUSPID VALVE MV Area (PHT): 4.68 cm    TR Peak grad:   12.2 mmHg MV Decel Time: 162 msec    TR Vmax:        175.00 cm/s MV E velocity: 85.90 cm/s MV A  velocity: 64.80 cm/s  SHUNTS MV E/A ratio:  1.33        Systemic VTI:  0.23 m                            Systemic Diam: 2.10 cm Phineas Inches Electronically signed by Phineas Inches Signature Date/Time: 07/28/2021/3:40:50 PM    Final     Cardiac Studies   Left heart catheterization July 28, 2021   Ost LM to Mid LM lesion is 90% stenosed.   The left ventricular systolic function is normal.   LV end diastolic pressure is normal.   The left ventricular ejection fraction is 55-65% by visual estimate.   There is no aortic valve stenosis.   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 Normal/preserved LVEF with normal EDP. Successful placement of 50 cc IABP pump via RFA 8 French sheath     RECOMMENDATIONS Patient be taken directly to the OR for emergent CABG with Dr. Roxan Hockey. Postoperatively will need high-dose high intensity statin, beta-blocker and aspirin. With ACS presentation, consider Plavix prior to discharge. 2D echocardiogram performed, pending results.    Echocardiogram done July 28, 2021 IMPRESSIONS     1. Left ventricular ejection fraction, by estimation, is 60 to 65%. The  left ventricle has normal function. The left ventricle has no regional  wall motion abnormalities. Left ventricular diastolic parameters are  indeterminate.   2. Right ventricular systolic function is normal. The right ventricular  size is normal. There is normal pulmonary artery systolic pressure.   3. Left atrial size was mildly dilated.   4. The mitral valve is normal in structure. No evidence of mitral valve  regurgitation.   5. The aortic valve is normal in structure. Aortic valve regurgitation is  not visualized. No aortic stenosis is present.   6. The inferior vena cava is normal in size with greater than 50%  respiratory  variability, suggesting right atrial pressure of 3 mmHg.   Comparison(s): No prior Echocardiogram.   Conclusion(s)/Recommendation(s): Normal biventricular function without  evidence of hemodynamically significant valvular heart disease.   FINDINGS   Left Ventricle: Left ventricular ejection fraction, by estimation, is 60  to 65%. The left ventricle has normal function. The left ventricle has no  regional wall motion abnormalities. The left ventricular internal cavity  size was normal in size. There is   no left ventricular hypertrophy. Left ventricular diastolic parameters  are indeterminate.   Right Ventricle: The right ventricular size is normal. No increase in  right ventricular wall thickness. Right ventricular systolic function is  normal. There is normal pulmonary artery systolic pressure. The tricuspid  regurgitant velocity is 1.75 m/s, and   with an assumed right atrial pressure of 3 mmHg, the estimated right  ventricular systolic pressure is  15.2 mmHg.   Left Atrium: Left atrial size was mildly dilated.   Right Atrium: Right atrial size was normal in size.   Pericardium: There is no evidence of pericardial effusion.   Mitral Valve: The mitral valve is normal in structure. No evidence of  mitral valve regurgitation.   Tricuspid Valve: The tricuspid valve is normal in structure. Tricuspid  valve regurgitation is not demonstrated.   Aortic Valve: The aortic valve is normal in structure. Aortic valve  regurgitation is not visualized. No aortic stenosis is present.   Pulmonic Valve: The pulmonic valve was not well visualized. Pulmonic valve  regurgitation is not visualized.   Aorta: The aortic root and ascending aorta are structurally normal, with  no evidence of dilitation.   Venous: The inferior vena cava is normal in size with greater than 50%  respiratory variability, suggesting right atrial pressure of 3 mmHg.   IAS/Shunts: No atrial level shunt detected by  color flow Doppler.     Patient Profile   70 y.o. female Hyperlipidemia presented with unstable angina underwent left heart catheterization proceeded to urgent CABG now postop day 1.  Assessment & Plan    Coronary artery disease status post CABG with LIMA to LAD, SVG to OM-postop day 1.  Still intubated however plans for extubation this morning.  Her balloon pump has been DC'd and plan to titrate patient off Neo-Synephrine per CT surgery.  Hyperlipidemia -  recent lipid profile was done this morning shows closer to 36, triglyceride 144, HDL 49, LDL 158.  She is currently on Lipitor 80 mg daily along with Zetia 10 mg daily.  Her LDL goal if CAD will be less than 70 if not less than 100.  Despite this the patient is still not at goal.  She may need to follow-up in the outpatient setting to discuss about opportunities for lipid-lowering agent such as PCSK9 inhibitors.  She may need LP(a) as well in the outpatient setting.  Her daughter is by the bedside during the visit and has no questions at this time.  DVT prophylaxis with SCD plus enoxaparin Total length of stay 2 days.  For questions or updates, please contact [place] Please consult [email or web address] for contact info under Cardiology/STEMI.      Signed, Berniece Salines, DO  07/29/2021, 10:23 AM

## 2021-07-29 NOTE — Progress Notes (Signed)
(  Late note): 11 cc of air was administered to the TR band without difficulty.

## 2021-07-29 NOTE — Addendum Note (Signed)
Addendum  created 07/29/21 0223 by Ollen Bowl, CRNA   Child order released for a procedure order, Clinical Note Signed, Intraprocedure Blocks edited, LDA created via procedure documentation, SmartForm saved

## 2021-07-29 NOTE — Addendum Note (Signed)
Addendum  created 07/29/21 0826 by Belinda Block, MD   Clinical Note Signed, Intraprocedure Blocks edited, LDA updated via procedure documentation, SmartForm saved

## 2021-07-29 NOTE — Progress Notes (Signed)
Rt. Groin IABP site started bleeding after extubation when patient coughed.  Pressure applied and cath lab called to assist.

## 2021-07-29 NOTE — Progress Notes (Signed)
1 Day Post-Op Procedure(s) (LRB): CORONARY ARTERY BYPASS GRAFTING (CABG) times two using left internal mammary artery and  left leg saphenous vein (N/A) Subjective: Intubated, alert and comfortable  Objective: Vital signs in last 24 hours: Temp:  [95.4 F (35.2 C)-99.5 F (37.5 C)] 98.9 F (37.2 C) (11/03 0744) Pulse Rate:  [0-88] 87 (11/03 0724) Cardiac Rhythm: Atrial paced (11/03 0000) Resp:  [0-66] 32 (11/03 0724) BP: (82-183)/(37-90) 97/51 (11/03 0724) SpO2:  [0 %-100 %] 100 % (11/03 0724) Arterial Line BP: (91-169)/(34-61) 96/51 (11/03 0700) FiO2 (%):  [40 %-80 %] 40 % (11/03 0724) Weight:  [63.8 kg] 63.8 kg (11/03 0500)  Hemodynamic parameters for last 24 hours: CVP:  [10 mmHg-53 mmHg] 20 mmHg  Intake/Output from previous day: 11/02 0701 - 11/03 0700 In: 4587.2 [I.V.:3307; NG/GT:60; IV Piggyback:1220.2] Out: 2830 [Urine:1860; Emesis/NG output:100; Blood:700; Chest Tube:170] Intake/Output this shift: No intake/output data recorded.  General appearance: alert, cooperative, and no distress Neurologic: no focal deficit Heart: regular rate and rhythm Lungs: faint rhonchi bilaterally Abdomen: normal findings: soft, non-tender  Lab Results: Recent Labs    07/28/21 2253 07/28/21 2254 07/29/21 0320 07/29/21 0324 07/29/21 0623  WBC 8.6  --  7.6  --   --   HGB 9.2*   < > 8.6* 7.8* 7.8*  HCT 27.2*   < > 26.2* 23.0* 23.0*  PLT 130*  --  152  --   --    < > = values in this interval not displayed.   BMET:  Recent Labs    07/28/21 0238 07/28/21 1906 07/28/21 2136 07/28/21 2254 07/29/21 0320 07/29/21 0324 07/29/21 0623  NA 137   < > 139   < > 140 141 142  K 3.5   < > 4.3   < > 4.9 4.8 4.0  CL 105   < > 104  --  110  --   --   CO2 24  --   --   --  24  --   --   GLUCOSE 97   < > 106*  --  103*  --   --   BUN 9   < > 5*  --  7*  --   --   CREATININE 0.75   < > 0.50  --  0.79  --   --   CALCIUM 8.6*  --   --   --  7.2*  --   --    < > = values in this interval  not displayed.    PT/INR:  Recent Labs    07/28/21 2253  LABPROT 20.3*  INR 1.7*   ABG    Component Value Date/Time   PHART 7.250 (L) 07/29/2021 0623   HCO3 22.7 07/29/2021 0623   TCO2 24 07/29/2021 0623   ACIDBASEDEF 4.0 (H) 07/29/2021 0623   O2SAT 97.0 07/29/2021 0623   CBG (last 3)  Recent Labs    07/29/21 0323 07/29/21 0635 07/29/21 0740  GLUCAP 105* 124* 108*    Assessment/Plan: S/P Procedure(s) (LRB): CORONARY ARTERY BYPASS GRAFTING (CABG) times two using left internal mammary artery and  left leg saphenous vein (N/A) POD # 1 emergency CABG NEURO- no focal deficit CV- in SR. Good cardiac index  Dc IABP  Wean neo as BP allows RESP- hypercarbic acute respiratory failure  Wean vent as tolerated RENAL= creatinine and lytes ok ENDO- CBG well controlled Gi- advance diet once extubated Anemia secondary to ABL- mild, follow Dc chest tubes and a line SCD +  enoxaparin Mobilize once extubated and IABP out   LOS: 2 days    Marissa Lee 07/29/2021

## 2021-07-29 NOTE — Procedures (Signed)
Extubation Procedure Note  Patient Details:   Name: Marissa Lee DOB: Oct 16, 1950 MRN: [id number]   Airway Documentation:    Vent end date: 07/29/21 Vent end time: 1032   Evaluation  O2 sats: stable throughout Complications: No apparent complications Patient did tolerate procedure well. Bilateral Breath Sounds: Diminished, Rhonchi   Pt extubated to 4L  per rapid wean protocol. NIF was -25 and VC was 750ml. Pt had positive cuff leak prior to extubation. No stridor noted. Pt able to voice her name.  [name] 07/29/2021, 10:33 AM

## 2021-07-29 NOTE — Anesthesia Procedure Notes (Signed)
Arterial Line Insertion Start/End11/11/2020 1:30 AM, 07/29/2021 1:35 AM Performed by: Kerrie Latour T, Immunologist, CRNA  Patient location: ICU. Preanesthetic checklist: patient identified, IV checked and monitors and equipment checked Patient sedated Left, radial was placed Catheter size: 20 G Hand hygiene performed  and maximum sterile barriers used   Attempts: 1 Procedure performed without using ultrasound guided technique. Following insertion, dressing applied and Biopatch. Post procedure assessment: normal  Patient tolerated the procedure well with no immediate complications. Additional procedure comments: Called to ICU to assist w ART line placement.

## 2021-07-30 ENCOUNTER — Inpatient Hospital Stay (HOSPITAL_COMMUNITY): Payer: Medicare Other

## 2021-07-30 DIAGNOSIS — E785 Hyperlipidemia, unspecified: Secondary | ICD-10-CM | POA: Diagnosis not present

## 2021-07-30 DIAGNOSIS — Z951 Presence of aortocoronary bypass graft: Secondary | ICD-10-CM | POA: Diagnosis not present

## 2021-07-30 DIAGNOSIS — I2510 Atherosclerotic heart disease of native coronary artery without angina pectoris: Secondary | ICD-10-CM | POA: Diagnosis not present

## 2021-07-30 LAB — CBC
HCT: 24.6 % — ABNORMAL LOW (ref 36.0–46.0)
Hemoglobin: 8.2 g/dL — ABNORMAL LOW (ref 12.0–15.0)
MCH: 32.4 pg (ref 26.0–34.0)
MCHC: 33.3 g/dL (ref 30.0–36.0)
MCV: 97.2 fL (ref 80.0–100.0)
Platelets: 145 10*3/uL — ABNORMAL LOW (ref 150–400)
RBC: 2.53 MIL/uL — ABNORMAL LOW (ref 3.87–5.11)
RDW: 13.3 % (ref 11.5–15.5)
WBC: 10 10*3/uL (ref 4.0–10.5)
nRBC: 0 % (ref 0.0–0.2)

## 2021-07-30 LAB — BASIC METABOLIC PANEL
Anion gap: 4 — ABNORMAL LOW (ref 5–15)
BUN: 7 mg/dL — ABNORMAL LOW (ref 8–23)
CO2: 24 mmol/L (ref 22–32)
Calcium: 8.1 mg/dL — ABNORMAL LOW (ref 8.9–10.3)
Chloride: 108 mmol/L (ref 98–111)
Creatinine, Ser: 0.79 mg/dL (ref 0.44–1.00)
GFR, Estimated: >60 mL/min (ref >60)
Glucose, Bld: 119 mg/dL — ABNORMAL HIGH (ref 70–99)
Potassium: 3.7 mmol/L (ref 3.5–5.1)
Sodium: 136 mmol/L (ref 135–145)

## 2021-07-30 LAB — GLUCOSE, CAPILLARY
Glucose-Capillary: 102 mg/dL — ABNORMAL HIGH (ref 70–99)
Glucose-Capillary: 111 mg/dL — ABNORMAL HIGH (ref 70–99)
Glucose-Capillary: 118 mg/dL — ABNORMAL HIGH (ref 70–99)
Glucose-Capillary: 127 mg/dL — ABNORMAL HIGH (ref 70–99)
Glucose-Capillary: 141 mg/dL — ABNORMAL HIGH (ref 70–99)
Glucose-Capillary: 85 mg/dL (ref 70–99)

## 2021-07-30 MED ORDER — ALBUTEROL SULFATE (2.5 MG/3ML) 0.083% IN NEBU: 2.5000 mg | INHALATION_SOLUTION | Freq: Four times a day (QID) | RESPIRATORY_TRACT | Status: DC | PRN

## 2021-07-30 MED ORDER — INSULIN ASPART 100 UNIT/ML IJ SOLN: 0.0000 [IU] | Freq: Three times a day (TID) | INTRAMUSCULAR | Status: DC

## 2021-07-30 MED ORDER — FENTANYL CITRATE PF 50 MCG/ML IJ SOSY
25.0000 ug | PREFILLED_SYRINGE | INTRAMUSCULAR | Status: DC | PRN
Start: 2021-07-30 — End: 2021-08-02
  Administered 2021-07-31 – 2021-08-01 (×2): 50 ug via INTRAVENOUS
  Filled 2021-07-30 (×2): qty 1

## 2021-07-30 MED ORDER — KETOROLAC TROMETHAMINE 15 MG/ML IJ SOLN
15.0000 mg | Freq: Four times a day (QID) | INTRAMUSCULAR | Status: AC | PRN
  Administered 2021-07-30 – 2021-07-31 (×4): 15 mg via INTRAVENOUS
  Filled 2021-07-30 (×4): qty 1

## 2021-07-30 MED ORDER — POTASSIUM CHLORIDE 10 MEQ/50ML IV SOLN
10.0000 meq | INTRAVENOUS | Status: AC
  Administered 2021-07-30 (×3): 10 meq via INTRAVENOUS
  Filled 2021-07-30 (×3): qty 50

## 2021-07-30 MED ORDER — GABAPENTIN 100 MG PO CAPS
100.0000 mg | ORAL_CAPSULE | Freq: Three times a day (TID) | ORAL | Status: DC
  Administered 2021-07-30 – 2021-07-31 (×2): 100 mg via ORAL
  Filled 2021-07-30 (×3): qty 1

## 2021-07-30 MED ORDER — DIPHENHYDRAMINE HCL 25 MG PO CAPS
50.0000 mg | ORAL_CAPSULE | Freq: Once | ORAL | Status: AC | PRN
  Administered 2021-07-30: 50 mg via ORAL
  Filled 2021-07-30: qty 2

## 2021-07-30 MED ORDER — ALBUTEROL SULFATE (2.5 MG/3ML) 0.083% IN NEBU
2.5000 mg | INHALATION_SOLUTION | Freq: Three times a day (TID) | RESPIRATORY_TRACT | Status: DC
Start: 2021-07-30 — End: 2021-07-30
  Administered 2021-07-30: 2.5 mg via RESPIRATORY_TRACT
  Filled 2021-07-30: qty 3

## 2021-07-30 MED ORDER — MELATONIN 5 MG PO TABS
5.0000 mg | ORAL_TABLET | Freq: Every evening | ORAL | Status: DC | PRN
  Administered 2021-07-30 – 2021-08-02 (×4): 5 mg via ORAL
  Filled 2021-07-30 (×4): qty 1

## 2021-07-30 MED ORDER — METOCLOPRAMIDE HCL 5 MG/ML IJ SOLN
10.0000 mg | Freq: Four times a day (QID) | INTRAMUSCULAR | Status: AC
  Administered 2021-07-30 – 2021-07-31 (×4): 10 mg via INTRAVENOUS
  Filled 2021-07-30 (×4): qty 2

## 2021-07-30 MED ORDER — DIPHENHYDRAMINE HCL 50 MG/ML IJ SOLN
INTRAMUSCULAR | Status: AC
  Filled 2021-07-30: qty 1

## 2021-07-30 NOTE — Plan of Care (Signed)
  Problem: Cardiac: Goal: Will achieve and/or maintain hemodynamic stability Outcome: Progressing   Problem: Respiratory: Goal: Respiratory status will improve Outcome: Progressing   Problem: Urinary Elimination: Goal: Ability to achieve and maintain adequate renal perfusion and functioning will improve Outcome: Progressing   Problem: Activity: Goal: Risk for activity intolerance will decrease Outcome: Progressing

## 2021-07-30 NOTE — Progress Notes (Signed)
Progress Note  Patient Name: Namiyah Grantham Date of Encounter: 07/30/2021  Primary Cardiologist: None   Subjective   Patient was seen and examined at her bedside.  She was sitting up in bed when I arrived.  She offers no complaints at this time.  She tells me she felt a bit confused yesterday but is getting back to her baseline.  Inpatient Medications    Scheduled Meds:  acetaminophen  1,000 mg Oral Q6H   Or   acetaminophen (TYLENOL) oral liquid 160 mg/5 mL  1,000 mg Per Tube Q6H   aspirin EC  325 mg Oral Daily   Or   aspirin  324 mg Per Tube Daily   atorvastatin  80 mg Oral Daily   bisacodyl  10 mg Oral Daily   Or   bisacodyl  10 mg Rectal Daily   chlorhexidine gluconate (MEDLINE KIT)  15 mL Mouth Rinse BID   Chlorhexidine Gluconate Cloth  6 each Topical Daily   docusate sodium  200 mg Oral Daily   enoxaparin (LOVENOX) injection  40 mg Subcutaneous QHS   ezetimibe  10 mg Oral Daily   FLUoxetine  40 mg Oral Daily   gabapentin  100 mg Oral TID   insulin aspart  0-9 Units Subcutaneous TID WC   mouth rinse  15 mL Mouth Rinse QID   metoCLOPramide (REGLAN) injection  10 mg Intravenous Q6H   metoprolol tartrate  12.5 mg Oral BID   Or   metoprolol tartrate  12.5 mg Per Tube BID   pantoprazole  40 mg Oral Daily   sodium chloride flush  3 mL Intravenous Q12H   Continuous Infusions:  sodium chloride 20 mL/hr at 07/30/21 0600   sodium chloride Stopped (07/29/21 0952)   sodium chloride     dexmedetomidine (PRECEDEX) IV infusion Stopped (07/29/21 0942)   lactated ringers     lactated ringers     lactated ringers 20 mL/hr at 07/30/21 0600   nitroGLYCERIN     phenylephrine (NEO-SYNEPHRINE) Adult infusion Stopped (07/29/21 2115)   potassium chloride 10 mEq (07/30/21 0732)   PRN Meds: sodium chloride, albuterol, fentaNYL (SUBLIMAZE) injection, ketorolac, lactated ringers, melatonin, metoprolol tartrate, ondansetron (ZOFRAN) IV, sodium chloride flush   Vital Signs    Vitals:    07/30/21 0500 07/30/21 0600 07/30/21 0757 07/30/21 0812  BP: (!) 126/57 (!) 116/54    Pulse: 75 80    Resp: 20 (!) 30    Temp:   98.1 F (36.7 C)   TempSrc:   Oral   SpO2: 100% 99%  100%  Weight: 63.8 kg     Height:        Intake/Output Summary (Last 24 hours) at 07/30/2021 0845 Last data filed at 07/30/2021 0600 Gross per 24 hour  Intake 1395.18 ml  Output 1305 ml  Net 90.18 ml   Filed Weights   07/27/21 1717 07/29/21 0500 07/30/21 0500  Weight: 63.5 kg 63.8 kg 63.8 kg    Telemetry    Sinus rhythm, heart rate 70 bpm- Personally Reviewed  ECG    None today- Personally Reviewed  Physical Exam   GEN: No acute distress.   Neck: No JVD Cardiac: RRR, no murmurs, rubs, or gallops. Chest: Dressing in place appears dry Respiratory: Clear to auscultation bilaterally. GI: Soft, nontender, non-distended  MS: No edema; No deformity. Neuro:  Nonfocal  Psych: Normal affect   Labs    Chemistry Recent Labs  Lab 07/29/21 0320 07/29/21 0324 07/29/21 1139 07/29/21 1715 07/30/21 0356  NA 140   < > 144 138 136  K 4.9   < > 4.1 4.2 3.7  CL 110  --   --  110 108  CO2 24  --   --  23 24  GLUCOSE 103*  --   --  149* 119*  BUN 7*  --   --  7* 7*  CREATININE 0.79  --   --  0.81 0.79  CALCIUM 7.2*  --   --  7.7* 8.1*  GFRNONAA >60  --   --  >60 >60  ANIONGAP 6  --   --  5 4*   < > = values in this interval not displayed.     Hematology Recent Labs  Lab 07/29/21 0320 07/29/21 0324 07/29/21 1139 07/29/21 1715 07/30/21 0356  WBC 7.6  --   --  10.2 10.0  RBC 2.67*  --   --  2.62* 2.53*  HGB 8.6*   < > 7.5* 8.5* 8.2*  HCT 26.2*   < > 22.0* 25.7* 24.6*  MCV 98.1  --   --  98.1 97.2  MCH 32.2  --   --  32.4 32.4  MCHC 32.8  --   --  33.1 33.3  RDW 12.8  --   --  13.2 13.3  PLT 152  --   --  147* 145*   < > = values in this interval not displayed.    Cardiac EnzymesNo results for input(s): TROPONINI in the last 168 hours. No results for input(s): TROPIPOC in the  last 168 hours.   BNPNo results for input(s): BNP, PROBNP in the last 168 hours.   DDimer No results for input(s): DDIMER in the last 168 hours.   Radiology    CARDIAC CATHETERIZATION  Result Date: 07/28/2021 Images from the original result were not included.   Ost LM to Mid LM lesion is 90% stenosed.   The left ventricular systolic function is normal.   LV end diastolic pressure is normal.   The left ventricular ejection fraction is 55-65% by visual estimate.   There is no aortic valve stenosis. 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 Normal/preserved LVEF with normal EDP. Successful placement of 50 cc IABP pump via RFA 8 French sheath RECOMMENDATIONS Patient be taken directly to the OR for emergent CABG with Dr. Roxan Hockey. Postoperatively will need high-dose high intensity statin, beta-blocker and aspirin. With ACS presentation, consider Plavix prior to discharge. 2D echocardiogram performed, pending results. Glenetta Hew, MD Glenetta Hew, M.D., M.S. Interventional Cardiologist Pager # [phone] Phone # [phone] [address] [place], [place] [address]   DG Chest Port 1 View  Result Date: 07/30/2021 CLINICAL DATA:  Status post CABG surgery.  Follow-up exam. EXAM: PORTABLE CHEST 1 VIEW COMPARISON:  07/29/2021 and older studies. FINDINGS: Since the previous day's exam, the endotracheal tube, nasal/orogastric tube, mediastinal tube and left chest tube have been removed. The right internal jugular central venous catheter is stable. Mild, left greater than right, lung base opacities persist consistent with atelectasis. Remainder of the lungs is clear. Cardiac silhouette is normal in size.  No mediastinal widening. No pneumothorax. IMPRESSION: 1. No acute findings or evidence of an operative complication. 2. All lines and  tubes removed with the exception of the right internal jugular central venous catheter, which is stable. 3. Mild persistent, left greater  than right, lung base atelectasis. Electronically Signed   By: Lajean Manes M.D.   On: 07/30/2021 08:36   DG Chest Port 1 View  Result Date: 07/29/2021 CLINICAL DATA:  Post op open heart surgery EXAM: PORTABLE CHEST 1 VIEW COMPARISON:  07/28/2021 FINDINGS: Endotracheal tube in good position. Central venous catheter tip in the SVC. Bilateral chest tubes remain in place. NG tube in the stomach. Progression of bibasilar atelectasis left greater than right. No significant effusion or edema. Negative for pneumothorax IMPRESSION: Support lines in good position.  No pneumothorax Progression of bibasilar atelectasis left greater than right. Electronically Signed   By: Franchot Gallo M.D.   On: 07/29/2021 08:47   DG Chest Port 1 View  Result Date: 07/28/2021 CLINICAL DATA:  Status post CABG x2. EXAM: PORTABLE CHEST 1 VIEW COMPARISON:  Chest x-ray 07/27/2021. FINDINGS: There are new sternotomy wires and postsurgical changes in the mediastinum. Endotracheal tube tip is 3 cm above the carina. Right-sided central venous catheter tip projects over the mid SVC. Enteric tube extends below the diaphragm. Mediastinal and left pleural drains are in place. Lungs and costophrenic angles are clear. No pneumothorax. Cardiomediastinal contour within normal limits. IMPRESSION: 1. Line/tubes and postsurgical changes as above. 2. The lungs are clear. Electronically Signed   By: Ronney Asters M.D.   On: 07/28/2021 23:23   ECHOCARDIOGRAM COMPLETE  Result Date: 07/28/2021    ECHOCARDIOGRAM REPORT   Patient Name:   KAMEISHA MALICKI Date of Exam: 07/28/2021 Medical Rec #:  [id number]    Height:       64.0 in Accession #:    [id number]   Weight:       140.0 lb Date of Birth:  02/20/1951    BSA:          1.681 m Patient Age:    61 years     BP:           134/61 mmHg Patient Gender: F            HR:            57 bpm. Exam Location:  Inpatient Procedure: 2D Echo, Cardiac Doppler and Color Doppler Indications:    R07.9* Chest pain, unspecified  History:        Patient has prior history of Echocardiogram examinations, most                 recent 01/29/2016.  Sonographer:    Bernadene Person RDCS Referring Phys: [facility]  1. Left ventricular ejection fraction, by estimation, is 60 to 65%. The left ventricle has normal function. The left ventricle has no regional wall motion abnormalities. Left ventricular diastolic parameters are indeterminate.  2. Right ventricular systolic function is normal. The right ventricular size is normal. There is normal pulmonary artery systolic pressure.  3. Left atrial size was mildly dilated.  4. The mitral valve is normal in structure. No evidence of mitral valve regurgitation.  5. The aortic valve is normal in structure. Aortic valve regurgitation is not visualized. No aortic stenosis is present.  6. The inferior vena cava is normal in size with greater than 50% respiratory variability, suggesting right atrial pressure of 3 mmHg. Comparison(s): No prior Echocardiogram. Conclusion(s)/Recommendation(s): Normal biventricular function without evidence of hemodynamically significant valvular heart disease. FINDINGS  Left Ventricle: Left ventricular ejection fraction, by estimation, is 60 to 65%. The left ventricle has normal function. The left ventricle has no regional wall motion abnormalities. The left ventricular internal  cavity size was normal in size. There is  no left ventricular hypertrophy. Left ventricular diastolic parameters are indeterminate. Right Ventricle: The right ventricular size is normal. No increase in right ventricular wall thickness. Right ventricular systolic function is normal. There is normal pulmonary artery systolic pressure. The tricuspid regurgitant velocity is 1.75 m/s, and  with an assumed right atrial pressure of 3 mmHg, the estimated right  ventricular systolic pressure is 21.1 mmHg. Left Atrium: Left atrial size was mildly dilated. Right Atrium: Right atrial size was normal in size. Pericardium: There is no evidence of pericardial effusion. Mitral Valve: The mitral valve is normal in structure. No evidence of mitral valve regurgitation. Tricuspid Valve: The tricuspid valve is normal in structure. Tricuspid valve regurgitation is not demonstrated. Aortic Valve: The aortic valve is normal in structure. Aortic valve regurgitation is not visualized. No aortic stenosis is present. Pulmonic Valve: The pulmonic valve was not well visualized. Pulmonic valve regurgitation is not visualized. Aorta: The aortic root and ascending aorta are structurally normal, with no evidence of dilitation. Venous: The inferior vena cava is normal in size with greater than 50% respiratory variability, suggesting right atrial pressure of 3 mmHg. IAS/Shunts: No atrial level shunt detected by color flow Doppler.  LEFT VENTRICLE PLAX 2D LVIDd:         4.00 cm   Diastology LVIDs:         2.20 cm   LV e' medial:    5.75 cm/s LV PW:         0.90 cm   LV E/e' medial:  14.9 LV IVS:        0.60 cm   LV e' lateral:   8.37 cm/s LVOT diam:     2.10 cm   LV E/e' lateral: 10.3 LV SV:         78 LV SV Index:   47 LVOT Area:     3.46 cm  RIGHT VENTRICLE RV S prime:     13.40 cm/s TAPSE (M-mode): 1.8 cm LEFT ATRIUM             Index        RIGHT ATRIUM           Index LA diam:        3.50 cm 2.08 cm/m   RA Area:     13.60 cm LA Vol (A2C):   37.9 ml 22.54 ml/m  RA Volume:   30.30 ml  18.02 ml/m LA Vol (A4C):   44.1 ml 26.23 ml/m LA Biplane Vol: 42.9 ml 25.52 ml/m  AORTIC VALVE LVOT Vmax:   101.00 cm/s LVOT Vmean:  66.400 cm/s LVOT VTI:    0.226 m  AORTA Ao Root diam: 2.90 cm Ao Asc diam:  2.80 cm MITRAL VALVE               TRICUSPID VALVE MV Area (PHT): 4.68 cm    TR Peak grad:   12.2 mmHg MV Decel Time: 162 msec    TR Vmax:        175.00 cm/s MV E velocity: 85.90 cm/s MV A velocity: 64.80  cm/s  SHUNTS MV E/A ratio:  1.33        Systemic VTI:  0.23 m                            Systemic Diam: 2.10 cm Phineas Inches Electronically signed by Phineas Inches Signature Date/Time: 07/28/2021/3:40:50 PM  Final     Cardiac Studies   Left heart catheterization July 28, 2021   Ost LM to Mid LM lesion is 90% stenosed.   The left ventricular systolic function is normal.   LV end diastolic pressure is normal.   The left ventricular ejection fraction is 55-65% by visual estimate.   There is no aortic valve stenosis.   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 Normal/preserved LVEF with normal EDP. Successful placement of 50 cc IABP pump via RFA 8 French sheath     RECOMMENDATIONS Patient be taken directly to the OR for emergent CABG with Dr. Roxan Hockey. Postoperatively will need high-dose high intensity statin, beta-blocker and aspirin. With ACS presentation, consider Plavix prior to discharge. 2D echocardiogram performed, pending results.     Echocardiogram done July 28, 2021 IMPRESSIONS     1. Left ventricular ejection fraction, by estimation, is 60 to 65%. The  left ventricle has normal function. The left ventricle has no regional  wall motion abnormalities. Left ventricular diastolic parameters are  indeterminate.   2. Right ventricular systolic function is normal. The right ventricular  size is normal. There is normal pulmonary artery systolic pressure.   3. Left atrial size was mildly dilated.   4. The mitral valve is normal in structure. No evidence of mitral valve  regurgitation.   5. The aortic valve is normal in structure. Aortic valve regurgitation is  not visualized. No aortic stenosis is present.   6. The inferior vena cava is normal in size with greater than 50%  respiratory variability, suggesting  right atrial pressure of 3 mmHg.   Comparison(s): No prior Echocardiogram.   Conclusion(s)/Recommendation(s): Normal biventricular function without  evidence of hemodynamically significant valvular heart disease.   FINDINGS   Left Ventricle: Left ventricular ejection fraction, by estimation, is 60  to 65%. The left ventricle has normal function. The left ventricle has no  regional wall motion abnormalities. The left ventricular internal cavity  size was normal in size. There is   no left ventricular hypertrophy. Left ventricular diastolic parameters  are indeterminate.   Right Ventricle: The right ventricular size is normal. No increase in  right ventricular wall thickness. Right ventricular systolic function is  normal. There is normal pulmonary artery systolic pressure. The tricuspid  regurgitant velocity is 1.75 m/s, and   with an assumed right atrial pressure of 3 mmHg, the estimated right  ventricular systolic pressure is 84.1 mmHg.   Left Atrium: Left atrial size was mildly dilated.   Right Atrium: Right atrial size was normal in size.   Pericardium: There is no evidence of pericardial effusion.   Mitral Valve: The mitral valve is normal in structure. No evidence of  mitral valve regurgitation.   Tricuspid Valve: The tricuspid valve is normal in structure. Tricuspid  valve regurgitation is not demonstrated.   Aortic Valve: The aortic valve is normal in structure. Aortic valve  regurgitation is not visualized. No aortic stenosis is present.   Pulmonic Valve: The pulmonic valve was not well visualized. Pulmonic valve  regurgitation is not visualized.   Aorta: The aortic root and ascending aorta are structurally normal, with  no evidence of dilitation.   Venous: The inferior vena cava is normal in size with greater than 50%  respiratory variability, suggesting right atrial pressure of 3 mmHg.   IAS/Shunts: No atrial level  shunt detected by color flow  Doppler.  Patient Profile     70 y.o. female with medical history coronary artery disease which was diagnosed during this admission after she presented with unstable angina, the patient proceeded to urgent CABG due to significant left main/LAD stenosis.  Assessment & Plan    Coronary artery disease status post CABG with LIMA to LAD, SVG to OM postop day 2.  We will continue patient on her aspirin and statin.  Hyperlipidemia continue Lipitor 80 mg and Zetia 10 mg for now.  Goal LDL less than 70.  We will need discussion in the outpatient setting for PCSK9 inhibitors if LDL does not improve    For questions or updates, please contact [facility] Please consult [email or web address] for contact info under Cardiology/STEMI.      Signed, Channie Bostick, DO  07/30/2021, 8:45 AM

## 2021-07-30 NOTE — Care Management (Signed)
07-30-21 Staff RN asked Case Manager to speak with the patient and her daughter. POD-2 CABG- prior to hospitalization patient states she was independent in the home. Patient and daughter are concerned that patient will need more assistance once she gets home. Patient is concerned about meals being prepared. Daughter states she has arranged a food train for her mom. Patient states she has a rollator in the home that she can use. Daughter is concerned that the patient will need someone in the home for seven days with her mother to make sure she is safe. Case Manager discussed if the patient had friends, neighbors, and other family that could assist with this. Case Manager also discussed personal care services (PCS) and that PCS is an out of pocket fee for the family. Patient states she has a long term care policy. Daughter declined brochures for personal care services. Staff RN to ambulate the patient and if RN feels that the patient needs further assistance; she will collaborate with MD to place PT/OT orders for recommendations. Transitions of care will continue to follow for additional needs.

## 2021-07-30 NOTE — Progress Notes (Signed)
2 Days Post-Op Procedure(s) (LRB): CORONARY ARTERY BYPASS GRAFTING (CABG) times two using left internal mammary artery and  left leg saphenous vein (N/A) Subjective: C/o nausea, adverse reaction to tramadol last night with itching  Objective: Vital signs in last 24 hours: Temp:  [98.1 F (36.7 C)-99.2 F (37.3 C)] 98.1 F (36.7 C) (11/04 0757) Pulse Rate:  [29-95] 80 (11/04 0600) Cardiac Rhythm: Atrial paced (11/03 1200) Resp:  [13-41] 30 (11/04 0600) BP: (87-134)/(26-88) 116/54 (11/04 0600) SpO2:  [89 %-100 %] 100 % (11/04 0812) Arterial Line BP: (89-167)/(41-95) 143/55 (11/04 0500) FiO2 (%):  [40 %] 40 % (11/03 0944) Weight:  [63.8 kg] 63.8 kg (11/04 0500)  Hemodynamic parameters for last 24 hours: CVP:  [4 mmHg-27 mmHg] 7 mmHg  Intake/Output from previous day: 11/03 0701 - 11/04 0700 In: 1466.7 [I.V.:1466.7] Out: 1440 [Urine:815; Chest Tube:625] Intake/Output this shift: No intake/output data recorded.  General appearance: alert, cooperative, and no distress Neurologic: intact Heart: regular rate and rhythm Lungs: diminished breath sounds bibasilar Abdomen: mildly distended, nontender  Lab Results: Recent Labs    07/29/21 1715 07/30/21 0356  WBC 10.2 10.0  HGB 8.5* 8.2*  HCT 25.7* 24.6*  PLT 147* 145*   BMET:  Recent Labs    07/29/21 1715 07/30/21 0356  NA 138 136  K 4.2 3.7  CL 110 108  CO2 23 24  GLUCOSE 149* 119*  BUN 7* 7*  CREATININE 0.81 0.79  CALCIUM 7.7* 8.1*    PT/INR:  Recent Labs    07/28/21 2253  LABPROT 20.3*  INR 1.7*   ABG    Component Value Date/Time   PHART 7.309 (L) 07/29/2021 1139   HCO3 24.5 07/29/2021 1139   TCO2 26 07/29/2021 1139   ACIDBASEDEF 2.0 07/29/2021 1139   O2SAT 98.0 07/29/2021 1139   CBG (last 3)  Recent Labs    07/30/21 0023 07/30/21 0414 07/30/21 0628  GLUCAP 141* 111* 127*    Assessment/Plan: S/P Procedure(s) (LRB): CORONARY ARTERY BYPASS GRAFTING (CABG) times two using left internal mammary  artery and  left leg saphenous vein (N/A) POD 2 NEURO- intact CV- in SR, BP OK  ASA, statin, beta blocker RESP_ IS, nebs RENAL- creatinine and lytes OK  Supplement K ENDO- CBG well controlled  Change CBG to AC and HS GI- gastric dilatation on CXR, hypoactive BS  Reglan x 24 hours Pain control- codeine allergy- will try Toradol for 24 hours Ambulate DC central line and Foley   LOS: 3 days    [name] 07/30/2021

## 2021-07-30 NOTE — Discharge Instructions (Signed)

## 2021-07-30 NOTE — Addendum Note (Signed)
Addendum  created 07/30/21 5956 by Belinda Block, MD   Intraprocedure Staff edited

## 2021-07-30 NOTE — Progress Notes (Signed)
      [address]       [place] [address]             [phone]      Up in chair.  Feels better this evening.  BP (!) 113/48   Pulse 75   Temp 98.2 F (36.8 C) (Oral)   Resp (!) 23   Ht 5\' 4"  (1.626 m)   Wt 63.8 kg   LMP 09/27/2003   SpO2 100%   BMI 24.14 kg/m   Intake/Output Summary (Last 24 hours) at 07/30/2021 1622 Last data filed at 07/30/2021 1552 Gross per 24 hour  Intake 1325.14 ml  Output 1860 ml  Net -534.86 ml   She was not on gabapentin preoperatively.  We will discontinue that medication.  We will continue Prozac.  Continue to mobilize as tolerated  Remo Lipps C. Roxan Hockey, MD [facility] [phone]

## 2021-07-31 ENCOUNTER — Inpatient Hospital Stay (HOSPITAL_COMMUNITY): Payer: Medicare Other

## 2021-07-31 LAB — BASIC METABOLIC PANEL
Anion gap: 3 — ABNORMAL LOW (ref 5–15)
BUN: 9 mg/dL (ref 8–23)
CO2: 25 mmol/L (ref 22–32)
Calcium: 8.2 mg/dL — ABNORMAL LOW (ref 8.9–10.3)
Chloride: 107 mmol/L (ref 98–111)
Creatinine, Ser: 0.76 mg/dL (ref 0.44–1.00)
GFR, Estimated: >60 mL/min (ref >60)
Glucose, Bld: 102 mg/dL — ABNORMAL HIGH (ref 70–99)
Potassium: 5 mmol/L (ref 3.5–5.1)
Sodium: 135 mmol/L (ref 135–145)

## 2021-07-31 LAB — CBC
HCT: 26.8 % — ABNORMAL LOW (ref 36.0–46.0)
Hemoglobin: 8.7 g/dL — ABNORMAL LOW (ref 12.0–15.0)
MCH: 32.5 pg (ref 26.0–34.0)
MCHC: 32.5 g/dL (ref 30.0–36.0)
MCV: 100 fL (ref 80.0–100.0)
Platelets: 157 10*3/uL (ref 150–400)
RBC: 2.68 MIL/uL — ABNORMAL LOW (ref 3.87–5.11)
RDW: 13.4 % (ref 11.5–15.5)
WBC: 9.9 10*3/uL (ref 4.0–10.5)
nRBC: 0 % (ref 0.0–0.2)

## 2021-07-31 LAB — GLUCOSE, CAPILLARY
Glucose-Capillary: 114 mg/dL — ABNORMAL HIGH (ref 70–99)
Glucose-Capillary: 109 mg/dL — ABNORMAL HIGH (ref 70–99)
Glucose-Capillary: 89 mg/dL (ref 70–99)
Glucose-Capillary: 95 mg/dL (ref 70–99)

## 2021-07-31 NOTE — Progress Notes (Signed)
3 Days Post-Op Procedure(s) (LRB): CORONARY ARTERY BYPASS GRAFTING (CABG) times two using left internal mammary artery and  left leg saphenous vein (N/A) Subjective: Feels a little better, nausea resolved  Objective: Vital signs in last 24 hours: Temp:  [97.6 F (36.4 C)-98.9 F (37.2 C)] 98.4 F (36.9 C) (11/05 0800) Pulse Rate:  [66-85] 85 (11/05 0800) Cardiac Rhythm: Normal sinus rhythm (11/05 0000) Resp:  [19-51] 23 (11/05 0800) BP: (93-126)/(43-79) 93/48 (11/05 0800) SpO2:  [85 %-100 %] 100 % (11/05 0800) Weight:  [63.2 kg] 63.2 kg (11/05 0600)  Hemodynamic parameters for last 24 hours: CVP:  [9 mmHg-12 mmHg] 12 mmHg  Intake/Output from previous day: 11/04 0701 - 11/05 0700 In: 684 [P.O.:600; I.V.:10.6; IV Piggyback:73.4] Out: 1740 [Urine:1740] Intake/Output this shift: No intake/output data recorded.  General appearance: alert, cooperative, and no distress Neurologic: intact Heart: regular rate and rhythm Lungs: diminished breath sounds bibasilar Abdomen: less distended, hypoactive BS  Lab Results: Recent Labs    07/30/21 0356 07/31/21 0313  WBC 10.0 9.9  HGB 8.2* 8.7*  HCT 24.6* 26.8*  PLT 145* 157   BMET:  Recent Labs    07/30/21 0356 07/31/21 0313  NA 136 135  K 3.7 5.0  CL 108 107  CO2 24 25  GLUCOSE 119* 102*  BUN 7* 9  CREATININE 0.79 0.76  CALCIUM 8.1* 8.2*    PT/INR:  Recent Labs    07/28/21 2253  LABPROT 20.3*  INR 1.7*   ABG    Component Value Date/Time   PHART 7.309 (L) 07/29/2021 1139   HCO3 24.5 07/29/2021 1139   TCO2 26 07/29/2021 1139   ACIDBASEDEF 2.0 07/29/2021 1139   O2SAT 98.0 07/29/2021 1139   CBG (last 3)  Recent Labs    07/30/21 1537 07/30/21 2215 07/31/21 0633  GLUCAP 118* 85 114*    Assessment/Plan: S/P Procedure(s) (LRB): CORONARY ARTERY BYPASS GRAFTING (CABG) times two using left internal mammary artery and  left leg saphenous vein (N/A) POD # 3 emergency CABG CV- in SR  BP remains relatively  low RESP_ O2 sats Ok   CXR shows bibasilar atelectasis RENAL- creatinine and lytes OK  Weight at preop ENDO- CBG well controlled Anemia secondary to ABL SCD + enoxaparin for DVT prophylaxis Cardiac rehab   LOS: 4 days    [name] 07/31/2021

## 2021-07-31 NOTE — Progress Notes (Signed)
      [address]       ,[place] [address]             [phone]       Feels better this evening BP (!) 102/49   Pulse 81   Temp 98.2 F (36.8 C) (Oral)   Resp (!) 21   Ht 5\' 4"  (1.626 m)   Wt 63.2 kg   LMP 09/27/2003   SpO2 91%   BMI 23.92 kg/m  On RA  Intake/Output Summary (Last 24 hours) at 07/31/2021 1811 Last data filed at 07/31/2021 0800 Gross per 24 hour  Intake 240 ml  Output 800 ml  Net -560 ml   Did well with ambulation today Awaiting bed on 4E  Anthonette Lesage C. Roxan Hockey, MD [facility] [phone]

## 2021-08-01 LAB — TYPE AND SCREEN
ABO/RH(D): A POS
Antibody Screen: NEGATIVE
Unit division: 0
Unit division: 0

## 2021-08-01 LAB — BPAM RBC
Blood Product Expiration Date: 202211212359
Blood Product Expiration Date: 202211212359
ISSUE DATE / TIME: 202211022054
ISSUE DATE / TIME: 202211022054
Unit Type and Rh: 6200
Unit Type and Rh: 6200

## 2021-08-01 MED ORDER — CLOPIDOGREL BISULFATE 75 MG PO TABS: 75.0000 mg | ORAL_TABLET | Freq: Every day | ORAL | Status: DC

## 2021-08-01 MED ORDER — KETOROLAC TROMETHAMINE 15 MG/ML IJ SOLN
15.0000 mg | Freq: Four times a day (QID) | INTRAMUSCULAR | Status: DC | PRN
  Administered 2021-08-01 (×2): 15 mg via INTRAVENOUS
  Filled 2021-08-01 (×2): qty 1

## 2021-08-01 MED ORDER — ATORVASTATIN CALCIUM 40 MG PO TABS
40.0000 mg | ORAL_TABLET | Freq: Every day | ORAL | Status: DC
  Administered 2021-08-01 – 2021-08-03 (×3): 40 mg via ORAL
  Filled 2021-08-01 (×3): qty 1

## 2021-08-01 MED ORDER — ASPIRIN EC 81 MG PO TBEC
81.0000 mg | DELAYED_RELEASE_TABLET | Freq: Every day | ORAL | Status: DC
  Administered 2021-08-02 – 2021-08-03 (×2): 81 mg via ORAL
  Filled 2021-08-01 (×2): qty 1

## 2021-08-01 MED ORDER — METOPROLOL SUCCINATE ER 25 MG PO TB24
25.0000 mg | ORAL_TABLET | Freq: Every day | ORAL | Status: DC
  Administered 2021-08-01 – 2021-08-03 (×3): 25 mg via ORAL
  Filled 2021-08-01 (×3): qty 1

## 2021-08-01 MED ORDER — CLOPIDOGREL BISULFATE 75 MG PO TABS
75.0000 mg | ORAL_TABLET | Freq: Every day | ORAL | Status: DC
  Administered 2021-08-02 – 2021-08-03 (×2): 75 mg via ORAL
  Filled 2021-08-01 (×2): qty 1

## 2021-08-01 NOTE — Progress Notes (Signed)
      [address]       [place] [address]             [phone]      POD  # 4 CABG  Look great this evening Ambulated 2 x around unit without assist  Awaiting 4E bed  Remo Lipps C. Roxan Hockey, MD [facility] [phone]

## 2021-08-01 NOTE — Progress Notes (Signed)
4 Days Post-Op Procedure(s) (LRB): CORONARY ARTERY BYPASS GRAFTING (CABG) times two using left internal mammary artery and  left leg saphenous vein (N/A) Subjective: Doesn't feel as well this AM  Objective: Vital signs in last 24 hours: Temp:  [98.2 F (36.8 C)-98.6 F (37 C)] 98.4 F (36.9 C) (11/06 0745) Pulse Rate:  [72-90] 90 (11/06 0600) Cardiac Rhythm: Normal sinus rhythm (11/06 0400) Resp:  [19-50] 36 (11/06 0600) BP: (90-123)/(44-71) 111/55 (11/06 0500) SpO2:  [89 %-100 %] 94 % (11/06 0600) Weight:  [63.4 kg] 63.4 kg (11/06 0500)  Hemodynamic parameters for last 24 hours:    Intake/Output from previous day: 11/05 0701 - 11/06 0700 In: 240 [P.O.:240] Out: 600 [Urine:600] Intake/Output this shift: No intake/output data recorded.  General appearance: alert, cooperative, and no distress Neurologic: intact Heart: regular rate and rhythm Lungs: diminished breath sounds bibasilar Abdomen: normal findings: soft, non-tender Wound: clean and dry  Lab Results: Recent Labs    07/30/21 0356 07/31/21 0313  WBC 10.0 9.9  HGB 8.2* 8.7*  HCT 24.6* 26.8*  PLT 145* 157   BMET:  Recent Labs    07/30/21 0356 07/31/21 0313  NA 136 135  K 3.7 5.0  CL 108 107  CO2 24 25  GLUCOSE 119* 102*  BUN 7* 9  CREATININE 0.79 0.76  CALCIUM 8.1* 8.2*    PT/INR: No results for input(s): LABPROT, INR in the last 72 hours. ABG    Component Value Date/Time   PHART 7.309 (L) 07/29/2021 1139   HCO3 24.5 07/29/2021 1139   TCO2 26 07/29/2021 1139   ACIDBASEDEF 2.0 07/29/2021 1139   O2SAT 98.0 07/29/2021 1139   CBG (last 3)  Recent Labs    07/31/21 1142 07/31/21 1538 07/31/21 2025  GLUCAP 109* 89 95    Assessment/Plan: S/P Procedure(s) (LRB): CORONARY ARTERY BYPASS GRAFTING (CABG) times two using left internal mammary artery and  left leg saphenous vein (N/A) POD # 4 Awaiting tele bed CV- in SR, BP OK  ASA, statin, plavix to start tomorrow  Dc pacing wires RESP_  continue IS RENAL- creatinine normal, lytes oK ENDO- CBG well controlled Anemia- stable Deconditioning- continue to mobilize   LOS: 5 days    [name] 08/01/2021

## 2021-08-02 ENCOUNTER — Inpatient Hospital Stay (HOSPITAL_COMMUNITY): Payer: Medicare Other

## 2021-08-02 DIAGNOSIS — I200 Unstable angina: Secondary | ICD-10-CM | POA: Diagnosis not present

## 2021-08-02 MED ORDER — ~~LOC~~ CARDIAC SURGERY, PATIENT & FAMILY EDUCATION: Freq: Once | Status: DC

## 2021-08-02 MED ORDER — MAGNESIUM HYDROXIDE 400 MG/5ML PO SUSP: 30.0000 mL | Freq: Every day | ORAL | Status: DC | PRN

## 2021-08-02 MED ORDER — FLUOXETINE HCL 20 MG PO CAPS
40.0000 mg | ORAL_CAPSULE | Freq: Every day | ORAL | Status: DC
  Administered 2021-08-03: 40 mg via ORAL
  Filled 2021-08-02: qty 2

## 2021-08-02 MED ORDER — ASPIRIN 81 MG PO TBEC: 81.0000 mg | DELAYED_RELEASE_TABLET | Freq: Every day | ORAL | 11 refills | Status: DC

## 2021-08-02 MED ORDER — ALUM & MAG HYDROXIDE-SIMETH 200-200-20 MG/5ML PO SUSP: 15.0000 mL | Freq: Four times a day (QID) | ORAL | Status: DC | PRN

## 2021-08-02 MED ORDER — SODIUM CHLORIDE 0.9% FLUSH: 3.0000 mL | INTRAVENOUS | Status: DC | PRN

## 2021-08-02 MED ORDER — SODIUM CHLORIDE 0.9 % IV SOLN: 250.0000 mL | INTRAVENOUS | Status: DC | PRN

## 2021-08-02 MED ORDER — SODIUM CHLORIDE 0.9% FLUSH
3.0000 mL | Freq: Two times a day (BID) | INTRAVENOUS | Status: DC
  Administered 2021-08-02 – 2021-08-03 (×2): 3 mL via INTRAVENOUS

## 2021-08-02 MED FILL — Potassium Chloride Inj 2 mEq/ML: INTRAVENOUS | Qty: 40 | Status: AC

## 2021-08-02 MED FILL — Lidocaine HCl Local Preservative Free (PF) Inj 2%: INTRAMUSCULAR | Qty: 15 | Status: AC

## 2021-08-02 MED FILL — Heparin Sodium (Porcine) Inj 1000 Unit/ML: Qty: 1000 | Status: AC

## 2021-08-02 NOTE — Progress Notes (Signed)
CARDIAC REHAB PHASE I   PRE:  Rate/Rhythm: 74 SR    BP: sitting 117/63    SaO2: 97 RA  MODE:  Ambulation: 370 ft   POST:  Rate/Rhythm: 88 SR    BP: sitting 162/68     SaO2: 99 RA  Pt out of bed with min assist and verbal cues. Walked to [employer] then hall with her rollator. Slow and steady, some SOB. VSS. To recliner. Practiced IS, 400 ml. Encouraged more use today. She is setting up to have a friend stay with her at home.  [place], ACSM 08/02/2021 9:22 AM

## 2021-08-02 NOTE — Plan of Care (Signed)
  Problem: Education: Goal: Knowledge of General Education information will improve Description: Including pain rating scale, medication(s)/side effects and non-pharmacologic comfort measures Outcome: Progressing   Problem: Health Behavior/Discharge Planning: Goal: Ability to manage health-related needs will improve Outcome: Progressing   Problem: Clinical Measurements: Goal: Ability to maintain clinical measurements within normal limits will improve Outcome: Progressing Goal: Will remain free from infection Outcome: Progressing Goal: Diagnostic test results will improve Outcome: Progressing Goal: Respiratory complications will improve Outcome: Progressing Goal: Cardiovascular complication will be avoided Outcome: Progressing   Problem: Nutrition: Goal: Adequate nutrition will be maintained Outcome: Progressing   Problem: Coping: Goal: Level of anxiety will decrease Outcome: Progressing   Problem: Elimination: Goal: Will not experience complications related to bowel motility Outcome: Progressing Goal: Will not experience complications related to urinary retention Outcome: Progressing   Problem: Pain Managment: Goal: General experience of comfort will improve Outcome: Progressing   Problem: Safety: Goal: Ability to remain free from injury will improve Outcome: Progressing   Problem: Skin Integrity: Goal: Risk for impaired skin integrity will decrease Outcome: Progressing   Problem: Education: Goal: Will demonstrate proper wound care and an understanding of methods to prevent future damage Outcome: Progressing Goal: Knowledge of disease or condition will improve Outcome: Progressing Goal: Knowledge of the prescribed therapeutic regimen will improve Outcome: Progressing Goal: Individualized Educational Video(s) Outcome: Progressing   Problem: Activity: Goal: Risk for activity intolerance will decrease Outcome: Progressing   Problem: Cardiac: Goal: Will  achieve and/or maintain hemodynamic stability Outcome: Progressing   Problem: Clinical Measurements: Goal: Postoperative complications will be avoided or minimized Outcome: Progressing   Problem: Skin Integrity: Goal: Wound healing without signs and symptoms of infection Outcome: Progressing Goal: Risk for impaired skin integrity will decrease Outcome: Progressing   Problem: Urinary Elimination: Goal: Ability to achieve and maintain adequate renal perfusion and functioning will improve Outcome: Progressing

## 2021-08-02 NOTE — Progress Notes (Signed)
Pt tranferred to 4E17 via wheelchair, no issues during transport, tele box attached, all concerns addressed with 4E nurse, ceasing care of pt at this time.

## 2021-08-02 NOTE — Progress Notes (Signed)
Progress Note  Patient Name: Marissa Lee Date of Encounter: 08/02/2021  Primary Cardiologist:   None   Subjective   Ambulated in the hallway.  Mild incisional pain.  Breathing OK.    Inpatient Medications    Scheduled Meds:  acetaminophen  1,000 mg Oral Q6H   Or   acetaminophen (TYLENOL) oral liquid 160 mg/5 mL  1,000 mg Per Tube Q6H   aspirin EC  81 mg Oral Daily   atorvastatin  40 mg Oral Daily   bisacodyl  10 mg Oral Daily   Or   bisacodyl  10 mg Rectal Daily   chlorhexidine gluconate (MEDLINE KIT)  15 mL Mouth Rinse BID   Chlorhexidine Gluconate Cloth  6 each Topical Daily   clopidogrel  75 mg Oral Daily   docusate sodium  200 mg Oral Daily   enoxaparin (LOVENOX) injection  40 mg Subcutaneous QHS   ezetimibe  10 mg Oral Daily   FLUoxetine  40 mg Oral Daily   mouth rinse  15 mL Mouth Rinse QID   metoprolol succinate  25 mg Oral Daily   pantoprazole  40 mg Oral Daily   sodium chloride flush  3 mL Intravenous Q12H   Continuous Infusions:  sodium chloride Stopped (07/30/21 0849)   sodium chloride Stopped (07/29/21 0952)   sodium chloride     lactated ringers     lactated ringers     lactated ringers Stopped (07/30/21 0609)   phenylephrine (NEO-SYNEPHRINE) Adult infusion Stopped (07/29/21 2115)   PRN Meds: sodium chloride, albuterol, fentaNYL (SUBLIMAZE) injection, ketorolac, lactated ringers, melatonin, metoprolol tartrate, ondansetron (ZOFRAN) IV, sodium chloride flush   Vital Signs    Vitals:   08/02/21 0600 08/02/21 0700 08/02/21 0740 08/02/21 0800  BP: 114/60 130/79  (!) 93/41  Pulse: 66 76  73  Resp: 15 (!) 28  19  Temp:   98.1 F (36.7 C)   TempSrc:   Oral   SpO2: 98% 97%  99%  Weight:      Height:        Intake/Output Summary (Last 24 hours) at 08/02/2021 0857 Last data filed at 08/02/2021 0800 Gross per 24 hour  Intake 480 ml  Output 500 ml  Net -20 ml   Filed Weights   07/30/21 0500 07/31/21 0600 08/01/21 0500  Weight: 63.8 kg 63.2 kg  63.4 kg    Telemetry    NSR - Personally Reviewed  ECG    NA - Personally Reviewed  Physical Exam   GEN: No acute distress.   Neck: No  JVD Cardiac: RRR, no murmurs, rubs, or gallops.  Respiratory:     Decreased breath sounds at the bases.  GI: Soft, nontender, non-distended  MS: No edema; No deformity. Neuro:  Nonfocal  Psych: Normal affect   Labs    Chemistry Recent Labs  Lab 07/29/21 1715 07/30/21 0356 07/31/21 0313  NA 138 136 135  K 4.2 3.7 5.0  CL 110 108 107  CO2 '23 24 25  ' GLUCOSE 149* 119* 102*  BUN 7* 7* 9  CREATININE 0.81 0.79 0.76  CALCIUM 7.7* 8.1* 8.2*  GFRNONAA >60 >60 >60  ANIONGAP 5 4* 3*     Hematology Recent Labs  Lab 07/29/21 1715 07/30/21 0356 07/31/21 0313  WBC 10.2 10.0 9.9  RBC 2.62* 2.53* 2.68*  HGB 8.5* 8.2* 8.7*  HCT 25.7* 24.6* 26.8*  MCV 98.1 97.2 100.0  MCH 32.4 32.4 32.5  MCHC 33.1 33.3 32.5  RDW 13.2 13.3 13.4  PLT 147* 145* 157    Cardiac EnzymesNo results for input(s): TROPONINI in the last 168 hours. No results for input(s): TROPIPOC in the last 168 hours.   BNPNo results for input(s): BNP, PROBNP in the last 168 hours.   DDimer No results for input(s): DDIMER in the last 168 hours.   Radiology    No results found.  Cardiac Studies   CARDIAC CATH  Diagnostic Dominance: Right   Patient Profile     70 y.o. female with unstable angina and anatomy as above.  Now status post 2 vessel CABG.   Assessment & Plan    Coronary artery disease status post CABG with LIMA to LAD, SVG to OM.  We will continue patient on her aspirin and statin.   Hyperlipidemia continue Lipitor 80 mg and Zetia 10 mg for now.  Goal LDL less than 70.  We will need discussion in the outpatient setting for PCSK9 inhibitors if LDL does not improve    CARDIOLOGY RECOMMENDATIONS:  Discharge is anticipated in the next 48 hours. Recommendations for medications and follow up:  Discharge Medications: Continue medications as they are  currently listed in the [facility] Exceptions to the above: No change in therapy.   Follow Up: The patient's Primary Cardiologist is None  I clarified with her.  Prior to admission she apparently had a new patient appt with Dr. Einar Gip.  Although he has yet to meet her she requests to establish with his practice post discharge.   Appt is scheduled.   Signed,  Minus Breeding, MD  8:57 AM 08/02/2021  [facility]   For questions or updates, please contact [facility] Please consult [email or web address] for contact info under Cardiology/STEMI.   Signed, Minus Breeding, MD  08/02/2021, 8:57 AM

## 2021-08-02 NOTE — Progress Notes (Signed)
5 Days Post-Op Procedure(s) (LRB): CORONARY ARTERY BYPASS GRAFTING (CABG) times two using left internal mammary artery and  left leg saphenous vein (N/A) Subjective: Feels "pretty good" this AM  Objective: Vital signs in last 24 hours: Temp:  [98.1 F (36.7 C)-98.4 F (36.9 C)] 98.1 F (36.7 C) (11/06 1900) Pulse Rate:  [63-93] 63 (11/07 0500) Cardiac Rhythm: Normal sinus rhythm (11/06 0800) Resp:  [15-37] 15 (11/07 0500) BP: (96-134)/(45-68) 106/68 (11/07 0500) SpO2:  [94 %-100 %] 96 % (11/07 0500)  Hemodynamic parameters for last 24 hours:    Intake/Output from previous day: 11/06 0701 - 11/07 0700 In: 290 [P.O.:290] Out: -  Intake/Output this shift: No intake/output data recorded.  General appearance: alert, cooperative, and no distress Neurologic: intact Heart: regular rate and rhythm Lungs: diminished breath sounds bibasilar Wound: clean and dry  Lab Results: Recent Labs    07/31/21 0313  WBC 9.9  HGB 8.7*  HCT 26.8*  PLT 157   BMET:  Recent Labs    07/31/21 0313  NA 135  K 5.0  CL 107  CO2 25  GLUCOSE 102*  BUN 9  CREATININE 0.76  CALCIUM 8.2*    PT/INR: No results for input(s): LABPROT, INR in the last 72 hours. ABG    Component Value Date/Time   PHART 7.309 (L) 07/29/2021 1139   HCO3 24.5 07/29/2021 1139   TCO2 26 07/29/2021 1139   ACIDBASEDEF 2.0 07/29/2021 1139   O2SAT 98.0 07/29/2021 1139   CBG (last 3)  Recent Labs    07/31/21 1142 07/31/21 1538 07/31/21 2025  GLUCAP 109* 89 95    Assessment/Plan: S/P Procedure(s) (LRB): CORONARY ARTERY BYPASS GRAFTING (CABG) times two using left internal mammary artery and  left leg saphenous vein (N/A) - Doing well In SR Continue ambulation ASA + plavix Probably home tomorrow   LOS: 6 days    [name] 08/02/2021

## 2021-08-03 ENCOUNTER — Other Ambulatory Visit: Payer: Self-pay | Admitting: Physician Assistant

## 2021-08-03 LAB — CBC
HCT: 24.5 % — ABNORMAL LOW (ref 36.0–46.0)
Hemoglobin: 7.9 g/dL — ABNORMAL LOW (ref 12.0–15.0)
MCH: 31.5 pg (ref 26.0–34.0)
MCHC: 32.2 g/dL (ref 30.0–36.0)
MCV: 97.6 fL (ref 80.0–100.0)
Platelets: 327 10*3/uL (ref 150–400)
RBC: 2.51 MIL/uL — ABNORMAL LOW (ref 3.87–5.11)
RDW: 13.2 % (ref 11.5–15.5)
WBC: 7.3 10*3/uL (ref 4.0–10.5)
nRBC: 1.5 % — ABNORMAL HIGH (ref 0.0–0.2)

## 2021-08-03 LAB — BASIC METABOLIC PANEL
Anion gap: 8 (ref 5–15)
BUN: 9 mg/dL (ref 8–23)
CO2: 25 mmol/L (ref 22–32)
Calcium: 8.5 mg/dL — ABNORMAL LOW (ref 8.9–10.3)
Chloride: 107 mmol/L (ref 98–111)
Creatinine, Ser: 0.73 mg/dL (ref 0.44–1.00)
GFR, Estimated: >60 mL/min (ref >60)
Glucose, Bld: 107 mg/dL — ABNORMAL HIGH (ref 70–99)
Potassium: 4 mmol/L (ref 3.5–5.1)
Sodium: 140 mmol/L (ref 135–145)

## 2021-08-03 MED ORDER — FERROUS SULFATE 325 (65 FE) MG PO TABS
325.0000 mg | ORAL_TABLET | Freq: Every day | ORAL | Status: DC
  Administered 2021-08-03: 325 mg via ORAL
  Filled 2021-08-03: qty 1

## 2021-08-03 MED ORDER — POTASSIUM CHLORIDE CRYS ER 20 MEQ PO TBCR: 20.0000 meq | EXTENDED_RELEASE_TABLET | Freq: Every day | ORAL | 0 refills | Status: DC

## 2021-08-03 MED ORDER — ATORVASTATIN CALCIUM 40 MG PO TABS: 40.0000 mg | ORAL_TABLET | Freq: Every day | ORAL | 1 refills | Status: DC

## 2021-08-03 MED ORDER — GUAIFENESIN ER 600 MG PO TB12
600.0000 mg | ORAL_TABLET | Freq: Two times a day (BID) | ORAL | Status: DC
  Administered 2021-08-03: 600 mg via ORAL
  Filled 2021-08-03: qty 1

## 2021-08-03 MED ORDER — INFLUENZA VAC A&B SA ADJ QUAD 0.5 ML IM PRSY
0.5000 mL | PREFILLED_SYRINGE | INTRAMUSCULAR | Status: AC
  Administered 2021-08-03: 0.5 mL via INTRAMUSCULAR
  Filled 2021-08-03: qty 0.5

## 2021-08-03 MED ORDER — LISINOPRIL 5 MG PO TABS
5.0000 mg | ORAL_TABLET | Freq: Every day | ORAL | Status: DC
  Administered 2021-08-03: 5 mg via ORAL
  Filled 2021-08-03: qty 1

## 2021-08-03 MED ORDER — FERROUS SULFATE 325 (65 FE) MG PO TABS: 325.0000 mg | ORAL_TABLET | Freq: Every day | ORAL | 3 refills | Status: DC

## 2021-08-03 MED ORDER — FUROSEMIDE 10 MG/ML IJ SOLN
40.0000 mg | Freq: Once | INTRAMUSCULAR | Status: AC
  Administered 2021-08-03: 40 mg via INTRAVENOUS
  Filled 2021-08-03: qty 4

## 2021-08-03 MED ORDER — FUROSEMIDE 40 MG PO TABS: 40.0000 mg | ORAL_TABLET | Freq: Every day | ORAL | 0 refills | Status: DC

## 2021-08-03 MED ORDER — GUAIFENESIN ER 600 MG PO TB12: 600.0000 mg | ORAL_TABLET | Freq: Two times a day (BID) | ORAL | Status: DC | PRN

## 2021-08-03 MED ORDER — POTASSIUM CHLORIDE CRYS ER 20 MEQ PO TBCR
20.0000 meq | EXTENDED_RELEASE_TABLET | Freq: Every day | ORAL | Status: DC
  Administered 2021-08-03: 20 meq via ORAL
  Filled 2021-08-03: qty 1

## 2021-08-03 MED ORDER — PHENOL 1.4 % MT LIQD
1.0000 | OROMUCOSAL | Status: DC | PRN
  Administered 2021-08-03: 1 via OROMUCOSAL
  Filled 2021-08-03: qty 177

## 2021-08-03 MED ORDER — LISINOPRIL 5 MG PO TABS: 5.0000 mg | ORAL_TABLET | Freq: Every day | ORAL | 1 refills | Status: DC

## 2021-08-03 MED ORDER — CLOPIDOGREL BISULFATE 75 MG PO TABS: 75.0000 mg | ORAL_TABLET | Freq: Every day | ORAL | 1 refills | Status: DC

## 2021-08-03 MED ORDER — METOPROLOL SUCCINATE ER 25 MG PO TB24: 25.0000 mg | ORAL_TABLET | Freq: Every day | ORAL | 1 refills | Status: DC

## 2021-08-03 MED ORDER — TRAMADOL HCL 50 MG PO TABS: 50.0000 mg | ORAL_TABLET | Freq: Four times a day (QID) | ORAL | 0 refills | Status: DC | PRN

## 2021-08-03 MED FILL — Electrolyte-R (PH 7.4) Solution: INTRAVENOUS | Qty: 3000 | Status: AC

## 2021-08-03 MED FILL — Sodium Chloride IV Soln 0.9%: INTRAVENOUS | Qty: 2000 | Status: AC

## 2021-08-03 MED FILL — Albumin, Human Inj 5%: INTRAVENOUS | Qty: 250 | Status: AC

## 2021-08-03 MED FILL — Lidocaine HCl (Cardiac) IV PF Soln 100 MG/5ML (2%): INTRAVENOUS | Qty: 5 | Status: AC

## 2021-08-03 MED FILL — Heparin Sodium (Porcine) Inj 1000 Unit/ML: INTRAMUSCULAR | Qty: 10 | Status: AC

## 2021-08-03 MED FILL — Sodium Bicarbonate IV Soln 8.4%: INTRAVENOUS | Qty: 50 | Status: AC

## 2021-08-03 MED FILL — Mannitol IV Soln 20%: INTRAVENOUS | Qty: 500 | Status: AC

## 2021-08-03 NOTE — Evaluation (Signed)
Physical Therapy Evaluation Patient Details Name: Marissa Lee MRN: [id number] DOB: 1951/01/16 Today's Date: 08/03/2021  History of Present Illness  Patient is a 70 y/o female admitted with chest pain and found to have 2 vessel disease on cath on 11/4 and underwent CABG x 2 and TEE on 11/4.  She has PMH of anxiety, endometriosis, fibrocystic breast disease, fibroids, HH, osteopenia, hypercholesterolemia, hypothyroid and Covid infection a month ago with mild cough and no prescriptions needed.  Clinical Impression  Patient presents with mobility limited as a result of her sternal precautions and fearful of revisiting back pain that has recently debilitated her and put her on a walker.  She has been up walking since surgery, but anxious about home and given limitations and help at home limited to 1 week feel she will benefit from follow up HHPT to ensure safe mobility, following through with precautions and not overdoing it to flare up her previous back issues.  Patient stable for home with assist.  PT signing off as planned d/c home today.        Recommendations for follow up therapy are one component of a multi-disciplinary discharge planning process, led by the attending physician.  Recommendations may be updated based on patient status, additional functional criteria and insurance authorization.  Follow Up Recommendations Home health PT    Assistance Recommended at Discharge Intermittent Supervision/Assistance  Functional Status Assessment Patient has had a recent decline in their functional status and demonstrates the ability to make significant improvements in function in a reasonable and predictable amount of time.  Equipment Recommendations  BSC/3in1    Recommendations for Other Services       Precautions / Restrictions Precautions Precautions: Sternal;Fall      Mobility  Bed Mobility Overal bed mobility: Needs Assistance Bed Mobility: Rolling;Sidelying to Sit;Sit to  Sidelying Rolling: Supervision Sidelying to sit: Min guard;HOB elevated     Sit to sidelying: Min guard;HOB elevated General bed mobility comments: cues for technique, assist with cues for bracing with legs and hugging pillow to come upright and for positioning to side/supine    Transfers Overall transfer level: Needs assistance Equipment used: Rollator (4 wheels) Transfers: Sit to/from Stand Sit to Stand: Supervision           General transfer comment: for safe technique hugging pillow    Ambulation/Gait Ambulation/Gait assistance: Supervision Gait Distance (Feet): 120 Feet Assistive device: Rollator (4 wheels) Gait Pattern/deviations: Step-through pattern;Decreased stride length       General Gait Details: decent pace, keeps walker close, S for safety/balance/direction  Stairs Stairs: Yes Stairs assistance: Supervision Stair Management: Step to pattern;Sideways;One rail Left Number of Stairs: 3 General stair comments: demonstrated technique as pt relates stair set up for entry with rails wide and steps that circle out; discussed friend taking walker up to the door and pt using rail with friend on down side of her for safety  Wheelchair Mobility    Modified Rankin (Stroke Patients Only)       Balance Overall balance assessment: Needs assistance   Sitting balance-Leahy Scale: Good       Standing balance-Leahy Scale: Fair Standing balance comment: static able to stand no AD, using walker for ambulation                             Pertinent Vitals/Pain Pain Assessment: Faces Faces Pain Scale: Hurts little more Pain Location: L medial knee Pain Descriptors / Indicators: Discomfort;Sharp Pain Intervention(s): Monitored  during session;Repositioned    Home Living Family/patient expects to be discharged to:: Private residence Living Arrangements: Alone Available Help at Discharge: Friend(s);Available 24 hours/day Type of Home: House Home  Access: Stairs to enter Entrance Stairs-Rails: Psychiatric nurse of Steps: 4   Home Layout: One level Home Equipment: Rollator (4 wheels) Additional Comments: friend from [place] here to stay with her for a week since daughter who lives local has 4 kids age 54-9.    Prior Function Prior Level of Function : Independent/Modified Independent             Mobility Comments: used rollator due to recent back pain flare with needing steriods, etc       Hand Dominance        Extremity/Trunk Assessment   Upper Extremity Assessment Upper Extremity Assessment: Generalized weakness (NT due to sternal precautions)    Lower Extremity Assessment Lower Extremity Assessment: LLE deficits/detail LLE Deficits / Details: mild edema and eccymosis from vein harvest, AROM grossly WFL for mobility, though painful on medial aspect just distal to knee and some hypersensitivity medial aspect proximal to ankle       Communication   Communication: No difficulties  Cognition Arousal/Alertness: Awake/alert Behavior During Therapy: WFL for tasks assessed/performed Overall Cognitive Status: Within Functional Limits for tasks assessed                                          General Comments General comments (skin integrity, edema, etc.): Has a home care policy that kicks in after 90 days and she has already made claim, friend here for 1 week, daughter too busy with kids to help.    Exercises     Assessment/Plan    PT Assessment All further PT needs can be met in the next venue of care  PT Problem List Decreased knowledge of precautions;Decreased mobility;Decreased activity tolerance;Decreased safety awareness;Decreased strength       PT Treatment Interventions      PT Goals (Current goals can be found in the Care Plan section)  Acute Rehab PT Goals Patient Stated Goal: to get home PT PT Goal Formulation: All assessment and education complete, DC therapy     Frequency     Barriers to discharge        Co-evaluation               AM-PAC PT "6 Clicks" Mobility  Outcome Measure Help needed turning from your back to your side while in a flat bed without using bedrails?: None Help needed moving from lying on your back to sitting on the side of a flat bed without using bedrails?: A Little Help needed moving to and from a bed to a chair (including a wheelchair)?: A Little Help needed standing up from a chair using your arms (e.g., wheelchair or bedside chair)?: A Little Help needed to walk in hospital room?: A Little Help needed climbing 3-5 steps with a railing? : A Little 6 Click Score: 19    End of Session   Activity Tolerance: Patient tolerated treatment well Patient left: in bed;with call bell/phone within reach;with family/visitor present   PT Visit Diagnosis: Muscle weakness (generalized) (M62.81);Other abnormalities of gait and mobility (R26.89)    Time: 9147-8295 PT Time Calculation (min) (ACUTE ONLY): 22 min   Charges:   PT Evaluation $PT Eval Low Complexity: 1 Low  Magda Kiel, PT Acute Rehabilitation Services Pager:[phone] Office:[phone] 08/03/2021   Reginia Naas 08/03/2021, 12:55 PM

## 2021-08-03 NOTE — Progress Notes (Signed)
Discussed IS, sternal precautions, exercise, diet and CRPII with pt and family. Receptive. Pt practiced getting in and out of bed with success. Will refer to [facility].  [id number] [place], ACSM 2:03 PM 08/03/2021

## 2021-08-03 NOTE — Progress Notes (Signed)
Mobility Specialist: Progress Note   08/03/21 1205  Mobility  Activity Ambulated in hall  Level of Assistance Minimal assist, patient does 75% or more  Assistive Device Four wheel walker  Distance Ambulated (ft) 550 ft  Mobility Ambulated with assistance in hallway  Mobility Response Tolerated well  Mobility performed by Mobility specialist  $Mobility charge 1 Mobility   Pre-Mobility: 77 HR Post-Mobility: 75 HR  Pt to BR and then agreeable to ambulation. Pt c/o feeling a little SOB as well as some soreness in her L knee during ambulation, otherwise no c/o. Pt back to bed after walk with call bell and phone at her side.   [facility] Krithi Bray Mobility Specialist Mobility Specialist Phone: [phone]

## 2021-08-03 NOTE — Care Management Important Message (Signed)
Important Message  Patient Details  Name: Marissa Lee MRN: [id number] Date of Birth: April 11, 1951   Medicare Important Message Given:  Yes     Shelda Altes 08/03/2021, 1:24 PM

## 2021-08-03 NOTE — Progress Notes (Addendum)
      [address]       [place] [address]             [phone]        6 Days Post-Op Procedure(s) (LRB): CORONARY ARTERY BYPASS GRAFTING (CABG) times two using left internal mammary artery and  left leg saphenous vein (N/A)  Subjective: Patient having difficulty expectorating sputum. She states she walked 4 times yesterday  Objective: Vital signs in last 24 hours: Temp:  [97.7 F (36.5 C)-98.3 F (36.8 C)] 98.2 F (36.8 C) (11/08 0322) Pulse Rate:  [68-87] 68 (11/08 0322) Cardiac Rhythm: Normal sinus rhythm (11/07 2005) Resp:  [16-36] 18 (11/08 0322) BP: (93-163)/(41-98) 145/66 (11/08 0322) SpO2:  [94 %-100 %] 97 % (11/08 0322) Weight:  [66.5 kg] 66.5 kg (11/08 0627)  Pre op weight  63.5 kg Current Weight  08/03/21 66.5 kg      Intake/Output from previous day: 11/07 0701 - 11/08 0700 In: 600 [P.O.:600] Out: 501 [Urine:501]   Physical Exam:  Cardiovascular: RRR Pulmonary: Slightly diminished bibasilar breath sounds L>R Abdomen: Soft, non tender, bowel sounds present. Extremities: Mild bilateral lower extremity edema. Wounds: Dressing removed and sternal wound is clean and dry.  No erythema or signs of infection.  Lab Results: CBC: Recent Labs    08/03/21 0309  WBC 7.3  HGB 7.9*  HCT 24.5*  PLT 327   BMET:  Recent Labs    08/03/21 0309  NA 140  K 4.0  CL 107  CO2 25  GLUCOSE 107*  BUN 9  CREATININE 0.73  CALCIUM 8.5*    PT/INR:  Lab Results  Component Value Date   INR 1.7 (H) 07/28/2021   INR 1.0 07/28/2021   ABG:  INR: Will add last result for INR, ABG once components are confirmed Will add last 4 CBG results once components are confirmed  Assessment/Plan:  1. CV - SR with HR in the 60-70's. BP increasing-will start low dose ACE.On Toprol XL 25 mg daily and Plavix 75 mg daily 2.  Pulmonary - On room air. On lateral PA/LAT CXR yesterday, left pleural effusion-appears moderate and small right pleural effusion.  Mucinex 600 mg bid.Encourage incentive spirometer 3. Volume Overload - Give Lasix 40 mg daily 4.  Expected post op acute blood loss anemia - H and H decreased to 7.9 and 24.5. Start oral iron. 5. PT consult-per nurse, patient with back problems 6. As discussed with Dr. Roxan Hockey, no need for thoracentesis. Will continue daily Lasix. Per Dr. Roxan Hockey, ok to discharge later today. Will place discharge order, once PT determines if any needs at discharge  [place] 08/03/2021,7:27 AM

## 2021-08-03 NOTE — Progress Notes (Incomplete)
Discussed IS, sternal precautions, exercise, diet, and CRPII. Had pt practice getting in and out of bed following sterna l

## 2021-08-03 NOTE — TOC Transition Note (Signed)
Transition of Care (TOC) - CM/SW Discharge Note Marvetta Gibbons RN, BSN Transitions of Care Unit 4E- RN Case Manager See Treatment Team for direct phone #    Patient Details  Name: Marissa Lee MRN: [id number] Date of Birth: [birth year]  Transition of Care [facility]) CM/SW Contact:  Dawayne Patricia, RN Phone Number: 08/03/2021, 2:28 PM   Clinical Narrative:    Pt stable for transition home today, orders placed for [facility] and DME needs. Call made to patient's room to discuss transition needs.  Choice offered for [facility] needs Per CMS guidelines from medicare.gov website with star ratings- reviewed list with pt and daughter over phone- per daughter first choice would be [facility] with second choice as [place]. Pt would like 3n1 for home and are ok with using in house provider to supply and have delivered to room prior to discharge.   Call made to [facility] with [facility] for [address] referral- referral has been accepted.   Call made to [facility] for DME- 3n1 - to be delivered to room.   Final next level of care: [place] Barriers to Discharge: No Barriers Identified   Patient Goals and CMS Choice Patient states their goals for this hospitalization and ongoing recovery are:: return home CMS Medicare.gov Compare Post Acute Care list provided to:: Patient Choice offered to / list presented to : Patient, Adult Children  Discharge Placement               Home with [facility]        Discharge Plan and Services   Discharge Planning Services: CM Consult Post Acute Care Choice: Durable Medical Equipment, Home Health          DME Arranged: 3-N-1 DME Agency: [facility] Date DME Agency Contacted: 08/03/21 Time DME Agency Contacted: 31 Representative spoke with at DME Agency: [place]: PT [place]: [place] Date [place]: 08/03/21 Time [place]: [place] Representative spoke with at [place]: [place] ([place]) Interventions      Readmission Risk Interventions Readmission Risk Prevention Plan 08/03/2021  Post Dischage Appt Complete  Medication Screening Complete  Transportation Screening Complete  Some recent data might be hidden

## 2021-08-05 ENCOUNTER — Telehealth: Payer: Self-pay

## 2021-08-05 ENCOUNTER — Telehealth (HOSPITAL_COMMUNITY): Payer: Self-pay

## 2021-08-05 ENCOUNTER — Ambulatory Visit: Payer: Medicare Other | Admitting: Cardiology

## 2021-08-05 ENCOUNTER — Ambulatory Visit
Admission: RE | Admit: 2021-08-05 | Discharge: 2021-08-05 | Disposition: A | Discharge status: Home / Self Care | Payer: Medicare Other | Source: Ambulatory Visit | Attending: Thoracic Surgery (Cardiothoracic Vascular Surgery) | Admitting: Thoracic Surgery (Cardiothoracic Vascular Surgery)

## 2021-08-05 ENCOUNTER — Other Ambulatory Visit: Payer: Self-pay

## 2021-08-05 DIAGNOSIS — R0602 Shortness of breath: Secondary | ICD-10-CM

## 2021-08-05 DIAGNOSIS — Z48812 Encounter for surgical aftercare following surgery on the circulatory system: Secondary | ICD-10-CM

## 2021-08-05 DIAGNOSIS — Z951 Presence of aortocoronary bypass graft: Secondary | ICD-10-CM

## 2021-08-05 NOTE — Telephone Encounter (Signed)
Attempted to call pt in regards to cardiac rehab, pt friend Joy answered the phone and stated that pt wasn't available to speak with at the moment and that she would give her a message to call back.

## 2021-08-05 NOTE — Telephone Encounter (Signed)
Lubertha Basque, PT with [facility] contacted the office concerned about patient. States she is short of breath, non-productive cough, and high blood pressure (173/92). O2 sats were 92-95% on RA while seating and ambulating. 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 Advised patient to get an xray at [place] for follow-up on bilateral small-moderate pleural effusions from last xray at discharge.   Chest xray completed and [place] Cutter, PA to look at films. Pleural effusions shown improvement since discharge from the hospital. She is currently take Lasix 40 mg x10 days. Parks Neptune states that patient can be seen in the clinic if shortness of breath has not improved.  Contacted patient back, she is short of breath even with little to no activity. Made appointment to see a PA here in the office tomorrow at 2pm. Also advised that if her shortness of breath became any worse over the next 24 hrs she needed to call or go to the nearest emergency room for evaluation. She acknowledged receipt.

## 2021-08-05 NOTE — Telephone Encounter (Signed)
Pt returned phone call and stated that she is interested in the cardiac rehab program. Martin Majestic over insurance process and advised pt of the 1-3 month backlog. Pt understood.

## 2021-08-06 ENCOUNTER — Ambulatory Visit (INDEPENDENT_AMBULATORY_CARE_PROVIDER_SITE_OTHER): Payer: Self-pay | Admitting: Surgical

## 2021-08-06 ENCOUNTER — Other Ambulatory Visit: Payer: Self-pay

## 2021-08-06 VITALS — BP 95/58 | HR 82 | Resp 20 | Ht 64.0 in | Wt 136.0 lb

## 2021-08-06 DIAGNOSIS — Z951 Presence of aortocoronary bypass graft: Secondary | ICD-10-CM

## 2021-08-06 NOTE — Patient Instructions (Signed)
As discussed sleep hygiene recs

## 2021-08-06 NOTE — Progress Notes (Signed)
[address]       [place] [address]             [phone]      Cienna Marrs [facility] Medical Record #[id number] Date of Birth: 31-Jul-1951  Referring: Leonie Man, MD Primary Care: Binnie Rail, MD Primary Cardiologist: None   Chief Complaint:   POST OP FOLLOW UP   DATE OF PROCEDURE: 07/28/2021     PREOPERATIVE DIAGNOSIS:  Critical left main disease.   POSTOPERATIVE DIAGNOSIS:  Critical left main disease.   PROCEDURE PERFORMED:  Median sternotomy, extracorporeal circulation, coronary artery bypass grafting x 2 (left internal mammary artery to LAD, saphenous vein graft to obtuse marginal 1), endoscopic vein harvest right thigh.   SURGEON:  Revonda Standard. Roxan Hockey, MD   ASSISTANT:  Ellwood Handler, PA   ANESTHESIA:  General. History of Present Illness:    The patient is a 69 year old female status post the above described urgent procedure.  Yesterday she contacted the office due to some increasing shortness of breath.  A chest x-ray was obtained and the results are listed below.  She does have some small bilateral effusions with atelectasis.  Additionally of note she has a had a recent COVID infection.  She has multiple concerns about her overall recovery.  She has a mild cough with some mild sputum production.  Additionally she is having difficulty sleeping she attributes primarily to Lasix.  She does admit to very poor sleep hygiene habits.  She often stays up quite late and sleeps till the afternoon.  She has not had any fevers that have been documented.  She has mild constipation.  She admits that anxiety and depression are a significant component to her overall sense of wellbeing.      Past Medical History:  Diagnosis Date   Anxiety    Endometriosis    Fibrocystic breast    muliple drainage   Fibroid    Hiatal hernia    History of bronchitis    Hypercholesteremia 2007   currently not taking anything for it   Osteopenia 08/2016   hip  and spine   Subclinical hypothyroidism 10/04/2018   Urge incontinence 06/05/2013     Social History   Tobacco Use  Smoking Status Former   Packs/day: 1.00   Years: 20.00   Pack years: 20.00   Types: Cigarettes  Smokeless Tobacco Never  Tobacco Comments   quit in approximately 2000    Social History   Substance and Sexual Activity  Alcohol Use Yes   Alcohol/week: 3.0 - 4.0 standard drinks   Types: 2 Cans of beer, 1 - 2 Standard drinks or equivalent per week   Comment: rarely     Allergies  Allergen Reactions   Cephalosporins Anaphylaxis and Swelling    Throat swells   Clindamycin/Lincomycin     Metallic taste, blisters behind ears   Codeine Itching    Current Outpatient Medications  Medication Sig Dispense Refill   acetaminophen (TYLENOL) 500 MG tablet Take 1,000 mg by mouth every 6 (six) hours as needed for moderate pain or headache.     albuterol (VENTOLIN HFA) 108 (90 Base) MCG/ACT inhaler Inhale 2 puffs into the lungs every 6 (six) hours as needed for shortness of breath or wheezing.     aspirin EC 81 MG EC tablet Take 1 tablet (81 mg total) by mouth daily. Swallow whole. 30 tablet 11   atorvastatin (LIPITOR) 40 MG tablet Take 1 tablet (40  mg total) by mouth daily. 30 tablet 1   Calcium-Phosphorus-Vitamin D (CITRACAL +D3 PO) Take by mouth daily.     clopidogrel (PLAVIX) 75 MG tablet Take 1 tablet (75 mg total) by mouth daily. 30 tablet 1   Dextrose, Diabetic Use, (GLUCOSE PO) Take 1 tablet by mouth daily as needed. When feels like sugar is low     ezetimibe (ZETIA) 10 MG tablet Take 1 tablet (10 mg total) by mouth daily. 90 tablet 3   ferrous sulfate 325 (65 FE) MG tablet Take 1 tablet (325 mg total) by mouth daily with breakfast. For one month then stop.  3   FLUoxetine (PROZAC) 40 MG capsule TAKE ONE CAPSULE BY MOUTH EVERY DAY (Patient taking differently: Take 40 mg by mouth daily.) 90 capsule 3   furosemide (LASIX) 40 MG tablet Take 1 tablet (40 mg total) by mouth  daily. For 10 days then stop 10 tablet 0   guaiFENesin (MUCINEX) 600 MG 12 hr tablet Take 1 tablet (600 mg total) by mouth 2 (two) times daily as needed for to loosen phlegm or cough.     lisinopril (ZESTRIL) 5 MG tablet Take 1 tablet (5 mg total) by mouth daily. 30 tablet 1   melatonin 5 MG TABS Take 5 mg by mouth at bedtime as needed (sleep).     metoprolol succinate (TOPROL-XL) 25 MG 24 hr tablet Take 1 tablet (25 mg total) by mouth daily. 60 tablet 1   potassium chloride SA (KLOR-CON) 20 MEQ tablet Take 1 tablet (20 mEq total) by mouth daily. For ten days then stop 10 tablet 0   traMADol (ULTRAM) 50 MG tablet Take 1 tablet (50 mg total) by mouth every 6 (six) hours as needed. 28 tablet 0   No current facility-administered medications for this visit.       Physical Exam: Ht 5\' 4"  (1.626 m)   LMP 09/27/2003   BMI 25.18 kg/m   General appearance: alert, cooperative, fatigued, and no distress Heart: regular rate and rhythm Lungs: Minimally diminished in the bases bilaterally Abdomen: Benign exam Extremities: No edema Wound: Incisions healing well without evidence of infection   Diagnostic Studies & Laboratory data:     Recent Radiology Findings:   DG Chest 2 View  Result Date: 08/05/2021 CLINICAL DATA:  Shortness of breath EXAM: CHEST - 2 VIEW COMPARISON:  Radiograph 08/02/2021 FINDINGS: Unchanged cardiomediastinal silhouette. Prior median sternotomy and CABG. There are small bilateral pleural effusions and bibasilar opacities, likely atelectasis. Obliquely oriented bandlike opacity in the right lung, similar to prior exam, likely scarring or atelectasis. No visible pneumothorax. No acute osseous abnormality. IMPRESSION: Stable small bilateral pleural effusions and adjacent bibasilar atelectasis. Electronically Signed   By: Maurine Simmering M.D.   On: 08/05/2021 15:33      Recent Lab Findings: Lab Results  Component Value Date   WBC 7.3 08/03/2021   HGB 7.9 (L) 08/03/2021   HCT  24.5 (L) 08/03/2021   PLT 327 08/03/2021   GLUCOSE 107 (H) 08/03/2021   CHOL 236 (H) 07/28/2021   TRIG 144 07/28/2021   HDL 49 07/28/2021   LDLCALC 158 (H) 07/28/2021   ALT 16 10/30/2019   AST 19 10/30/2019   NA 140 08/03/2021   K 4.0 08/03/2021   CL 107 08/03/2021   CREATININE 0.73 08/03/2021   BUN 9 08/03/2021   CO2 25 08/03/2021   TSH 3.643 07/27/2021   INR 1.7 (H) 07/28/2021   HGBA1C 5.2 07/28/2021      Assessment /  Plan: The patient is overall doing reasonably well in her postoperative recovery.  Anxiety does appear to be playing a significant role.  She has been taking melatonin at a higher than prescribed dose to assist her with sleep.  We discussed sleep hygiene including such things as limiting screen time, not eating late into the evening or possibly using meditation and white noise.  I did tell her she could use Benadryl 25 mg nightly as needed for a short-term for the next 4 to 5 days to help her catch up with her sleep.  Her Lasix was for 10 days post discharge and will most likely be able to be stopped at that time.  She reports that her blood pressure has been high at times but on today's visit systolic was only 95.  I did not make any changes to her current medication as it is uncertain how her blood pressure control is.  She does not have any recent lightheadedness or dizziness.  She has a scheduled appointment to see Dr. Roxan Hockey in 2 weeks and we will keep that.  She knows she can call us prior to that should her symptoms become worse or she wants to be seen prior to that.  She is scheduled to see Dr. Einar Gip with cardiology on 08/12/2021.      Medication Changes: No orders of the defined types were placed in this encounter.     John Giovanni, PA-C  08/06/2021 2:08 PM

## 2021-08-06 NOTE — Progress Notes (Signed)
Primary Physician/Referring:  Binnie Rail, MD  Patient ID: Marissa Lee, female    DOB: June 20, 1951, 70 y.o.   MRN: [id number]  Chief Complaint  Patient presents with   Hospitalization Follow-up    2 WEEKS   Shortness of Breath   CHEST PAIN   DOE   HPI:    Marissa Lee  is a 70 y.o. Caucasian female patient with no significant prior cardiovascular history, hypercholesterolemia presented on 07/27/2021 with unstable angina with substernal chest pain with radiation to the back, arm and jaw.  Underwent cardiac catheterization following morning revealed 90% ostial left main and mid left main disease for which he underwent emergent CABG on 07/28/2021 with LIMA to LAD and SVG to OM1.  Patient had an appointment to see me prior to her presentation with ACS, but was taken care of by [facility], but wanted me to follow-up on her as she requested.  She is presently doing well, has noticed decreased exercise tolerance and dyspnea on exertion.  She has not been using any pain medications although has chest wall pain from surgery 2 weeks ago.  She has occasional dizziness.  Overall states that she is doing well but still feels weak generally.  Past Medical History:  Diagnosis Date   Anxiety    Endometriosis    Fibrocystic breast    muliple drainage   Fibroid    Hiatal hernia    History of bronchitis    Hypercholesteremia 2007   currently not taking anything for it   Osteopenia 08/2016   hip and spine   Subclinical hypothyroidism 10/04/2018   Urge incontinence 06/05/2013   Past Surgical History:  Procedure Laterality Date   ABDOMINAL HYSTERECTOMY     BLADDER SUSPENSION  2004   done with [place]   done 3x   CLOSED REDUCTION METACARPAL WITH PERCUTANEOUS PINNING Right 08/24/2016   Procedure: CLOSED REDUCTION  WITH PERCUTANEOUS PINNING;  Surgeon: Iran Planas, MD;  Location: [place];  Service: Orthopedics;  Laterality: Right;   COLONOSCOPY     COMBINED HYSTERECTOMY VAGINAL /  OOPHORECTOMY / A&P REPAIR  2004   CORONARY ARTERY BYPASS GRAFT N/A 07/28/2021   Procedure: CORONARY ARTERY BYPASS GRAFTING (CABG) times two using left internal mammary artery and  left leg saphenous vein;  Surgeon: [name], MD;  Location: [place];  Service: Open Heart Surgery;  Laterality: N/A;   FOOT SURGERY Right 1992   IABP INSERTION Right 07/28/2021   Procedure: IABP Insertion;  Surgeon: Leonie Man, MD;  Location: [place] [facility];  Service: Cardiovascular;  Laterality: Right;   LEFT HEART CATH AND CORONARY ANGIOGRAPHY N/A 07/28/2021   Procedure: LEFT HEART CATH AND CORONARY ANGIOGRAPHY;  Surgeon: Leonie Man, MD;  Location: [place] [facility];  Service: Cardiovascular;  Laterality: N/A;   OPEN REDUCTION INTERNAL FIXATION (ORIF) HAND Right 08/24/2016   Procedure: RIGHT HAND LONG RING AND SMALL FINGER  POSSIBLE ORIF;  Surgeon: Iran Planas, MD;  Location: [place];  Service: Orthopedics;  Laterality: Right;   WISDOM TOOTH EXTRACTION  1978   Family History  Problem Relation Age of Onset   Cancer Paternal Grandmother        Colon    Cancer Paternal Grandfather        Lung   Heart disease Mother        valvular disease, valve replacement   Atrial fibrillation Mother    Stroke Mother    Heart disease  Brother        valve disease, valve surgery   Thyroid disease Maternal Grandmother        Goiter   Thyroid disease Daughter        Hypothyroid    Social History   Tobacco Use   Smoking status: Former    Packs/day: 1.00    Years: 20.00    Pack years: 20.00    Types: Cigarettes   Smokeless tobacco: Never   Tobacco comments:    quit in approximately 2000  Substance Use Topics   Alcohol use: Yes    Alcohol/week: 3.0 - 4.0 standard drinks    Types: 2 Cans of beer, 1 - 2 Standard drinks or equivalent per week    Comment: rarely   Marital Status: Single  ROS  Review of Systems  Constitutional: Positive for malaise/fatigue.  Cardiovascular:  Positive for chest  pain (post thorocotomy pain) and dyspnea on exertion. Negative for leg swelling.  Gastrointestinal:  Negative for melena.  Objective  Blood pressure 109/71, pulse 93, temperature 98 F (36.7 C), temperature source Temporal, resp. rate 17, height 5\' 4"  (1.626 m), weight 133 lb 6.4 oz (60.5 kg), last menstrual period 09/27/2003, SpO2 99 %. Body mass index is 22.9 kg/m.  Vitals with BMI 08/12/2021 08/06/2021 08/03/2021  Height 5\' 4"  5\' 4"  -  Weight 133 lbs 6 oz 136 lbs -  BMI 80.99 83.38 -  Systolic 250 95 539  Diastolic 71 58 55  Pulse 93 82 78     Physical Exam Neck:     Vascular: No carotid bruit or JVD.  Cardiovascular:     Rate and Rhythm: Normal rate and regular rhythm.     Pulses: Intact distal pulses.     Heart sounds: Normal heart sounds. No murmur heard.   No gallop.  Pulmonary:     Effort: Pulmonary effort is normal.     Breath sounds: Normal breath sounds.  Chest:     Comments: Sternotomy site has healed well. Abdominal:     General: Bowel sounds are normal.     Palpations: Abdomen is soft.  Musculoskeletal:        General: No swelling.     Laboratory examination:   Recent Labs    07/30/21 0356 07/31/21 0313 08/03/21 0309  NA 136 135 140  K 3.7 5.0 4.0  CL 108 107 107  CO2 24 25 25   GLUCOSE 119* 102* 107*  BUN 7* 9 9  CREATININE 0.79 0.76 0.73  CALCIUM 8.1* 8.2* 8.5*  GFRNONAA >60 >60 >60   estimated creatinine clearance is 58.1 mL/min (by C-G formula based on SCr of 0.73 mg/dL).  CMP Latest Ref Rng & Units 08/03/2021 07/31/2021 07/30/2021  Glucose 70 - 99 mg/dL 107(H) 102(H) 119(H)  BUN 8 - 23 mg/dL 9 9 7(L)  Creatinine 0.44 - 1.00 mg/dL 0.73 0.76 0.79  Sodium 135 - 145 mmol/L 140 135 136  Potassium 3.5 - 5.1 mmol/L 4.0 5.0 3.7  Chloride 98 - 111 mmol/L 107 107 108  CO2 22 - 32 mmol/L 25 25 24   Calcium 8.9 - 10.3 mg/dL 8.5(L) 8.2(L) 8.1(L)  Total Protein 6.0 - 8.3 g/dL - - -  Total Bilirubin 0.2 - 1.2 mg/dL - - -  Alkaline Phos 39 - 117 U/L - - -   AST 0 - 37 U/L - - -  ALT 0 - 35 U/L - - -   CBC Latest Ref Rng & Units 08/03/2021 07/31/2021 07/30/2021  WBC  4.0 - 10.5 K/uL 7.3 9.9 10.0  Hemoglobin 12.0 - 15.0 g/dL 7.9(L) 8.7(L) 8.2(L)  Hematocrit 36.0 - 46.0 % 24.5(L) 26.8(L) 24.6(L)  Platelets 150 - 400 K/uL 327 157 145(L)    Lipid Panel Recent Labs    07/28/21 0238  CHOL 236*  TRIG 144  LDLCALC 158*  VLDL 29  HDL 49  CHOLHDL 4.8  Lipid profile 10/04/2018: Total cholesterol 398, triglycerides 171, HDL 55, LDL 218.  Lipid profile 10/30/2019: Total cholesterol 245, triglycerides 187, HDL 53, LDL 154. Lipid Panel     Component Value Date/Time   CHOL 236 (H) 07/28/2021 0238   TRIG 144 07/28/2021 0238   HDL 49 07/28/2021 0238   CHOLHDL 4.8 07/28/2021 0238   VLDL 29 07/28/2021 0238   LDLCALC 158 (H) 07/28/2021 0238     HEMOGLOBIN A1C Lab Results  Component Value Date   HGBA1C 5.2 07/28/2021   MPG 102.54 07/28/2021   TSH Recent Labs    07/27/21 1948  TSH 3.643   Medications and allergies   Allergies  Allergen Reactions   Cephalosporins Anaphylaxis and Swelling    Throat swells   Clindamycin/Lincomycin     Metallic taste, blisters behind ears   Codeine Itching     Medication prior to this encounter:   Outpatient Medications Prior to Visit  Medication Sig Dispense Refill   acetaminophen (TYLENOL) 500 MG tablet Take 1,000 mg by mouth every 6 (six) hours as needed for moderate pain or headache.     albuterol (VENTOLIN HFA) 108 (90 Base) MCG/ACT inhaler Inhale 2 puffs into the lungs every 6 (six) hours as needed for shortness of breath or wheezing.     aspirin EC 81 MG EC tablet Take 1 tablet (81 mg total) by mouth daily. Swallow whole. 30 tablet 11   atorvastatin (LIPITOR) 40 MG tablet Take 1 tablet (40 mg total) by mouth daily. 30 tablet 1   Calcium-Phosphorus-Vitamin D (CITRACAL +D3 PO) Take by mouth daily.     clopidogrel (PLAVIX) 75 MG tablet Take 1 tablet (75 mg total) by mouth daily. 30 tablet 1    Dextrose, Diabetic Use, (GLUCOSE PO) Take 1 tablet by mouth daily as needed. When feels like sugar is low     diphenhydrAMINE (BENADRYL ALLERGY) 25 MG tablet Take 25 mg by mouth at bedtime.     ferrous sulfate 325 (65 FE) MG tablet Take 1 tablet (325 mg total) by mouth daily with breakfast. For one month then stop.  3   FLUoxetine (PROZAC) 40 MG capsule TAKE ONE CAPSULE BY MOUTH EVERY DAY (Patient taking differently: Take 40 mg by mouth daily.) 90 capsule 3   guaiFENesin (MUCINEX) 600 MG 12 hr tablet Take 1 tablet (600 mg total) by mouth 2 (two) times daily as needed for to loosen phlegm or cough.     lisinopril (ZESTRIL) 5 MG tablet Take 1 tablet (5 mg total) by mouth daily. 30 tablet 1   metoprolol succinate (TOPROL-XL) 25 MG 24 hr tablet Take 1 tablet (25 mg total) by mouth daily. 60 tablet 1   traMADol (ULTRAM) 50 MG tablet Take 1 tablet (50 mg total) by mouth every 6 (six) hours as needed. 28 tablet 0   furosemide (LASIX) 40 MG tablet Take 1 tablet (40 mg total) by mouth daily. For 10 days then stop 10 tablet 0   potassium chloride SA (KLOR-CON) 20 MEQ tablet Take 1 tablet (20 mEq total) by mouth daily. For ten days then stop 10 tablet 0  ezetimibe (ZETIA) 10 MG tablet Take 1 tablet (10 mg total) by mouth daily. (Patient not taking: Reported on 08/06/2021) 90 tablet 3   melatonin 5 MG TABS Take 5 mg by mouth at bedtime as needed (sleep).     No facility-administered medications prior to visit.     Medication list after today's encounter   Current Outpatient Medications  Medication Instructions   acetaminophen (TYLENOL) 1,000 mg, Oral, Every 6 hours PRN   albuterol (VENTOLIN HFA) 108 (90 Base) MCG/ACT inhaler 2 puffs, Inhalation, Every 6 hours PRN   aspirin 81 mg, Oral, Daily, Swallow whole.   atorvastatin (LIPITOR) 40 mg, Oral, Daily   Calcium-Phosphorus-Vitamin D (CITRACAL +D3 PO) Oral, Daily   clopidogrel (PLAVIX) 75 mg, Oral, Daily   Dextrose, Diabetic Use, (GLUCOSE PO) 1 tablet,  Oral, Daily PRN, When feels like sugar is low    diphenhydrAMINE (BENADRYL ALLERGY) 25 mg, Oral, Daily at bedtime   ferrous sulfate 325 mg, Oral, Daily with breakfast, For one month then stop.   FLUoxetine (PROZAC) 40 MG capsule TAKE ONE CAPSULE BY MOUTH EVERY DAY   guaiFENesin (MUCINEX) 600 mg, Oral, 2 times daily PRN   lisinopril (ZESTRIL) 5 mg, Oral, Daily   metoprolol succinate (TOPROL-XL) 25 mg, Oral, Daily   nitroGLYCERIN (NITROSTAT) 0.4 mg, Sublingual, Every 5 min PRN   pantoprazole (PROTONIX) 40 mg, Oral, Daily before breakfast   traMADol (ULTRAM) 50 mg, Oral, Every 6 hours PRN   Radiology:   Chest x-ray 08/05/2021: Unchanged cardiomediastinal silhouette. Prior median sternotomy and CABG. There are small bilateral pleural effusions and bibasilar opacities, likely atelectasis. Obliquely oriented bandlike opacity in the right lung, similar to prior exam, likely scarring or atelectasis. No visible pneumothorax. No acute osseous abnormality.  Cardiac Studies:   CABG for critical left main disease 07/28/2021: LIMA to LAD and SVG to OM1  Coronary angiogram 07/28/2021: 1. Severe Left Main CAD-ostial 90% with catheter damping and no resolution with IC nitro ? Excellent LAD, diagonal and LCx targets 2. Angiographically normal Right Coronary Artery with RPDA and PL system. 3. Normal/preserved LVEF with normal EDP. 4. Successful placement of 50 cc IABP pump via RFA 8 French sheath  Echocardiogram 07/28/2021:  1. Left ventricular ejection fraction, by estimation, is 60 to 65%. The left ventricle has normal function. The left ventricle has no regional wall motion abnormalities. Left ventricular diastolic parameters are indeterminate.  2. Right ventricular systolic function is normal. The right ventricular size is normal. There is normal pulmonary artery systolic pressure.  3. Left atrial size was mildly dilated.  EKG:   EKG 08/12/2021: Normal sinus rhythm at rate of 87 bpm, left axis  deviation, left anterior fascicular block.  Incomplete right bundle branch block.  Diffuse nonspecific T abnormality.  Cannot exclude anterolateral ischemia. Compared to EKG 07/29/2021, anterolateral T wave inversion is new, probably suggest post CABG changes. Assessment     ICD-10-CM   1. Coronary artery disease involving native coronary artery of native heart without angina pectoris  I25.10 EKG 12-Lead    nitroGLYCERIN (NITROSTAT) 0.4 MG SL tablet    2. 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 Familial hyperlipidemia  E78.49     4. Gastroesophageal reflux disease without esophagitis  K21.9 pantoprazole (PROTONIX) 40 MG tablet       Medications Discontinued During This Encounter  Medication Reason   ezetimibe (ZETIA) 10 MG tablet    melatonin 5  MG TABS    furosemide (LASIX) 40 MG tablet Completed Course   potassium chloride SA (KLOR-CON) 20 MEQ tablet Completed Course    Meds ordered this encounter  Medications   pantoprazole (PROTONIX) 40 MG tablet    Sig: Take 1 tablet (40 mg total) by mouth daily before breakfast.    Dispense:  30 tablet    Refill:  2   nitroGLYCERIN (NITROSTAT) 0.4 MG SL tablet    Sig: Place 1 tablet (0.4 mg total) under the tongue every 5 (five) minutes as needed for up to 25 days for chest pain.    Dispense:  25 tablet    Refill:  3    Orders Placed This Encounter  Procedures   AMB referral to cardiac rehabilitation    Referral Priority:   Routine    Referral Type:   Consultation    Number of Visits Requested:   1   EKG 12-Lead   Recommendations:   Aiva Miskell is a 70 y.o. Caucasian female patient with no significant prior cardiovascular history, hypercholesterolemia presented on 07/27/2021 with unstable angina with substernal chest pain with radiation to the back, arm and jaw.  Underwent cardiac catheterization following morning revealed 90% ostial left main and mid left main disease for which he  underwent emergent CABG on 07/28/2021 with LIMA to LAD and SVG to OM1.  Except for postoperative mild chest discomfort, mild dyspnea on exertion and fatigue she has no specific complaints.  On physical exam there is no clinical evidence of heart failure, blood pressure is well controlled and she is on appropriate medical therapy.  I have discontinued her furosemide.  Continue aspirin and Plavix for now.  As her blood pressure is soft, I did not make any additional changes to her beta-blocker or ACE inhibitor.  With regard to cardiac rehab, as there is a long wait, I will go ahead and place the order for rehab I suspect she will be ready for the rehab in 4 to 6 weeks.  Fortunately although she had significant left main disease and near cardiogenic shock, she had essentially normal vessels otherwise and expect she will do well on a long-term basis.  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  Her total cholesterol was in 300 range just at year ago or so.  She is now on atorvastatin 40 mg, we will recheck lipids soon, goal LDL closer to 54.  I will place orders for lipids in 4 weeks when she comes back to see me again.  I sent in a prescription for nitroglycerin sublingual.  She also complains of severe GERD since she has been on aspirin and Plavix, I will prescribe her Protonix.  This was a 60-minute office visit encounter in evaluation of her external records, review of her angiograms, hospitalization records and coordination of care.    Adrian Prows, MD, [facility] 08/12/2021, 5:29 PM Office: [phone]

## 2021-08-09 LAB — ECHO INTRAOPERATIVE TEE
Height: 64 in
Weight: 2240 oz

## 2021-08-10 ENCOUNTER — Ambulatory Visit: Payer: Medicare Other

## 2021-08-10 ENCOUNTER — Telehealth: Payer: Self-pay | Admitting: *Deleted

## 2021-08-10 NOTE — Telephone Encounter (Signed)
Transition Care Management Follow-up Telephone Call Date of discharge and from where: 08/03/21 [facility] How have you been since you were released from the hospital? "Still short of breath often" Any questions or concerns? Yes- she wants to find a new primary care provider  Items Reviewed: Did the pt receive and understand the discharge instructions provided? Yes  Medications obtained and verified? Yes  Other? No  Any new allergies since your discharge? No  Dietary orders reviewed? Yes Do you have support at home? Yes - her friend that was staying with her from [place] left today, her daughter lives near by  [place] and Equipment/Supplies: Were home health services ordered? yes If so, what is the name of the agency? [facility]  Has the agency set up a time to come to the patient's home? yes Were any new equipment or medical supplies ordered?  Yes: 3 in 1  What is the name of the medical supply agency? [employer] Were you able to get the supplies/equipment? yes Do you have any questions related to the use of the equipment or supplies? No  Functional Questionnaire: (I = Independent and D = Dependent) ADLs: I  Bathing/Dressing- I  Meal Prep- Having meals delivered to her home  Eating- I  Maintaining continence- I  Transferring/Ambulation- I  Managing Meds- I  Follow up appointments reviewed:  PCP Hospital f/u appt confirmed? No  - patient wants help securing new PCP [facility] f/u appt confirmed? Yes  Scheduled to see Dr Einar Gip on 08/12/21 @ 9:00 am. Are transportation arrangements needed? No  If their condition worsens, is the pt aware to call PCP or go to the Emergency Dept.? Yes Was the patient provided with contact information for the PCP's office or ED? Yes Was to pt encouraged to call back with questions or concerns? Yes

## 2021-08-12 ENCOUNTER — Other Ambulatory Visit: Payer: Self-pay

## 2021-08-12 ENCOUNTER — Ambulatory Visit: Payer: Medicare Other | Admitting: Cardiology

## 2021-08-12 ENCOUNTER — Encounter: Payer: Self-pay | Admitting: Cardiology

## 2021-08-12 VITALS — BP 109/71 | HR 93 | Temp 98.0°F | Resp 17 | Ht 64.0 in | Wt 133.4 lb

## 2021-08-12 DIAGNOSIS — Z951 Presence of aortocoronary bypass graft: Secondary | ICD-10-CM

## 2021-08-12 DIAGNOSIS — I2510 Atherosclerotic heart disease of native coronary artery without angina pectoris: Secondary | ICD-10-CM

## 2021-08-12 DIAGNOSIS — E7849 Other hyperlipidemia: Secondary | ICD-10-CM

## 2021-08-12 DIAGNOSIS — K219 Gastro-esophageal reflux disease without esophagitis: Secondary | ICD-10-CM

## 2021-08-12 DIAGNOSIS — E7800 Pure hypercholesterolemia, unspecified: Secondary | ICD-10-CM

## 2021-08-12 MED ORDER — PANTOPRAZOLE SODIUM 40 MG PO TBEC: 40.0000 mg | DELAYED_RELEASE_TABLET | Freq: Every day | ORAL | 2 refills | Status: DC

## 2021-08-12 MED ORDER — NITROGLYCERIN 0.4 MG SL SUBL
0.4000 mg | SUBLINGUAL_TABLET | SUBLINGUAL | 3 refills | Status: DC | PRN
Start: 2021-08-12 — End: 2022-12-14

## 2021-08-16 ENCOUNTER — Telehealth: Payer: Self-pay

## 2021-08-16 NOTE — Telephone Encounter (Signed)
I am okay with this and can sign off on papers

## 2021-08-16 NOTE — Telephone Encounter (Signed)
Manus Rudd, a nurse with [facility], called and stated that they had an order to see her out of the hospital. They went to see her over the weekend. Pt has an appointment to see a new PCP  09/07/2021. They would like to see if you could sign the orders so they can continue seeing her. They also would like a Education officer, museum and a home health aid to come help her. She did not have a PCP previously. They will take a verbal order. Please advise.  [employer] [phone]

## 2021-08-16 NOTE — Telephone Encounter (Signed)
Attempted to call nurse, no answer. Left vm requesting call back.

## 2021-08-17 NOTE — Telephone Encounter (Signed)
Called and spoke to pt, pt voiced understanding. Transferred to the front to schedule an appointment.

## 2021-08-17 NOTE — Telephone Encounter (Signed)
She has an appointment to see Dr. Roxan Hockey in 2 to 3 days, continue the same, I will be happy to see her back if I need to see her sooner, potentially I can see her tomorrow

## 2021-08-17 NOTE — Telephone Encounter (Signed)
Second attempt to call nurse, no answer. Left vm requesting call back.

## 2021-08-17 NOTE — Telephone Encounter (Signed)
Beth the nurse with [facility] called back and stated she will fax the paper work over to the office. She is also concerned because the patient is SOB even at rest. They want to know if there is anything else she should be doing or if this is normal for her. Please advise.

## 2021-08-18 ENCOUNTER — Encounter: Payer: Self-pay | Admitting: Student

## 2021-08-18 ENCOUNTER — Ambulatory Visit: Payer: Medicare Other | Admitting: Student

## 2021-08-18 ENCOUNTER — Other Ambulatory Visit: Payer: Self-pay

## 2021-08-18 VITALS — BP 112/59 | HR 76 | Temp 97.8°F | Resp 16 | Ht 64.0 in | Wt 133.0 lb

## 2021-08-18 DIAGNOSIS — R0602 Shortness of breath: Secondary | ICD-10-CM

## 2021-08-18 MED ORDER — METOPROLOL SUCCINATE ER 25 MG PO TB24: 12.5000 mg | ORAL_TABLET | Freq: Every day | ORAL | 1 refills | Status: DC

## 2021-08-18 NOTE — Progress Notes (Signed)
Primary Physician/Referring:  No primary care provider on file.  Patient ID: Marissa Lee, female    DOB: [birth year], 70 y.o.   MRN: [id number]  Chief Complaint  Patient presents with   Shortness of Breath   Follow-up   HPI:    Marissa Lee  is a 70 y.o. Caucasian female patient with no significant prior cardiovascular history, hypercholesterolemia presented on 07/27/2021 with unstable angina with substernal chest pain with radiation to the back, arm and jaw.  Underwent cardiac catheterization following morning revealed 90% ostial left main and mid left main disease for which he underwent emergent CABG on 07/28/2021 with LIMA to LAD and SVG to OM1.  Patient was last seen by Dr. Einar Gip for hospital follow up on 08/12/21 at which time patient's primary concern was dyspnea on exertion and peri-incisional pain. Dr. Einar Gip stopped her lasix at that time due to soft blood pressure and referral was sent to cardiac rehab. Patient now presents today for urgent visit at the advice of her home health nurse due to lightheadedness and soft blood pressure which have been noted at home. Patient does also continue to have dyspnea on exertion which is unchanged compared to previous office visit.   Past Medical History:  Diagnosis Date   Anxiety    Endometriosis    Fibrocystic breast    muliple drainage   Fibroid    Hiatal hernia    History of bronchitis    Hypercholesteremia 2007   currently not taking anything for it   Osteopenia 08/2016   hip and spine   Subclinical hypothyroidism 10/04/2018   Urge incontinence 06/05/2013   Past Surgical History:  Procedure Laterality Date   ABDOMINAL HYSTERECTOMY     BLADDER SUSPENSION  2004   done with Delta   done 3x   CLOSED REDUCTION METACARPAL WITH PERCUTANEOUS PINNING Right 08/24/2016   Procedure: CLOSED REDUCTION  WITH PERCUTANEOUS PINNING;  Surgeon: Iran Planas, MD;  Location: [facility];  Service: Orthopedics;  Laterality: Right;    COLONOSCOPY     COMBINED HYSTERECTOMY VAGINAL / OOPHORECTOMY / A&P REPAIR  2004   CORONARY ARTERY BYPASS GRAFT N/A 07/28/2021   Procedure: CORONARY ARTERY BYPASS GRAFTING (CABG) times two using left internal mammary artery and  left leg saphenous vein;  Surgeon: [name], MD;  Location: [facility];  Service: Open Heart Surgery;  Laterality: N/A;   FOOT SURGERY Right 1992   IABP INSERTION Right 07/28/2021   Procedure: IABP Insertion;  Surgeon: Leonie Man, MD;  Location: [facility];  Service: Cardiovascular;  Laterality: Right;   LEFT HEART CATH AND CORONARY ANGIOGRAPHY N/A 07/28/2021   Procedure: LEFT HEART CATH AND CORONARY ANGIOGRAPHY;  Surgeon: Leonie Man, MD;  Location: [facility];  Service: Cardiovascular;  Laterality: N/A;   OPEN REDUCTION INTERNAL FIXATION (ORIF) HAND Right 08/24/2016   Procedure: RIGHT HAND LONG RING AND SMALL FINGER  POSSIBLE ORIF;  Surgeon: Iran Planas, MD;  Location: [facility];  Service: Orthopedics;  Laterality: Right;   WISDOM TOOTH EXTRACTION  1978   Family History  Problem Relation Age of Onset   Cancer Paternal Grandmother        Colon    Cancer Paternal Grandfather        Lung   Heart disease Mother        valvular disease, valve replacement   Atrial fibrillation Mother    Stroke Mother    Heart disease Brother  valve disease, valve surgery   Thyroid disease Maternal Grandmother        Goiter   Thyroid disease Daughter        Hypothyroid    Social History   Tobacco Use   Smoking status: Former    Packs/day: 1.00    Years: 20.00    Pack years: 20.00    Types: Cigarettes   Smokeless tobacco: Never   Tobacco comments:    quit in approximately 2000  Substance Use Topics   Alcohol use: Yes    Alcohol/week: 3.0 - 4.0 standard drinks    Types: 2 Cans of beer, 1 - 2 Standard drinks or equivalent per week    Comment: rarely   Marital Status: Single  ROS  Review of Systems  Constitutional: Positive for  malaise/fatigue.  Cardiovascular:  Positive for chest pain (post thorocotomy pain) and dyspnea on exertion (stable). Negative for leg swelling.  Gastrointestinal:  Negative for melena.  Neurological:  Positive for light-headedness.  Objective  Blood pressure (!) 112/59, pulse 76, temperature 97.8 F (36.6 C), resp. rate 16, height 5\' 4"  (1.626 m), weight 133 lb (60.3 kg), last menstrual period 09/27/2003, SpO2 99 %. Body mass index is 22.83 kg/m.  Vitals with BMI 08/18/2021 08/12/2021 08/06/2021  Height 5\' 4"  5\' 4"  5\' 4"   Weight 133 lbs 133 lbs 6 oz 136 lbs  BMI 22.82 35.36 14.43  Systolic 154 008 95  Diastolic 59 71 58  Pulse 76 93 82     Physical Exam Neck:     Vascular: No carotid bruit or JVD.  Cardiovascular:     Rate and Rhythm: Normal rate and regular rhythm.     Pulses: Intact distal pulses.     Heart sounds: Normal heart sounds. No murmur heard.   No gallop.  Pulmonary:     Effort: Pulmonary effort is normal.     Breath sounds: Normal breath sounds.  Chest:     Comments: Sternotomy site has healed well. Abdominal:     General: Bowel sounds are normal.     Palpations: Abdomen is soft.  Musculoskeletal:        General: No swelling.   Physical exam unchanged compared to last office visit.   Laboratory examination:   Recent Labs    07/30/21 0356 07/31/21 0313 08/03/21 0309  NA 136 135 140  K 3.7 5.0 4.0  CL 108 107 107  CO2 24 25 25   GLUCOSE 119* 102* 107*  BUN 7* 9 9  CREATININE 0.79 0.76 0.73  CALCIUM 8.1* 8.2* 8.5*  GFRNONAA >60 >60 >60   estimated creatinine clearance is 58.1 mL/min (by C-G formula based on SCr of 0.73 mg/dL).  CMP Latest Ref Rng & Units 08/03/2021 07/31/2021 07/30/2021  Glucose 70 - 99 mg/dL 107(H) 102(H) 119(H)  BUN 8 - 23 mg/dL 9 9 7(L)  Creatinine 0.44 - 1.00 mg/dL 0.73 0.76 0.79  Sodium 135 - 145 mmol/L 140 135 136  Potassium 3.5 - 5.1 mmol/L 4.0 5.0 3.7  Chloride 98 - 111 mmol/L 107 107 108  CO2 22 - 32 mmol/L 25 25 24    Calcium 8.9 - 10.3 mg/dL 8.5(L) 8.2(L) 8.1(L)  Total Protein 6.0 - 8.3 g/dL - - -  Total Bilirubin 0.2 - 1.2 mg/dL - - -  Alkaline Phos 39 - 117 U/L - - -  AST 0 - 37 U/L - - -  ALT 0 - 35 U/L - - -   CBC Latest Ref Rng & Units  08/03/2021 07/31/2021 07/30/2021  WBC 4.0 - 10.5 K/uL 7.3 9.9 10.0  Hemoglobin 12.0 - 15.0 g/dL 7.9(L) 8.7(L) 8.2(L)  Hematocrit 36.0 - 46.0 % 24.5(L) 26.8(L) 24.6(L)  Platelets 150 - 400 K/uL 327 157 145(L)    Lipid Panel Recent Labs    07/28/21 0238  CHOL 236*  TRIG 144  LDLCALC 158*  VLDL 29  HDL 49  CHOLHDL 4.8  Lipid profile 10/04/2018: Total cholesterol 398, triglycerides 171, HDL 55, LDL 218.  Lipid profile 10/30/2019: Total cholesterol 245, triglycerides 187, HDL 53, LDL 154. Lipid Panel     Component Value Date/Time   CHOL 236 (H) 07/28/2021 0238   TRIG 144 07/28/2021 0238   HDL 49 07/28/2021 0238   CHOLHDL 4.8 07/28/2021 0238   VLDL 29 07/28/2021 0238   LDLCALC 158 (H) 07/28/2021 0238     HEMOGLOBIN A1C Lab Results  Component Value Date   HGBA1C 5.2 07/28/2021   MPG 102.54 07/28/2021   TSH Recent Labs    07/27/21 1948  TSH 3.643   Medications and allergies   Allergies  Allergen Reactions   Cephalosporins Anaphylaxis and Swelling    Throat swells   Clindamycin/Lincomycin     Metallic taste, blisters behind ears   Codeine Itching     Medication prior to this encounter:   Outpatient Medications Prior to Visit  Medication Sig Dispense Refill   acetaminophen (TYLENOL) 500 MG tablet Take 1,000 mg by mouth every 6 (six) hours as needed for moderate pain or headache.     aspirin EC 81 MG EC tablet Take 1 tablet (81 mg total) by mouth daily. Swallow whole. 30 tablet 11   atorvastatin (LIPITOR) 40 MG tablet Take 1 tablet (40 mg total) by mouth daily. 30 tablet 1   Calcium-Phosphorus-Vitamin D (CITRACAL +D3 PO) Take by mouth daily.     clopidogrel (PLAVIX) 75 MG tablet Take 1 tablet (75 mg total) by mouth daily. 30 tablet 1    Dextrose, Diabetic Use, (GLUCOSE PO) Take 1 tablet by mouth daily as needed. When feels like sugar is low     diphenhydrAMINE (BENADRYL) 25 MG tablet Take 25 mg by mouth at bedtime.     ferrous sulfate 325 (65 FE) MG tablet Take 1 tablet (325 mg total) by mouth daily with breakfast. For one month then stop.  3   FLUoxetine (PROZAC) 40 MG capsule TAKE ONE CAPSULE BY MOUTH EVERY DAY (Patient taking differently: Take 40 mg by mouth daily.) 90 capsule 3   guaiFENesin (MUCINEX) 600 MG 12 hr tablet Take 1 tablet (600 mg total) by mouth 2 (two) times daily as needed for to loosen phlegm or cough.     lisinopril (ZESTRIL) 5 MG tablet Take 1 tablet (5 mg total) by mouth daily. 30 tablet 1   nitroGLYCERIN (NITROSTAT) 0.4 MG SL tablet Place 1 tablet (0.4 mg total) under the tongue every 5 (five) minutes as needed for up to 25 days for chest pain. 25 tablet 3   pantoprazole (PROTONIX) 40 MG tablet Take 1 tablet (40 mg total) by mouth daily before breakfast. 30 tablet 2   metoprolol succinate (TOPROL-XL) 25 MG 24 hr tablet Take 1 tablet (25 mg total) by mouth daily. 60 tablet 1   albuterol (VENTOLIN HFA) 108 (90 Base) MCG/ACT inhaler Inhale 2 puffs into the lungs every 6 (six) hours as needed for shortness of breath or wheezing.     traMADol (ULTRAM) 50 MG tablet Take 1 tablet (50 mg total) by mouth every  6 (six) hours as needed. 28 tablet 0   No facility-administered medications prior to visit.     Medication list after today's encounter   Current Outpatient Medications  Medication Instructions   acetaminophen (TYLENOL) 1,000 mg, Oral, Every 6 hours PRN   aspirin 81 mg, Oral, Daily, Swallow whole.   atorvastatin (LIPITOR) 40 mg, Oral, Daily   Calcium-Phosphorus-Vitamin D (CITRACAL +D3 PO) Oral, Daily   clopidogrel (PLAVIX) 75 mg, Oral, Daily   Dextrose, Diabetic Use, (GLUCOSE PO) 1 tablet, Oral, Daily PRN, When feels like sugar is low    diphenhydrAMINE (BENADRYL) 25 mg, Oral, Daily at bedtime    ferrous sulfate 325 mg, Oral, Daily with breakfast, For one month then stop.   FLUoxetine (PROZAC) 40 MG capsule TAKE ONE CAPSULE BY MOUTH EVERY DAY   guaiFENesin (MUCINEX) 600 mg, Oral, 2 times daily PRN   lisinopril (ZESTRIL) 5 mg, Oral, Daily   metoprolol succinate (TOPROL-XL) 12.5 mg, Oral, Daily   nitroGLYCERIN (NITROSTAT) 0.4 mg, Sublingual, Every 5 min PRN   pantoprazole (PROTONIX) 40 mg, Oral, Daily before breakfast   Radiology:   Chest x-ray 08/05/2021: Unchanged cardiomediastinal silhouette. Prior median sternotomy and CABG. There are small bilateral pleural effusions and bibasilar opacities, likely atelectasis. Obliquely oriented bandlike opacity in the right lung, similar to prior exam, likely scarring or atelectasis. No visible pneumothorax. No acute osseous abnormality.  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 Excellent LAD, diagonal and LCx targets 2. Angiographically normal Right Coronary Artery with RPDA and PL system. 3.Normal/preserved LVEF with normal EDP. 4.Successful placement of 50 cc IABP pump via RFA 8 French sheath  Echocardiogram 07/28/2021:  1. Left ventricular ejection fraction, by estimation, is 60 to 65%. The left ventricle has normal function. The left ventricle has no regional wall motion abnormalities. Left ventricular diastolic parameters are indeterminate.  2. Right ventricular systolic function is normal. The right ventricular size is normal. There is normal pulmonary artery systolic pressure.  3. Left atrial size was mildly dilated.  EKG:   EKG 08/12/2021: Normal sinus rhythm at rate of 87 bpm, left axis deviation, left anterior fascicular block.  Incomplete right bundle branch block.  Diffuse nonspecific T abnormality.  Cannot exclude anterolateral ischemia. Compared to EKG  07/29/2021, anterolateral T wave inversion is new, probably suggest post CABG changes.  Assessment     ICD-10-CM   1. SOB (shortness of breath)  R06.02 CANCELED: EKG 12-Lead       Medications Discontinued During This Encounter  Medication Reason   traMADol (ULTRAM) 50 MG tablet    albuterol (VENTOLIN HFA) 108 (90 Base) MCG/ACT inhaler    metoprolol succinate (TOPROL-XL) 25 MG 24 hr tablet     Meds ordered this encounter  Medications   metoprolol succinate (TOPROL-XL) 25 MG 24 hr tablet    Sig: Take 0.5 tablets (12.5 mg total) by mouth daily.    Dispense:  60 tablet    Refill:  1    No orders of the defined types were placed in this encounter.  Recommendations:   Marissa Lee is a 70 y.o. Caucasian female patient with no significant prior cardiovascular history, hypercholesterolemia presented on 07/27/2021 with unstable angina with substernal chest pain with radiation to the back, arm and jaw.  Underwent cardiac catheterization following morning revealed 90% ostial left main and mid left main  disease for which he underwent emergent CABG on 07/28/2021 with LIMA to LAD and SVG to OM1.  Patient presents for urgent visit with concerns of lightheadedness and soft blood pressure. At last office visit Dr. Einar Gip had stopped Lasix due to soft blood pressure, however both office and home readings remain low and patient is complaining of lightheadedness. Will reduce Toprol-XL from 25 mg to 12.5 mg daily.   Patient will keep follow up with Dr. Roxan Hockey and Dr. Einar Gip as previously scheduled.    Alethia Berthold, PA-C 08/18/2021, 10:12 AM Office: [phone]

## 2021-08-23 ENCOUNTER — Other Ambulatory Visit: Payer: Self-pay | Admitting: Thoracic Surgery (Cardiothoracic Vascular Surgery)

## 2021-08-23 DIAGNOSIS — Z951 Presence of aortocoronary bypass graft: Secondary | ICD-10-CM

## 2021-08-24 ENCOUNTER — Encounter: Payer: Self-pay | Admitting: Thoracic Surgery (Cardiothoracic Vascular Surgery)

## 2021-08-24 ENCOUNTER — Other Ambulatory Visit: Payer: Self-pay

## 2021-08-24 ENCOUNTER — Ambulatory Visit (INDEPENDENT_AMBULATORY_CARE_PROVIDER_SITE_OTHER): Payer: Self-pay | Admitting: Thoracic Surgery (Cardiothoracic Vascular Surgery)

## 2021-08-24 ENCOUNTER — Encounter (HOSPITAL_COMMUNITY): Payer: Self-pay

## 2021-08-24 ENCOUNTER — Ambulatory Visit
Admission: RE | Admit: 2021-08-24 | Discharge: 2021-08-24 | Disposition: A | Discharge status: Home / Self Care | Payer: Medicare Other | Source: Ambulatory Visit | Attending: Thoracic Surgery (Cardiothoracic Vascular Surgery) | Admitting: Thoracic Surgery (Cardiothoracic Vascular Surgery)

## 2021-08-24 VITALS — BP 107/63 | HR 85 | Resp 20 | Ht 64.0 in | Wt 132.0 lb

## 2021-08-24 DIAGNOSIS — Z951 Presence of aortocoronary bypass graft: Secondary | ICD-10-CM

## 2021-08-24 NOTE — Telephone Encounter (Signed)
Pt insurance is active and benefits verified through [employer]. Co-pay $25.00, DED $0.00/$0.00 met, out of pocket $3,500.00/$720.23 met, co-insurance 0%. No pre-authorization required. Passport, 08/24/21 @ 12:11PM, REF#[id number] 

## 2021-08-24 NOTE — Progress Notes (Signed)
[address]       [place] [address]             [phone]     HPI: Marissa Lee returns for scheduled follow-up visit.  Marissa Lee is a 70 year old woman with no prior cardiac history who presented with chest pain.  Cardiac catheterization revealed severe ostial left main stenosis.  She had an intra-aortic balloon pump placed and was taken emergently for coronary bypass grafting x2 on 07/28/2021.  Her balloon pump was removed and she was weaned from the ventilator the following day.  Her postoperative course that was unremarkable and she went home on 08/03/2021.  Currently she is feeling well.  She denies any recurrent angina.  She has some mild incisional discomfort but is not taking any narcotics.  Her exercise tolerance is improving.  She was having some issues with lightheadedness and low blood pressure.  That has improved since her metoprolol dose was decreased.   Past Medical History:  Diagnosis Date   Anxiety    Endometriosis    Fibrocystic breast    muliple drainage   Fibroid    Hiatal hernia    History of bronchitis    Hypercholesteremia 2007   currently not taking anything for it   Osteopenia 08/2016   hip and spine   Subclinical hypothyroidism 10/04/2018   Urge incontinence 06/05/2013      Current Outpatient Medications  Medication Sig Dispense Refill   acetaminophen (TYLENOL) 500 MG tablet Take 1,000 mg by mouth every 6 (six) hours as needed for moderate pain or headache.     albuterol (VENTOLIN HFA) 108 (90 Base) MCG/ACT inhaler Inhale into the lungs every 6 (six) hours as needed for wheezing or shortness of breath.     aspirin EC 81 MG EC tablet Take 1 tablet (81 mg total) by mouth daily. Swallow whole. 30 tablet 11   atorvastatin (LIPITOR) 40 MG tablet Take 1 tablet (40 mg total) by mouth daily. 30 tablet 1   Calcium-Phosphorus-Vitamin D (CITRACAL +D3 PO) Take by mouth daily.     clopidogrel (PLAVIX) 75 MG tablet Take 1 tablet (75 mg  total) by mouth daily. 30 tablet 1   Dextrose, Diabetic Use, (GLUCOSE PO) Take 1 tablet by mouth daily as needed. When feels like sugar is low     diphenhydrAMINE (BENADRYL) 25 MG tablet Take 25 mg by mouth at bedtime.     ferrous sulfate 325 (65 FE) MG tablet Take 1 tablet (325 mg total) by mouth daily with breakfast. For one month then stop.  3   FLUoxetine (PROZAC) 40 MG capsule TAKE ONE CAPSULE BY MOUTH EVERY DAY (Patient taking differently: Take 40 mg by mouth daily.) 90 capsule 3   guaiFENesin (MUCINEX) 600 MG 12 hr tablet Take 1 tablet (600 mg total) by mouth 2 (two) times daily as needed for to loosen phlegm or cough.     lisinopril (ZESTRIL) 5 MG tablet Take 1 tablet (5 mg total) by mouth daily. 30 tablet 1   Melatonin 5 MG CAPS Take by mouth.     metoprolol succinate (TOPROL-XL) 25 MG 24 hr tablet Take 0.5 tablets (12.5 mg total) by mouth daily. 60 tablet 1   nitroGLYCERIN (NITROSTAT) 0.4 MG SL tablet Place 1 tablet (0.4 mg total) under the tongue every 5 (five) minutes as needed for up to 25 days for chest pain. 25 tablet 3   pantoprazole (PROTONIX) 40 MG tablet Take 1 tablet (40  mg total) by mouth daily before breakfast. 30 tablet 2   No current facility-administered medications for this visit.    Physical Exam BP 107/63 (BP Location: Right Arm, Patient Position: Sitting)   Pulse 85   Resp 20   Ht 5\' 4"  (1.626 m)   Wt 132 lb (59.9 kg)   LMP 09/27/2003   SpO2 96% Comment: RA  BMI 22.66 kg/m  Well-appearing 70 year old woman in no acute distress Alert and oriented x3 with no focal deficits Lungs clear with equal breath sounds bilaterally Cardiac regular rate and rhythm Sternal incision clean dry and intact, sternum stable No peripheral edema  Diagnostic Tests: I personally reviewed her chest x-ray.  There is no effusions or infiltrates.  Impression: Marissa Lee is a 70 year old woman with no prior cardiac history who presented with waxing and waning chest pain.  She was  found to have critical ostial left main stenosis and taken emergently for coronary artery bypass grafting on 07/28/2021.  She currently is doing well.  She is not had any recurrent anginal pain.  She is having minimal incisional pain.  Her exercise tolerance is good and improving.  She should not lift anything over 10 pounds for another 2 weeks and nothing over 20 pounds before the first of the year.  She may begin driving on a limited basis, appropriate precautions were discussed.  She is cleared to start cardiac rehab and if she would like  She will follow-up with Dr. Ellyn Hack.  She is currently scheduled to see a new primary care provider in a couple of weeks.  Plan: Follow-up with cardiology I will be happy to see Marissa Lee back anytime in the future if I can be of any further assistance with her care.  [name], MD [facility] [phone]

## 2021-08-24 NOTE — Telephone Encounter (Signed)
Attempted to call patient in regards to Cardiac Rehab - LM on VM Mailed letter 

## 2021-08-25 ENCOUNTER — Other Ambulatory Visit: Payer: Self-pay

## 2021-08-25 MED ORDER — CLOPIDOGREL BISULFATE 75 MG PO TABS: 75.0000 mg | ORAL_TABLET | Freq: Every day | ORAL | 3 refills | Status: DC

## 2021-08-25 MED ORDER — ATORVASTATIN CALCIUM 40 MG PO TABS: 40.0000 mg | ORAL_TABLET | Freq: Every day | ORAL | 3 refills | Status: DC

## 2021-08-25 MED ORDER — LISINOPRIL 5 MG PO TABS
5.0000 mg | ORAL_TABLET | Freq: Every day | ORAL | 3 refills | Status: DC
Start: 2021-08-25 — End: 2021-11-25

## 2021-08-25 NOTE — Progress Notes (Deleted)
70 y.o. [id number] [name] here for annual exam.    PCP:     Patient's last menstrual period was 09/27/2003.           Sexually active: {yes no:314532}  The current method of family planning is status post hysterectomy.    Exercising: {yes UK:025427}  {types:19826} Smoker:  Former  Health Maintenance: Pap: Years ago-Normal History of abnormal Pap:  no MMG:  03-09-21 3D/Neg/BiRads1 Colonoscopy: ***10-06-14 polyp;next 09/2019 BMD:  06-15-21  Result :Osteopenia TDaP: 06-30-16 Gardasil:   no HIV: 06-30-16 NR Hep C: 06-30-16 Neg Screening Labs:  Hb today: ***, Urine today: ***   reports that she has quit smoking. Her smoking use included cigarettes. She has a 20.00 pack-year smoking history. She has never used smokeless tobacco. She reports current alcohol use of about 3.0 - 4.0 standard drinks per week. She reports that she does not use drugs.  Past Medical History:  Diagnosis Date   Anxiety    Endometriosis    Fibrocystic breast    muliple drainage   Fibroid    Hiatal hernia    History of bronchitis    Hypercholesteremia 2007   currently not taking anything for it   Osteopenia 08/2016   hip and spine   Subclinical hypothyroidism 10/04/2018   Urge incontinence 06/05/2013    Past Surgical History:  Procedure Laterality Date   ABDOMINAL HYSTERECTOMY     BLADDER SUSPENSION  2004   done with [place]   done 3x   CLOSED REDUCTION METACARPAL WITH PERCUTANEOUS PINNING Right 08/24/2016   Procedure: CLOSED REDUCTION  WITH PERCUTANEOUS PINNING;  Surgeon: Iran Planas, MD;  Location: [facility];  Service: Orthopedics;  Laterality: Right;   COLONOSCOPY     COMBINED HYSTERECTOMY VAGINAL / OOPHORECTOMY / A&P REPAIR  2004   CORONARY ARTERY BYPASS GRAFT N/A 07/28/2021   Procedure: CORONARY ARTERY BYPASS GRAFTING (CABG) times two using left internal mammary artery and  left leg saphenous vein;  Surgeon: [name], MD;  Location: [facility];  Service: Open Heart  Surgery;  Laterality: N/A;   FOOT SURGERY Right 1992   IABP INSERTION Right 07/28/2021   Procedure: IABP Insertion;  Surgeon: Leonie Man, MD;  Location: [facility];  Service: Cardiovascular;  Laterality: Right;   LEFT HEART CATH AND CORONARY ANGIOGRAPHY N/A 07/28/2021   Procedure: LEFT HEART CATH AND CORONARY ANGIOGRAPHY;  Surgeon: Leonie Man, MD;  Location: [facility];  Service: Cardiovascular;  Laterality: N/A;   OPEN REDUCTION INTERNAL FIXATION (ORIF) HAND Right 08/24/2016   Procedure: RIGHT HAND LONG RING AND SMALL FINGER  POSSIBLE ORIF;  Surgeon: Iran Planas, MD;  Location: [facility];  Service: Orthopedics;  Laterality: Right;   WISDOM TOOTH EXTRACTION  1978    Current Outpatient Medications  Medication Sig Dispense Refill   acetaminophen (TYLENOL) 500 MG tablet Take 1,000 mg by mouth every 6 (six) hours as needed for moderate pain or headache.     albuterol (VENTOLIN HFA) 108 (90 Base) MCG/ACT inhaler Inhale into the lungs every 6 (six) hours as needed for wheezing or shortness of breath.     aspirin EC 81 MG EC tablet Take 1 tablet (81 mg total) by mouth daily. Swallow whole. 30 tablet 11   atorvastatin (LIPITOR) 40 MG tablet Take 1 tablet (40 mg total) by mouth daily. 90 tablet 3   Calcium-Phosphorus-Vitamin D (CITRACAL +D3 PO) Take by mouth daily.     clopidogrel (  PLAVIX) 75 MG tablet Take 1 tablet (75 mg total) by mouth daily. 90 tablet 3   Dextrose, Diabetic Use, (GLUCOSE PO) Take 1 tablet by mouth daily as needed. When feels like sugar is low     diphenhydrAMINE (BENADRYL) 25 MG tablet Take 25 mg by mouth at bedtime.     ferrous sulfate 325 (65 FE) MG tablet Take 1 tablet (325 mg total) by mouth daily with breakfast. For one month then stop.  3   FLUoxetine (PROZAC) 40 MG capsule TAKE ONE CAPSULE BY MOUTH EVERY DAY (Patient taking differently: Take 40 mg by mouth daily.) 90 capsule 3   guaiFENesin (MUCINEX) 600 MG 12 hr tablet Take 1 tablet (600 mg total) by  mouth 2 (two) times daily as needed for to loosen phlegm or cough.     lisinopril (ZESTRIL) 5 MG tablet Take 1 tablet (5 mg total) by mouth daily. 90 tablet 3   Melatonin 5 MG CAPS Take by mouth.     metoprolol succinate (TOPROL-XL) 25 MG 24 hr tablet Take 0.5 tablets (12.5 mg total) by mouth daily. 60 tablet 1   nitroGLYCERIN (NITROSTAT) 0.4 MG SL tablet Place 1 tablet (0.4 mg total) under the tongue every 5 (five) minutes as needed for up to 25 days for chest pain. 25 tablet 3   pantoprazole (PROTONIX) 40 MG tablet Take 1 tablet (40 mg total) by mouth daily before breakfast. 30 tablet 2   No current facility-administered medications for this visit.    Family History  Problem Relation Age of Onset   Cancer Paternal Grandmother        Colon    Cancer Paternal Grandfather        Lung   Heart disease Mother        valvular disease, valve replacement   Atrial fibrillation Mother    Stroke Mother    Heart disease Brother        valve disease, valve surgery   Thyroid disease Maternal Grandmother        Goiter   Thyroid disease Daughter        Hypothyroid    Review of Systems  Exam:   LMP 09/27/2003     General appearance: alert, cooperative and appears stated age Head: normocephalic, without obvious abnormality, atraumatic Neck: no adenopathy, supple, symmetrical, trachea midline and thyroid normal to inspection and palpation Lungs: clear to auscultation bilaterally Breasts: normal appearance, no masses or tenderness, No nipple retraction or dimpling, No nipple discharge or bleeding, No axillary adenopathy Heart: regular rate and rhythm Abdomen: soft, non-tender; no masses, no organomegaly Extremities: extremities normal, atraumatic, no cyanosis or edema Skin: skin color, texture, turgor normal. No rashes or lesions Lymph nodes: cervical, supraclavicular, and axillary nodes normal. Neurologic: grossly normal  Pelvic: External genitalia:  no lesions              No abnormal  inguinal nodes palpated.              Urethra:  normal appearing urethra with no masses, tenderness or lesions              Bartholins and Skenes: normal                 Vagina: normal appearing vagina with normal color and discharge, no lesions              Cervix: no lesions              Pap taken: {yes  TT:[id number]} Bimanual Exam:  Uterus:  normal size, contour, position, consistency, mobility, non-tender              Adnexa: no mass, fullness, tenderness              Rectal exam: {yes no:314532}.  Confirms.              Anus:  normal sphincter tone, no lesions  Chaperone was present for exam:  ***  Assessment:   Well woman visit with gynecologic exam.   Plan: Mammogram screening discussed. Self breast awareness reviewed. Pap and HR HPV as above. Guidelines for Calcium, Vitamin D, regular exercise program including cardiovascular and weight bearing exercise.   Follow up annually and prn.   Additional counseling given.  {yes [id number]. _______ minutes face to face time of which over 50% was spent in counseling.    After visit summary provided.

## 2021-09-01 ENCOUNTER — Ambulatory Visit: Payer: Medicare Other | Admitting: Obstetrics and Gynecology

## 2021-09-07 ENCOUNTER — Ambulatory Visit: Payer: Medicare Other | Admitting: Physician Assistant

## 2021-09-07 ENCOUNTER — Encounter: Payer: Self-pay | Admitting: Physician Assistant

## 2021-09-07 ENCOUNTER — Other Ambulatory Visit: Payer: Self-pay

## 2021-09-07 VITALS — BP 107/60 | HR 70 | Temp 98.0°F | Ht 64.0 in | Wt 131.2 lb

## 2021-09-07 DIAGNOSIS — D508 Other iron deficiency anemias: Secondary | ICD-10-CM

## 2021-09-07 DIAGNOSIS — Z951 Presence of aortocoronary bypass graft: Secondary | ICD-10-CM | POA: Diagnosis not present

## 2021-09-07 DIAGNOSIS — R0789 Other chest pain: Secondary | ICD-10-CM

## 2021-09-07 DIAGNOSIS — E782 Mixed hyperlipidemia: Secondary | ICD-10-CM

## 2021-09-07 LAB — COMPREHENSIVE METABOLIC PANEL
ALT: 16 U/L (ref 0–35)
AST: 17 U/L (ref 0–37)
Albumin: 4.1 g/dL (ref 3.5–5.2)
Alkaline Phosphatase: 62 U/L (ref 39–117)
BUN: 11 mg/dL (ref 6–23)
CO2: 28 mEq/L (ref 19–32)
Calcium: 9.6 mg/dL (ref 8.4–10.5)
Chloride: 104 mEq/L (ref 96–112)
Creatinine, Ser: 0.84 mg/dL (ref 0.40–1.20)
GFR: 71.1 mL/min (ref >60.00)
Glucose, Bld: 92 mg/dL (ref 70–99)
Potassium: 4.7 mEq/L (ref 3.5–5.1)
Sodium: 140 mEq/L (ref 135–145)
Total Bilirubin: 0.5 mg/dL (ref 0.2–1.2)
Total Protein: 7.1 g/dL (ref 6.0–8.3)

## 2021-09-07 LAB — CBC WITH DIFFERENTIAL/PLATELET
Basophils Absolute: 0.1 10*3/uL (ref 0.0–0.1)
Basophils Relative: 0.9 % (ref 0.0–3.0)
Eosinophils Absolute: 0.1 10*3/uL (ref 0.0–0.7)
Eosinophils Relative: 1.6 % (ref 0.0–5.0)
HCT: 37.2 % (ref 36.0–46.0)
Hemoglobin: 12 g/dL (ref 12.0–15.0)
Lymphocytes Relative: 20.1 % (ref 12.0–46.0)
Lymphs Abs: 1.1 10*3/uL (ref 0.7–4.0)
MCHC: 32.4 g/dL (ref 30.0–36.0)
MCV: 93.6 fl (ref 78.0–100.0)
Monocytes Absolute: 0.5 10*3/uL (ref 0.1–1.0)
Monocytes Relative: 9.5 % (ref 3.0–12.0)
Neutro Abs: 3.8 10*3/uL (ref 1.4–7.7)
Neutrophils Relative %: 67.9 % (ref 43.0–77.0)
Platelets: 404 10*3/uL — ABNORMAL HIGH (ref 150.0–400.0)
RBC: 3.97 Mil/uL (ref 3.87–5.11)
RDW: 14.4 % (ref 11.5–15.5)
WBC: 5.6 10*3/uL (ref 4.0–10.5)

## 2021-09-07 NOTE — Patient Instructions (Addendum)
Good to meet you today! Please go to the lab for blood work and I will send results through [facility].  F/up with Cardiologist as scheduled.  Go to ED for any sudden chest pressure, shortness of breath, fever, severe weakness, etc.

## 2021-09-07 NOTE — Progress Notes (Signed)
Subjective:    Patient ID: Marissa Lee, female    DOB: 1951/09/05, 70 y.o.   MRN: [id number]  Chief Complaint  Patient presents with   Establish Care    HPI 70 y.o. patient presents today for new patient establishment with me.  Patient was previously established with Dr. Quay Burow.  Current Care Team: Dr. 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 Roxan Hockey. She will f/up with cardiologist, Dr. Einar Gip, in about 9 days.  She does complain of some anterior chest pain at the upper part of her incision.  It is about 2-3 out of 10 off and on for the last few days and she has been waking up with some shortness of breath at night. Symptoms are not exertional. It does not feel similar when she presented to ED initially.  She says she has been feeling anxious since having the surgery. No fever or chills. No headaches or weakness.  No recent illness.  She says that she has been doing more than she should such as taking out the garbage which was probably more than 10 pounds of lifting.  It looks like she did follow-up with [facility] on 08/18/2021 for similar complaints.     Past Medical History:  Diagnosis Date   Anxiety    Endometriosis    Fibrocystic breast    muliple drainage   Fibroid    Hiatal hernia    History of bronchitis    Hypercholesteremia 2007   currently not taking anything for it   Osteopenia 08/2016   hip and spine   Subclinical hypothyroidism 10/04/2018   Urge incontinence 06/05/2013    Past Surgical History:  Procedure Laterality Date   ABDOMINAL HYSTERECTOMY     BLADDER SUSPENSION  2004   done with [place]   done 3x   CLOSED REDUCTION METACARPAL WITH PERCUTANEOUS PINNING Right 08/24/2016   Procedure: CLOSED REDUCTION  WITH PERCUTANEOUS PINNING;  Surgeon: Iran Planas, MD;  Location: [facility];  Service: Orthopedics;  Laterality: Right;   COLONOSCOPY     COMBINED HYSTERECTOMY VAGINAL / OOPHORECTOMY / A&P  REPAIR  2004   CORONARY ARTERY BYPASS GRAFT N/A 07/28/2021   Procedure: CORONARY ARTERY BYPASS GRAFTING (CABG) times two using left internal mammary artery and  left leg saphenous vein;  Surgeon: [name], MD;  Location: [facility];  Service: Open Heart Surgery;  Laterality: N/A;   FOOT SURGERY Right 1992   IABP INSERTION Right 07/28/2021   Procedure: IABP Insertion;  Surgeon: Leonie Man, MD;  Location: [facility];  Service: Cardiovascular;  Laterality: Right;   LEFT HEART CATH AND CORONARY ANGIOGRAPHY N/A 07/28/2021   Procedure: LEFT HEART CATH AND CORONARY ANGIOGRAPHY;  Surgeon: Leonie Man, MD;  Location: [facility];  Service: Cardiovascular;  Laterality: N/A;   OPEN REDUCTION INTERNAL FIXATION (ORIF) HAND Right 08/24/2016   Procedure: RIGHT HAND LONG RING AND SMALL FINGER  POSSIBLE ORIF;  Surgeon: Iran Planas, MD;  Location: [facility];  Service: Orthopedics;  Laterality: Right;   WISDOM TOOTH EXTRACTION  1978    Family History  Problem Relation Age of Onset   Cancer Paternal Grandmother        Colon    Cancer Paternal Grandfather        Lung   Heart disease Mother        valvular disease, valve replacement   Atrial fibrillation Mother  Stroke Mother    Heart disease Brother        valve disease, valve surgery   Thyroid disease Maternal Grandmother        Goiter   Thyroid disease Daughter        Hypothyroid    Social History   Tobacco Use   Smoking status: Former    Packs/day: 1.00    Years: 20.00    Pack years: 20.00    Types: Cigarettes   Smokeless tobacco: Never   Tobacco comments:    quit in approximately 2000  Vaping Use   Vaping Use: Never used  Substance Use Topics   Alcohol use: Yes    Alcohol/week: 3.0 - 4.0 standard drinks    Types: 2 Cans of beer, 1 - 2 Standard drinks or equivalent per week    Comment: rarely   Drug use: No     Allergies  Allergen Reactions   Cephalosporins Anaphylaxis and Swelling    Throat swells    Clindamycin/Lincomycin     Metallic taste, blisters behind ears   Codeine Itching    Review of Systems NEGATIVE UNLESS OTHERWISE INDICATED IN HPI      Objective:     BP 107/60    Pulse 70    Temp 98 F (36.7 C)    Ht 5\' 4"  (1.626 m)    Wt 131 lb 3.2 oz (59.5 kg)    LMP 09/27/2003    SpO2 98%    BMI 22.52 kg/m   Wt Readings from Last 3 Encounters:  09/07/21 131 lb 3.2 oz (59.5 kg)  08/24/21 132 lb (59.9 kg)  08/18/21 133 lb (60.3 kg)    BP Readings from Last 3 Encounters:  09/07/21 107/60  08/24/21 107/63  08/18/21 (!) 112/59     Physical Exam Vitals and nursing note reviewed.  Constitutional:      Appearance: Normal appearance. She is normal weight. She is not toxic-appearing.  HENT:     Head: Normocephalic and atraumatic.     Right Ear: External ear normal.     Left Ear: External ear normal.     Nose: Nose normal.     Mouth/Throat:     Mouth: Mucous membranes are moist.  Eyes:     Extraocular Movements: Extraocular movements intact.     Conjunctiva/sclera: Conjunctivae normal.     Pupils: Pupils are equal, round, and reactive to light.  Cardiovascular:     Rate and Rhythm: Normal rate and regular rhythm.     Pulses: Normal pulses.     Heart sounds: Normal heart sounds.  Pulmonary:     Effort: Pulmonary effort is normal.     Breath sounds: Normal breath sounds.  Chest:     Comments: Sternotomy site well healed, some TTP and minimal irritation to superior scar. No major swelling or redness.  Abdominal:     Palpations: Abdomen is soft.  Musculoskeletal:        General: Normal range of motion.     Cervical back: Normal range of motion and neck supple.  Skin:    General: Skin is warm and dry.  Neurological:     General: No focal deficit present.     Mental Status: She is alert and oriented to person, place, and time.  Psychiatric:        Mood and Affect: Mood normal.        Behavior: Behavior normal.        Thought Content: Thought content normal.  Judgment: Judgment normal.       Assessment & Plan:   Problem List Items Addressed This Visit       Other   Hyperlipidemia   S/P CABG x 2 - Primary   Relevant Orders   CBC with Differential/Platelet   Comprehensive metabolic panel   Iron, TIBC and Ferritin Panel   Other Visit Diagnoses     Chest wall tenderness       Relevant Orders   CBC with Differential/Platelet   Comprehensive metabolic panel   Iron, TIBC and Ferritin Panel   Other iron deficiency anemia       Relevant Orders   CBC with Differential/Platelet   Comprehensive metabolic panel   Iron, TIBC and Ferritin Panel   Low calcium levels       Relevant Orders   Comprehensive metabolic panel      Plan: -Recheck CBC and CMP today as review of labs from ED showed anemia and low calcium. Lipid panel to be repeated in about 6 months. -Chest wall tenderness most likely 2/2 healing from CABG; I do not see signs of infection. Sx's do not appear cardiac in nature. -Red flags discussed when to present to ED if needed -F/up with cards as scheduled -Recheck here in about 3 months    This note was prepared with assistance of Dragon voice recognition software. Occasional wrong-word or sound-a-like substitutions may have occurred due to the inherent limitations of voice recognition software.  Time Spent: 40 minutes of total time was spent on the date of the encounter performing the following actions: chart review prior to seeing the patient, obtaining history, performing a medically necessary exam, counseling on the treatment plan, placing orders, and documenting in our EHR.    Tarquin Welcher M Emonie Espericueta, PA-C

## 2021-09-08 ENCOUNTER — Telehealth (HOSPITAL_COMMUNITY): Payer: Self-pay

## 2021-09-08 LAB — IRON,TIBC AND FERRITIN PANEL
%SAT: 17 % (calc) (ref 16–45)
Ferritin: 72 ng/mL (ref 16–288)
Iron: 55 ug/dL (ref 45–160)
TIBC: 332 mcg/dL (calc) (ref 250–450)

## 2021-09-08 NOTE — Telephone Encounter (Signed)
Had a cancellation for cardiac rehab and left a message for pt to call back to see if she would like to come to cardiac rehab earlier than Jan 2023.

## 2021-09-09 ENCOUNTER — Other Ambulatory Visit: Payer: Self-pay | Admitting: Obstetrics and Gynecology

## 2021-09-10 ENCOUNTER — Other Ambulatory Visit: Payer: Self-pay

## 2021-09-10 ENCOUNTER — Telehealth: Payer: Self-pay | Admitting: Physician Assistant

## 2021-09-10 NOTE — Telephone Encounter (Signed)
Patient called because she needs her FLUoxetine (PROZAC) 40 MG capsule to be prescribed by Alyssa, as she is no longer seeing prescribing provider for it.   Please send new prescription to  [facility], [address] Phone:  [phone]  Fax:  [phone]       Please advise

## 2021-09-12 MED ORDER — FLUOXETINE HCL 40 MG PO CAPS: ORAL_CAPSULE | ORAL | 3 refills | Status: DC

## 2021-09-13 ENCOUNTER — Ambulatory Visit (INDEPENDENT_AMBULATORY_CARE_PROVIDER_SITE_OTHER): Payer: Self-pay | Admitting: Surgical

## 2021-09-13 ENCOUNTER — Ambulatory Visit: Payer: Medicare Other | Admitting: Cardiology

## 2021-09-13 ENCOUNTER — Other Ambulatory Visit: Payer: Self-pay

## 2021-09-13 VITALS — BP 112/75 | HR 74 | Resp 20 | Ht 64.0 in | Wt 128.0 lb

## 2021-09-13 DIAGNOSIS — Z951 Presence of aortocoronary bypass graft: Secondary | ICD-10-CM

## 2021-09-13 NOTE — Patient Instructions (Signed)
Hydrocortisone 1% twice daily to incisions

## 2021-09-13 NOTE — Progress Notes (Signed)
[address]       [place] [address]             [phone]      Cherice Lever [facility] Medical Record #[id number] Date of Birth: 12/14/1950  Referring: Leonie Man, MD Primary Care: Allwardt, Randa Evens, PA-C Primary Cardiologist: None   Chief Complaint:   POST OP FOLLOW UP Operative Report    DATE OF PROCEDURE: 07/28/2021     PREOPERATIVE DIAGNOSIS:  Critical left main disease.   POSTOPERATIVE DIAGNOSIS:  Critical left main disease.   PROCEDURE PERFORMED:   Median sternotomy, extracorporeal circulation, Coronary artery bypass grafting x 2  Left internal mammary artery to LAD,  Saphenous vein graft to obtuse marginal 1, Endoscopic vein harvest right thigh.   SURGEON:  Revonda Standard. Roxan Hockey, MD   ASSISTANT:  Ellwood Handler, PA   ANESTHESIA:  General. History of Present Illness:    The patient is a 70 year old female seen in the office on today's date for recent complaint of sternal incision discomfort.  She primarily describes a pins-and-needles type sensation with some itching.  The incisions have had no drainage or erythema.  The symptoms began approximately 7 to 10 days ago and prior to that had not been a problem.  She has not had any fevers, chills or other significant constitutional symptoms.  She has not tried any over-the-counter medications.  She denies any bone mobility and is not having any clicking/popping sensation.  In terms of her overall recovery she has been continuing to make good overall progress.  Chronic sleep difficulty and anxiety continues to be an issue.      Past Medical History:  Diagnosis Date   Anxiety    Endometriosis    Fibrocystic breast    muliple drainage   Fibroid    Hiatal hernia    History of bronchitis    Hypercholesteremia 2007   currently not taking anything for it   Osteopenia 08/2016   hip and spine   Subclinical hypothyroidism 10/04/2018   Urge incontinence 06/05/2013     Social History    Tobacco Use  Smoking Status Former   Packs/day: 1.00   Years: 20.00   Pack years: 20.00   Types: Cigarettes  Smokeless Tobacco Never  Tobacco Comments   quit in approximately 2000    Social History   Substance and Sexual Activity  Alcohol Use Yes   Alcohol/week: 3.0 - 4.0 standard drinks   Types: 2 Cans of beer, 1 - 2 Standard drinks or equivalent per week   Comment: rarely     Allergies  Allergen Reactions   Cephalosporins Anaphylaxis and Swelling    Throat swells   Clindamycin/Lincomycin     Metallic taste, blisters behind ears   Codeine Itching    Current Outpatient Medications  Medication Sig Dispense Refill   albuterol (VENTOLIN HFA) 108 (90 Base) MCG/ACT inhaler Inhale into the lungs every 6 (six) hours as needed for wheezing or shortness of breath.     aspirin EC 81 MG EC tablet Take 1 tablet (81 mg total) by mouth daily. Swallow whole. 30 tablet 11   atorvastatin (LIPITOR) 40 MG tablet Take 1 tablet (40 mg total) by mouth daily. 90 tablet 3   Calcium-Phosphorus-Vitamin D (CITRACAL +D3 PO) Take by mouth daily.     clopidogrel (PLAVIX) 75 MG tablet Take 1 tablet (75 mg total) by mouth daily. 90 tablet 3   Dextrose, Diabetic Use, (GLUCOSE PO) Take  1 tablet by mouth daily as needed. When feels like sugar is low     FLUoxetine (PROZAC) 40 MG capsule TAKE ONE CAPSULE BY MOUTH EVERY DAY 90 capsule 3   lisinopril (ZESTRIL) 5 MG tablet Take 1 tablet (5 mg total) by mouth daily. 90 tablet 3   metoprolol succinate (TOPROL-XL) 25 MG 24 hr tablet Take 0.5 tablets (12.5 mg total) by mouth daily. 60 tablet 1   pantoprazole (PROTONIX) 40 MG tablet Take 1 tablet (40 mg total) by mouth daily before breakfast. 30 tablet 2   acetaminophen (TYLENOL) 500 MG tablet Take 1,000 mg by mouth every 6 (six) hours as needed for moderate pain or headache.     diphenhydrAMINE (BENADRYL) 25 MG tablet Take 25 mg by mouth at bedtime.     ferrous sulfate 325 (65 FE) MG tablet Take 1 tablet (325 mg  total) by mouth daily with breakfast. For one month then stop.  3   guaiFENesin (MUCINEX) 600 MG 12 hr tablet Take 1 tablet (600 mg total) by mouth 2 (two) times daily as needed for to loosen phlegm or cough.     Melatonin 5 MG CAPS Take by mouth.     nitroGLYCERIN (NITROSTAT) 0.4 MG SL tablet Place 1 tablet (0.4 mg total) under the tongue every 5 (five) minutes as needed for up to 25 days for chest pain. 25 tablet 3   No current facility-administered medications for this visit.       Physical Exam: BP 112/75 (BP Location: Left Arm, Patient Position: Sitting)    Pulse 74    Resp 20    Ht 5\' 4"  (1.626 m)    Wt 128 lb (58.1 kg)    LMP 09/27/2003    SpO2 100% Comment: RA   BMI 21.97 kg/m   Wound: Incisions are well-healed without evidence of infection.  There is no erythema or drainage.  The sternum itself is stable to palpation with no click.   Diagnostic Studies & Laboratory data:     Recent Radiology Findings:   No results found.    Recent Lab Findings: Lab Results  Component Value Date   WBC 5.6 09/07/2021   HGB 12.0 09/07/2021   HCT 37.2 09/07/2021   PLT 404.0 (H) 09/07/2021   GLUCOSE 92 09/07/2021   CHOL 236 (H) 07/28/2021   TRIG 144 07/28/2021   HDL 49 07/28/2021   LDLCALC 158 (H) 07/28/2021   ALT 16 09/07/2021   AST 17 09/07/2021   NA 140 09/07/2021   K 4.7 09/07/2021   CL 104 09/07/2021   CREATININE 0.84 09/07/2021   BUN 11 09/07/2021   CO2 28 09/07/2021   TSH 3.643 07/27/2021   INR 1.7 (H) 07/28/2021   HGBA1C 5.2 07/28/2021      Assessment / Plan: Minor neuropathic discomfort of her incisions.  This does include also her EVH site.  There is no evidence of infection.  The incisions themselves appear well-healed.  Plan: We will try 1% hydrocortisone cream twice daily for the next 7 to 10 days to see if it relieves any of her pruritus/neuropathic symptoms it could be a suture reaction.  She does have several allergies.  I do not think it is appropriate at this  time to start her on Neurontin as this appears to be somewhat acute.  I told her she can contact the office as needed but to give it some time to see if this works.      Medication Changes: No orders  of the defined types were placed in this encounter.     John Giovanni, PA-C  09/13/2021 2:10 PM

## 2021-09-16 ENCOUNTER — Ambulatory Visit: Payer: Medicare Other | Admitting: Cardiology

## 2021-09-16 ENCOUNTER — Other Ambulatory Visit: Payer: Self-pay

## 2021-09-16 ENCOUNTER — Encounter: Payer: Self-pay | Admitting: Cardiology

## 2021-09-16 VITALS — BP 109/62 | HR 71 | Temp 97.7°F | Resp 16 | Ht 64.0 in | Wt 133.0 lb

## 2021-09-16 DIAGNOSIS — I2510 Atherosclerotic heart disease of native coronary artery without angina pectoris: Secondary | ICD-10-CM

## 2021-09-16 DIAGNOSIS — R0602 Shortness of breath: Secondary | ICD-10-CM

## 2021-09-16 DIAGNOSIS — Z951 Presence of aortocoronary bypass graft: Secondary | ICD-10-CM

## 2021-09-16 DIAGNOSIS — E7849 Other hyperlipidemia: Secondary | ICD-10-CM

## 2021-09-16 NOTE — Progress Notes (Signed)
Primary Physician/Referring:  Allwardt, Randa Evens, PA-C  Patient ID: Marissa Lee, female    DOB: 1951/01/28, 70 y.o.   MRN: [id number]  No chief complaint on file.  HPI:    Marissa Lee  is a 70 y.o. Caucasian female patient with no significant prior cardiovascular history, hypercholesterolemia presented on 07/27/2021 with unstable angina with substernal chest pain with radiation to the back, arm and jaw.  Underwent cardiac catheterization following morning revealed 90% ostial left main and mid left main disease for which he underwent emergent CABG on 07/28/2021 with LIMA to LAD and SVG to OM1.I had seen her 4 weeks ago, she now presents for follow-up.  She has recuperated well from surgery now, she was already set up for cardiac rehab which she will be starting sometime in first week of January 2023.  Overall states that she is doing well, dyspnea is improving and tolerating all her medications well. GERD improved on Protonix. .  Past Medical History:  Diagnosis Date   Anxiety    Endometriosis    Fibrocystic breast    muliple drainage   Fibroid    Hiatal hernia    History of bronchitis    Hypercholesteremia 2007   currently not taking anything for it   Osteopenia 08/2016   hip and spine   Subclinical hypothyroidism 10/04/2018   Urge incontinence 06/05/2013   Past Surgical History:  Procedure Laterality Date   ABDOMINAL HYSTERECTOMY     BLADDER SUSPENSION  2004   done with [place]   done 3x   CLOSED REDUCTION METACARPAL WITH PERCUTANEOUS PINNING Right 08/24/2016   Procedure: CLOSED REDUCTION  WITH PERCUTANEOUS PINNING;  Surgeon: Iran Planas, MD;  Location: [facility];  Service: Orthopedics;  Laterality: Right;   COLONOSCOPY     COMBINED HYSTERECTOMY VAGINAL / OOPHORECTOMY / A&P REPAIR  2004   CORONARY ARTERY BYPASS GRAFT N/A 07/28/2021   Procedure: CORONARY ARTERY BYPASS GRAFTING (CABG) times two using left internal mammary artery and  left leg saphenous vein;   Surgeon: [name], MD;  Location: [facility];  Service: Open Heart Surgery;  Laterality: N/A;   FOOT SURGERY Right 1992   IABP INSERTION Right 07/28/2021   Procedure: IABP Insertion;  Surgeon: Leonie Man, MD;  Location: [facility];  Service: Cardiovascular;  Laterality: Right;   LEFT HEART CATH AND CORONARY ANGIOGRAPHY N/A 07/28/2021   Procedure: LEFT HEART CATH AND CORONARY ANGIOGRAPHY;  Surgeon: Leonie Man, MD;  Location: [facility];  Service: Cardiovascular;  Laterality: N/A;   OPEN REDUCTION INTERNAL FIXATION (ORIF) HAND Right 08/24/2016   Procedure: RIGHT HAND LONG RING AND SMALL FINGER  POSSIBLE ORIF;  Surgeon: Iran Planas, MD;  Location: [facility];  Service: Orthopedics;  Laterality: Right;   WISDOM TOOTH EXTRACTION  1978   Family History  Problem Relation Age of Onset   Cancer Paternal Grandmother        Colon    Cancer Paternal Grandfather        Lung   Heart disease Mother        valvular disease, valve replacement   Atrial fibrillation Mother    Stroke Mother    Heart disease Brother        valve disease, valve surgery   Thyroid disease Maternal Grandmother        Goiter   Thyroid disease Daughter        Hypothyroid    Social History  Tobacco Use   Smoking status: Former    Packs/day: 1.00    Years: 20.00    Pack years: 20.00    Types: Cigarettes    Quit date: 2000    Years since quitting: 22.9   Smokeless tobacco: Never   Tobacco comments:    quit in approximately 2000  Substance Use Topics   Alcohol use: Yes    Alcohol/week: 3.0 - 4.0 standard drinks    Types: 2 Cans of beer, 1 - 2 Standard drinks or equivalent per week    Comment: rarely   Marital Status: Single  ROS  Review of Systems  Cardiovascular:  Positive for dyspnea on exertion. Negative for chest pain and leg swelling.  Gastrointestinal:  Negative for melena.  Objective  Blood pressure 109/62, pulse 71, temperature 97.7 F (36.5 C), temperature source  Temporal, resp. rate 16, height 5' 4" (1.626 m), weight 133 lb (60.3 kg), last menstrual period 09/27/2003, SpO2 98 %. Body mass index is 22.83 kg/m.  Vitals with BMI 09/16/2021 09/13/2021 09/07/2021  Height 5' 4" 5' 4" 5' 4"  Weight 133 lbs 128 lbs 131 lbs 3 oz  BMI 22.82 17.51 02.58  Systolic 527 782 423  Diastolic 62 75 60  Pulse 71 74 70     Physical Exam Neck:     Vascular: No carotid bruit or JVD.  Cardiovascular:     Rate and Rhythm: Normal rate and regular rhythm.     Pulses: Intact distal pulses.     Heart sounds: Normal heart sounds. No murmur heard.   No gallop.  Pulmonary:     Effort: Pulmonary effort is normal.     Breath sounds: Normal breath sounds.  Chest:     Comments: Sternotomy site has healed well. Abdominal:     General: Bowel sounds are normal.     Palpations: Abdomen is soft.  Musculoskeletal:        General: No swelling.     Laboratory examination:   Recent Labs    07/30/21 0356 07/31/21 0313 08/03/21 0309 09/07/21 1032  NA 136 135 140 140  K 3.7 5.0 4.0 4.7  CL 108 107 107 104  CO2 _0 GLUCOSE 119* 102* 107* 92  BUN 7* _1 CREATININE 0.79 0.76 0.73 0.84  CALCIUM 8.1* 8.2* 8.5* 9.6  GFRNONAA >60 >60 >60  --    estimated creatinine clearance is 54.6 mL/min (by C-G formula based on SCr of 0.84 mg/dL).  CMP Latest Ref Rng & Units 09/07/2021 08/03/2021 07/31/2021  Glucose 70 - 99 mg/dL 92 107(H) 102(H)  BUN 6 - 23 mg/dL _2 Creatinine 0.40 - 1.20 mg/dL 0.84 0.73 0.76  Sodium 135 - 145 mEq/L 140 140 135  Potassium 3.5 - 5.1 mEq/L 4.7 4.0 5.0  Chloride 96 - 112 mEq/L 104 107 107  CO2 19 - 32 mEq/L _3 Calcium 8.4 - 10.5 mg/dL 9.6 8.5(L) 8.2(L)  Total Protein 6.0 - 8.3 g/dL 7.1 - -  Total Bilirubin 0.2 - 1.2 mg/dL 0.5 - -  Alkaline Phos 39 - 117 U/L 62 - -  AST 0 - 37 U/L 17 - -  ALT 0 - 35 U/L 16 - -   CBC Latest Ref Rng & Units 09/07/2021 08/03/2021 07/31/2021  WBC 4.0 - 10.5 K/uL 5.6 7.3 9.9  Hemoglobin 12.0 -  15.0 g/dL 12.0 7.9(L) 8.7(L)  Hematocrit 36.0 - 46.0 % 37.2 24.5(L) 26.8(L)  Platelets 150.0 -  400.0 K/uL 404.0(H) 327 157    Lipid Panel Recent Labs    07/28/21 0238  CHOL 236*  TRIG 144  LDLCALC 158*  VLDL 29  HDL 49  CHOLHDL 4.8  Lipid profile 10/04/2018: Total cholesterol 398, triglycerides 171, HDL 55, LDL 218.  Lipid profile 10/30/2019: Total cholesterol 245, triglycerides 187, HDL 53, LDL 154.  HEMOGLOBIN A1C Lab Results  Component Value Date   HGBA1C 5.2 07/28/2021   MPG 102.54 07/28/2021   TSH Recent Labs    07/27/21 1948  TSH 3.643   Medications and allergies   Allergies  Allergen Reactions   Cephalosporins Anaphylaxis and Swelling    Throat swells   Clindamycin/Lincomycin     Metallic taste, blisters behind ears   Codeine Itching     Medication prior to this encounter:   Outpatient Medications Prior to Visit  Medication Sig Dispense Refill   aspirin EC 81 MG EC tablet Take 1 tablet (81 mg total) by mouth daily. Swallow whole. 30 tablet 11   atorvastatin (LIPITOR) 40 MG tablet Take 1 tablet (40 mg total) by mouth daily. 90 tablet 3   Calcium-Phosphorus-Vitamin D (CITRACAL +D3 PO) Take by mouth daily.     clopidogrel (PLAVIX) 75 MG tablet Take 1 tablet (75 mg total) by mouth daily. 90 tablet 3   Dextrose, Diabetic Use, (GLUCOSE PO) Take 1 tablet by mouth daily as needed. When feels like sugar is low     FLUoxetine (PROZAC) 40 MG capsule TAKE ONE CAPSULE BY MOUTH EVERY DAY 90 capsule 3   lisinopril (ZESTRIL) 5 MG tablet Take 1 tablet (5 mg total) by mouth daily. 90 tablet 3   metoprolol succinate (TOPROL-XL) 25 MG 24 hr tablet Take 0.5 tablets (12.5 mg total) by mouth daily. 60 tablet 1   nitroGLYCERIN (NITROSTAT) 0.4 MG SL tablet Place 1 tablet (0.4 mg total) under the tongue every 5 (five) minutes as needed for up to 25 days for chest pain. 25 tablet 3   pantoprazole (PROTONIX) 40 MG tablet Take 1 tablet (40 mg total) by mouth daily before breakfast. 30  tablet 2   acetaminophen (TYLENOL) 500 MG tablet Take 1,000 mg by mouth every 6 (six) hours as needed for moderate pain or headache.     albuterol (VENTOLIN HFA) 108 (90 Base) MCG/ACT inhaler Inhale into the lungs every 6 (six) hours as needed for wheezing or shortness of breath.     diphenhydrAMINE (BENADRYL) 25 MG tablet Take 25 mg by mouth at bedtime.     ferrous sulfate 325 (65 FE) MG tablet Take 1 tablet (325 mg total) by mouth daily with breakfast. For one month then stop.  3   guaiFENesin (MUCINEX) 600 MG 12 hr tablet Take 1 tablet (600 mg total) by mouth 2 (two) times daily as needed for to loosen phlegm or cough.     Melatonin 5 MG CAPS Take by mouth.     No facility-administered medications prior to visit.    Medication list after today's encounter   Current Outpatient Medications  Medication Instructions   aspirin 81 mg, Oral, Daily, Swallow whole.   atorvastatin (LIPITOR) 40 mg, Oral, Daily   Calcium-Phosphorus-Vitamin D (CITRACAL +D3 PO) Oral, Daily   clopidogrel (PLAVIX) 75 mg, Oral, Daily   Dextrose, Diabetic Use, (GLUCOSE PO) 1 tablet, Oral, Daily PRN, When feels like sugar is low    FLUoxetine (PROZAC) 40 MG capsule TAKE ONE CAPSULE BY MOUTH EVERY DAY   lisinopril (ZESTRIL) 5 mg, Oral, Daily  metoprolol succinate (TOPROL-XL) 12.5 mg, Oral, Daily   nitroGLYCERIN (NITROSTAT) 0.4 mg, Sublingual, Every 5 min PRN   pantoprazole (PROTONIX) 40 mg, Oral, Daily before breakfast   Radiology:   Chest x-ray 08/05/2021: Unchanged cardiomediastinal silhouette. Prior median sternotomy and CABG. There are small bilateral pleural effusions and bibasilar opacities, likely atelectasis. Obliquely oriented bandlike opacity in the right lung, similar to prior exam, likely scarring or atelectasis. No visible pneumothorax. No acute osseous abnormality.  Cardiac Studies:   CABG for critical left main disease 07/28/2021: LIMA to LAD and SVG to OM1  Coronary angiogram  07/28/2021: 1. Severe Left Main CAD-ostial 90% with catheter damping and no resolution with IC nitro ? Excellent LAD, diagonal and LCx targets 2. Angiographically normal Right Coronary Artery with RPDA and PL system. 3. Normal/preserved LVEF with normal EDP. 4. Successful placement of 50 cc IABP pump via RFA 8 French sheath  Echocardiogram 07/28/2021:  1. Left ventricular ejection fraction, by estimation, is 60 to 65%. The left ventricle has normal function. The left ventricle has no regional wall motion abnormalities. Left ventricular diastolic parameters are indeterminate.  2. Right ventricular systolic function is normal. The right ventricular size is normal. There is normal pulmonary artery systolic pressure.  3. Left atrial size was mildly dilated.  EKG:   EKG 08/12/2021: Normal sinus rhythm at rate of 87 bpm, left axis deviation, left anterior fascicular block.  Incomplete right bundle branch block.  Diffuse nonspecific T abnormality.  Cannot exclude anterolateral ischemia. Compared to EKG 07/29/2021, anterolateral T wave inversion is new, probably suggest post CABG changes. Assessment     ICD-10-CM   1. Coronary artery disease involving native coronary artery of native heart without angina pectoris  I25.10 High sensitivity CRP    CMP14+EGFR    CBC    2. 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 Familial hyperlipidemia  E78.49 Lipid Panel With LDL/HDL Ratio    4. SOB (shortness of breath)  R06.02        Medications Discontinued During This Encounter  Medication Reason   guaiFENesin (MUCINEX) 600 MG 12 hr tablet    Melatonin 5 MG CAPS    diphenhydrAMINE (BENADRYL) 25 MG tablet    ferrous sulfate 325 (65 FE) MG tablet    acetaminophen (TYLENOL) 500 MG tablet    albuterol (VENTOLIN HFA) 108 (90 Base) MCG/ACT inhaler     No orders of the defined types were placed in this encounter.   Orders Placed This Encounter  Procedures    Lipid Panel With LDL/HDL Ratio   High sensitivity CRP   CMP14+EGFR   CBC   Recommendations:   Marissa Lee is a 70 y.o. Caucasian female patient with no significant prior cardiovascular history, hypercholesterolemia presented on 07/27/2021 with unstable angina with substernal chest pain with radiation to the back, arm and jaw.  Underwent cardiac catheterization following morning revealed 90% ostial left main and mid left main disease for which he underwent emergent CABG on 07/28/2021 with LIMA to LAD and SVG to OM1.  I had seen her 4 weeks ago, she now presents for follow-up.  She has recuperated well from surgery now, she was already set up for cardiac rehab which she will be starting sometime in first week of January 2023.  Her blood pressure is very soft, she is on very low-dose of ACE inhibitor and also beta-blocker therapy, will continue the same.  Heart rate  is well controlled.  Her exam today is unremarkable.  With regard to hyperlipidemia, will repeat lipid profile testing along with CRP, CMP and CBC will also be obtained as she is postop from baseline.  Depending upon lipids, I will make changes to her Lipitor, presently on 40 mg.  Suspect she will need 80 mg plus the addition of Zetia as goal LDL is <55.  I would like to see her back in 2 months for follow-up.  Her dyspnea is stable and is probably related to postop changes.  Lungs are essentially unremarkable, no clinical evidence of heart failure.  Gerd improved on Protonix.     Adrian Prows, MD, [facility] 09/16/2021, 11:45 AM Office: [phone]

## 2021-09-23 ENCOUNTER — Telehealth (HOSPITAL_COMMUNITY): Payer: Self-pay

## 2021-09-28 ENCOUNTER — Encounter (HOSPITAL_COMMUNITY)
Admission: RE | Admit: 2021-09-28 | Discharge: 2021-09-28 | Disposition: A | Discharge status: Home / Self Care | Payer: Medicare Other | Source: Ambulatory Visit | Attending: Cardiology | Admitting: Cardiology

## 2021-09-28 ENCOUNTER — Other Ambulatory Visit: Payer: Self-pay

## 2021-09-28 VITALS — BP 112/60 | HR 72 | Ht 64.0 in | Wt 131.8 lb

## 2021-09-28 DIAGNOSIS — Z951 Presence of aortocoronary bypass graft: Secondary | ICD-10-CM | POA: Insufficient documentation

## 2021-09-28 NOTE — Progress Notes (Signed)
Cardiac Rehab Medication Review  Does the patient  feel that his/her medications are working for him/her?  yes  Has the patient been experiencing any side effects to the medications prescribed?  no  Does the patient measure his/her own blood pressure or blood glucose at home?  yes   Does the patient have any problems obtaining medications due to transportation or finances?   yes  Understanding of regimen: excellent Understanding of indications: excellent Potential of compliance: excellent        Chayanne Filippi Ruben Im RN 09/28/2021 3:16 PM

## 2021-09-28 NOTE — Progress Notes (Signed)
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 Swallow whole., Disp: 30 tablet, Rfl: 11   atorvastatin (LIPITOR) 40 MG tablet, Take 1 tablet (40 mg total) by mouth daily., Disp: 90 tablet, Rfl: 3   Calcium-Phosphorus-Vitamin D (CITRACAL +D3 PO), Take 1 tablet by mouth daily., Disp: , Rfl:    clopidogrel (PLAVIX) 75 MG tablet, Take 1 tablet (75 mg total) by mouth daily., Disp: 90 tablet, Rfl: 3   Dextrose, Diabetic Use, (GLUCOSE PO), Take 1 tablet by mouth daily as needed (hypoglycemic episodes)., Disp: , Rfl:    FLUoxetine (PROZAC) 40 MG capsule, TAKE ONE CAPSULE BY MOUTH EVERY DAY, Disp: 90 capsule, Rfl: 3   lisinopril (ZESTRIL) 5 MG tablet, Take 1 tablet (5 mg total) by mouth daily., Disp: 90 tablet, Rfl: 3   metoprolol succinate (TOPROL-XL) 25 MG 24 hr tablet, Take 0.5 tablets (12.5 mg total) by mouth daily., Disp: 60 tablet, Rfl: 1   pantoprazole (PROTONIX) 40 MG tablet, Take 1 tablet (40 mg total) by mouth daily before breakfast., Disp: 30 tablet, Rfl: 2   nitroGLYCERIN (NITROSTAT) 0.4 MG SL tablet, Place 1 tablet (0.4 mg total) under the tongue every 5 (five) minutes as needed for up to 25 days for chest pain. (Patient not taking: Reported on 09/28/2021), Disp: 25 tablet, Rfl: 3  Past Medical History: Past Medical History:  Diagnosis Date   Anxiety    Endometriosis    Fibrocystic breast    muliple drainage    Fibroid    Hiatal hernia    History of bronchitis    Hypercholesteremia 2007   currently not taking anything for it   Osteopenia 08/2016   hip and spine   Subclinical hypothyroidism 10/04/2018   Urge incontinence 06/05/2013    Tobacco Use: Social History   Tobacco Use  Smoking Status Former   Packs/day: 1.00   Years: 20.00   Pack years: 20.00   Types: Cigarettes   Quit date: 2000   Years since quitting: 23.0  Smokeless Tobacco Never  Tobacco Comments   quit in approximately 2000    Labs: Recent Review Flowsheet Data     Labs for ITP Cardiac and Pulmonary Rehab Latest Ref Rng & Units 07/28/2021 07/29/2021 07/29/2021 07/29/2021 07/29/2021   Cholestrol 0 - 200 mg/dL - - - - -   LDLCALC 0 - 99 mg/dL - - - - -   HDL >40 mg/dL - - - - -   Trlycerides <150 mg/dL - - - - -   Hemoglobin A1c 4.8 - 5.6 % - - - - -   PHART 7.350 - 7.450 7.431 7.240(L) 7.250(L) 7.269(L) 7.309(L)   PCO2ART 32.0 - 48.0 mmHg 33.4 50.6(H) 51.8(H) 47.1 48.8(H)   HCO3 20.0 - 28.0 mmol/L 22.5 21.6 22.7 21.5 24.5   TCO2 22 - 32 mmol/L 24 23 24 23 26    ACIDBASEDEF 0.0 - 2.0 mmol/L 2.0 5.0(H)  4.0(H) 5.0(H) 2.0   O2SAT % 100.0 98.0 97.0 99.0 98.0       Capillary Blood Glucose: Lab Results  Component Value Date   GLUCAP 95 07/31/2021   GLUCAP 89 07/31/2021   GLUCAP 109 (H) 07/31/2021   GLUCAP 114 (H) 07/31/2021   GLUCAP 85 07/30/2021     Exercise Target Goals: Exercise Program Goal: Individual exercise prescription set using results from initial 6 min walk test and THRR while considering  patients activity barriers and safety.   Exercise Prescription Goal: Initial exercise prescription builds to 30-45 minutes a day of aerobic activity, 2-3 days per week.  Home exercise guidelines will be given to patient during program as part of exercise prescription that the participant will acknowledge.  Activity Barriers & Risk Stratification:  Activity Barriers & Cardiac Risk Stratification - 09/28/21 1157        Activity Barriers & Cardiac Risk Stratification   Activity Barriers Other (comment);Back Problems    Comments Three broken fingers on right hand in 2017 affects grip.    Cardiac Risk Stratification High             6 Minute Walk:  6 Minute Walk     Row Name 09/28/21 1213         6 Minute Walk   Phase Initial     Distance 1340 feet     Walk Time 6 minutes     # of Rest Breaks 0     MPH 2.54     METS 3.01     RPE 13     Perceived Dyspnea  1.5     VO2 Peak 10.55     Symptoms Yes (comment)     Comments Mild SOB     Resting HR 72 bpm     Resting BP 112/60     Resting Oxygen Saturation  100 %     Exercise Oxygen Saturation  during 6 min walk 100 %     Max Ex. HR 87 bpm     Max Ex. BP 118/62     2 Minute Post BP 122/72              Oxygen Initial Assessment:   Oxygen Re-Evaluation:   Oxygen Discharge (Final Oxygen Re-Evaluation):   Initial Exercise Prescription:  Initial Exercise Prescription - 09/28/21 1200       Date of Initial Exercise RX and Referring Provider   Date 09/28/21    Referring Provider Adrian Prows, MD    Expected Discharge Date 11/26/21      Recumbant Bike   Level 1.5    Minutes 15    METs 2.5      NuStep   Level 2    SPM 85    Minutes 15    METs 2.5      Prescription Details   Frequency (times per week) 3    Duration Progress to 30 minutes of continuous aerobic without signs/symptoms of physical distress      Intensity   THRR 40-80% of Max Heartrate 60-121    Ratings of Perceived Exertion 11-13    Perceived Dyspnea 0-4      Progression   Progression Continue to progress workloads to maintain intensity without signs/symptoms of physical distress.      Resistance Training   Training Prescription Yes    Weight 2 lbs    Reps 10-15             Perform Capillary Blood Glucose checks  as needed.  Exercise Prescription Changes:   Exercise Comments:   Exercise Goals and Review:   Exercise Goals     Row  Name 09/28/21 1155             Exercise Goals   Increase Physical Activity Yes       Intervention Provide advice, education, support and counseling about physical activity/exercise needs.;Develop an individualized exercise prescription for aerobic and resistive training based on initial evaluation findings, risk stratification, comorbidities and participant's personal goals.       Expected Outcomes Short Term: Attend rehab on a regular basis to increase amount of physical activity.;Long Term: Exercising regularly at least 3-5 days a week.;Long Term: Add in home exercise to make exercise part of routine and to increase amount of physical activity.       Increase Strength and Stamina Yes       Intervention Provide advice, education, support and counseling about physical activity/exercise needs.;Develop an individualized exercise prescription for aerobic and resistive training based on initial evaluation findings, risk stratification, comorbidities and participant's personal goals.       Expected Outcomes Short Term: Increase workloads from initial exercise prescription for resistance, speed, and METs.;Short Term: Perform resistance training exercises routinely during rehab and add in resistance training at home;Long Term: Improve cardiorespiratory fitness, muscular endurance and strength as measured by increased METs and functional capacity (6MWT)       Able to understand and use rate of perceived exertion (RPE) scale Yes       Intervention Provide education and explanation on how to use RPE scale       Expected Outcomes Short Term: Able to use RPE daily in rehab to express subjective intensity level;Long Term:  Able to use RPE to guide intensity level when exercising independently       Knowledge and understanding of Target Heart Rate Range (THRR) Yes       Intervention Provide education and explanation of THRR including how the numbers were predicted and where they are located for reference        Expected Outcomes Short Term: Able to state/look up THRR;Long Term: Able to use THRR to govern intensity when exercising independently;Short Term: Able to use daily as guideline for intensity in rehab       Able to check pulse independently Yes       Intervention Provide education and demonstration on how to check pulse in carotid and radial arteries.;Review the importance of being able to check your own pulse for safety during independent exercise       Expected Outcomes Short Term: Able to explain why pulse checking is important during independent exercise;Long Term: Able to check pulse independently and accurately       Understanding of Exercise Prescription Yes       Intervention Provide education, explanation, and written materials on patient's individual exercise prescription       Expected Outcomes Short Term: Able to explain program exercise prescription;Long Term: Able to explain home exercise prescription to exercise independently                Exercise Goals Re-Evaluation :   Discharge Exercise Prescription (Final Exercise Prescription Changes):   Nutrition:  Target Goals: Understanding of nutrition guidelines, daily intake of sodium 1500mg , cholesterol 200mg , calories 30% from fat and 7% or less from saturated fats, daily to have 5 or more servings of fruits and vegetables.  Biometrics:  Pre Biometrics - 09/28/21 1054  Pre Biometrics   Waist Circumference 32 inches    Hip Circumference 37.25 inches    Waist to Hip Ratio 0.86 %    Triceps Skinfold 20 mm    % Body Fat 33.6 %    Grip Strength 16 kg    Flexibility 0 in    Single Leg Stand 23.81 seconds              Nutrition Therapy Plan and Nutrition Goals:   Nutrition Assessments:  MEDIFICTS Score Key: ?70 Need to make dietary changes  40-70 Heart Healthy Diet ? 40 Therapeutic Level Cholesterol Diet    Picture Your Plate Scores: <67 Unhealthy dietary pattern with much room for  improvement. 41-50 Dietary pattern unlikely to meet recommendations for good health and room for improvement. 51-60 More healthful dietary pattern, with some room for improvement.  >60 Healthy dietary pattern, although there may be some specific behaviors that could be improved.    Nutrition Goals Re-Evaluation:   Nutrition Goals Re-Evaluation:   Nutrition Goals Discharge (Final Nutrition Goals Re-Evaluation):   Psychosocial: Target Goals: Acknowledge presence or absence of significant depression and/or stress, maximize coping skills, provide positive support system. Participant is able to verbalize types and ability to use techniques and skills needed for reducing stress and depression.  Initial Review & Psychosocial Screening:  Initial Psych Review & Screening - 09/28/21 1506       Initial Review   Current issues with None Identified      Family Dynamics   Good Support System? Yes    Comments Lochlyn has a daughter and grandchildren (4) who live in [place].  Son who lives in [place].  Roderic Scarce supported and remarks that she is feeling "really good" and didn't really know how bad she was really feeling.  Katessa feels care free and doesn't let 'stuff" bother her      Barriers   Psychosocial barriers to participate in program There are no identifiable barriers or psychosocial needs.      Screening Interventions   Interventions Encouraged to exercise;Provide feedback about the scores to participant    Expected Outcomes Short Term goal: Identification and review with participant of any Quality of Life or Depression concerns found by scoring the questionnaire.;Long Term goal: The participant improves quality of Life and PHQ9 Scores as seen by post scores and/or verbalization of changes             Quality of Life Scores:  Quality of Life - 09/28/21 1407       Quality of Life   Select Quality of Life      Quality of Life Scores   Health/Function Pre 24 %     Socioeconomic Pre 27 %    Psych/Spiritual Pre 27.43 %    Family Pre 24 %    GLOBAL Pre 25.35 %            Scores of 19 and below usually indicate a poorer quality of life in these areas.  A difference of  2-3 points is a clinically meaningful difference.  A difference of 2-3 points in the total score of the Quality of Life Index has been associated with significant improvement in overall quality of life, self-image, physical symptoms, and general health in studies assessing change in quality of life.  PHQ-9: Recent Review Flowsheet Data     Depression screen [facility] 2/9 09/28/2021 10/30/2019 10/04/2018 09/29/2017 07/20/2015   Decreased Interest 0 0 0 0 1   Down, Depressed, Hopeless 0  0 0 0 0   PHQ - 2 Score 0 0 0 0 1   Altered sleeping 0 - - - -   Tired, decreased energy 0 - - - -   Change in appetite 0 - - - -   Feeling bad or failure about yourself  0 - - - -   Trouble concentrating 0 - - - -   Moving slowly or fidgety/restless 0 - - - -   Suicidal thoughts 0 - - - -   PHQ-9 Score 0 - - - -   Difficult doing work/chores Not difficult at all - - - -      Interpretation of Total Score  Total Score Depression Severity:  1-4 = Minimal depression, 5-9 = Mild depression, 10-14 = Moderate depression, 15-19 = Moderately severe depression, 20-27 = Severe depression   Psychosocial Evaluation and Intervention:   Psychosocial Re-Evaluation:   Psychosocial Discharge (Final Psychosocial Re-Evaluation):   Vocational Rehabilitation: Provide vocational rehab assistance to qualifying candidates.   Vocational Rehab Evaluation & Intervention:  Vocational Rehab - 09/28/21 1509       Initial Vocational Rehab Evaluation & Intervention   Assessment shows need for Vocational Rehabilitation No      Vocational Rehab Re-Evaulation   Comments Retired Pharmacist, hospital from [place]             Education: Education Goals: Education classes will be provided on a weekly basis, covering required topics.  Participant will state understanding/return demonstration of topics presented.  Learning Barriers/Preferences:  Learning Barriers/Preferences - 09/28/21 1509       Learning Barriers/Preferences   Learning Barriers Sight;Hearing    Learning Preferences Individual Instruction;Group Instruction;Written Material             Education Topics: Count Your Pulse:  -Group instruction provided by verbal instruction, demonstration, patient participation and written materials to support subject.  Instructors address importance of being able to find your pulse and how to count your pulse when at home without a heart monitor.  Patients get hands on experience counting their pulse with staff help and individually.   Heart Attack, Angina, and Risk Factor Modification:  -Group instruction provided by verbal instruction, video, and written materials to support subject.  Instructors address signs and symptoms of angina and heart attacks.    Also discuss risk factors for heart disease and how to make changes to improve heart health risk factors.   Functional Fitness:  -Group instruction provided by verbal instruction, demonstration, patient participation, and written materials to support subject.  Instructors address safety measures for doing things around the house.  Discuss how to get up and down off the floor, how to pick things up properly, how to safely get out of a chair without assistance, and balance training.   Meditation and Mindfulness:  -Group instruction provided by verbal instruction, patient participation, and written materials to support subject.  Instructor addresses importance of mindfulness and meditation practice to help reduce stress and improve awareness.  Instructor also leads participants through a meditation exercise.    Stretching for Flexibility and Mobility:  -Group instruction provided by verbal instruction, patient participation, and written materials to support subject.   Instructors lead participants through series of stretches that are designed to increase flexibility thus improving mobility.  These stretches are additional exercise for major muscle groups that are typically performed during regular warm up and cool down.   Hands Only CPR:  -Group verbal, video, and participation provides a basic overview of AHA  guidelines for community CPR. Role-play of emergencies allow participants the opportunity to practice calling for help and chest compression technique with discussion of AED use.   Hypertension: -Group verbal and written instruction that provides a basic overview of hypertension including the most recent diagnostic guidelines, risk factor reduction with self-care instructions and medication management.    Nutrition I class: Heart Healthy Eating:  -Group instruction provided by PowerPoint slides, verbal discussion, and written materials to support subject matter. The instructor gives an explanation and review of the Therapeutic Lifestyle Changes diet recommendations, which includes a discussion on lipid goals, dietary fat, sodium, fiber, plant stanol/sterol esters, sugar, and the components of a well-balanced, healthy diet.   Nutrition II class: Lifestyle Skills:  -Group instruction provided by PowerPoint slides, verbal discussion, and written materials to support subject matter. The instructor gives an explanation and review of label reading, grocery shopping for heart health, heart healthy recipe modifications, and ways to make healthier choices when eating out.   Diabetes Question & Answer:  -Group instruction provided by PowerPoint slides, verbal discussion, and written materials to support subject matter. The instructor gives an explanation and review of diabetes co-morbidities, pre- and post-prandial blood glucose goals, pre-exercise blood glucose goals, signs, symptoms, and treatment of hypoglycemia and hyperglycemia, and foot care  basics.   Diabetes Blitz:  -Group instruction provided by PowerPoint slides, verbal discussion, and written materials to support subject matter. The instructor gives an explanation and review of the physiology behind type 1 and type 2 diabetes, diabetes medications and rational behind using different medications, pre- and post-prandial blood glucose recommendations and Hemoglobin A1c goals, diabetes diet, and exercise including blood glucose guidelines for exercising safely.    Portion Distortion:  -Group instruction provided by PowerPoint slides, verbal discussion, written materials, and food models to support subject matter. The instructor gives an explanation of serving size versus portion size, changes in portions sizes over the last 20 years, and what consists of a serving from each food group.   Stress Management:  -Group instruction provided by verbal instruction, video, and written materials to support subject matter.  Instructors review role of stress in heart disease and how to cope with stress positively.     Exercising on Your Own:  -Group instruction provided by verbal instruction, power point, and written materials to support subject.  Instructors discuss benefits of exercise, components of exercise, frequency and intensity of exercise, and end points for exercise.  Also discuss use of nitroglycerin and activating EMS.  Review options of places to exercise outside of rehab.  Review guidelines for sex with heart disease.   Cardiac Drugs I:  -Group instruction provided by verbal instruction and written materials to support subject.  Instructor reviews cardiac drug classes: antiplatelets, anticoagulants, beta blockers, and statins.  Instructor discusses reasons, side effects, and lifestyle considerations for each drug class.   Cardiac Drugs II:  -Group instruction provided by verbal instruction and written materials to support subject.  Instructor reviews cardiac drug classes:  angiotensin converting enzyme inhibitors (ACE-I), angiotensin II receptor blockers (ARBs), nitrates, and calcium channel blockers.  Instructor discusses reasons, side effects, and lifestyle considerations for each drug class.   Anatomy and Physiology of the Circulatory System:  Group verbal and written instruction and models provide basic cardiac anatomy and physiology, with the coronary electrical and arterial systems. Review of: AMI, Angina, Valve disease, Heart Failure, Peripheral Artery Disease, Cardiac Arrhythmia, Pacemakers, and the ICD.   Other Education:  -Group or individual verbal, written, or  video instructions that support the educational goals of the cardiac rehab program.   Holiday Eating Survival Tips:  -Group instruction provided by PowerPoint slides, verbal discussion, and written materials to support subject matter. The instructor gives patients tips, tricks, and techniques to help them not only survive but enjoy the holidays despite the onslaught of food that accompanies the holidays.   Knowledge Questionnaire Score:  Knowledge Questionnaire Score - 09/28/21 1407       Knowledge Questionnaire Score   Pre Score 21/24             Core Components/Risk Factors/Patient Goals at Admission:  Personal Goals and Risk Factors at Admission - 09/28/21 1057       Core Components/Risk Factors/Patient Goals on Admission   Lipids Yes    Intervention Provide education and support for participant on nutrition & aerobic/resistive exercise along with prescribed medications to achieve LDL 70mg , HDL >40mg .    Expected Outcomes Short Term: Participant states understanding of desired cholesterol values and is compliant with medications prescribed. Participant is following exercise prescription and nutrition guidelines.;Long Term: Cholesterol controlled with medications as prescribed, with individualized exercise RX and with personalized nutrition plan. Value goals: LDL < 70mg , HDL > 40  mg.             Core Components/Risk Factors/Patient Goals Review:    Core Components/Risk Factors/Patient Goals at Discharge (Final Review):    ITP Comments:  ITP Comments     Row Name 09/28/21 1054           ITP Comments Medical Director- Dr. Fransico Him, MD                Comments: Patient attended orientation for the cardiac rehabilitation program on 09/28/2021 to review rules and guidelines for the program. Completed 6-minute walk test, Initial ITP, and exercise prescription. Vital signs stable. Telemetry- Normal sinus rhythm with T wave inversion, consistent with 12-lead EKG from 08/12/2021, asymptomatic. Safety measures and social distancing in place per CDC guidelines.

## 2021-10-04 ENCOUNTER — Other Ambulatory Visit: Payer: Self-pay

## 2021-10-04 ENCOUNTER — Encounter (HOSPITAL_COMMUNITY)
Admission: RE | Admit: 2021-10-04 | Discharge: 2021-10-04 | Disposition: A | Discharge status: Home / Self Care | Payer: Medicare Other | Source: Ambulatory Visit | Attending: Cardiology | Admitting: Cardiology

## 2021-10-04 DIAGNOSIS — Z951 Presence of aortocoronary bypass graft: Secondary | ICD-10-CM

## 2021-10-06 ENCOUNTER — Other Ambulatory Visit: Payer: Self-pay

## 2021-10-06 ENCOUNTER — Encounter (HOSPITAL_COMMUNITY)
Admission: RE | Admit: 2021-10-06 | Discharge: 2021-10-06 | Disposition: A | Discharge status: Home / Self Care | Payer: Medicare Other | Source: Ambulatory Visit | Attending: Cardiology | Admitting: Cardiology

## 2021-10-06 DIAGNOSIS — Z951 Presence of aortocoronary bypass graft: Secondary | ICD-10-CM | POA: Diagnosis not present

## 2021-10-08 ENCOUNTER — Encounter (HOSPITAL_COMMUNITY): Payer: Medicare Other

## 2021-10-11 ENCOUNTER — Other Ambulatory Visit: Payer: Self-pay

## 2021-10-11 ENCOUNTER — Encounter (HOSPITAL_COMMUNITY)
Admission: RE | Admit: 2021-10-11 | Discharge: 2021-10-11 | Disposition: A | Discharge status: Home / Self Care | Payer: Medicare Other | Source: Ambulatory Visit | Attending: Cardiology | Admitting: Cardiology

## 2021-10-11 DIAGNOSIS — Z951 Presence of aortocoronary bypass graft: Secondary | ICD-10-CM | POA: Diagnosis not present

## 2021-10-11 NOTE — Progress Notes (Signed)
Daily Session Note  Patient Details  Name: Marissa Lee MRN: [id number] Date of Birth: [birth year] Referring Provider:   Flowsheet Row CARDIAC REHAB PHASE II ORIENTATION from 09/28/2021 in [place]  Referring Provider Adrian Prows, MD       Encounter Date: 10/06/2021  Check In:   Capillary Blood Glucose: No results found for this or any previous visit (from the past 24 hour(s)).    Social History   Tobacco Use  Smoking Status Former   Packs/day: 1.00   Years: 20.00   Pack years: 20.00   Types: Cigarettes   Quit date: 2000   Years since quitting: 23.0  Smokeless Tobacco Never  Tobacco Comments   quit in approximately 2000    Goals Met:  Exercise tolerated well No report of concerns or symptoms today  Goals Unmet:  Not Applicable  Comments: Pt started cardiac rehab today.  Pt tolerated light exercise without difficulty. VSS, telemetry-Sinus Rhythm, asymptomatic.  Medication list reconciled. Pt denies barriers to medicaiton compliance.  PSYCHOSOCIAL ASSESSMENT:  PHQ-0. Pt exhibits positive coping skills, hopeful outlook with supportive family. No psychosocial needs identified at this time, no psychosocial interventions necessary.    Pt enjoys playing bridge, eating out and participating in church activities.   Pt oriented to exercise equipment and routine.    Understanding verbalized.    Dr. Fransico Him is Medical Director for Cardiac Rehab at [facility]

## 2021-10-12 NOTE — Progress Notes (Signed)
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 Swallow whole., Disp: 30 tablet, Rfl: 11   atorvastatin (LIPITOR) 40 MG tablet, Take 1 tablet (40 mg total) by mouth daily., Disp: 90 tablet, Rfl: 3   Calcium-Phosphorus-Vitamin D (CITRACAL +D3 PO), Take 1 tablet by mouth daily., Disp: , Rfl:    clopidogrel (PLAVIX) 75 MG tablet, Take 1 tablet (75 mg total) by mouth daily., Disp: 90 tablet, Rfl: 3   Dextrose, Diabetic Use, (GLUCOSE PO), Take 1 tablet by mouth daily as needed (hypoglycemic episodes)., Disp: , Rfl:    FLUoxetine (PROZAC) 40 MG capsule, TAKE ONE CAPSULE BY MOUTH EVERY DAY, Disp: 90 capsule, Rfl: 3   lisinopril (ZESTRIL) 5 MG tablet, Take 1 tablet (5 mg total) by mouth daily., Disp: 90 tablet, Rfl: 3   metoprolol succinate (TOPROL-XL) 25 MG 24 hr tablet, Take 0.5 tablets (12.5 mg total) by mouth daily., Disp: 60 tablet, Rfl: 1   nitroGLYCERIN (NITROSTAT) 0.4 MG SL tablet, Place 1 tablet (0.4 mg total) under the tongue every 5 (five) minutes as needed for up to 25 days for chest pain. (Patient not taking: Reported on 09/28/2021), Disp: 25 tablet, Rfl: 3   pantoprazole (PROTONIX) 40 MG tablet, Take 1 tablet (40 mg total) by mouth daily before breakfast., Disp: 30 tablet, Rfl: 2  Past Medical History: Past Medical History:  Diagnosis Date   Anxiety    Endometriosis    Fibrocystic breast    muliple drainage    Fibroid    Hiatal hernia    History of bronchitis    Hypercholesteremia 2007   currently not taking anything for it   Osteopenia 08/2016   hip and spine   Subclinical hypothyroidism 10/04/2018   Urge incontinence 06/05/2013    Tobacco Use: Social History   Tobacco Use  Smoking Status Former   Packs/day: 1.00   Years: 20.00   Pack years: 20.00   Types: Cigarettes   Quit date: 2000   Years since quitting: 23.0  Smokeless Tobacco Never  Tobacco Comments   quit in approximately 2000    Labs: Recent Review Flowsheet Data     Labs for ITP Cardiac and Pulmonary Rehab Latest Ref Rng & Units 07/28/2021 07/29/2021 07/29/2021 07/29/2021 07/29/2021   Cholestrol 0 - 200 mg/dL - - - - -   LDLCALC 0 - 99 mg/dL - - - - -   HDL >40 mg/dL - - - - -   Trlycerides <150 mg/dL - - - - -   Hemoglobin A1c 4.8 - 5.6 % - - - - -   PHART 7.350 - 7.450 7.431 7.240(L) 7.250(L) 7.269(L) 7.309(L)   PCO2ART 32.0 - 48.0 mmHg 33.4 50.6(H) 51.8(H) 47.1 48.8(H)   HCO3 20.0 - 28.0 mmol/L 22.5 21.6 22.7 21.5 24.5   TCO2 22 - 32 mmol/L 24 23 24 23 26    ACIDBASEDEF 0.0 - 2.0 mmol/L 2.0 5.0(H)  4.0(H) 5.0(H) 2.0   O2SAT % 100.0 98.0 97.0 99.0 98.0       Capillary Blood Glucose: Lab Results  Component Value Date   GLUCAP 95 07/31/2021   GLUCAP 89 07/31/2021   GLUCAP 109 (H) 07/31/2021   GLUCAP 114 (H) 07/31/2021   GLUCAP 85 07/30/2021     Exercise Target Goals: Exercise Program Goal: Individual exercise prescription set using results from initial 6 min walk test and THRR while considering  patients activity barriers and safety.   Exercise Prescription Goal: Initial exercise prescription builds to 30-45 minutes a day of aerobic activity, 2-3 days per week.  Home exercise guidelines will be given to patient during program as part of exercise prescription that the participant will acknowledge.  Activity Barriers & Risk Stratification:  Activity Barriers & Cardiac Risk Stratification - 09/28/21 1157        Activity Barriers & Cardiac Risk Stratification   Activity Barriers Other (comment);Back Problems    Comments Three broken fingers on right hand in 2017 affects grip.    Cardiac Risk Stratification High             6 Minute Walk:  6 Minute Walk     Row Name 09/28/21 1213         6 Minute Walk   Phase Initial     Distance 1340 feet     Walk Time 6 minutes     # of Rest Breaks 0     MPH 2.54     METS 3.01     RPE 13     Perceived Dyspnea  1.5     VO2 Peak 10.55     Symptoms Yes (comment)     Comments Mild SOB     Resting HR 72 bpm     Resting BP 112/60     Resting Oxygen Saturation  100 %     Exercise Oxygen Saturation  during 6 min walk 100 %     Max Ex. HR 87 bpm     Max Ex. BP 118/62     2 Minute Post BP 122/72              Oxygen Initial Assessment:   Oxygen Re-Evaluation:   Oxygen Discharge (Final Oxygen Re-Evaluation):   Initial Exercise Prescription:  Initial Exercise Prescription - 09/28/21 1200       Date of Initial Exercise RX and Referring Provider   Date 09/28/21    Referring Provider Adrian Prows, MD    Expected Discharge Date 11/26/21      Recumbant Bike   Level 1.5    Minutes 15    METs 2.5      NuStep   Level 2    SPM 85    Minutes 15    METs 2.5      Prescription Details   Frequency (times per week) 3    Duration Progress to 30 minutes of continuous aerobic without signs/symptoms of physical distress      Intensity   THRR 40-80% of Max Heartrate 60-121    Ratings of Perceived Exertion 11-13    Perceived Dyspnea 0-4      Progression   Progression Continue to progress workloads to maintain intensity without signs/symptoms of physical distress.      Resistance Training   Training Prescription Yes    Weight 2 lbs    Reps 10-15             Perform Capillary Blood Glucose checks  as needed.  Exercise Prescription Changes:   Exercise Prescription Changes     Row Name 10/04/21 1045 10/11/21 1023            Response to Exercise   Blood Pressure (Admit) 114/64 112/70      Blood Pressure (Exercise) 138/72 108/76      Blood Pressure (Exit) 110/68 101/68      Heart Rate (Admit) 69 bpm 83 bpm      Heart Rate (Exercise) 82 bpm 98 bpm      Heart Rate (Exit) 69 bpm 78 bpm      Rating of Perceived Exertion (Exercise) 14 13      Symptoms None None      Comments Recumbent bike too difficult, switched to track second station. --      Duration Progress to 30 minutes of  aerobic without signs/symptoms of physical distress Progress to 30 minutes of  aerobic without signs/symptoms of physical distress      Intensity THRR unchanged THRR unchanged        Progression   Progression Continue to progress workloads to maintain intensity without signs/symptoms of physical distress. Continue to progress workloads to maintain intensity without signs/symptoms of physical distress.      Average METs 1.5 2.3        Resistance Training   Training Prescription Yes Yes      Weight 2 lbs 2 lbs      Reps 10-15 10-15      Time 10 Minutes 10 Minutes        Interval Training   Interval Training No No        Recumbant Bike   Level 1.5 --      Minutes 4 --      METs 1.4 --        NuStep   Level 1 2  INC WL from 1 to 2 at 8 minutes      SPM 85 85      Minutes 15 15      METs 1.7 1.7        Track   Laps 3 16      Minutes 8 15      METs 1.35 2.86               Exercise Comments:   Exercise Comments     Row Name 10/04/21 1146 10/11/21 1110         Exercise Comments Patient tolerated low intensity exercise fair. Recumbent bike difficult for patient, switched to walking track. Reviewed METs with patient.               Exercise Goals and Review:   Exercise Goals     Row Name 09/28/21 1155             Exercise Goals   Increase Physical Activity Yes       Intervention Provide advice, education, support and counseling about physical activity/exercise needs.;Develop an individualized  exercise prescription for aerobic and resistive training based on initial evaluation findings, risk stratification, comorbidities and participant's personal goals.       Expected Outcomes Short Term: Attend rehab on a regular basis to increase amount of physical activity.;Long Term: Exercising regularly at least 3-5 days a week.;Long Term: Add in home exercise to make exercise part of routine and to increase amount of physical activity.       Increase Strength and Stamina Yes       Intervention Provide advice, education,  support and counseling about physical activity/exercise needs.;Develop an individualized exercise prescription for aerobic and resistive training based on initial evaluation findings, risk stratification, comorbidities and participant's personal goals.       Expected Outcomes Short Term: Increase workloads from initial exercise prescription for resistance, speed, and METs.;Short Term: Perform resistance training exercises routinely during rehab and add in resistance training at home;Long Term: Improve cardiorespiratory fitness, muscular endurance and strength as measured by increased METs and functional capacity (6MWT)       Able to understand and use rate of perceived exertion (RPE) scale Yes       Intervention Provide education and explanation on how to use RPE scale       Expected Outcomes Short Term: Able to use RPE daily in rehab to express subjective intensity level;Long Term:  Able to use RPE to guide intensity level when exercising independently       Knowledge and understanding of Target Heart Rate Range (THRR) Yes       Intervention Provide education and explanation of THRR including how the numbers were predicted and where they are located for reference       Expected Outcomes Short Term: Able to state/look up THRR;Long Term: Able to use THRR to govern intensity when exercising independently;Short Term: Able to use daily as guideline for intensity in rehab       Able to check  pulse independently Yes       Intervention Provide education and demonstration on how to check pulse in carotid and radial arteries.;Review the importance of being able to check your own pulse for safety during independent exercise       Expected Outcomes Short Term: Able to explain why pulse checking is important during independent exercise;Long Term: Able to check pulse independently and accurately       Understanding of Exercise Prescription Yes       Intervention Provide education, explanation, and written materials on patient's individual exercise prescription       Expected Outcomes Short Term: Able to explain program exercise prescription;Long Term: Able to explain home exercise prescription to exercise independently                Exercise Goals Re-Evaluation :  Exercise Goals Re-Evaluation     Row Name 10/04/21 1146             Exercise Goal Re-Evaluation   Exercise Goals Review Increase Physical Activity;Able to understand and use rate of perceived exertion (RPE) scale;Increase Strength and Stamina       Comments Patient able to understand and use RPE scale appropriately.       Expected Outcomes Progess workloads as tolerated to help increase strength and stamina.                Discharge Exercise Prescription (Final Exercise Prescription Changes):  Exercise Prescription Changes - 10/11/21 1023       Response to Exercise   Blood Pressure (Admit) 112/70    Blood Pressure (Exercise) 108/76    Blood Pressure (Exit) 101/68    Heart Rate (Admit) 83 bpm    Heart Rate (Exercise) 98 bpm    Heart Rate (Exit) 78 bpm    Rating of Perceived Exertion (Exercise) 13    Symptoms None    Duration Progress to 30 minutes of  aerobic without signs/symptoms of physical distress    Intensity THRR unchanged      Progression   Progression Continue to progress workloads to maintain intensity without signs/symptoms  of physical distress.    Average METs 2.3      Resistance  Training   Training Prescription Yes    Weight 2 lbs    Reps 10-15    Time 10 Minutes      Interval Training   Interval Training No      NuStep   Level 2   INC WL from 1 to 2 at 8 minutes   SPM 85    Minutes 15    METs 1.7      Track   Laps 16    Minutes 15    METs 2.86             Nutrition:  Target Goals: Understanding of nutrition guidelines, daily intake of sodium 1500mg , cholesterol 200mg , calories 30% from fat and 7% or less from saturated fats, daily to have 5 or more servings of fruits and vegetables.  Biometrics:  Pre Biometrics - 09/28/21 1054       Pre Biometrics   Waist Circumference 32 inches    Hip Circumference 37.25 inches    Waist to Hip Ratio 0.86 %    Triceps Skinfold 20 mm    % Body Fat 33.6 %    Grip Strength 16 kg    Flexibility 0 in    Single Leg Stand 23.81 seconds              Nutrition Therapy Plan and Nutrition Goals:   Nutrition Assessments:  MEDIFICTS Score Key: ?70 Need to make dietary changes  40-70 Heart Healthy Diet ? 40 Therapeutic Level Cholesterol Diet    Picture Your Plate Scores: <93 Unhealthy dietary pattern with much room for improvement. 41-50 Dietary pattern unlikely to meet recommendations for good health and room for improvement. 51-60 More healthful dietary pattern, with some room for improvement.  >60 Healthy dietary pattern, although there may be some specific behaviors that could be improved.    Nutrition Goals Re-Evaluation:   Nutrition Goals Re-Evaluation:   Nutrition Goals Discharge (Final Nutrition Goals Re-Evaluation):   Psychosocial: Target Goals: Acknowledge presence or absence of significant depression and/or stress, maximize coping skills, provide positive support system. Participant is able to verbalize types and ability to use techniques and skills needed for reducing stress and depression.  Initial Review & Psychosocial Screening:  Initial Psych Review & Screening - 09/28/21  1506       Initial Review   Current issues with None Identified      Family Dynamics   Good Support System? Yes    Comments Kang has a daughter and grandchildren (4) who live in [place].  Son who lives in [place].  Roderic Scarce supported and remarks that she is feeling "really good" and didn't really know how bad she was really feeling.  Aubria feels care free and doesn't let 'stuff" bother her      Barriers   Psychosocial barriers to participate in program There are no identifiable barriers or psychosocial needs.      Screening Interventions   Interventions Encouraged to exercise;Provide feedback about the scores to participant    Expected Outcomes Short Term goal: Identification and review with participant of any Quality of Life or Depression concerns found by scoring the questionnaire.;Long Term goal: The participant improves quality of Life and PHQ9 Scores as seen by post scores and/or verbalization of changes             Quality of Life Scores:  Quality of Life - 09/28/21 1407  Quality of Life   Select Quality of Life      Quality of Life Scores   Health/Function Pre 24 %    Socioeconomic Pre 27 %    Psych/Spiritual Pre 27.43 %    Family Pre 24 %    GLOBAL Pre 25.35 %            Scores of 19 and below usually indicate a poorer quality of life in these areas.  A difference of  2-3 points is a clinically meaningful difference.  A difference of 2-3 points in the total score of the Quality of Life Index has been associated with significant improvement in overall quality of life, self-image, physical symptoms, and general health in studies assessing change in quality of life.  PHQ-9: Recent Review Flowsheet Data     Depression screen [facility] 2/9 09/28/2021 10/30/2019 10/04/2018 09/29/2017 07/20/2015   Decreased Interest 0 0 0 0 1   Down, Depressed, Hopeless 0 0 0 0 0   PHQ - 2 Score 0 0 0 0 1   Altered sleeping 0 - - - -   Tired, decreased energy 0 - - - -   Change in  appetite 0 - - - -   Feeling bad or failure about yourself  0 - - - -   Trouble concentrating 0 - - - -   Moving slowly or fidgety/restless 0 - - - -   Suicidal thoughts 0 - - - -   PHQ-9 Score 0 - - - -   Difficult doing work/chores Not difficult at all - - - -      Interpretation of Total Score  Total Score Depression Severity:  1-4 = Minimal depression, 5-9 = Mild depression, 10-14 = Moderate depression, 15-19 = Moderately severe depression, 20-27 = Severe depression   Psychosocial Evaluation and Intervention:   Psychosocial Re-Evaluation:  Psychosocial Re-Evaluation     Row Name 10/11/21 1746             Psychosocial Re-Evaluation   Current issues with None Identified       Interventions Encouraged to attend Cardiac Rehabilitation for the exercise       Continue Psychosocial Services  No Follow up required                Psychosocial Discharge (Final Psychosocial Re-Evaluation):  Psychosocial Re-Evaluation - 10/11/21 1746       Psychosocial Re-Evaluation   Current issues with None Identified    Interventions Encouraged to attend Cardiac Rehabilitation for the exercise    Continue Psychosocial Services  No Follow up required             Vocational Rehabilitation: Provide vocational rehab assistance to qualifying candidates.   Vocational Rehab Evaluation & Intervention:  Vocational Rehab - 09/28/21 1509       Initial Vocational Rehab Evaluation & Intervention   Assessment shows need for Vocational Rehabilitation No      Vocational Rehab Re-Evaulation   Comments Retired Pharmacist, hospital from [place]             Education: Education Goals: Education classes will be provided on a weekly basis, covering required topics. Participant will state understanding/return demonstration of topics presented.  Learning Barriers/Preferences:  Learning Barriers/Preferences - 09/28/21 1509       Learning Barriers/Preferences   Learning Barriers Sight;Hearing     Learning Preferences Individual Instruction;Group Instruction;Written Material             Education Topics: Count Your Pulse:  -  Group instruction provided by verbal instruction, demonstration, patient participation and written materials to support subject.  Instructors address importance of being able to find your pulse and how to count your pulse when at home without a heart monitor.  Patients get hands on experience counting their pulse with staff help and individually.   Heart Attack, Angina, and Risk Factor Modification:  -Group instruction provided by verbal instruction, video, and written materials to support subject.  Instructors address signs and symptoms of angina and heart attacks.    Also discuss risk factors for heart disease and how to make changes to improve heart health risk factors.   Functional Fitness:  -Group instruction provided by verbal instruction, demonstration, patient participation, and written materials to support subject.  Instructors address safety measures for doing things around the house.  Discuss how to get up and down off the floor, how to pick things up properly, how to safely get out of a chair without assistance, and balance training.   Meditation and Mindfulness:  -Group instruction provided by verbal instruction, patient participation, and written materials to support subject.  Instructor addresses importance of mindfulness and meditation practice to help reduce stress and improve awareness.  Instructor also leads participants through a meditation exercise.    Stretching for Flexibility and Mobility:  -Group instruction provided by verbal instruction, patient participation, and written materials to support subject.  Instructors lead participants through series of stretches that are designed to increase flexibility thus improving mobility.  These stretches are additional exercise for major muscle groups that are typically performed during regular warm  up and cool down.   Hands Only CPR:  -Group verbal, video, and participation provides a basic overview of AHA guidelines for community CPR. Role-play of emergencies allow participants the opportunity to practice calling for help and chest compression technique with discussion of AED use.   Hypertension: -Group verbal and written instruction that provides a basic overview of hypertension including the most recent diagnostic guidelines, risk factor reduction with self-care instructions and medication management.    Nutrition I class: Heart Healthy Eating:  -Group instruction provided by PowerPoint slides, verbal discussion, and written materials to support subject matter. The instructor gives an explanation and review of the Therapeutic Lifestyle Changes diet recommendations, which includes a discussion on lipid goals, dietary fat, sodium, fiber, plant stanol/sterol esters, sugar, and the components of a well-balanced, healthy diet.   Nutrition II class: Lifestyle Skills:  -Group instruction provided by PowerPoint slides, verbal discussion, and written materials to support subject matter. The instructor gives an explanation and review of label reading, grocery shopping for heart health, heart healthy recipe modifications, and ways to make healthier choices when eating out.   Diabetes Question & Answer:  -Group instruction provided by PowerPoint slides, verbal discussion, and written materials to support subject matter. The instructor gives an explanation and review of diabetes co-morbidities, pre- and post-prandial blood glucose goals, pre-exercise blood glucose goals, signs, symptoms, and treatment of hypoglycemia and hyperglycemia, and foot care basics.   Diabetes Blitz:  -Group instruction provided by PowerPoint slides, verbal discussion, and written materials to support subject matter. The instructor gives an explanation and review of the physiology behind type 1 and type 2 diabetes,  diabetes medications and rational behind using different medications, pre- and post-prandial blood glucose recommendations and Hemoglobin A1c goals, diabetes diet, and exercise including blood glucose guidelines for exercising safely.    Portion Distortion:  -Group instruction provided by PowerPoint slides, verbal discussion, written materials, and food  models to support subject matter. The instructor gives an explanation of serving size versus portion size, changes in portions sizes over the last 20 years, and what consists of a serving from each food group.   Stress Management:  -Group instruction provided by verbal instruction, video, and written materials to support subject matter.  Instructors review role of stress in heart disease and how to cope with stress positively.     Exercising on Your Own:  -Group instruction provided by verbal instruction, power point, and written materials to support subject.  Instructors discuss benefits of exercise, components of exercise, frequency and intensity of exercise, and end points for exercise.  Also discuss use of nitroglycerin and activating EMS.  Review options of places to exercise outside of rehab.  Review guidelines for sex with heart disease.   Cardiac Drugs I:  -Group instruction provided by verbal instruction and written materials to support subject.  Instructor reviews cardiac drug classes: antiplatelets, anticoagulants, beta blockers, and statins.  Instructor discusses reasons, side effects, and lifestyle considerations for each drug class.   Cardiac Drugs II:  -Group instruction provided by verbal instruction and written materials to support subject.  Instructor reviews cardiac drug classes: angiotensin converting enzyme inhibitors (ACE-I), angiotensin II receptor blockers (ARBs), nitrates, and calcium channel blockers.  Instructor discusses reasons, side effects, and lifestyle considerations for each drug class.   Anatomy and Physiology  of the Circulatory System:  Group verbal and written instruction and models provide basic cardiac anatomy and physiology, with the coronary electrical and arterial systems. Review of: AMI, Angina, Valve disease, Heart Failure, Peripheral Artery Disease, Cardiac Arrhythmia, Pacemakers, and the ICD.   Other Education:  -Group or individual verbal, written, or video instructions that support the educational goals of the cardiac rehab program.   Holiday Eating Survival Tips:  -Group instruction provided by PowerPoint slides, verbal discussion, and written materials to support subject matter. The instructor gives patients tips, tricks, and techniques to help them not only survive but enjoy the holidays despite the onslaught of food that accompanies the holidays.   Knowledge Questionnaire Score:  Knowledge Questionnaire Score - 09/28/21 1407       Knowledge Questionnaire Score   Pre Score 21/24             Core Components/Risk Factors/Patient Goals at Admission:  Personal Goals and Risk Factors at Admission - 09/28/21 1057       Core Components/Risk Factors/Patient Goals on Admission   Lipids Yes    Intervention Provide education and support for participant on nutrition & aerobic/resistive exercise along with prescribed medications to achieve LDL 70mg , HDL >40mg .    Expected Outcomes Short Term: Participant states understanding of desired cholesterol values and is compliant with medications prescribed. Participant is following exercise prescription and nutrition guidelines.;Long Term: Cholesterol controlled with medications as prescribed, with individualized exercise RX and with personalized nutrition plan. Value goals: LDL < 70mg , HDL > 40 mg.             Core Components/Risk Factors/Patient Goals Review:   Goals and Risk Factor Review     Row Name 10/11/21 1746             Core Components/Risk Factors/Patient Goals Review   Personal Goals Review Lipids       Review  Auriah is off to a good start to exercise for her fitness level. Apoorva's vital signs and CBG's have been stable       Expected Outcomes Kyndall will continue to participate  in phase 2 CR for exercise, nutrition and life style modificaitons.                Core Components/Risk Factors/Patient Goals at Discharge (Final Review):   Goals and Risk Factor Review - 10/11/21 1746       Core Components/Risk Factors/Patient Goals Review   Personal Goals Review Lipids    Review Gisela is off to a good start to exercise for her fitness level. Kemaria's vital signs and CBG's have been stable    Expected Outcomes Jadyn will continue to participate in phase 2 CR for exercise, nutrition and life style modificaitons.             ITP Comments:  ITP Comments     Row Name 09/28/21 1054 10/04/21 1209 10/11/21 1746       ITP Comments Medical Director- Dr. Fransico Him, MD 30 Day ITP Review. Krystie started cardiac rehab on 10/04/21 and did fair with exercise for her fitness level 30 Day ITP Review. Soraya is off to a good start to exercise.              Comments: See ITP Comments

## 2021-10-13 ENCOUNTER — Encounter (HOSPITAL_COMMUNITY): Payer: Medicare Other

## 2021-10-15 ENCOUNTER — Other Ambulatory Visit: Payer: Self-pay

## 2021-10-15 ENCOUNTER — Encounter (HOSPITAL_COMMUNITY)
Admission: RE | Admit: 2021-10-15 | Discharge: 2021-10-15 | Disposition: A | Discharge status: Home / Self Care | Payer: Medicare Other | Source: Ambulatory Visit | Attending: Cardiology | Admitting: Cardiology

## 2021-10-15 DIAGNOSIS — Z951 Presence of aortocoronary bypass graft: Secondary | ICD-10-CM

## 2021-10-18 ENCOUNTER — Encounter (HOSPITAL_COMMUNITY): Payer: Medicare Other

## 2021-10-20 ENCOUNTER — Encounter (HOSPITAL_COMMUNITY): Payer: Medicare Other

## 2021-10-22 ENCOUNTER — Other Ambulatory Visit: Payer: Self-pay

## 2021-10-22 ENCOUNTER — Encounter (HOSPITAL_COMMUNITY)
Admission: RE | Admit: 2021-10-22 | Discharge: 2021-10-22 | Disposition: A | Discharge status: Home / Self Care | Payer: Medicare Other | Source: Ambulatory Visit | Attending: Cardiology | Admitting: Cardiology

## 2021-10-22 DIAGNOSIS — Z951 Presence of aortocoronary bypass graft: Secondary | ICD-10-CM | POA: Diagnosis not present

## 2021-10-22 NOTE — Progress Notes (Signed)
Reviewed home exercise guidelines with patient including endpoints, temperature precautions, target heart rate and rate of perceived exertion. Patient plans to walk as her mode of home exercise. Patient voices understanding of instructions given.  Lothar Prehn M Shanieka Blea, MS, ACSM CEP  

## 2021-10-25 ENCOUNTER — Other Ambulatory Visit: Payer: Self-pay

## 2021-10-25 ENCOUNTER — Encounter (HOSPITAL_COMMUNITY)
Admission: RE | Admit: 2021-10-25 | Discharge: 2021-10-25 | Disposition: A | Discharge status: Home / Self Care | Payer: Medicare Other | Source: Ambulatory Visit | Attending: Cardiology | Admitting: Cardiology

## 2021-10-25 DIAGNOSIS — Z951 Presence of aortocoronary bypass graft: Secondary | ICD-10-CM | POA: Diagnosis not present

## 2021-10-27 ENCOUNTER — Telehealth (HOSPITAL_COMMUNITY): Payer: Self-pay | Admitting: Physician Assistant

## 2021-10-27 ENCOUNTER — Encounter (HOSPITAL_COMMUNITY): Payer: Medicare Other

## 2021-10-27 ENCOUNTER — Telehealth (HOSPITAL_COMMUNITY): Payer: Self-pay | Admitting: *Deleted

## 2021-10-27 NOTE — Telephone Encounter (Signed)
Spoke with Marissa Lee she has a runny nose, cough and sore throat. I advised Marissa Lee to follow up with her primary care provider Marissa Lee [facility] regarding her symptoms. I also asked [name] to get test for COVID 19.Barnet Pall, RN,BSN 10/27/2021 9:46 AM

## 2021-10-29 ENCOUNTER — Encounter (HOSPITAL_COMMUNITY): Payer: Medicare Other

## 2021-11-01 ENCOUNTER — Encounter (HOSPITAL_COMMUNITY)
Admission: RE | Admit: 2021-11-01 | Discharge: 2021-11-01 | Disposition: A | Discharge status: Home / Self Care | Payer: Medicare Other | Source: Ambulatory Visit | Attending: Cardiology | Admitting: Cardiology

## 2021-11-01 ENCOUNTER — Other Ambulatory Visit: Payer: Self-pay

## 2021-11-01 DIAGNOSIS — Z48812 Encounter for surgical aftercare following surgery on the circulatory system: Secondary | ICD-10-CM | POA: Insufficient documentation

## 2021-11-01 DIAGNOSIS — Z951 Presence of aortocoronary bypass graft: Secondary | ICD-10-CM | POA: Insufficient documentation

## 2021-11-01 NOTE — Progress Notes (Signed)
Incomplete Session Note  Patient Details  Name: Marissa Lee MRN: [id number] Date of Birth: Oct 02, 1950 Referring Provider:   Flowsheet Row CARDIAC REHAB PHASE II ORIENTATION from 09/28/2021 in [place]  Referring Provider Adrian Prows, MD       Lorrin Jackson did not complete her rehab session.  Jesyca returned to cardiac rehab with complaints of a lingering cough from an upper respiratory infection. Serinity advised not to exercise today. Joaquina plans to return to exercise on Wednesday if she is feeling better.Barnet Pall, RN,BSN 11/02/2021 7:55 AM

## 2021-11-02 ENCOUNTER — Telehealth: Payer: Self-pay

## 2021-11-02 NOTE — Telephone Encounter (Signed)
Yes she is to be on Zetia. JG had debated starting and looks like PCP subsequently did start it.

## 2021-11-02 NOTE — Telephone Encounter (Signed)
Patient is confused about if she should be on zetia or not. She has been taking it for the last few weeks but isnt sure. Please advise. I tried to look but I got confused as it is not on her list

## 2021-11-03 ENCOUNTER — Telehealth (HOSPITAL_COMMUNITY): Payer: Self-pay | Admitting: Physician Assistant

## 2021-11-03 ENCOUNTER — Encounter (HOSPITAL_COMMUNITY): Payer: Medicare Other

## 2021-11-05 ENCOUNTER — Encounter (HOSPITAL_COMMUNITY): Payer: Medicare Other

## 2021-11-08 ENCOUNTER — Encounter (HOSPITAL_COMMUNITY): Payer: Medicare Other

## 2021-11-09 ENCOUNTER — Telehealth (HOSPITAL_COMMUNITY): Payer: Self-pay | Admitting: Physician Assistant

## 2021-11-10 ENCOUNTER — Encounter (HOSPITAL_COMMUNITY): Payer: Medicare Other

## 2021-11-10 NOTE — Progress Notes (Signed)
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 Swallow whole., Disp: 30 tablet, Rfl: 11   atorvastatin (LIPITOR) 40 MG tablet, Take 1 tablet (40 mg total) by mouth daily., Disp: 90 tablet, Rfl: 3   Calcium-Phosphorus-Vitamin D (CITRACAL +D3 PO), Take 1 tablet by mouth daily., Disp: , Rfl:    clopidogrel (PLAVIX) 75 MG tablet, Take 1 tablet (75 mg total) by mouth daily., Disp: 90 tablet, Rfl: 3   Dextrose, Diabetic Use, (GLUCOSE PO), Take 1 tablet by mouth daily as needed (hypoglycemic episodes)., Disp: , Rfl:    ezetimibe (ZETIA) 10 MG tablet, Take 10 mg by mouth daily., Disp: , Rfl:    FLUoxetine (PROZAC) 40 MG capsule, TAKE ONE CAPSULE BY MOUTH EVERY DAY, Disp: 90 capsule, Rfl: 3   lisinopril (ZESTRIL) 5 MG tablet, Take 1 tablet (5 mg total) by mouth daily., Disp: 90 tablet, Rfl: 3   metoprolol succinate (TOPROL-XL) 25 MG 24 hr tablet, Take 0.5 tablets (12.5 mg total) by mouth daily., Disp: 60 tablet, Rfl: 1   nitroGLYCERIN (NITROSTAT) 0.4 MG SL tablet, Place 1 tablet (0.4 mg total) under the tongue every 5 (five) minutes as needed for up to 25 days for chest pain. (Patient not taking: Reported on 09/28/2021), Disp: 25 tablet, Rfl: 3   pantoprazole (PROTONIX) 40 MG tablet, Take 1 tablet (40 mg total) by mouth daily before breakfast., Disp: 30 tablet, Rfl: 2  Past Medical History: Past Medical History:  Diagnosis  Date   Anxiety    Endometriosis    Fibrocystic breast    muliple drainage   Fibroid    Hiatal hernia    History of bronchitis    Hypercholesteremia 2007   currently not taking anything for it   Osteopenia 08/2016   hip and spine   Subclinical hypothyroidism 10/04/2018   Urge incontinence 06/05/2013    Tobacco Use: Social History   Tobacco Use  Smoking Status Former   Packs/day: 1.00   Years: 20.00   Pack years: 20.00   Types: Cigarettes   Quit date: 2000   Years since quitting: 23.1  Smokeless Tobacco Never  Tobacco Comments   quit in approximately 2000    Labs: Recent Review Flowsheet Data     Labs for ITP Cardiac and Pulmonary Rehab Latest Ref Rng & Units 07/28/2021 07/29/2021 07/29/2021 07/29/2021 07/29/2021   Cholestrol 0 - 200 mg/dL - - - - -   LDLCALC 0 - 99 mg/dL - - - - -   HDL >40 mg/dL - - - - -   Trlycerides <150 mg/dL - - - - -   Hemoglobin A1c 4.8 - 5.6 % - - - - -   PHART 7.350 - 7.450 7.431 7.240(L) 7.250(L) 7.269(L) 7.309(L)   PCO2ART 32.0 - 48.0 mmHg 33.4 50.6(H) 51.8(H) 47.1 48.8(H)   HCO3 20.0 - 28.0 mmol/L 22.5 21.6 22.7 21.5 24.5   TCO2 22 -  32 mmol/L 24 23 24 23 26    ACIDBASEDEF 0.0 - 2.0 mmol/L 2.0 5.0(H) 4.0(H) 5.0(H) 2.0   O2SAT % 100.0 98.0 97.0 99.0 98.0       Capillary Blood Glucose: Lab Results  Component Value Date   GLUCAP 95 07/31/2021   GLUCAP 89 07/31/2021   GLUCAP 109 (H) 07/31/2021   GLUCAP 114 (H) 07/31/2021   GLUCAP 85 07/30/2021     Exercise Target Goals: Exercise Program Goal: Individual exercise prescription set using results from initial 6 min walk test and THRR while considering  patients activity barriers and safety.   Exercise Prescription Goal: Initial exercise prescription builds to 30-45 minutes a day of aerobic activity, 2-3 days per week.  Home exercise guidelines will be given to patient during program as part of exercise prescription that the participant will acknowledge.  Activity Barriers & Risk  Stratification:  Activity Barriers & Cardiac Risk Stratification - 09/28/21 1157       Activity Barriers & Cardiac Risk Stratification   Activity Barriers Other (comment);Back Problems    Comments Three broken fingers on right hand in 2017 affects grip.    Cardiac Risk Stratification High             6 Minute Walk:  6 Minute Walk     Row Name 09/28/21 1213         6 Minute Walk   Phase Initial     Distance 1340 feet     Walk Time 6 minutes     # of Rest Breaks 0     MPH 2.54     METS 3.01     RPE 13     Perceived Dyspnea  1.5     VO2 Peak 10.55     Symptoms Yes (comment)     Comments Mild SOB     Resting HR 72 bpm     Resting BP 112/60     Resting Oxygen Saturation  100 %     Exercise Oxygen Saturation  during 6 min walk 100 %     Max Ex. HR 87 bpm     Max Ex. BP 118/62     2 Minute Post BP 122/72              Oxygen Initial Assessment:   Oxygen Re-Evaluation:   Oxygen Discharge (Final Oxygen Re-Evaluation):   Initial Exercise Prescription:  Initial Exercise Prescription - 09/28/21 1200       Date of Initial Exercise RX and Referring Provider   Date 09/28/21    Referring Provider Adrian Prows, MD    Expected Discharge Date 11/26/21      Recumbant Bike   Level 1.5    Minutes 15    METs 2.5      NuStep   Level 2    SPM 85    Minutes 15    METs 2.5      Prescription Details   Frequency (times per week) 3    Duration Progress to 30 minutes of continuous aerobic without signs/symptoms of physical distress      Intensity   THRR 40-80% of Max Heartrate 60-121    Ratings of Perceived Exertion 11-13    Perceived Dyspnea 0-4      Progression   Progression Continue to progress workloads to maintain intensity without signs/symptoms of physical distress.      Resistance Training   Training Prescription Yes    Weight 2 lbs    Reps 10-15  Perform Capillary Blood Glucose checks as needed.  Exercise Prescription  Changes:   Exercise Prescription Changes     Row Name 10/04/21 1045 10/11/21 1023 10/25/21 1041         Response to Exercise   Blood Pressure (Admit) 114/64 112/70 112/76     Blood Pressure (Exercise) 138/72 108/76 154/78     Blood Pressure (Exit) 110/68 101/68 104/70     Heart Rate (Admit) 69 bpm 83 bpm 67 bpm     Heart Rate (Exercise) 82 bpm 98 bpm 89 bpm     Heart Rate (Exit) 69 bpm 78 bpm 72 bpm     Rating of Perceived Exertion (Exercise) 14 13 12      Symptoms None None None     Comments Recumbent bike too difficult, switched to track second station. -- --     Duration Progress to 30 minutes of  aerobic without signs/symptoms of physical distress Progress to 30 minutes of  aerobic without signs/symptoms of physical distress Progress to 30 minutes of  aerobic without signs/symptoms of physical distress     Intensity THRR unchanged THRR unchanged THRR unchanged       Progression   Progression Continue to progress workloads to maintain intensity without signs/symptoms of physical distress. Continue to progress workloads to maintain intensity without signs/symptoms of physical distress. Continue to progress workloads to maintain intensity without signs/symptoms of physical distress.     Average METs 1.5 2.3 1.9       Resistance Training   Training Prescription Yes Yes Yes     Weight 2 lbs 2 lbs 2 lbs     Reps 10-15 10-15 10-15     Time 10 Minutes 10 Minutes 10 Minutes       Interval Training   Interval Training No No No       Recumbant Bike   Level 1.5 -- --     Minutes 4 -- --     METs 1.4 -- --       NuStep   Level 1 2  INC WL from 1 to 2 at 8 minutes 2     SPM 85 85 85     Minutes 15 15 15      METs 1.7 1.7 1.9       Track   Laps 3 16 8      Minutes 8 15 15      METs 1.35 2.86 1.93       Home Exercise Plan   Plans to continue exercise at -- -- Home (comment)  Walking     Frequency -- -- Add 2 additional days to program exercise sessions.     Initial Home Exercises  Provided -- -- 10/22/21              Exercise Comments:   Exercise Comments     Row Name 10/04/21 1146 10/11/21 1110 10/22/21 1057 10/25/21 1051     Exercise Comments Patient tolerated low intensity exercise fair. Recumbent bike difficult for patient, switched to walking track. Reviewed METs with patient. Reviewed home exercise guidelines and goals with patient. Reviewed METs and goals with patient.             Exercise Goals and Review:   Exercise Goals     Row Name 09/28/21 1155             Exercise Goals   Increase Physical Activity Yes       Intervention Provide advice, education, support and counseling about physical  activity/exercise needs.;Develop an individualized exercise prescription for aerobic and resistive training based on initial evaluation findings, risk stratification, comorbidities and participant's personal goals.       Expected Outcomes Short Term: Attend rehab on a regular basis to increase amount of physical activity.;Long Term: Exercising regularly at least 3-5 days a week.;Long Term: Add in home exercise to make exercise part of routine and to increase amount of physical activity.       Increase Strength and Stamina Yes       Intervention Provide advice, education, support and counseling about physical activity/exercise needs.;Develop an individualized exercise prescription for aerobic and resistive training based on initial evaluation findings, risk stratification, comorbidities and participant's personal goals.       Expected Outcomes Short Term: Increase workloads from initial exercise prescription for resistance, speed, and METs.;Short Term: Perform resistance training exercises routinely during rehab and add in resistance training at home;Long Term: Improve cardiorespiratory fitness, muscular endurance and strength as measured by increased METs and functional capacity (6MWT)       Able to understand and use rate of perceived exertion (RPE) scale Yes        Intervention Provide education and explanation on how to use RPE scale       Expected Outcomes Short Term: Able to use RPE daily in rehab to express subjective intensity level;Long Term:  Able to use RPE to guide intensity level when exercising independently       Knowledge and understanding of Target Heart Rate Range (THRR) Yes       Intervention Provide education and explanation of THRR including how the numbers were predicted and where they are located for reference       Expected Outcomes Short Term: Able to state/look up THRR;Long Term: Able to use THRR to govern intensity when exercising independently;Short Term: Able to use daily as guideline for intensity in rehab       Able to check pulse independently Yes       Intervention Provide education and demonstration on how to check pulse in carotid and radial arteries.;Review the importance of being able to check your own pulse for safety during independent exercise       Expected Outcomes Short Term: Able to explain why pulse checking is important during independent exercise;Long Term: Able to check pulse independently and accurately       Understanding of Exercise Prescription Yes       Intervention Provide education, explanation, and written materials on patient's individual exercise prescription       Expected Outcomes Short Term: Able to explain program exercise prescription;Long Term: Able to explain home exercise prescription to exercise independently                Exercise Goals Re-Evaluation :  Exercise Goals Re-Evaluation     Row Name 10/04/21 1146 10/22/21 1057 10/25/21 1051 11/10/21 1432       Exercise Goal Re-Evaluation   Exercise Goals Review Increase Physical Activity;Able to understand and use rate of perceived exertion (RPE) scale;Increase Strength and Stamina Increase Physical Activity;Able to understand and use rate of perceived exertion (RPE) scale;Increase Strength and Stamina;Knowledge and understanding of  Target Heart Rate Range (THRR);Understanding of Exercise Prescription Increase Physical Activity;Able to understand and use rate of perceived exertion (RPE) scale;Increase Strength and Stamina;Knowledge and understanding of Target Heart Rate Range (THRR);Understanding of Exercise Prescription --    Comments Patient able to understand and use RPE scale appropriately. Reviewed exercise prescription with patient. Patient not currently  exercising at home, halls in home not long enough and too cold outside. Patient will start walking weather permitting. Discussed increasing workloads/pace at cardiac rehab to help increase energy/stamina and increasing hand weights to help increase strength. Patient increased from 2 to 3 lb weights today. Patient making gradual progress with exercise. Reviewed METs with patient, and she states she gets tired doing laundry. Discussed increasing pace on the recumbent stepper and the walking track to help increase functional capacity, and patient is amenable to this. Patient has been absent from cardiac rehab since 10/25/2021 due to upper respiratory infection. Patient cleared to return but is out of town this week. Patient plans to return on Monday 11/15/2021. Unable to review goals at this time.    Expected Outcomes Progess workloads as tolerated to help increase strength and stamina. Patient will add 1-2 days walking 30 minutes at home. Increase workloads/pace at cardiac rehab. Continue to progress workloads as tolerated to help improve functional capacity. Review goals with patient upon return to exercise.             Discharge Exercise Prescription (Final Exercise Prescription Changes):  Exercise Prescription Changes - 10/25/21 1041       Response to Exercise   Blood Pressure (Admit) 112/76    Blood Pressure (Exercise) 154/78    Blood Pressure (Exit) 104/70    Heart Rate (Admit) 67 bpm    Heart Rate (Exercise) 89 bpm    Heart Rate (Exit) 72 bpm    Rating of Perceived  Exertion (Exercise) 12    Symptoms None    Duration Progress to 30 minutes of  aerobic without signs/symptoms of physical distress    Intensity THRR unchanged      Progression   Progression Continue to progress workloads to maintain intensity without signs/symptoms of physical distress.    Average METs 1.9      Resistance Training   Training Prescription Yes    Weight 2 lbs    Reps 10-15    Time 10 Minutes      Interval Training   Interval Training No      NuStep   Level 2    SPM 85    Minutes 15    METs 1.9      Track   Laps 8    Minutes 15    METs 1.93      Home Exercise Plan   Plans to continue exercise at Home (comment)   Walking   Frequency Add 2 additional days to program exercise sessions.    Initial Home Exercises Provided 10/22/21             Nutrition:  Target Goals: Understanding of nutrition guidelines, daily intake of sodium 1500mg , cholesterol 200mg , calories 30% from fat and 7% or less from saturated fats, daily to have 5 or more servings of fruits and vegetables.  Biometrics:  Pre Biometrics - 09/28/21 1054       Pre Biometrics   Waist Circumference 32 inches    Hip Circumference 37.25 inches    Waist to Hip Ratio 0.86 %    Triceps Skinfold 20 mm    % Body Fat 33.6 %    Grip Strength 16 kg    Flexibility 0 in    Single Leg Stand 23.81 seconds              Nutrition Therapy Plan and Nutrition Goals:   Nutrition Assessments:  MEDIFICTS Score Key: ?70 Need to make dietary changes  40-70  Heart Healthy Diet ? 40 Therapeutic Level Cholesterol Diet    Picture Your Plate Scores: <51 Unhealthy dietary pattern with much room for improvement. 41-50 Dietary pattern unlikely to meet recommendations for good health and room for improvement. 51-60 More healthful dietary pattern, with some room for improvement.  >60 Healthy dietary pattern, although there may be some specific behaviors that could be improved.    Nutrition Goals  Re-Evaluation:   Nutrition Goals Re-Evaluation:   Nutrition Goals Discharge (Final Nutrition Goals Re-Evaluation):   Psychosocial: Target Goals: Acknowledge presence or absence of significant depression and/or stress, maximize coping skills, provide positive support system. Participant is able to verbalize types and ability to use techniques and skills needed for reducing stress and depression.  Initial Review & Psychosocial Screening:  Initial Psych Review & Screening - 09/28/21 1506       Initial Review   Current issues with None Identified      Family Dynamics   Good Support System? Yes    Comments Marissa Lee has a daughter and grandchildren (4) who live in [place].  Son who lives in [place].  Marissa Lee supported and remarks that she is feeling "really good" and didn't really know how bad she was really feeling.  Marissa Lee feels care free and doesn't let 'stuff" bother her      Barriers   Psychosocial barriers to participate in program There are no identifiable barriers or psychosocial needs.      Screening Interventions   Interventions Encouraged to exercise;Provide feedback about the scores to participant    Expected Outcomes Short Term goal: Identification and review with participant of any Quality of Life or Depression concerns found by scoring the questionnaire.;Long Term goal: The participant improves quality of Life and PHQ9 Scores as seen by post scores and/or verbalization of changes             Quality of Life Scores:  Quality of Life - 09/28/21 1407       Quality of Life   Select Quality of Life      Quality of Life Scores   Health/Function Pre 24 %    Socioeconomic Pre 27 %    Psych/Spiritual Pre 27.43 %    Family Pre 24 %    GLOBAL Pre 25.35 %            Scores of 19 and below usually indicate a poorer quality of life in these areas.  A difference of  2-3 points is a clinically meaningful difference.  A difference of 2-3 points in the total score of  the Quality of Life Index has been associated with significant improvement in overall quality of life, self-image, physical symptoms, and general health in studies assessing change in quality of life.  PHQ-9: Recent Review Flowsheet Data     Depression screen [facility] 2/9 09/28/2021 10/30/2019 10/04/2018 09/29/2017 07/20/2015   Decreased Interest 0 0 0 0 1   Down, Depressed, Hopeless 0 0 0 0 0   PHQ - 2 Score 0 0 0 0 1   Altered sleeping 0 - - - -   Tired, decreased energy 0 - - - -   Change in appetite 0 - - - -   Feeling bad or failure about yourself  0 - - - -   Trouble concentrating 0 - - - -   Moving slowly or fidgety/restless 0 - - - -   Suicidal thoughts 0 - - - -   PHQ-9 Score 0 - - - -  Difficult doing work/chores Not difficult at all - - - -      Interpretation of Total Score  Total Score Depression Severity:  1-4 = Minimal depression, 5-9 = Mild depression, 10-14 = Moderate depression, 15-19 = Moderately severe depression, 20-27 = Severe depression   Psychosocial Evaluation and Intervention:   Psychosocial Re-Evaluation:  Psychosocial Re-Evaluation     [place] Name 10/11/21 1746 11/09/21 1645           Psychosocial Re-Evaluation   Current issues with None Identified None Identified      Comments -- Marissa Lee to reassess patient has been absent from class.      Interventions Encouraged to attend Cardiac Rehabilitation for the exercise Encouraged to attend Cardiac Rehabilitation for the exercise      Continue Psychosocial Services  No Follow up required No Follow up required               Psychosocial Discharge (Final Psychosocial Re-Evaluation):  Psychosocial Re-Evaluation - 11/09/21 1645       Psychosocial Re-Evaluation   Current issues with None Identified    Comments Marissa Lee to reassess patient has been absent from class.    Interventions Encouraged to attend Cardiac Rehabilitation for the exercise    Continue Psychosocial Services  No Follow up required              Vocational Rehabilitation: Provide vocational rehab assistance to qualifying candidates.   Vocational Rehab Evaluation & Intervention:  Vocational Rehab - 09/28/21 1509       Initial Vocational Rehab Evaluation & Intervention   Assessment shows need for Vocational Rehabilitation No      Vocational Rehab Re-Evaulation   Comments Retired Pharmacist, hospital from [place]             Education: Education Goals: Education classes will be provided on a weekly basis, covering required topics. Participant will state understanding/return demonstration of topics presented.  Learning Barriers/Preferences:  Learning Barriers/Preferences - 09/28/21 1509       Learning Barriers/Preferences   Learning Barriers Sight;Hearing    Learning Preferences Individual Instruction;Group Instruction;Written Material             Education Topics: Count Your Pulse:  -Group instruction provided by verbal instruction, demonstration, patient participation and written materials to support subject.  Instructors address importance of being able to find your pulse and how to count your pulse when at home without a heart monitor.  Patients get hands on experience counting their pulse with staff help and individually.   Heart Attack, Angina, and Risk Factor Modification:  -Group instruction provided by verbal instruction, video, and written materials to support subject.  Instructors address signs and symptoms of angina and heart attacks.    Also discuss risk factors for heart disease and how to make changes to improve heart health risk factors.   Functional Fitness:  -Group instruction provided by verbal instruction, demonstration, patient participation, and written materials to support subject.  Instructors address safety measures for doing things around the house.  Discuss how to get up and down off the floor, how to pick things up properly, how to safely get out of a chair without assistance, and balance  training.   Meditation and Mindfulness:  -Group instruction provided by verbal instruction, patient participation, and written materials to support subject.  Instructor addresses importance of mindfulness and meditation practice to help reduce stress and improve awareness.  Instructor also leads participants through a meditation exercise.    Stretching for Flexibility and Mobility:  -  Group instruction provided by verbal instruction, patient participation, and written materials to support subject.  Instructors lead participants through series of stretches that are designed to increase flexibility thus improving mobility.  These stretches are additional exercise for major muscle groups that are typically performed during regular warm up and cool down.   Hands Only CPR:  -Group verbal, video, and participation provides a basic overview of AHA guidelines for community CPR. Role-play of emergencies allow participants the opportunity to practice calling for help and chest compression technique with discussion of AED use.   Hypertension: -Group verbal and written instruction that provides a basic overview of hypertension including the most recent diagnostic guidelines, risk factor reduction with self-care instructions and medication management.    Nutrition I class: Heart Healthy Eating:  -Group instruction provided by PowerPoint slides, verbal discussion, and written materials to support subject matter. The instructor gives an explanation and review of the Therapeutic Lifestyle Changes diet recommendations, which includes a discussion on lipid goals, dietary fat, sodium, fiber, plant stanol/sterol esters, sugar, and the components of a well-balanced, healthy diet.   Nutrition II class: Lifestyle Skills:  -Group instruction provided by PowerPoint slides, verbal discussion, and written materials to support subject matter. The instructor gives an explanation and review of label reading, grocery  shopping for heart health, heart healthy recipe modifications, and ways to make healthier choices when eating out.   Diabetes Question & Answer:  -Group instruction provided by PowerPoint slides, verbal discussion, and written materials to support subject matter. The instructor gives an explanation and review of diabetes co-morbidities, pre- and post-prandial blood glucose goals, pre-exercise blood glucose goals, signs, symptoms, and treatment of hypoglycemia and hyperglycemia, and foot care basics.   Diabetes Blitz:  -Group instruction provided by PowerPoint slides, verbal discussion, and written materials to support subject matter. The instructor gives an explanation and review of the physiology behind type 1 and type 2 diabetes, diabetes medications and rational behind using different medications, pre- and post-prandial blood glucose recommendations and Hemoglobin A1c goals, diabetes diet, and exercise including blood glucose guidelines for exercising safely.    Portion Distortion:  -Group instruction provided by PowerPoint slides, verbal discussion, written materials, and food models to support subject matter. The instructor gives an explanation of serving size versus portion size, changes in portions sizes over the last 20 years, and what consists of a serving from each food group.   Stress Management:  -Group instruction provided by verbal instruction, video, and written materials to support subject matter.  Instructors review role of stress in heart disease and how to cope with stress positively.     Exercising on Your Own:  -Group instruction provided by verbal instruction, power point, and written materials to support subject.  Instructors discuss benefits of exercise, components of exercise, frequency and intensity of exercise, and end points for exercise.  Also discuss use of nitroglycerin and activating EMS.  Review options of places to exercise outside of rehab.  Review guidelines  for sex with heart disease.   Cardiac Drugs I:  -Group instruction provided by verbal instruction and written materials to support subject.  Instructor reviews cardiac drug classes: antiplatelets, anticoagulants, beta blockers, and statins.  Instructor discusses reasons, side effects, and lifestyle considerations for each drug class.   Cardiac Drugs II:  -Group instruction provided by verbal instruction and written materials to support subject.  Instructor reviews cardiac drug classes: angiotensin converting enzyme inhibitors (ACE-I), angiotensin II receptor blockers (ARBs), nitrates, and calcium channel blockers.  Instructor  discusses reasons, side effects, and lifestyle considerations for each drug class.   Anatomy and Physiology of the Circulatory System:  Group verbal and written instruction and models provide basic cardiac anatomy and physiology, with the coronary electrical and arterial systems. Review of: AMI, Angina, Valve disease, Heart Failure, Peripheral Artery Disease, Cardiac Arrhythmia, Pacemakers, and the ICD.   Other Education:  -Group or individual verbal, written, or video instructions that support the educational goals of the cardiac rehab program.   Holiday Eating Survival Tips:  -Group instruction provided by PowerPoint slides, verbal discussion, and written materials to support subject matter. The instructor gives patients tips, tricks, and techniques to help them not only survive but enjoy the holidays despite the onslaught of food that accompanies the holidays.   Knowledge Questionnaire Score:  Knowledge Questionnaire Score - 09/28/21 1407       Knowledge Questionnaire Score   Pre Score 21/24             Core Components/Risk Factors/Patient Goals at Admission:  Personal Goals and Risk Factors at Admission - 09/28/21 1057       Core Components/Risk Factors/Patient Goals on Admission   Lipids Yes    Intervention Provide education and support for  participant on nutrition & aerobic/resistive exercise along with prescribed medications to achieve LDL 70mg , HDL >40mg .    Expected Outcomes Short Term: Participant states understanding of desired cholesterol values and is compliant with medications prescribed. Participant is following exercise prescription and nutrition guidelines.;Long Term: Cholesterol controlled with medications as prescribed, with individualized exercise RX and with personalized nutrition plan. Value goals: LDL < 70mg , HDL > 40 mg.             Core Components/Risk Factors/Patient Goals Review:   Goals and Risk Factor Review     Row Name 10/11/21 1746 11/09/21 1646           Core Components/Risk Factors/Patient Goals Review   Personal Goals Review Lipids Lipids      Review Marissa Lee is off to a good start to exercise for her fitness level. Marissa Lee's vital signs and CBG's have been stable Marissa Lee last day of exercise was on 10/22/21. Marissa Lee has done well with exercise when present. Marissa Lee      Expected Outcomes Marissa Lee will continue to participate in phase 2 CR for exercise, nutrition and life style modificaitons. Marissa Lee will continue to participate in phase 2 CR for exercise, nutrition and life style modificaitons.               Core Components/Risk Factors/Patient Goals at Discharge (Final Review):   Goals and Risk Factor Review - 11/09/21 1646       Core Components/Risk Factors/Patient Goals Review   Personal Goals Review Lipids    Review Nadezhda last day of exercise was on 10/22/21. Marissa Lee has done well with exercise when present. Marissa Lee plans to return to exercise on Lee    Expected Outcomes Marissa Lee will continue to participate in phase 2 CR for exercise, nutrition and life style modificaitons.             ITP Comments:  ITP Comments     Row Name 09/28/21 1054 10/04/21 1209 10/11/21 1746 11/09/21 1642     ITP Comments Medical Director- Dr. Fransico Him, MD 30 Day ITP  Review. Marissa Lee started cardiac rehab on 10/04/21 and did fair with exercise for her fitness level 30 Day ITP Review. Alaya is off to a good start to exercise.  30 Day ITP Review. Taydem's last day of attendance was on 10/22/21. Jessamine has been out with an upper respitory infection. Stephanne will be out of town says she plans to return to exercise on Lee             Comments: See ITP Comments

## 2021-11-12 ENCOUNTER — Encounter (HOSPITAL_COMMUNITY): Payer: Medicare Other

## 2021-11-15 ENCOUNTER — Encounter (HOSPITAL_COMMUNITY)
Admission: RE | Admit: 2021-11-15 | Discharge: 2021-11-15 | Disposition: A | Discharge status: Home / Self Care | Payer: Medicare Other | Source: Ambulatory Visit | Attending: Cardiology | Admitting: Cardiology

## 2021-11-15 ENCOUNTER — Other Ambulatory Visit: Payer: Self-pay

## 2021-11-15 DIAGNOSIS — Z48812 Encounter for surgical aftercare following surgery on the circulatory system: Secondary | ICD-10-CM | POA: Diagnosis not present

## 2021-11-15 DIAGNOSIS — Z951 Presence of aortocoronary bypass graft: Secondary | ICD-10-CM

## 2021-11-15 NOTE — Progress Notes (Addendum)
Marissa Lee returned to exercise today after being absent with a URI. Tolerated exercise without difficulty. Oxygen saturation's 99% on  room air. Marissa Lee worked out on level 1 on the nustep. Will continue to monitor the patient throughout  the program. Marissa Lee's systolic BP's are sometimes in the 90's. Patient asymptomatic.  Given water. Will forward exercise flow sheets from cardiac rehab to Dr Irven Shelling office for review.  Marissa Lee is taking her medications as prescribed.    Barnet Pall, RN,BSN 11/15/2021 11:21 AM

## 2021-11-17 ENCOUNTER — Encounter (HOSPITAL_COMMUNITY)
Admission: RE | Admit: 2021-11-17 | Discharge: 2021-11-17 | Disposition: A | Discharge status: Home / Self Care | Payer: Medicare Other | Source: Ambulatory Visit | Attending: Cardiology | Admitting: Cardiology

## 2021-11-17 ENCOUNTER — Other Ambulatory Visit: Payer: Self-pay | Admitting: Cardiology

## 2021-11-17 ENCOUNTER — Other Ambulatory Visit: Payer: Self-pay

## 2021-11-17 ENCOUNTER — Telehealth: Payer: Self-pay

## 2021-11-17 ENCOUNTER — Encounter: Payer: Self-pay | Admitting: Cardiology

## 2021-11-17 DIAGNOSIS — Z951 Presence of aortocoronary bypass graft: Secondary | ICD-10-CM

## 2021-11-17 DIAGNOSIS — Z48812 Encounter for surgical aftercare following surgery on the circulatory system: Secondary | ICD-10-CM | POA: Diagnosis not present

## 2021-11-17 NOTE — Progress Notes (Signed)
Resting systolic and diastolic BP's remain in the upper 90's today. Patient remains asymptomatic. Today's blood pressures called into Dr Irven Shelling office spoke to [place].Will continue to monitor the patient throughout  the program. Barnet Pall, RN,BSN 11/17/2021 11:27 AM

## 2021-11-17 NOTE — Telephone Encounter (Signed)
I have discontinued Lisinorpil 5 mg due to low BP in rehab

## 2021-11-17 NOTE — Telephone Encounter (Signed)
Please inform patient. I sent her my chart messatge

## 2021-11-18 NOTE — Telephone Encounter (Signed)
Pt aware.

## 2021-11-18 NOTE — Telephone Encounter (Signed)
Tried calling patient no answer left a vm and it looks like patient saw my chart message and responded to him already

## 2021-11-19 ENCOUNTER — Other Ambulatory Visit: Payer: Self-pay | Admitting: Cardiology

## 2021-11-19 ENCOUNTER — Encounter (HOSPITAL_COMMUNITY)
Admission: RE | Admit: 2021-11-19 | Discharge: 2021-11-19 | Disposition: A | Discharge status: Home / Self Care | Payer: Medicare Other | Source: Ambulatory Visit | Attending: Cardiology | Admitting: Cardiology

## 2021-11-19 ENCOUNTER — Other Ambulatory Visit: Payer: Self-pay

## 2021-11-19 DIAGNOSIS — Z951 Presence of aortocoronary bypass graft: Secondary | ICD-10-CM

## 2021-11-19 DIAGNOSIS — Z48812 Encounter for surgical aftercare following surgery on the circulatory system: Secondary | ICD-10-CM | POA: Diagnosis not present

## 2021-11-19 DIAGNOSIS — K219 Gastro-esophageal reflux disease without esophagitis: Secondary | ICD-10-CM

## 2021-11-19 LAB — CMP14+EGFR
ALT: 24 IU/L (ref 0–32)
AST: 24 IU/L (ref 0–40)
Albumin/Globulin Ratio: 1.8 (ref 1.2–2.2)
Albumin: 4.3 g/dL (ref 3.8–4.8)
Alkaline Phosphatase: 62 IU/L (ref 44–121)
BUN/Creatinine Ratio: 13 (ref 12–28)
BUN: 11 mg/dL (ref 8–27)
Bilirubin Total: 0.5 mg/dL (ref 0.0–1.2)
CO2: 25 mmol/L (ref 20–29)
Calcium: 9.6 mg/dL (ref 8.7–10.3)
Chloride: 105 mmol/L (ref 96–106)
Creatinine, Ser: 0.84 mg/dL (ref 0.57–1.00)
Globulin, Total: 2.4 g/dL (ref 1.5–4.5)
Glucose: 94 mg/dL (ref 70–99)
Potassium: 4.8 mmol/L (ref 3.5–5.2)
Sodium: 143 mmol/L (ref 134–144)
Total Protein: 6.7 g/dL (ref 6.0–8.5)
eGFR: 75 mL/min/{1.73_m2} (ref >59)

## 2021-11-19 LAB — CBC
Hematocrit: 39.1 % (ref 34.0–46.6)
Hemoglobin: 12.6 g/dL (ref 11.1–15.9)
MCH: 29.2 pg (ref 26.6–33.0)
MCHC: 32.2 g/dL (ref 31.5–35.7)
MCV: 91 fL (ref 79–97)
Platelets: 381 10*3/uL (ref 150–450)
RBC: 4.32 x10E6/uL (ref 3.77–5.28)
RDW: 13.4 % (ref 11.7–15.4)
WBC: 6.5 10*3/uL (ref 3.4–10.8)

## 2021-11-19 LAB — LIPID PANEL WITH LDL/HDL RATIO
Cholesterol, Total: 156 mg/dL (ref 100–199)
HDL: 59 mg/dL (ref >39)
LDL Chol Calc (NIH): 80 mg/dL (ref 0–99)
LDL/HDL Ratio: 1.4 ratio (ref 0.0–3.2)
Triglycerides: 92 mg/dL (ref 0–149)
VLDL Cholesterol Cal: 17 mg/dL (ref 5–40)

## 2021-11-19 LAB — HIGH SENSITIVITY CRP: CRP, High Sensitivity: 0.46 mg/L (ref 0.00–3.00)

## 2021-11-19 NOTE — Progress Notes (Signed)
FYI

## 2021-11-20 NOTE — Progress Notes (Signed)
Noted, thanks!

## 2021-11-22 ENCOUNTER — Other Ambulatory Visit: Payer: Self-pay

## 2021-11-22 ENCOUNTER — Encounter (HOSPITAL_COMMUNITY)
Admission: RE | Admit: 2021-11-22 | Discharge: 2021-11-22 | Disposition: A | Discharge status: Home / Self Care | Payer: Medicare Other | Source: Ambulatory Visit | Attending: Cardiology | Admitting: Cardiology

## 2021-11-22 DIAGNOSIS — Z951 Presence of aortocoronary bypass graft: Secondary | ICD-10-CM

## 2021-11-22 DIAGNOSIS — Z48812 Encounter for surgical aftercare following surgery on the circulatory system: Secondary | ICD-10-CM | POA: Diagnosis not present

## 2021-11-24 ENCOUNTER — Encounter (HOSPITAL_COMMUNITY)
Admission: RE | Admit: 2021-11-24 | Discharge: 2021-11-24 | Disposition: A | Discharge status: Home / Self Care | Payer: Medicare Other | Source: Ambulatory Visit | Attending: Cardiology | Admitting: Cardiology

## 2021-11-24 ENCOUNTER — Other Ambulatory Visit: Payer: Self-pay

## 2021-11-24 DIAGNOSIS — Z951 Presence of aortocoronary bypass graft: Secondary | ICD-10-CM | POA: Diagnosis not present

## 2021-11-25 ENCOUNTER — Encounter: Payer: Self-pay | Admitting: Cardiology

## 2021-11-25 ENCOUNTER — Ambulatory Visit: Payer: Medicare Other | Admitting: Cardiology

## 2021-11-25 VITALS — BP 113/63 | HR 68 | Temp 98.0°F | Resp 16 | Ht 64.0 in | Wt 135.0 lb

## 2021-11-25 DIAGNOSIS — E7800 Pure hypercholesterolemia, unspecified: Secondary | ICD-10-CM

## 2021-11-25 DIAGNOSIS — I2510 Atherosclerotic heart disease of native coronary artery without angina pectoris: Secondary | ICD-10-CM

## 2021-11-25 DIAGNOSIS — E7849 Other hyperlipidemia: Secondary | ICD-10-CM

## 2021-11-25 DIAGNOSIS — R058 Other specified cough: Secondary | ICD-10-CM

## 2021-11-25 MED ORDER — BENZONATATE 100 MG PO CAPS: 100.0000 mg | ORAL_CAPSULE | Freq: Three times a day (TID) | ORAL | 1 refills | Status: DC | PRN

## 2021-11-25 NOTE — Progress Notes (Signed)
Primary Physician/Referring:  Allwardt, Randa Evens, PA-C  Patient ID: Marissa Lee, female    DOB: 1951-09-21, 71 y.o.   MRN: [id number]  Chief Complaint  Patient presents with   Coronary Artery Disease   Hyperlipidemia   Results   Chest Pain   Cough    HPI:    Marissa Lee  is a 71 y.o. Caucasian female patient with no significant prior cardiovascular history, hypercholesterolemia presented on 07/27/2021 with unstable angina with substernal chest pain with radiation to the back, arm and jaw, cardiac catheterization following morning revealed 90% ostial left main and mid left main disease for which he underwent emergent CABG on 07/28/2021 with LIMA to LAD and SVG to OM1.  I had received a call from cardiac rehab stating her blood pressure is low however patient is asymptomatic.  I had discontinued lisinopril a week ago.  However patient is also having dry cough.  Otherwise no chest pain, dyspnea has improved significantly and she is presently in cardiac rehab.  Past Medical History:  Diagnosis Date   Anxiety    Endometriosis    Fibrocystic breast    muliple drainage   Fibroid    Hiatal hernia    History of bronchitis    Hypercholesteremia 2007   currently not taking anything for it   Osteopenia 08/2016   hip and spine   Subclinical hypothyroidism 10/04/2018   Urge incontinence 06/05/2013   Social History   Tobacco Use   Smoking status: Former    Packs/day: 1.00    Years: 20.00    Pack years: 20.00    Types: Cigarettes    Quit date: 2000    Years since quitting: 23.1   Smokeless tobacco: Never   Tobacco comments:    quit in approximately 2000  Substance Use Topics   Alcohol use: Yes    Alcohol/week: 3.0 - 4.0 standard drinks    Types: 2 Cans of beer, 1 - 2 Standard drinks or equivalent per week    Comment: rarely   Marital Status: Single  ROS  Review of Systems  Cardiovascular:  Positive for dyspnea on exertion. Negative for chest pain and leg swelling.   Respiratory:  Positive for cough.   Gastrointestinal:  Negative for melena.  Objective  Blood pressure 113/63, pulse 68, temperature 98 F (36.7 C), temperature source Temporal, resp. rate 16, height 5\' 4"  (1.626 m), weight 135 lb (61.2 kg), last menstrual period 09/27/2003, SpO2 95 %. Body mass index is 23.17 kg/m.  Vitals with BMI 11/25/2021 09/28/2021 09/16/2021  Height 5\' 4"  5\' 4"  5\' 4"   Weight 135 lbs 131 lbs 13 oz 133 lbs  BMI 23.16 60.15 61.53  Systolic 794 327 614  Diastolic 63 60 62  Pulse 68 72 71     Physical Exam Neck:     Vascular: No JVD.  Cardiovascular:     Rate and Rhythm: Normal rate and regular rhythm.     Pulses: Intact distal pulses.     Heart sounds: Normal heart sounds. No murmur heard.   No gallop.  Pulmonary:     Effort: Pulmonary effort is normal.     Breath sounds: Normal breath sounds.  Abdominal:     General: Bowel sounds are normal.     Palpations: Abdomen is soft.  Musculoskeletal:     Right lower leg: No edema.     Left lower leg: No edema.     Laboratory examination:   Recent Labs    07/30/21 0356 07/31/21  6644 08/03/21 0309 09/07/21 1032 11/18/21 1037  NA 136 135 140 140 143  K 3.7 5.0 4.0 4.7 4.8  CL 108 107 107 104 105  CO2 24 25 25 28 25   GLUCOSE 119* 102* 107* 92 94  BUN 7* 9 9 11 11   CREATININE 0.79 0.76 0.73 0.84 0.84  CALCIUM 8.1* 8.2* 8.5* 9.6 9.6  GFRNONAA >60 >60 >60  --   --    estimated creatinine clearance is 54.6 mL/min (by C-G formula based on SCr of 0.84 mg/dL).  CMP Latest Ref Rng & Units 11/18/2021 09/07/2021 08/03/2021  Glucose 70 - 99 mg/dL 94 92 107(H)  BUN 8 - 27 mg/dL 11 11 9   Creatinine 0.57 - 1.00 mg/dL 0.84 0.84 0.73  Sodium 134 - 144 mmol/L 143 140 140  Potassium 3.5 - 5.2 mmol/L 4.8 4.7 4.0  Chloride 96 - 106 mmol/L 105 104 107  CO2 20 - 29 mmol/L 25 28 25   Calcium 8.7 - 10.3 mg/dL 9.6 9.6 8.5(L)  Total Protein 6.0 - 8.5 g/dL 6.7 7.1 -  Total Bilirubin 0.0 - 1.2 mg/dL 0.5 0.5 -  Alkaline Phos 44  - 121 IU/L 62 62 -  AST 0 - 40 IU/L 24 17 -  ALT 0 - 32 IU/L 24 16 -   CBC Latest Ref Rng & Units 11/18/2021 09/07/2021 08/03/2021  WBC 3.4 - 10.8 x10E3/uL 6.5 5.6 7.3  Hemoglobin 11.1 - 15.9 g/dL 12.6 12.0 7.9(L)  Hematocrit 34.0 - 46.6 % 39.1 37.2 24.5(L)  Platelets 150 - 450 x10E3/uL 381 404.0(H) 327    Lipid Panel Recent Labs    07/28/21 0238 11/18/21 1037  CHOL 236* 156  TRIG 144 92  LDLCALC 158* 80  VLDL 29  --   HDL 49 59  CHOLHDL 4.8  --   Lipid profile 10/04/2018: Total cholesterol 398, triglycerides 171, HDL 55, LDL 218.  HEMOGLOBIN A1C Lab Results  Component Value Date   HGBA1C 5.2 07/28/2021   MPG 102.54 07/28/2021   TSH Recent Labs    07/27/21 1948  TSH 3.643   Medications and allergies   Allergies  Allergen Reactions   Cephalosporins Anaphylaxis and Swelling    Throat swells   Clindamycin/Lincomycin     Metallic taste, blisters behind ears   Codeine Itching    Medication list after today's encounter    Current Outpatient Medications:    aspirin EC 81 MG EC tablet, Take 1 tablet (81 mg total) by mouth daily. Swallow whole., Disp: 30 tablet, Rfl: 11   atorvastatin (LIPITOR) 40 MG tablet, Take 1 tablet (40 mg total) by mouth daily., Disp: 90 tablet, Rfl: 3   benzonatate (TESSALON PERLES) 100 MG capsule, Take 1 capsule (100 mg total) by mouth 3 (three) times daily as needed for cough., Disp: 30 capsule, Rfl: 1   Calcium-Phosphorus-Vitamin D (CITRACAL +D3 PO), Take 1 tablet by mouth daily., Disp: , Rfl:    clopidogrel (PLAVIX) 75 MG tablet, Take 1 tablet (75 mg total) by mouth daily., Disp: 90 tablet, Rfl: 3   Dextrose, Diabetic Use, (GLUCOSE PO), Take 1 tablet by mouth daily as needed (hypoglycemic episodes)., Disp: , Rfl:    ezetimibe (ZETIA) 10 MG tablet, Take 10 mg by mouth daily., Disp: , Rfl:    FLUoxetine (PROZAC) 40 MG capsule, TAKE ONE CAPSULE BY MOUTH EVERY DAY, Disp: 90 capsule, Rfl: 3   metoprolol succinate (TOPROL-XL) 25 MG 24 hr tablet,  Take 0.5 tablets (12.5 mg total) by mouth daily., Disp: 60  tablet, Rfl: 1   nitroGLYCERIN (NITROSTAT) 0.4 MG SL tablet, Place 1 tablet (0.4 mg total) under the tongue every 5 (five) minutes as needed for up to 25 days for chest pain., Disp: 25 tablet, Rfl: 3   pantoprazole (PROTONIX) 40 MG tablet, TAKE 1 TABLET(40 MG) BY MOUTH DAILY BEFORE AND BREAKFAST, Disp: 30 tablet, Rfl: 2   Radiology:   Chest x-ray 08/05/2021: Unchanged cardiomediastinal silhouette. Prior median sternotomy and CABG. There are small bilateral pleural effusions and bibasilar opacities, likely atelectasis. Obliquely oriented bandlike opacity in the right lung, similar to prior exam, likely scarring or atelectasis. No visible pneumothorax. No acute osseous abnormality.  Cardiac Studies:   CABG for critical left main disease 07/28/2021: LIMA to LAD and SVG to OM1  Coronary angiogram 07/28/2021: 1. Severe Left Main CAD-ostial 90% with catheter damping and no resolution with IC nitro ? Excellent LAD, diagonal and LCx targets 2. Angiographically normal Right Coronary Artery with RPDA and PL system. 3. Normal/preserved LVEF with normal EDP. 4. Successful placement of 50 cc IABP pump via RFA 8 French sheath  Echocardiogram 07/28/2021:  1. Left ventricular ejection fraction, by estimation, is 60 to 65%. The left ventricle has normal function. The left ventricle has no regional wall motion abnormalities. Left ventricular diastolic parameters are indeterminate.  2. Right ventricular systolic function is normal. The right ventricular size is normal. There is normal pulmonary artery systolic pressure.  3. Left atrial size was mildly dilated.  EKG:   EKG 11/25/2021: Normal sinus rhythm at rate of 66 bpm, leftward axis, incomplete right bundle branch block.  Poor R wave progression, cannot exclude anteroseptal infarct old.  Nonspecific T abnormality.  No significant change from 08/12/2021.  Assessment     ICD-10-CM   1.  Coronary artery disease involving native coronary artery of native heart without angina pectoris  I25.10 EKG 12-Lead    2. Pure hypercholesterolemia  E78.00     3. Familial hyperlipidemia  E78.49     4. Dry cough  R05.8 benzonatate (TESSALON PERLES) 100 MG capsule       Medications Discontinued During This Encounter  Medication Reason   lisinopril (ZESTRIL) 5 MG tablet Side effect (s)     Meds ordered this encounter  Medications   benzonatate (TESSALON PERLES) 100 MG capsule    Sig: Take 1 capsule (100 mg total) by mouth 3 (three) times daily as needed for cough.    Dispense:  30 capsule    Refill:  1    Orders Placed This Encounter  Procedures   EKG 12-Lead   Recommendations:   Marissa Lee is a 71 y.o. Caucasian female patient with no significant prior cardiovascular history, hypercholesterolemia presented on 07/27/2021 with unstable angina with substernal chest pain with radiation to the back, arm and jaw, cardiac catheterization following morning revealed 90% ostial left main and mid left main disease for which he underwent emergent CABG on 07/28/2021 with LIMA to LAD and SVG to OM1.  I had received a call from cardiac rehab stating her blood pressure is low however patient is asymptomatic.  I had discontinued lisinopril a week ago.  However patient is also having dry cough, I do not know whether this is related to lisinopril that she stopped a week ago or she also has significant GERD.  Prescribed her Tessalon drops for now and hopefully this will resolve, advised her to also use Pepcid along with Protonix for the next 1 week to 10 days.  No clinical evidence of  heart failure.  Although blood pressure is soft, she is completely asymptomatic with regard to this.  She is presently on a low-dose of beta-blocker therapy as well in view of coronary disease, continue the same.  She will continue dual antiplatelet therapy for a period of 6 months since CABG.  I would like to see her back  in 6 months.  Hyperlipidemia discussed, LDL markedly elevated with total cholesterol being >300, suspect familial hyperlipidemia.  However she has had excellent response to combination of 40 mg of atorvastatin along with 10 mg of Zetia.  However the goal LDL is <55 in view of significant CAD and unstable angina presentation and left main disease.  I discussed with her regarding PCSK9 inhibitors versus going on prevail-ASCVD study using Cholestryl Esther transfer protein inhibitor Obecertrapib 10 mg daily.  Information regarding this given to the patient.  I will see her back in 6 months.     Adrian Prows, MD, [facility] 11/25/2021, 11:59 AM Office: [phone]

## 2021-11-26 ENCOUNTER — Encounter (HOSPITAL_COMMUNITY)
Admission: RE | Admit: 2021-11-26 | Discharge: 2021-11-26 | Disposition: A | Discharge status: Home / Self Care | Payer: Medicare Other | Source: Ambulatory Visit | Attending: Cardiology | Admitting: Cardiology

## 2021-11-26 ENCOUNTER — Other Ambulatory Visit: Payer: Self-pay

## 2021-11-26 DIAGNOSIS — Z951 Presence of aortocoronary bypass graft: Secondary | ICD-10-CM | POA: Diagnosis not present

## 2021-11-29 ENCOUNTER — Encounter (HOSPITAL_COMMUNITY): Payer: Medicare Other

## 2021-11-29 ENCOUNTER — Telehealth (HOSPITAL_COMMUNITY): Payer: Self-pay | Admitting: Physician Assistant

## 2021-12-01 ENCOUNTER — Other Ambulatory Visit: Payer: Self-pay

## 2021-12-01 ENCOUNTER — Encounter (HOSPITAL_COMMUNITY)
Admission: RE | Admit: 2021-12-01 | Discharge: 2021-12-01 | Disposition: A | Discharge status: Home / Self Care | Payer: Medicare Other | Source: Ambulatory Visit | Attending: Cardiology | Admitting: Cardiology

## 2021-12-01 DIAGNOSIS — Z951 Presence of aortocoronary bypass graft: Secondary | ICD-10-CM

## 2021-12-03 ENCOUNTER — Encounter (HOSPITAL_COMMUNITY)
Admission: RE | Admit: 2021-12-03 | Discharge: 2021-12-03 | Disposition: A | Discharge status: Home / Self Care | Payer: Medicare Other | Source: Ambulatory Visit | Attending: Cardiology | Admitting: Cardiology

## 2021-12-03 ENCOUNTER — Other Ambulatory Visit: Payer: Self-pay

## 2021-12-03 DIAGNOSIS — Z951 Presence of aortocoronary bypass graft: Secondary | ICD-10-CM

## 2021-12-06 ENCOUNTER — Ambulatory Visit: Payer: Medicare Other | Admitting: Physician Assistant

## 2021-12-06 ENCOUNTER — Encounter (HOSPITAL_COMMUNITY)
Admission: RE | Admit: 2021-12-06 | Discharge: 2021-12-06 | Disposition: A | Discharge status: Home / Self Care | Payer: Medicare Other | Source: Ambulatory Visit | Attending: Cardiology | Admitting: Cardiology

## 2021-12-06 ENCOUNTER — Other Ambulatory Visit: Payer: Self-pay

## 2021-12-06 DIAGNOSIS — Z951 Presence of aortocoronary bypass graft: Secondary | ICD-10-CM | POA: Diagnosis not present

## 2021-12-07 ENCOUNTER — Ambulatory Visit: Payer: Medicare Other | Admitting: Physician Assistant

## 2021-12-07 NOTE — Progress Notes (Signed)
Cardiac Individual Treatment Plan ? ?Patient Details  ?Name: Marissa Lee ?MRN: [id number] ?Date of Birth: 1951-06-03 ?Referring Provider:   ?Flowsheet Row CARDIAC REHAB PHASE II ORIENTATION from 09/28/2021 in [facility]  ?Referring Provider Adrian Prows, MD  ? ?  ? ? ?Initial Encounter Date:  ?Flowsheet Row CARDIAC REHAB PHASE II ORIENTATION from 09/28/2021 in [facility]  ?Date 09/28/21  ? ?  ? ? 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 ?Patient's Home Medications on Admission: ? ?Current Outpatient Medications:  ?  aspirin EC 81 MG EC tablet, Take 1 tablet (81 mg total) by mouth daily. Swallow whole., Disp: 30 tablet, Rfl: 11 ?  atorvastatin (LIPITOR) 40 MG tablet, Take 1 tablet (40 mg total) by mouth daily., Disp: 90 tablet, Rfl: 3 ?  benzonatate (TESSALON PERLES) 100 MG capsule, Take 1 capsule (100 mg total) by mouth 3 (three) times daily as needed for cough., Disp: 30 capsule, Rfl: 1 ?  Calcium-Phosphorus-Vitamin D (CITRACAL +D3 PO), Take 1 tablet by mouth daily., Disp: , Rfl:  ?  clopidogrel (PLAVIX) 75 MG tablet, Take 1 tablet (75 mg total) by mouth daily., Disp: 90 tablet, Rfl: 3 ?  Dextrose, Diabetic Use, (GLUCOSE PO), Take 1 tablet by mouth daily as needed (hypoglycemic episodes)., Disp: , Rfl:  ?  ezetimibe (ZETIA) 10 MG tablet, Take 10 mg by mouth daily., Disp: , Rfl:  ?  FLUoxetine (PROZAC) 40 MG capsule, TAKE ONE CAPSULE BY MOUTH EVERY DAY, Disp: 90 capsule, Rfl: 3 ?  metoprolol succinate (TOPROL-XL) 25 MG 24 hr tablet, Take 0.5 tablets (12.5 mg total) by mouth daily., Disp: 60 tablet, Rfl: 1 ?  nitroGLYCERIN (NITROSTAT) 0.4 MG SL tablet, Place 1 tablet (0.4 mg total) under the tongue every 5 (five) minutes as needed for up to 25 days for chest pain., Disp: 25 tablet, Rfl: 3 ?  pantoprazole (PROTONIX) 40 MG tablet, TAKE 1 TABLET(40 MG) BY MOUTH DAILY BEFORE AND BREAKFAST, Disp: 30 tablet, Rfl: 2 ? ?Past Medical History: ?Past Medical History:   ?Diagnosis Date  ? Anxiety   ? Endometriosis   ? Fibrocystic breast   ? muliple drainage  ? Fibroid   ? Hiatal hernia   ? History of bronchitis   ? Hypercholesteremia 2007  ? currently not taking anything for it  ? Osteopenia 08/2016  ? hip and spine  ? Subclinical hypothyroidism 10/04/2018  ? Urge incontinence 06/05/2013  ? ? ?Tobacco Use: ?Social History  ? ?Tobacco Use  ?Smoking Status Former  ? Packs/day: 1.00  ? Years: 20.00  ? Pack years: 20.00  ? Types: Cigarettes  ? Quit date: 2000  ? Years since quitting: 23.2  ?Smokeless Tobacco Never  ?Tobacco Comments  ? quit in approximately 2000  ? ? ?Labs: ?Recent Review Flowsheet Data   ? ? Labs for ITP Cardiac and Pulmonary Rehab Latest Ref Rng & Units 07/29/2021 07/29/2021 07/29/2021 07/29/2021 11/18/2021  ? Cholestrol 100 - 199 mg/dL - - - - 156  ? LDLCALC 0 - 99 mg/dL - - - - 80  ? HDL >39 mg/dL - - - - 59  ? Trlycerides 0 - 149 mg/dL - - - - 92  ? Hemoglobin A1c 4.8 - 5.6 % - - - - -  ? PHART 7.350 - 7.450 7.240(L) 7.250(L) 7.269(L) 7.309(L) -  ? PCO2ART 32.0 - 48.0 mmHg 50.6(H) 51.8(H) 47.1 48.8(H) -  ? HCO3 20.0 - 28.0 mmol/L 21.6 22.7 21.5 24.5 -  ?  TCO2 22 - 32 mmol/L '23 24 23 26 '$ -  ? ACIDBASEDEF 0.0 - 2.0 mmol/L 5.0(H) 4.0(H) 5.0(H) 2.0 -  ? O2SAT % 98.0 97.0 99.0 98.0 -  ? ?  ? ? ?Capillary Blood Glucose: ?Lab Results  ?Component Value Date  ? GLUCAP 95 07/31/2021  ? GLUCAP 89 07/31/2021  ? GLUCAP 109 (H) 07/31/2021  ? GLUCAP 114 (H) 07/31/2021  ? GLUCAP 85 07/30/2021  ? ? ? ?Exercise Target Goals: ?Exercise Program Goal: ?Individual exercise prescription set using results from initial 6 min walk test and THRR while considering  patient?s activity barriers and safety.  ? ?Exercise Prescription Goal: ?Initial exercise prescription builds to 30-45 minutes a day of aerobic activity, 2-3 days per week.  Home exercise guidelines will be given to patient during program as part of exercise prescription that the participant will acknowledge. ? ?Activity Barriers & Risk  Stratification: ? Activity Barriers & Cardiac Risk Stratification - 09/28/21 1157   ? ?  ? Activity Barriers & Cardiac Risk Stratification  ? Activity Barriers Other (comment);Back Problems   ? Comments Three broken fingers on right hand in 2017 affects grip.   ? Cardiac Risk Stratification High   ? ?  ?  ? ?  ? ? ?6 Minute Walk: ? 6 Minute Walk   ? ? [place] Name 09/28/21 1213  ?  ?  ?  ? 6 Minute Walk  ? Phase Initial    ? Distance 1340 feet    ? Walk Time 6 minutes    ? # of Rest Breaks 0    ? MPH 2.54    ? METS 3.01    ? RPE 13    ? Perceived Dyspnea  1.5    ? VO2 Peak 10.55    ? Symptoms Yes (comment)    ? Comments Mild SOB    ? Resting HR 72 bpm    ? Resting BP 112/60    ? Resting Oxygen Saturation  100 %    ? Exercise Oxygen Saturation  during 6 min walk 100 %    ? Max Ex. HR 87 bpm    ? Max Ex. BP 118/62    ? 2 Minute Post BP 122/72    ? ?  ?  ? ?  ? ? ?Oxygen Initial Assessment: ? ? ?Oxygen Re-Evaluation: ? ? ?Oxygen Discharge (Final Oxygen Re-Evaluation): ? ? ?Initial Exercise Prescription: ? Initial Exercise Prescription - 09/28/21 1200   ? ?  ? Date of Initial Exercise RX and Referring Provider  ? Date 09/28/21   ? Referring Provider Adrian Prows, MD   ? Expected Discharge Date 11/26/21   ?  ? Recumbant Bike  ? Level 1.5   ? Minutes 15   ? METs 2.5   ?  ? NuStep  ? Level 2   ? SPM 85   ? Minutes 15   ? METs 2.5   ?  ? Prescription Details  ? Frequency (times per week) 3   ? Duration Progress to 30 minutes of continuous aerobic without signs/symptoms of physical distress   ?  ? Intensity  ? THRR 40-80% of Max Heartrate 60-121   ? Ratings of Perceived Exertion 11-13   ? Perceived Dyspnea 0-4   ?  ? Progression  ? Progression Continue to progress workloads to maintain intensity without signs/symptoms of physical distress.   ?  ? Resistance Training  ? Training Prescription Yes   ? Weight 2 lbs   ?  Reps 10-15   ? ?  ?  ? ?  ? ? ?Perform Capillary Blood Glucose checks as needed. ? ?Exercise Prescription  Changes: ? ? Exercise Prescription Changes   ? ? [place] Name 10/04/21 1045 10/11/21 1023 10/25/21 1041 11/15/21 1030 11/22/21 1030  ?  ? Response to Exercise  ? Blood Pressure (Admit) 114/64 112/70 112/76 98/70 102/64  ? Blood Pressure (Exercise) 138/72 108/76 154/78 112/70 118/60  ? Blood Pressure (Exit) 110/68 101/68 104/70 112/59 96/64  ? Heart Rate (Admit) 69 bpm 83 bpm 67 bpm 76 bpm 70 bpm  ? Heart Rate (Exercise) 82 bpm 98 bpm 89 bpm 89 bpm 88 bpm  ? Heart Rate (Exit) 69 bpm 78 bpm 72 bpm 70 bpm 67 bpm  ? Rating of Perceived Exertion (Exercise) '14 13 12 13 11  '$ ? Symptoms None None None None None  ? Comments Recumbent bike too difficult, switched to track second station. -- -- -- --  ? Duration Progress to 30 minutes of  aerobic without signs/symptoms of physical distress Progress to 30 minutes of  aerobic without signs/symptoms of physical distress Progress to 30 minutes of  aerobic without signs/symptoms of physical distress Progress to 30 minutes of  aerobic without signs/symptoms of physical distress Progress to 30 minutes of  aerobic without signs/symptoms of physical distress  ? Intensity THRR unchanged THRR unchanged THRR unchanged THRR unchanged THRR unchanged  ?  ? Progression  ? Progression Continue to progress workloads to maintain intensity without signs/symptoms of physical distress. Continue to progress workloads to maintain intensity without signs/symptoms of physical distress. Continue to progress workloads to maintain intensity without signs/symptoms of physical distress. Continue to progress workloads to maintain intensity without signs/symptoms of physical distress. Continue to progress workloads to maintain intensity without signs/symptoms of physical distress.  ? Average METs 1.5 2.3 1.9 2.5 2.7  ?  ? Resistance Training  ? Training Prescription Yes Yes Yes Yes Yes  ? Weight 2 lbs 2 lbs 2 lbs 3 lbs 3 lbs  ? Reps 10-15 10-15 10-15 10-15 10-15  ? Time 10 Minutes 10 Minutes 10 Minutes 10 Minutes  10 Minutes  ?  ? Interval Training  ? Interval Training No No No No No  ?  ? Recumbant Bike  ? Level 1.5 -- -- -- --  ? Minutes 4 -- -- -- --  ? METs 1.4 -- -- -- --  ?  ? NuStep  ? Level 1 2  INC WL from 1 to 2 at 8 min

## 2021-12-08 ENCOUNTER — Other Ambulatory Visit: Payer: Self-pay

## 2021-12-08 ENCOUNTER — Encounter (HOSPITAL_COMMUNITY)
Admission: RE | Admit: 2021-12-08 | Discharge: 2021-12-08 | Disposition: A | Discharge status: Home / Self Care | Payer: Medicare Other | Source: Ambulatory Visit | Attending: Cardiology | Admitting: Cardiology

## 2021-12-08 DIAGNOSIS — Z951 Presence of aortocoronary bypass graft: Secondary | ICD-10-CM | POA: Diagnosis not present

## 2021-12-10 ENCOUNTER — Other Ambulatory Visit: Payer: Self-pay

## 2021-12-10 ENCOUNTER — Encounter (HOSPITAL_COMMUNITY)
Admission: RE | Admit: 2021-12-10 | Discharge: 2021-12-10 | Disposition: A | Discharge status: Home / Self Care | Payer: Medicare Other | Source: Ambulatory Visit | Attending: Cardiology | Admitting: Cardiology

## 2021-12-10 DIAGNOSIS — Z951 Presence of aortocoronary bypass graft: Secondary | ICD-10-CM | POA: Diagnosis not present

## 2021-12-13 ENCOUNTER — Other Ambulatory Visit: Payer: Self-pay

## 2021-12-13 ENCOUNTER — Encounter: Payer: Self-pay | Admitting: Physician Assistant

## 2021-12-13 ENCOUNTER — Other Ambulatory Visit: Payer: Self-pay | Admitting: Physician Assistant

## 2021-12-13 ENCOUNTER — Encounter (HOSPITAL_COMMUNITY)
Admission: RE | Admit: 2021-12-13 | Discharge: 2021-12-13 | Disposition: A | Discharge status: Home / Self Care | Payer: Medicare Other | Source: Ambulatory Visit | Attending: Cardiology | Admitting: Cardiology

## 2021-12-13 ENCOUNTER — Ambulatory Visit: Payer: Medicare Other | Admitting: Physician Assistant

## 2021-12-13 VITALS — BP 118/66 | HR 62 | Temp 97.8°F | Ht 64.0 in | Wt 136.4 lb

## 2021-12-13 DIAGNOSIS — R350 Frequency of micturition: Secondary | ICD-10-CM | POA: Diagnosis not present

## 2021-12-13 DIAGNOSIS — R739 Hyperglycemia, unspecified: Secondary | ICD-10-CM

## 2021-12-13 DIAGNOSIS — Z951 Presence of aortocoronary bypass graft: Secondary | ICD-10-CM | POA: Diagnosis not present

## 2021-12-13 LAB — POC URINALSYSI DIPSTICK (AUTOMATED)
Bilirubin, UA: NEGATIVE
Blood, UA: NEGATIVE
Glucose, UA: NEGATIVE
Ketones, UA: NEGATIVE
Nitrite, UA: NEGATIVE
Protein, UA: NEGATIVE
Spec Grav, UA: 1.01 (ref 1.010–1.025)
Urobilinogen, UA: 0.2 E.U./dL
pH, UA: 6 (ref 5.0–8.0)

## 2021-12-13 MED ORDER — NITROFURANTOIN MONOHYD MACRO 100 MG PO CAPS: 100.0000 mg | ORAL_CAPSULE | Freq: Two times a day (BID) | ORAL | 0 refills | Status: AC

## 2021-12-13 NOTE — Progress Notes (Signed)
? ?Subjective:  ? ? Patient ID: Marissa Lee, female    DOB: Mar 31, 1951, 71 y.o.   MRN: [id number] ? ?Chief Complaint  ?Patient presents with  ? Burning Urination  ? Follow-up  ? ? ?HPI ?Patient is in today for f/up from 09/07/21.  ? ?Cardiac rehab s/p CABGx2 -in the final two weeks. Doing well and seeing improvements. Unsure about walking outside on uneven surfaces at this time.  ? ?Blood pressure running low, occasionally having "slumps", has been taken off several anti-HTN medications so far. Also wondering about possible diabetes.  Patient says that she has had longstanding history of elevated glucose intermittently, but her hemoglobin A1c has always been normal.  Patient states that she keeps dextrose tablets on her at all times and has occasionally needed to take 1 when she gets in one of the slumps and this seems to help. ? ?Also thinks she might have UTI. Usually at least 1-2 times per year.  ?Dysuria, frequency x 2-3 weeks. Kept trying to push fluids and still hasn't resolved yet.  ? ?No other symptoms or concerns today. ? ?Past Medical History:  ?Diagnosis Date  ? Anxiety   ? Endometriosis   ? Fibrocystic breast   ? muliple drainage  ? Fibroid   ? Hiatal hernia   ? History of bronchitis   ? Hypercholesteremia 2007  ? currently not taking anything for it  ? Osteopenia 08/2016  ? hip and spine  ? Subclinical hypothyroidism 10/04/2018  ? Urge incontinence 06/05/2013  ? ? ?Past Surgical History:  ?Procedure Laterality Date  ? ABDOMINAL HYSTERECTOMY    ? BLADDER SUSPENSION  2004  ? done with [facility]  ? BREAST BIOPSY  1980  ? done 3x  ? CLOSED REDUCTION METACARPAL WITH PERCUTANEOUS PINNING Right 08/24/2016  ? Procedure: CLOSED REDUCTION  WITH PERCUTANEOUS PINNING;  Surgeon: Iran Planas, MD;  Location: [facility];  Service: Orthopedics;  Laterality: Right;  ? COLONOSCOPY    ? COMBINED HYSTERECTOMY VAGINAL / [place] / A&P REPAIR  2004  ? CORONARY ARTERY BYPASS GRAFT N/A 07/28/2021  ? Procedure: CORONARY ARTERY BYPASS  GRAFTING (CABG) times two using left internal mammary artery and  left leg saphenous vein;  Surgeon: [name], MD;  Location: [facility];  Service: Open Heart Surgery;  Laterality: N/A;  ? FOOT SURGERY Right 1992  ? IABP INSERTION Right 07/28/2021  ? Procedure: IABP Insertion;  Surgeon: Leonie Man, MD;  Location: [facility];  Service: Cardiovascular;  Laterality: Right;  ? LEFT HEART CATH AND CORONARY ANGIOGRAPHY N/A 07/28/2021  ? Procedure: LEFT HEART CATH AND CORONARY ANGIOGRAPHY;  Surgeon: Leonie Man, MD;  Location: [facility];  Service: Cardiovascular;  Laterality: N/A;  ? OPEN REDUCTION INTERNAL FIXATION (ORIF) HAND Right 08/24/2016  ? Procedure: RIGHT HAND LONG RING AND SMALL FINGER  POSSIBLE ORIF;  Surgeon: Iran Planas, MD;  Location: [facility];  Service: Orthopedics;  Laterality: Right;  ? [place]  ? ? ?Family History  ?Problem Relation Age of Onset  ? Cancer Paternal Grandmother   ?     Colon   ? Cancer Paternal Grandfather   ?     Lung  ? Heart disease Mother   ?     valvular disease, valve replacement  ? Atrial fibrillation Mother   ? Stroke Mother   ? Heart disease Brother   ?     valve disease, valve surgery  ? Thyroid disease Maternal Grandmother   ?  Goiter  ? Thyroid disease Daughter   ?     Hypothyroid  ? ? ?Social History  ? ?Tobacco Use  ? Smoking status: Former  ?  Packs/day: 1.00  ?  Years: 20.00  ?  Pack years: 20.00  ?  Types: Cigarettes  ?  Quit date: 2000  ?  Years since quitting: 23.2  ? Smokeless tobacco: Never  ? Tobacco comments:  ?  quit in approximately 2000  ?Vaping Use  ? Vaping Use: Never used  ?Substance Use Topics  ? Alcohol use: Yes  ?  Alcohol/week: 3.0 - 4.0 standard drinks  ?  Types: 2 Cans of beer, 1 - 2 Standard drinks or equivalent per week  ?  Comment: rarely  ? Drug use: No  ?  ? ?Allergies  ?Allergen Reactions  ? Cephalosporins Anaphylaxis and Swelling  ?  Throat swells  ? Clindamycin/Lincomycin   ?  Metallic taste,  blisters behind ears  ? Codeine Itching  ? Lisinopril Cough  ? ? ?Review of Systems ?NEGATIVE UNLESS OTHERWISE INDICATED IN HPI ? ? ?   ?Objective:  ?  ? ?BP 118/66   Pulse 62   Temp 97.8 ?F (36.6 ?C)   Ht '5\' 4"'$  (1.626 m)   Wt 136 lb 6.1 oz (61.9 kg)   LMP 09/27/2003   SpO2 99%   BMI 23.41 kg/m?  ? ?Wt Readings from Last 3 Encounters:  ?12/13/21 136 lb 6.1 oz (61.9 kg)  ?11/25/21 135 lb (61.2 kg)  ?09/28/21 131 lb 13.4 oz (59.8 kg)  ? ? ?BP Readings from Last 3 Encounters:  ?12/13/21 118/66  ?11/25/21 113/63  ?09/28/21 112/60  ?  ? ?Physical Exam ?Vitals and nursing note reviewed.  ?Constitutional:   ?   Appearance: Normal appearance. She is normal weight. She is not toxic-appearing.  ?HENT:  ?   Head: Normocephalic and atraumatic.  ?Eyes:  ?   Extraocular Movements: Extraocular movements intact.  ?   Conjunctiva/sclera: Conjunctivae normal.  ?   Pupils: Pupils are equal, round, and reactive to light.  ?Cardiovascular:  ?   Rate and Rhythm: Normal rate and regular rhythm.  ?   Pulses: Normal pulses.  ?   Heart sounds: Normal heart sounds.  ?Pulmonary:  ?   Effort: Pulmonary effort is normal.  ?   Breath sounds: Normal breath sounds.  ?Abdominal:  ?   General: Abdomen is flat. Bowel sounds are normal.  ?   Palpations: Abdomen is soft.  ?   Tenderness: There is no right CVA tenderness or left CVA tenderness.  ?Musculoskeletal:     ?   General: Normal range of motion.  ?   Cervical back: Normal range of motion and neck supple.  ?Skin: ?   General: Skin is warm and dry.  ?Neurological:  ?   General: No focal deficit present.  ?   Mental Status: She is alert and oriented to person, place, and time.  ?Psychiatric:     ?   Mood and Affect: Mood normal.     ?   Behavior: Behavior normal.     ?   Thought Content: Thought content normal.     ?   Judgment: Judgment normal.  ? ? ?   ?Assessment & Plan:  ? ?Problem List Items Addressed This Visit   ? ?  ? Other  ? S/P CABG x 2  ? ?Other Visit Diagnoses   ? ? Urinary  frequency    -  Primary  ?  Relevant Orders  ? POCT Urinalysis Dipstick (Automated) (Completed)  ? Hyperglycemia      ? Relevant Orders  ? Fructosamine  ? ?  ? ? ?1. Urinary frequency ?U/A performed in office today. Will send urine for culture and treat with macrobid at this time. Increase water intake. May take AZO for symptomatic relief. Recheck sooner if fever, severe back pain, vomiting, or other acutely worsening symptoms.  ? ?2. Hyperglycemia ?Lab Results  ?Component Value Date  ? HGBA1C 5.2 07/28/2021  ? ?I personally reviewed patient's last hemoglobin A1c as listed above as well as prior fasting glucose levels, which have intermittently been elevated anywhere from 107 to 149.  I talked with patient about trying to obtain a fructosamine level at today's visit to try to get a different read on what her sugars have been doing in the last 2 to 3 weeks since the A1c does not seem to capture her elevated sugars.  Patient agreeable and understanding and we can treat accordingly. ? ?3. S/P CABG x 2 ?Patient is recovering well since 07/28/2021.  She is following up with her cardiologist Dr. Einar Gip.  Currently taking Zetia 10 mg, Plavix 75 mg, aspirin 81 mg, Lipitor 40 mg, Toprol XL 25 mg.  Encouraged her to keep up the good work. ? ? ?Plan for regular follow-up in the next 6 months or as needed. ? ?This note was prepared with assistance of Dragon voice recognition software. Occasional wrong-word or sound-a-like substitutions may have occurred due to the inherent limitations of voice recognition software. ? ? ? ?Michaeal Davis M Jeanne Diefendorf, PA-C ?

## 2021-12-14 ENCOUNTER — Telehealth: Payer: Self-pay | Admitting: Physician Assistant

## 2021-12-14 NOTE — Telephone Encounter (Signed)
Copied from [place] [id number]. Topic: Medicare AWV ?>> Dec 14, 2021 11:39 AM Harris-Coley, Hannah Beat wrote: ?Reason for CRM: Left message for patient to schedule Annual Wellness Visit.  Please schedule with Nurse Health Advisor Charlott Rakes, RN at [facility].  Please call [phone] ask for Juliann Pulse ?

## 2021-12-15 ENCOUNTER — Other Ambulatory Visit: Payer: Self-pay

## 2021-12-15 ENCOUNTER — Encounter (HOSPITAL_COMMUNITY)
Admission: RE | Admit: 2021-12-15 | Discharge: 2021-12-15 | Disposition: A | Discharge status: Home / Self Care | Payer: Medicare Other | Source: Ambulatory Visit | Attending: Cardiology | Admitting: Cardiology

## 2021-12-15 DIAGNOSIS — Z951 Presence of aortocoronary bypass graft: Secondary | ICD-10-CM

## 2021-12-15 LAB — FRUCTOSAMINE: Fructosamine: 246 umol/L (ref 205–285)

## 2021-12-17 ENCOUNTER — Other Ambulatory Visit: Payer: Self-pay

## 2021-12-17 ENCOUNTER — Telehealth: Payer: Self-pay | Admitting: Physician Assistant

## 2021-12-17 ENCOUNTER — Encounter (HOSPITAL_COMMUNITY)
Admission: RE | Admit: 2021-12-17 | Discharge: 2021-12-17 | Disposition: A | Discharge status: Home / Self Care | Payer: Medicare Other | Source: Ambulatory Visit | Attending: Cardiology | Admitting: Cardiology

## 2021-12-17 ENCOUNTER — Other Ambulatory Visit: Payer: Medicare Other

## 2021-12-17 DIAGNOSIS — R8290 Unspecified abnormal findings in urine: Secondary | ICD-10-CM

## 2021-12-17 DIAGNOSIS — Z951 Presence of aortocoronary bypass graft: Secondary | ICD-10-CM

## 2021-12-17 NOTE — Telephone Encounter (Signed)
Patient walked in for urine sample - patient stated she is having a reaction to UTI medication - Stated if Marissa Lee can please prescribe her something else.  ?

## 2021-12-18 LAB — URINE CULTURE
MICRO NUMBER:: 13176796
SPECIMEN QUALITY:: ADEQUATE

## 2021-12-20 ENCOUNTER — Encounter (HOSPITAL_COMMUNITY)
Admission: RE | Admit: 2021-12-20 | Discharge: 2021-12-20 | Disposition: A | Discharge status: Home / Self Care | Payer: Medicare Other | Source: Ambulatory Visit | Attending: Cardiology | Admitting: Cardiology

## 2021-12-20 ENCOUNTER — Other Ambulatory Visit: Payer: Self-pay

## 2021-12-20 DIAGNOSIS — Z951 Presence of aortocoronary bypass graft: Secondary | ICD-10-CM | POA: Diagnosis not present

## 2021-12-22 ENCOUNTER — Encounter (HOSPITAL_COMMUNITY)
Admission: RE | Admit: 2021-12-22 | Discharge: 2021-12-22 | Disposition: A | Discharge status: Home / Self Care | Payer: Medicare Other | Source: Ambulatory Visit | Attending: Cardiology | Admitting: Cardiology

## 2021-12-22 ENCOUNTER — Other Ambulatory Visit: Payer: Self-pay

## 2021-12-22 DIAGNOSIS — Z951 Presence of aortocoronary bypass graft: Secondary | ICD-10-CM

## 2021-12-22 NOTE — Progress Notes (Signed)
Discharge Progress Report ? ?Patient Details  ?Name: Marissa Lee ?MRN: [id number] ?Date of Birth: 1950/11/17 ?Referring Provider:   ?Flowsheet Row CARDIAC REHAB PHASE II ORIENTATION from 09/28/2021 in [facility]  ?Referring Provider Marissa Prows, MD  ? ?  ? ? ? ?Number of Visits: 23 ? ?Reason for Discharge:  ?Patient reached a stable level of exercise. ?Patient has met program and personal goals. ? ?Smoking History:  ?Social History  ? ?Tobacco Use  ?Smoking Status Former  ? Packs/day: 1.00  ? Years: 20.00  ? Pack years: 20.00  ? Types: Cigarettes  ? Quit date: 2000  ? Years since quitting: 23.2  ?Smokeless Tobacco Never  ?Tobacco Comments  ? quit in approximately 2000  ? ? 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 ?ADL UCSD: ? ? ?Initial Exercise Prescription: ? Initial Exercise Prescription - 09/28/21 1200   ? ?  ? Date of Initial Exercise RX and Referring Provider  ? Date 09/28/21   ? Referring Provider Marissa Prows, MD   ? Expected Discharge Date 11/26/21   ?  ? Recumbant Bike  ? Level 1.5   ? Minutes 15   ? METs 2.5   ?  ? NuStep  ? Level 2   ? SPM 85   ? Minutes 15   ? METs 2.5   ?  ? Prescription Details  ? Frequency (times per week) 3   ? Duration Progress to 30 minutes of continuous aerobic without signs/symptoms of physical distress   ?  ? Intensity  ? THRR 40-80% of Max Heartrate 60-121   ? Ratings of Perceived Exertion 11-13   ? Perceived Dyspnea 0-4   ?  ? Progression  ? Progression Continue to progress workloads to maintain intensity without signs/symptoms of physical distress.   ?  ? Resistance Training  ? Training Prescription Yes   ? Weight 2 lbs   ? Reps 10-15   ? ?  ?  ? ?  ? ? ?Discharge Exercise Prescription (Final Exercise Prescription Changes): ? Exercise Prescription Changes - 12/24/21 1029   ? ?  ? Response to Exercise  ? Blood Pressure (Admit) 122/60   ? Blood Pressure (Exercise) 104/58   ? Blood Pressure (Exit) 104/60   ? Heart Rate (Admit) 85 bpm   ? Heart Rate (Exercise)  84 bpm   ? Heart Rate (Exit) 65 bpm   ? Rating of Perceived Exertion (Exercise) 12   ? Symptoms None   ? Comments Last cardiac rehab session.   ? Duration Progress to 30 minutes of  aerobic without signs/symptoms of physical distress   ? Intensity THRR unchanged   ?  ? Progression  ? Progression Continue to progress workloads to maintain intensity without signs/symptoms of physical distress.   ? Average METs 3.3   ?  ? Resistance Training  ? Training Prescription Yes   ? Weight 3 lbs   ? Reps 10-15   ? Time 10 Minutes   ?  ? Interval Training  ? Interval Training No   ?  ? NuStep  ? Level 2   ? SPM 85   ? Minutes 15   ? METs 2.6   ?  ? Track  ? Laps 26   ? Minutes 15   ? METs 4.04   ?  ? Home Exercise Plan  ? Plans to continue exercise at Home (comment)   Walking  ? Frequency Add 2 additional days to program exercise sessions.   ?  Initial Home Exercises Provided 10/22/21   ? ?  ?  ? ?  ? ? ?Functional Capacity: ? 6 Minute Walk   ? ? Marissa Lee Name 09/28/21 1213 12/13/21 1039  ?  ?  ? 6 Minute Walk  ? Phase Initial Discharge   ? Distance 1340 feet 1606 feet   ? Distance % Change -- 19.85 %   ? Distance Feet Change -- 266 ft   ? Walk Time 6 minutes 6 minutes   ? # of Rest Breaks 0 0   ? MPH 2.54 3.04   ? METS 3.01 3.42   ? RPE 13 12   ? Perceived Dyspnea  1.5 0   ? VO2 Peak 10.55 11.97   ? Symptoms Yes (comment) No   ? Comments Mild SOB --   ? Resting HR 72 bpm 64 bpm   ? Resting BP 112/60 110/60   ? Resting Oxygen Saturation  100 % 99 %   ? Exercise Oxygen Saturation  during 6 min walk 100 % 100 %   ? Max Ex. HR 87 bpm 91 bpm   ? Max Ex. BP 118/62 110/60   ? 2 Minute Post BP 122/72 110/60   ? ?  ?  ? ?  ? ? ?Psychological, QOL, Others - Outcomes: ?PHQ 2/9: ? ?  12/22/2021  ? 11:42 AM 09/28/2021  ? 12:30 PM 10/30/2019  ? 10:15 AM 10/04/2018  ? 11:15 AM 09/29/2017  ? 10:50 AM  ?Depression screen PHQ 2/9  ?Decreased Interest 0 0 0 0 0  ?Down, Depressed, Hopeless 0 0 0 0 0  ?PHQ - 2 Score 0 0 0 0 0  ?Altered sleeping  0     ?Tired,  decreased energy  0     ?Change in appetite  0     ?Feeling bad or failure about yourself   0     ?Trouble concentrating  0     ?Moving slowly or fidgety/restless  0     ?Suicidal thoughts  0     ?PHQ-9 Score  0     ?Difficult doing work/chores  Not difficult at all     ? ? ?Quality of Life: ? Quality of Life - 12/22/21 1356   ? ?  ? Quality of Life  ? Select Quality of Life   ?  ? Quality of Life Scores  ? Health/Function Pre 24 %   ? Health/Function Post 22.29 %   ? Health/Function % Change -7.13 %   ? Socioeconomic Pre 27 %   ? Socioeconomic Post 28 %   ? Socioeconomic % Change  3.7 %   ? Psych/Spiritual Pre 27.43 %   ? Psych/Spiritual Post 25.71 %   ? Psych/Spiritual % Change -6.27 %   ? Family Pre 24 %   ? Family Post 27 %   ? Family % Change 12.5 %   ? GLOBAL Pre 25.35 %   ? GLOBAL Post 24.77 %   ? GLOBAL % Change -2.29 %   ? ?  ?  ? ?  ? ? ?Personal Goals: ?Goals established at orientation with interventions provided to work toward goal. ? Personal Goals and Risk Factors at Admission - 09/28/21 1057   ? ?  ? Core Components/Risk Factors/Patient Goals on Admission  ? Lipids Yes   ? Intervention Provide education and support for participant on nutrition & aerobic/resistive exercise along with prescribed medications to achieve LDL <36m, HDL >478m   ? Expected  Outcomes Short Term: Participant states understanding of desired cholesterol values and is compliant with medications prescribed. Participant is following exercise prescription and nutrition guidelines.;Long Term: Cholesterol controlled with medications as prescribed, with individualized exercise RX and with personalized nutrition plan. Value goals: LDL < 59m, HDL > 40 mg.   ? ?  ?  ? ?  ?  ? ?Personal Goals Discharge: ? Goals and Risk Factor Review   ? ? [name] 10/11/21 1746 11/09/21 1646 12/06/21 1731  ?  ?  ?  ? Core Components/Risk Factors/Patient Goals Review  ? Personal Goals Review Lipids Lipids Lipids    ? Review Marissa Lee off to a good start to  exercise for her fitness level. Marissa Lee's vital signs and CBG's have been stable COriyahlast day of exercise was on 10/22/21. CLetanyahas done well with exercise when present. CJosieplans to return to exercise on 11/15/21 CZyannahas good attendance and participation in phase 2 cardiac rehab since her reuturn to exercise. Vital signs havre improved since Dr GEinar Gipadjusted her BP medications. CMadissenreports feeling better.    ? Expected Outcomes CMarkiahwill continue to participate in phase 2 CR for exercise, nutrition and life style modificaitons. CPhillipwill continue to participate in phase 2 CR for exercise, nutrition and life style modificaitons. CIdywill continue to participate in phase 2 CR for exercise, nutrition and life style modificaitons.    ? ?  ?  ? ?  ? ? ?Exercise Goals and Review: ? Exercise Goals   ? ? RPlandome HeightsName 09/28/21 1155  ?  ?  ?  ?  ?  ? Exercise Goals  ? Increase Physical Activity Yes      ? Intervention Provide advice, education, support and counseling about physical activity/exercise needs.;Develop an individualized exercise prescription for aerobic and resistive training based on initial evaluation findings, risk stratification, comorbidities and participant's personal goals.      ? Expected Outcomes Short Term: Attend rehab on a regular basis to increase amount of physical activity.;Long Term: Exercising regularly at least 3-5 days a week.;Long Term: Add in home exercise to make exercise part of routine and to increase amount of physical activity.      ? Increase Strength and Stamina Yes      ? Intervention Provide advice, education, support and counseling about physical activity/exercise needs.;Develop an individualized exercise prescription for aerobic and resistive training based on initial evaluation findings, risk stratification, comorbidities and participant's personal goals.      ? Expected Outcomes Short Term: Increase workloads from initial exercise prescription for resistance, speed, and  METs.;Short Term: Perform resistance training exercises routinely during rehab and add in resistance training at home;Long Term: Improve cardiorespiratory fitness, muscular endurance and strength as meas

## 2021-12-24 ENCOUNTER — Encounter (HOSPITAL_COMMUNITY)
Admission: RE | Admit: 2021-12-24 | Discharge: 2021-12-24 | Disposition: A | Discharge status: Home / Self Care | Payer: Medicare Other | Source: Ambulatory Visit | Attending: Cardiology | Admitting: Cardiology

## 2021-12-24 VITALS — BP 122/60 | HR 85 | Ht 64.0 in | Wt 135.6 lb

## 2021-12-24 DIAGNOSIS — Z951 Presence of aortocoronary bypass graft: Secondary | ICD-10-CM | POA: Diagnosis not present

## 2022-01-19 ENCOUNTER — Telehealth: Payer: Self-pay | Admitting: Physician Assistant

## 2022-01-19 NOTE — Telephone Encounter (Signed)
Copied from [place] [id number]. Topic: Medicare AWV ?>> Jan 19, 2022  2:19 PM Harris-Coley, Hannah Beat wrote: ?Reason for CRM: Left message for patient to schedule Annual Wellness Visit.  Please schedule with Nurse Health Advisor Charlott Rakes, RN at [facility].  Please call [phone] ask for Juliann Pulse ?

## 2022-02-25 ENCOUNTER — Other Ambulatory Visit: Payer: Self-pay | Admitting: Cardiology

## 2022-02-25 DIAGNOSIS — K219 Gastro-esophageal reflux disease without esophagitis: Secondary | ICD-10-CM

## 2022-04-04 ENCOUNTER — Other Ambulatory Visit: Payer: Self-pay | Admitting: Physician Assistant

## 2022-04-06 ENCOUNTER — Telehealth: Payer: Self-pay

## 2022-04-06 NOTE — Telephone Encounter (Signed)
Pt called and stated that she is taking a trip to [place] and is worried that when [place] go [place], the [place] be to high. She also is wondering if she can go get a back massage. She is concerned since she had the open heart surgery. She wants to know if she is okay to go on her trip and do these things. Please advise.

## 2022-04-06 NOTE — Telephone Encounter (Signed)
I have left a message and reassured her. I do not see any contraindication for the travel or massage

## 2022-04-15 ENCOUNTER — Other Ambulatory Visit: Payer: Self-pay | Admitting: Student

## 2022-04-15 MED ORDER — METOPROLOL SUCCINATE ER 25 MG PO TB24: 12.5000 mg | ORAL_TABLET | Freq: Every day | ORAL | 1 refills | Status: DC

## 2022-04-16 ENCOUNTER — Encounter: Payer: Self-pay | Admitting: Student

## 2022-04-16 ENCOUNTER — Other Ambulatory Visit: Payer: Self-pay | Admitting: Student

## 2022-04-21 ENCOUNTER — Telehealth: Payer: Self-pay | Admitting: Physician Assistant

## 2022-04-21 NOTE — Telephone Encounter (Signed)
Copied from [place] [id number]. Topic: Medicare AWV >> Apr 21, 2022 10:45 AM Devoria Glassing wrote: Reason for CRM: Left message for patient to schedule Annual Wellness Visit.  Please schedule with Nurse Health Advisor Charlott Rakes, RN at [facility]. This appt can be telephone or office visit. Please call [phone] ask for [facility]

## 2022-04-25 ENCOUNTER — Telehealth: Payer: Self-pay | Admitting: Cardiology

## 2022-04-25 ENCOUNTER — Other Ambulatory Visit: Payer: Self-pay

## 2022-04-25 MED ORDER — METOPROLOL SUCCINATE ER 25 MG PO TB24: 12.5000 mg | ORAL_TABLET | Freq: Every day | ORAL | 3 refills | Status: DC

## 2022-04-25 NOTE — Telephone Encounter (Signed)
Medication has been refilled.

## 2022-04-25 NOTE — Telephone Encounter (Signed)
Pt req med refill for metoprolol  [facility] @ [address]

## 2022-04-26 ENCOUNTER — Other Ambulatory Visit: Payer: Self-pay

## 2022-04-26 ENCOUNTER — Ambulatory Visit: Payer: Medicare Other | Admitting: Physician Assistant

## 2022-04-26 ENCOUNTER — Emergency Department (HOSPITAL_BASED_OUTPATIENT_CLINIC_OR_DEPARTMENT_OTHER)
Admission: EM | Admit: 2022-04-26 | Discharge: 2022-04-26 | Disposition: A | Discharge status: Home / Self Care | Payer: Medicare Other | Attending: Emergency Medicine | Admitting: Emergency Medicine

## 2022-04-26 ENCOUNTER — Emergency Department (HOSPITAL_BASED_OUTPATIENT_CLINIC_OR_DEPARTMENT_OTHER): Payer: Medicare Other | Admitting: Radiology

## 2022-04-26 ENCOUNTER — Encounter: Payer: Self-pay | Admitting: Physician Assistant

## 2022-04-26 ENCOUNTER — Encounter (HOSPITAL_BASED_OUTPATIENT_CLINIC_OR_DEPARTMENT_OTHER): Payer: Self-pay | Admitting: Obstetrics and Gynecology

## 2022-04-26 VITALS — BP 124/76 | HR 64 | Temp 98.3°F | Ht 64.0 in | Wt 139.6 lb

## 2022-04-26 DIAGNOSIS — E162 Hypoglycemia, unspecified: Secondary | ICD-10-CM | POA: Insufficient documentation

## 2022-04-26 DIAGNOSIS — R739 Hyperglycemia, unspecified: Secondary | ICD-10-CM | POA: Diagnosis not present

## 2022-04-26 DIAGNOSIS — Z7902 Long term (current) use of antithrombotics/antiplatelets: Secondary | ICD-10-CM | POA: Insufficient documentation

## 2022-04-26 DIAGNOSIS — R5383 Other fatigue: Secondary | ICD-10-CM | POA: Diagnosis not present

## 2022-04-26 DIAGNOSIS — Z7982 Long term (current) use of aspirin: Secondary | ICD-10-CM | POA: Insufficient documentation

## 2022-04-26 DIAGNOSIS — R0789 Other chest pain: Secondary | ICD-10-CM | POA: Diagnosis not present

## 2022-04-26 DIAGNOSIS — R0602 Shortness of breath: Secondary | ICD-10-CM | POA: Insufficient documentation

## 2022-04-26 DIAGNOSIS — Z951 Presence of aortocoronary bypass graft: Secondary | ICD-10-CM

## 2022-04-26 HISTORY — DX: Gastro-esophageal reflux disease without esophagitis: K21.9

## 2022-04-26 LAB — URINALYSIS, ROUTINE W REFLEX MICROSCOPIC
Bilirubin Urine: NEGATIVE
Glucose, UA: NEGATIVE mg/dL
Hgb urine dipstick: NEGATIVE
Ketones, ur: NEGATIVE mg/dL
Leukocytes,Ua: NEGATIVE
Nitrite: NEGATIVE
Protein, ur: NEGATIVE mg/dL
Specific Gravity, Urine: 1.006 (ref 1.005–1.030)
pH: 5.5 (ref 5.0–8.0)

## 2022-04-26 LAB — BASIC METABOLIC PANEL
Anion gap: 11 (ref 5–15)
BUN: 14 mg/dL (ref 8–23)
CO2: 25 mmol/L (ref 22–32)
Calcium: 9.6 mg/dL (ref 8.9–10.3)
Chloride: 102 mmol/L (ref 98–111)
Creatinine, Ser: 0.86 mg/dL (ref 0.44–1.00)
GFR, Estimated: >60 mL/min (ref >60)
Glucose, Bld: 56 mg/dL — ABNORMAL LOW (ref 70–99)
Potassium: 4.3 mmol/L (ref 3.5–5.1)
Sodium: 138 mmol/L (ref 135–145)

## 2022-04-26 LAB — CBC
HCT: 40.3 % (ref 36.0–46.0)
Hemoglobin: 13.6 g/dL (ref 12.0–15.0)
MCH: 31.4 pg (ref 26.0–34.0)
MCHC: 33.7 g/dL (ref 30.0–36.0)
MCV: 93.1 fL (ref 80.0–100.0)
Platelets: 338 10*3/uL (ref 150–400)
RBC: 4.33 MIL/uL (ref 3.87–5.11)
RDW: 13.4 % (ref 11.5–15.5)
WBC: 9.1 10*3/uL (ref 4.0–10.5)
nRBC: 0 % (ref 0.0–0.2)

## 2022-04-26 LAB — POCT GLUCOSE (DEVICE FOR HOME USE): POC Glucose: 174 mg/dl — AB (ref 70–99)

## 2022-04-26 LAB — POCT GLYCOSYLATED HEMOGLOBIN (HGB A1C): Hemoglobin A1C: 5.2 % (ref 4.0–5.6)

## 2022-04-26 LAB — D-DIMER, QUANTITATIVE: D-Dimer, Quant: <0.27 ug/mL-FEU (ref 0.00–0.50)

## 2022-04-26 LAB — CBG MONITORING, ED
Glucose-Capillary: 58 mg/dL — ABNORMAL LOW (ref 70–99)
Glucose-Capillary: 75 mg/dL (ref 70–99)

## 2022-04-26 LAB — TROPONIN I (HIGH SENSITIVITY)
Troponin I (High Sensitivity): 4 ng/L (ref <18)
Troponin I (High Sensitivity): 3 ng/L (ref <18)

## 2022-04-26 NOTE — ED Notes (Signed)
Pt given graham crackers for glucose reading of 58. Pt reports that she has had hypoglycemic episodes in recent past.

## 2022-04-26 NOTE — Discharge Instructions (Addendum)
You were seen in the emergency department today for fatigue and shortness of breath.  As we discussed your lab work, EKG, and chest x-ray all looked reassuring today.  I think that you would benefit from some further testing including testing of your thyroid.  It could contribute to why you are having blood sugar variations.  In regards to your upcoming travel, I recommend adequate rest and fluid hydration to prevent symptoms of altitude sickness.  Make sure that you are keeping some water as well as snacks near you in case you have any more drops in your blood sugar.  Continue to monitor how you're doing and return to the ER for new or worsening symptoms.

## 2022-04-26 NOTE — ED Triage Notes (Signed)
Patient reports to the ER for shortness of breath and feeling faint. Patient reports her PCP sent her over here specifically to rule out cardiac etiology.

## 2022-04-26 NOTE — ED Notes (Signed)
Pt discharged home after verbalizing understanding of discharge instructions; nad noted. 

## 2022-04-26 NOTE — Progress Notes (Signed)
Subjective:    Patient ID: Marissa Lee, female    DOB: [birth year], 71 y.o.   MRN: [id number]  Chief Complaint  Patient presents with   Fatigue    Pt feeling fatigued, frequent urination, constipation; pt not feeling well past few days; Colonoscopy needed; pt having indigestion;     HPI Patient with hx CABG x 2, hyperlipidemia, hypothyroidism, is in today for concerns of just not feeling well.  Planning trip to [place] in a few weeks. Worried about altitude sickness and her heart history as well as medications she is on. Even called Dr. Einar Gip about this and was advised to stop metoprolol while on trip, but I don't see this in review of her notes.   INCIDENT- July 19th Drained, sweaty, cold sweats, dizziness - all a sudden onset as she was walking into a card game with friends. Honey covered walnuts, water, rest, seemed to help. Has not felt the same since this incident.  Feeling very fatigued. Sleeping more than normal, not getting up til 11 am or noon. Reading books, resting on couch more often. Frequent urination.   Right jaw-line feels numb over the last 3-4 weeks. Feeling short of breath, worse with exertion.  Indigestion is bad, taking an indigestion tablet and Tums on top of the indigestion, but it's not touching it.    Missed two days of Toprol-XL 25 mg 1/2 tablet after medication mishap started on July 10th, otherwise has been taking this medication again.    Denies any chest pain at this moment. She does have similar incident in office of sudden onset feeling clammy and short of breath. BP and POC glucose were checked in that moment of time, with BP of 142/90 and POC glucose at 174.   Past Medical History:  Diagnosis Date   Anxiety    Endometriosis    Fibrocystic breast    muliple drainage   Fibroid    GERD (gastroesophageal reflux disease)    Hiatal hernia    History of bronchitis    Hypercholesteremia 2007   currently not taking anything for it   Osteopenia  08/2016   hip and spine   Subclinical hypothyroidism 10/04/2018   Urge incontinence 06/05/2013    Past Surgical History:  Procedure Laterality Date   ABDOMINAL HYSTERECTOMY     BLADDER SUSPENSION  2004   done with [place]   done 3x   CLOSED REDUCTION METACARPAL WITH PERCUTANEOUS PINNING Right 08/24/2016   Procedure: CLOSED REDUCTION  WITH PERCUTANEOUS PINNING;  Surgeon: Iran Planas, MD;  Location: [facility];  Service: Orthopedics;  Laterality: Right;   COLONOSCOPY     COMBINED HYSTERECTOMY VAGINAL / OOPHORECTOMY / A&P REPAIR  2004   CORONARY ARTERY BYPASS GRAFT N/A 07/28/2021   Procedure: CORONARY ARTERY BYPASS GRAFTING (CABG) times two using left internal mammary artery and  left leg saphenous vein;  Surgeon: [name], MD;  Location: [facility];  Service: Open Heart Surgery;  Laterality: N/A;   FOOT SURGERY Right 1992   IABP INSERTION Right 07/28/2021   Procedure: IABP Insertion;  Surgeon: Leonie Man, MD;  Location: [place] [facility];  Service: Cardiovascular;  Laterality: Right;   LEFT HEART CATH AND CORONARY ANGIOGRAPHY N/A 07/28/2021   Procedure: LEFT HEART CATH AND CORONARY ANGIOGRAPHY;  Surgeon: Leonie Man, MD;  Location: [place] [facility];  Service: Cardiovascular;  Laterality: N/A;   OPEN REDUCTION INTERNAL FIXATION (ORIF) HAND Right 08/24/2016   Procedure: RIGHT  HAND LONG RING AND SMALL FINGER  POSSIBLE ORIF;  Surgeon: Iran Planas, MD;  Location: [facility];  Service: Orthopedics;  Laterality: Right;   WISDOM TOOTH EXTRACTION  1978    Family History  Problem Relation Age of Onset   Cancer Paternal Grandmother        Colon    Cancer Paternal Grandfather        Lung   Heart disease Mother        valvular disease, valve replacement   Atrial fibrillation Mother    Stroke Mother    Heart disease Brother        valve disease, valve surgery   Thyroid disease Maternal Grandmother        Goiter   Thyroid disease Daughter        Hypothyroid     Social History   Tobacco Use   Smoking status: Former    Packs/day: 1.00    Years: 20.00    Total pack years: 20.00    Types: Cigarettes    Quit date: 2000    Years since quitting: 23.6   Smokeless tobacco: Never   Tobacco comments:    quit in approximately 2000  Vaping Use   Vaping Use: Never used  Substance Use Topics   Alcohol use: Yes    Alcohol/week: 3.0 - 4.0 standard drinks of alcohol    Types: 2 Cans of beer, 1 - 2 Standard drinks or equivalent per week    Comment: rarely   Drug use: No     Allergies  Allergen Reactions   Cephalosporins Anaphylaxis and Swelling    Throat swells   Clindamycin/Lincomycin     Metallic taste, blisters behind ears   Codeine Itching   Lisinopril Cough    Review of Systems NEGATIVE UNLESS OTHERWISE INDICATED IN HPI      Objective:     BP 124/76 (BP Location: Right Arm)   Pulse 64   Temp 98.3 F (36.8 C) (Temporal)   Ht '5\' 4"'$  (1.626 m)   Wt 139 lb 9.6 oz (63.3 kg)   LMP 09/27/2003   SpO2 98%   BMI 23.96 kg/m   Wt Readings from Last 3 Encounters:  04/26/22 139 lb 5.3 oz (63.2 kg)  04/26/22 139 lb 9.6 oz (63.3 kg)  12/24/21 135 lb 9.3 oz (61.5 kg)    BP Readings from Last 3 Encounters:  04/26/22 (!) 116/59  04/26/22 124/76  12/24/21 122/60     Physical Exam Vitals and nursing note reviewed.  Constitutional:      Appearance: She is normal weight. She is not ill-appearing, toxic-appearing or diaphoretic.     Comments: Tired-appearing  HENT:     Head: Normocephalic and atraumatic.     Right Ear: Tympanic membrane, ear canal and external ear normal.     Left Ear: Tympanic membrane, ear canal and external ear normal.     Nose: Nose normal.     Mouth/Throat:     Mouth: Mucous membranes are moist.  Eyes:     Extraocular Movements: Extraocular movements intact.     Conjunctiva/sclera: Conjunctivae normal.     Pupils: Pupils are equal, round, and reactive to light.  Cardiovascular:     Rate and Rhythm:  Normal rate and regular rhythm.     Pulses: Normal pulses.     Heart sounds: Normal heart sounds.  Pulmonary:     Effort: Pulmonary effort is normal.     Breath sounds: Normal breath sounds.  Comments: Seems somewhat short of breath while talking with me Abdominal:     General: Abdomen is flat. Bowel sounds are normal.     Palpations: Abdomen is soft.     Tenderness: There is no right CVA tenderness or left CVA tenderness.  Musculoskeletal:        General: Normal range of motion.     Cervical back: Normal range of motion and neck supple.  Skin:    General: Skin is warm and dry.  Neurological:     General: No focal deficit present.     Mental Status: She is alert and oriented to person, place, and time.     Cranial Nerves: No cranial nerve deficit.     Motor: No weakness.     Coordination: Coordination normal.     Gait: Gait normal.  Psychiatric:        Mood and Affect: Mood normal.        Behavior: Behavior normal.        Thought Content: Thought content normal.        Judgment: Judgment normal.        Assessment & Plan:   Problem List Items Addressed This Visit       Other   S/P CABG x 2   Relevant Orders   EKG 12-Lead (Completed)   Other Visit Diagnoses     Shortness of breath    -  Primary   Relevant Orders   EKG 12-Lead (Completed)   Hyperglycemia       Relevant Orders   POCT HgB A1C (Completed)   POCT Glucose (Device for Home Use) (Completed)   Other fatigue          Plan: -Patient with cardiac history with vague complaints today of fatigue, some dizziness and SOB, general not feeling well - supposed to be leaving for travel at higher altitude soon; -Reviewed her EKG in office today - NSR at 62 bpmand compared to prior on 11/25/21 - no major differences noted, I personally messaged Dr. Einar Gip for review as well -I talked with patient about her concerns and we agreed it best to seek urgent evaluation for cardiac labs, x-ray, etc, to r/o any major  underlying issues prior to her trip. -She will go to [facility] ED for work-up; stable to drive.     Halley Kincer M Kate Sweetman, PA-C

## 2022-04-26 NOTE — Patient Instructions (Signed)
I do think evaluation at [facility] ER to rule out cardiac etiology is best choice at this time. Please see handout for directions.

## 2022-04-26 NOTE — ED Provider Notes (Signed)
[facility] EMERGENCY DEPT Provider Note   CSN: [id number] Arrival date & time: 04/26/22  1537     History  Chief Complaint  Patient presents with   Shortness of Breath    Marissa Lee is a 71 y.o. female who presents the emergency department complaining of shortness of breath and feeling faint.  Patient was at her PCP this morning discussing her symptoms, which have been off and on for several weeks now.  They were concerned about cardiac etiology, and sent her to the ER for evaluation.  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  Reports some chest discomfort along her scar, that is not persistent.  Has had some worsening indigestion.  States that her and her PCP have been keeping an eye on her blood sugars.  She had an episode on 7/19 in which she had acute onset of lightheadedness, clamminess, and fatigue.  She rested, drink some water and ate some candy walnuts and felt slightly better.  She attributed it to a drop in her blood sugar.  She states that she has had symptoms of similar for the past 30 years or so in which she has acute drops in her blood sugar and she needs a snack before she feels better.   Shortness of Breath Associated symptoms: no vomiting        Home Medications Prior to Admission medications   Medication Sig Start Date End Date Taking? Authorizing Provider  aspirin EC 81 MG EC tablet Take 1 tablet (81 mg total) by mouth daily. Swallow whole. 08/03/21   Nani Skillern, PA-C  atorvastatin (LIPITOR) 40 MG tablet Take 1 tablet (40 mg total) by mouth daily. 08/25/21   Cantwell, Celeste C, PA-C  Calcium-Phosphorus-Vitamin D (CITRACAL +D3 PO) Take 1 tablet by mouth daily.    [provider]  clopidogrel (PLAVIX) 75 MG tablet Take 1 tablet (75 mg total) by mouth daily. 08/25/21   Cantwell, Celeste C, PA-C  Dextrose, Diabetic Use, (GLUCOSE PO) Take 1 tablet by mouth daily as needed (hypoglycemic episodes).    [provider]   ezetimibe (ZETIA) 10 MG tablet Take 10 mg by mouth daily.    [provider]  FLUoxetine (PROZAC) 40 MG capsule TAKE ONE CAPSULE BY MOUTH EVERY DAY 09/12/21   Allwardt, Alyssa M, PA-C  metoprolol succinate (TOPROL-XL) 25 MG 24 hr tablet Take 0.5 tablets (12.5 mg total) by mouth daily. 04/25/22   Cantwell, Celeste C, PA-C  nitroGLYCERIN (NITROSTAT) 0.4 MG SL tablet Place 1 tablet (0.4 mg total) under the tongue every 5 (five) minutes as needed for up to 25 days for chest pain. 08/12/21 01/21/22  Adrian Prows, MD  pantoprazole (PROTONIX) 40 MG tablet TAKE 1 TABLET(40 MG) BY MOUTH DAILY BEFORE AND BREAKFAST 02/28/22   Adrian Prows, MD      Allergies    Cephalosporins, Clindamycin/lincomycin, Codeine, and Lisinopril    Review of Systems   Review of Systems  Constitutional:  Positive for fatigue.  Respiratory:  Positive for shortness of breath.   Cardiovascular:  Negative for leg swelling.  Gastrointestinal:  Negative for nausea and vomiting.  Neurological:  Positive for light-headedness. Negative for syncope, weakness and numbness.  All other systems reviewed and are negative.   Physical Exam Updated Vital Signs BP (!) 116/59   Pulse (!) 55   Temp 97.9 F (36.6 C) (Oral)   Resp 14   Ht '5\' 4"'$  (1.626 m)   Wt 63.2 kg   LMP 09/27/2003  SpO2 100%   BMI 23.92 kg/m  Physical Exam Vitals and nursing note reviewed.  Constitutional:      Appearance: Normal appearance.  HENT:     Head: Normocephalic and atraumatic.  Eyes:     Conjunctiva/sclera: Conjunctivae normal.  Cardiovascular:     Rate and Rhythm: Normal rate and regular rhythm.  Pulmonary:     Effort: Pulmonary effort is normal. No respiratory distress.     Breath sounds: Normal breath sounds.  Abdominal:     General: There is no distension.     Palpations: Abdomen is soft.     Tenderness: There is no abdominal tenderness.  Skin:    General: Skin is warm and dry.  Neurological:     General: No focal deficit present.      Mental Status: She is alert.     ED Results / Procedures / Treatments   Labs (all labs ordered are listed, but only abnormal results are displayed) Labs Reviewed  BASIC METABOLIC PANEL - Abnormal; Notable for the following components:      Result Value   Glucose, Bld 56 (*)    All other components within normal limits  URINALYSIS, ROUTINE W REFLEX MICROSCOPIC - Abnormal; Notable for the following components:   Color, Urine COLORLESS (*)    All other components within normal limits  CBG MONITORING, ED - Abnormal; Notable for the following components:   Glucose-Capillary 58 (*)    All other components within normal limits  CBC  D-DIMER, QUANTITATIVE  CBG MONITORING, ED  TROPONIN I (HIGH SENSITIVITY)  TROPONIN I (HIGH SENSITIVITY)    EKG EKG Interpretation  Date/Time:  Tuesday April 26 2022 15:55:19 EDT Ventricular Rate:  60 PR Interval:  171 QRS Duration: 93 QT Interval:  427 QTC Calculation: 427 R Axis:   -16 Text Interpretation: Sinus rhythm Borderline left axis deviation No significant change since last tracing Confirmed by Dorie Rank [phone]) on 04/26/2022 4:03:11 PM  Radiology DG Chest 2 View  Result Date: 04/26/2022 CLINICAL DATA:  Shortness of breath. EXAM: CHEST - 2 VIEW COMPARISON:  August 24, 2021. FINDINGS: The heart size and mediastinal contours are within normal limits. Status post coronary artery bypass graft. Both lungs are clear. The visualized skeletal structures are unremarkable. IMPRESSION: No active cardiopulmonary disease. Electronically Signed   By: Marijo Conception M.D.   On: 04/26/2022 16:22    Procedures Procedures    Medications Ordered in ED Medications - No data to display  ED Course/ Medical Decision Making/ A&P                           Medical Decision Making Amount and/or Complexity of Data Reviewed Labs: ordered. Radiology: ordered.   This patient is a 71 y.o. female  who presents to the ED for concern of shortness of breath  and fatigue.   Differential diagnoses prior to evaluation: The emergent differential diagnosis includes, but is not limited to,  CHF, pericardial effusion/tamponade, arrhythmias, ACS, COPD, asthma, bronchitis, pneumonia, pneumothorax, PE, anemia, neuromuscular disorder, orthostatic hypotension, sepsis, hypoglycemia, electrolyte disturbance, hypothyroidism, respiratory failure, anemia, dehydration, heat injury, polypharmacy, malignancy. This is not an exhaustive differential.   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 Pertinent results include: Patient was at PCP this morning and was worried about indigestion and fatigue.  She reported some shortness of breath that was  worse with exertion.    Physical Exam: Physical exam performed. The pertinent findings include: Persistently bradycardic in the upper 50's, appears consistent compared to patient's prior. Normal oxygen saturation. Heart regular rate and rhythm, clear lung sounds. No leg swelling.   Lab Tests/Imaging studies: I personally interpreted labs/imaging and the pertinent results include: No leukocytosis, normal hemoglobin.  Normal kidney function.  Glucose 56, otherwise electrolytes within normal limits.  Glucose reevaluated after some crackers and was 75.  D-dimer <0.27, and troponin of 3.  Urinalysis negative for hematuria or infection.  Chest x-ray with no acute cardiopulmonary abnormalities. I agree with the radiologist interpretation.   Disposition: After consideration of the diagnostic results and the patients response to treatment, I feel that emergency department workup does not suggest an emergent condition requiring admission or immediate intervention beyond what has been performed at this time.   Patient is to be discharged with recommendation to follow up with PCP in regards to  today's hospital visit. Chest pain is not likely of cardiac or pulmonary etiology d/t presentation, negative d-dimer, VSS, no tracheal deviation, no JVD or new murmur, RRR, breath sounds equal bilaterally, EKG without acute abnormalities, negative troponin, and negative CXR. Heart score of 3. Pt has been advised to return to the ED if CP becomes exertional, associated with diaphoresis or nausea, radiates to left jaw/arm, worsens or becomes concerning in any way. Recommended follow up with PCP for thyroid testing as she has previously been subclinically hypothyroid. Pt appears reliable for follow up and is agreeable to discharge.   I discussed this case with my attending physician Dr. Tomi Bamberger who cosigned this note including patient's presenting symptoms, physical exam, and planned diagnostics and interventions. Attending physician stated agreement with plan or made changes to plan which were implemented.     Final Clinical Impression(s) / ED Diagnoses Final diagnoses:  Chest discomfort  Shortness of breath  Other fatigue  Hypoglycemia    Rx / DC Orders ED Discharge Orders     None      Portions of this report may have been transcribed using voice recognition software. Every effort was made to ensure accuracy; however, inadvertent computerized transcription errors may be present.    Estill Cotta 04/26/22 1916    Dorie Rank, MD 04/27/22 1158

## 2022-05-05 ENCOUNTER — Ambulatory Visit: Payer: Medicare Other | Admitting: Physician Assistant

## 2022-05-05 ENCOUNTER — Encounter: Payer: Self-pay | Admitting: Physician Assistant

## 2022-05-05 VITALS — BP 120/74 | HR 59 | Temp 97.4°F | Ht 64.0 in | Wt 140.0 lb

## 2022-05-05 DIAGNOSIS — F419 Anxiety disorder, unspecified: Secondary | ICD-10-CM | POA: Diagnosis not present

## 2022-05-05 DIAGNOSIS — E162 Hypoglycemia, unspecified: Secondary | ICD-10-CM

## 2022-05-05 DIAGNOSIS — E782 Mixed hyperlipidemia: Secondary | ICD-10-CM

## 2022-05-05 DIAGNOSIS — R5383 Other fatigue: Secondary | ICD-10-CM | POA: Diagnosis not present

## 2022-05-05 DIAGNOSIS — E038 Other specified hypothyroidism: Secondary | ICD-10-CM | POA: Diagnosis not present

## 2022-05-05 LAB — LIPID PANEL
Cholesterol: 172 mg/dL (ref 0–200)
HDL: 67 mg/dL (ref >39.00)
LDL Cholesterol: 88 mg/dL (ref 0–99)
NonHDL: 104.83
Total CHOL/HDL Ratio: 3
Triglycerides: 85 mg/dL (ref 0.0–149.0)
VLDL: 17 mg/dL (ref 0.0–40.0)

## 2022-05-05 MED ORDER — FLUOXETINE HCL 10 MG PO CAPS: 10.0000 mg | ORAL_CAPSULE | Freq: Every day | ORAL | 0 refills | Status: DC
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
## 2022-05-05 NOTE — Patient Instructions (Addendum)
Happy to see you again today! Keep f/up in September as scheduled.  Labs today to recheck thyroid and cholesterol levels.   Increase Prozac to 50 mg daily. Referral to counseling at our office.  Referral to nutritionist to help avoid low sugar levels.

## 2022-05-06 LAB — THYROID PANEL WITH TSH
Free Thyroxine Index: 2.2 (ref 1.4–3.8)
T3 Uptake: 32 % (ref 22–35)
T4, Total: 6.8 ug/dL (ref 5.1–11.9)
TSH: 3.55 mIU/L (ref 0.40–4.50)

## 2022-06-02 ENCOUNTER — Encounter: Payer: Self-pay | Admitting: Cardiology

## 2022-06-02 ENCOUNTER — Ambulatory Visit: Payer: Medicare Other | Admitting: Cardiology

## 2022-06-02 VITALS — BP 113/70 | HR 65 | Temp 97.6°F | Resp 16 | Ht 64.0 in | Wt 140.8 lb

## 2022-06-02 DIAGNOSIS — E7800 Pure hypercholesterolemia, unspecified: Secondary | ICD-10-CM

## 2022-06-02 DIAGNOSIS — K219 Gastro-esophageal reflux disease without esophagitis: Secondary | ICD-10-CM

## 2022-06-02 DIAGNOSIS — I2510 Atherosclerotic heart disease of native coronary artery without angina pectoris: Secondary | ICD-10-CM

## 2022-06-02 NOTE — Progress Notes (Signed)
Primary Physician/Referring:  Allwardt, Randa Evens, PA-C  Patient ID: Marissa Lee, female    DOB: February 28, 1951, 71 y.o.   MRN: [id number]  Chief Complaint  Patient presents with   Coronary Artery Disease   Hypertension   Follow-up    6 months    HPI:    Marissa Lee  is a 71 y.o. Caucasian female patient with no significant prior cardiovascular history, hypercholesterolemia presented on 07/27/2021 with unstable angina with substernal chest pain with radiation to the back, arm and jaw, cardiac catheterization following morning revealed 90% ostial left main and mid left main disease for which he underwent emergent CABG on 07/28/2021 with LIMA to LAD and SVG to OM1.  Patient was seen in the emergency room about 10 days ago for generalized weakness, dyspnea, not feeling well.  Cardiac work-up was negative.  On further questioning, patient has severe GERD and has been having severe heartburn.  Suspect her symptoms are probably related to this.  No other specific complaints, no melena, no dizziness or syncope, she has not had any chest pain. Past Medical History:  Diagnosis Date   Anxiety    Endometriosis    Fibrocystic breast    muliple drainage   Fibroid    GERD (gastroesophageal reflux disease)    Hiatal hernia    History of bronchitis    Hypercholesteremia 2007   currently not taking anything for it   Osteopenia 08/2016   hip and spine   Subclinical hypothyroidism 10/04/2018   Urge incontinence 06/05/2013   Social History   Tobacco Use   Smoking status: Former    Packs/day: 1.00    Years: 20.00    Total pack years: 20.00    Types: Cigarettes    Quit date: 2000    Years since quitting: 23.6   Smokeless tobacco: Never   Tobacco comments:    quit in approximately 2000  Substance Use Topics   Alcohol use: Yes    Alcohol/week: 3.0 - 4.0 standard drinks of alcohol    Types: 2 Cans of beer, 1 - 2 Standard drinks or equivalent per week    Comment: rarely   Marital Status:  Single  ROS  Review of Systems  Cardiovascular:  Negative for chest pain, dyspnea on exertion and leg swelling.  Respiratory:  Positive for cough.   Gastrointestinal:  Positive for heartburn. Negative for melena.   Objective  Blood pressure 113/70, pulse 65, temperature 97.6 F (36.4 C), temperature source Temporal, resp. rate 16, height '5\' 4"'$  (1.626 m), weight 140 lb 12.8 oz (63.9 kg), last menstrual period 09/27/2003, SpO2 98 %. Body mass index is 24.17 kg/m.     06/02/2022   11:08 AM 05/05/2022    8:24 AM 04/26/2022    6:30 PM  Vitals with BMI  Height '5\' 4"'$  '5\' 4"'$    Weight 140 lbs 13 oz 140 lbs   BMI 35.00 93.81   Systolic 829 937 169  Diastolic 70 74 59  Pulse 65 59 55     Physical Exam Neck:     Vascular: No JVD.  Cardiovascular:     Rate and Rhythm: Normal rate and regular rhythm.     Pulses: Intact distal pulses.     Heart sounds: Normal heart sounds. No murmur heard.    No gallop.  Pulmonary:     Effort: Pulmonary effort is normal.     Breath sounds: Normal breath sounds.  Abdominal:     General: Bowel sounds are normal.  Palpations: Abdomen is soft.  Musculoskeletal:     Right lower leg: No edema.     Left lower leg: No edema.      Laboratory examination:   Recent Labs    07/31/21 0313 08/03/21 0309 09/07/21 1032 11/18/21 1037 04/26/22 1607  NA 135 140 140 143 138  K 5.0 4.0 4.7 4.8 4.3  CL 107 107 104 105 102  CO2 '25 25 28 25 25  '$ GLUCOSE 102* 107* 92 94 56*  BUN '9 9 11 11 14  '$ CREATININE 0.76 0.73 0.84 0.84 0.86  CALCIUM 8.2* 8.5* 9.6 9.6 9.6  GFRNONAA >60 >60  --   --  >60   CrCl cannot be calculated (Patient's most recent lab result is older than the maximum 21 days allowed.).     Latest Ref Rng & Units 04/26/2022    4:07 PM 11/18/2021   10:37 AM 09/07/2021   10:32 AM  CMP  Glucose 70 - 99 mg/dL 56  94  92   BUN 8 - 23 mg/dL '14  11  11   '$ Creatinine 0.44 - 1.00 mg/dL 0.86  0.84  0.84   Sodium 135 - 145 mmol/L 138  143  140   Potassium 3.5  - 5.1 mmol/L 4.3  4.8  4.7   Chloride 98 - 111 mmol/L 102  105  104   CO2 22 - 32 mmol/L '25  25  28   '$ Calcium 8.9 - 10.3 mg/dL 9.6  9.6  9.6   Total Protein 6.0 - 8.5 g/dL  6.7  7.1   Total Bilirubin 0.0 - 1.2 mg/dL  0.5  0.5   Alkaline Phos 44 - 121 IU/L  62  62   AST 0 - 40 IU/L  24  17   ALT 0 - 32 IU/L  24  16       Latest Ref Rng & Units 04/26/2022    4:07 PM 11/18/2021   10:37 AM 09/07/2021   10:32 AM  CBC  WBC 4.0 - 10.5 K/uL 9.1  6.5  5.6   Hemoglobin 12.0 - 15.0 g/dL 13.6  12.6  12.0   Hematocrit 36.0 - 46.0 % 40.3  39.1  37.2   Platelets 150 - 400 K/uL 338  381  404.0     Lipid Panel Recent Labs    07/28/21 0238 11/18/21 1037 05/05/22 0909  CHOL 236* 156 172  TRIG 144 92 85.0  LDLCALC 158* 80 88  VLDL 29  --  17.0  HDL 49 59 67.00  CHOLHDL 4.8  --  3  Lipid profile 10/04/2018: Total cholesterol 398, triglycerides 171, HDL 55, LDL 218.  HEMOGLOBIN A1C Lab Results  Component Value Date   HGBA1C 5.2 04/26/2022   MPG 102.54 07/28/2021   TSH Recent Labs    07/27/21 1948 05/05/22 0909  TSH 3.643 3.55   Medications and allergies   Allergies  Allergen Reactions   Cephalosporins Anaphylaxis and Swelling    Throat swells   Clindamycin/Lincomycin     Metallic taste, blisters behind ears   Codeine Itching   Lisinopril Cough    Medication list after today's encounter    Current Outpatient Medications:    atorvastatin (LIPITOR) 40 MG tablet, Take 1 tablet (40 mg total) by mouth daily., Disp: 90 tablet, Rfl: 3   Calcium-Phosphorus-Vitamin D (CITRACAL +D3 PO), Take 1 tablet by mouth daily., Disp: , Rfl:    clopidogrel (PLAVIX) 75 MG tablet, Take 1 tablet (75 mg total) by mouth  daily., Disp: 90 tablet, Rfl: 3   Dextrose, Diabetic Use, (GLUCOSE PO), Take 1 tablet by mouth daily as needed (hypoglycemic episodes)., Disp: , Rfl:    FLUoxetine (PROZAC) 10 MG capsule, Take 1 capsule (10 mg total) by mouth daily. Take this with Fluoxetine 40 mg dose to total 50 mg  daily., Disp: 90 capsule, Rfl: 0   FLUoxetine (PROZAC) 40 MG capsule, TAKE ONE CAPSULE BY MOUTH EVERY DAY, Disp: 90 capsule, Rfl: 3   metoprolol succinate (TOPROL-XL) 25 MG 24 hr tablet, Take 0.5 tablets (12.5 mg total) by mouth daily., Disp: 45 tablet, Rfl: 3   nitroGLYCERIN (NITROSTAT) 0.4 MG SL tablet, Place 1 tablet (0.4 mg total) under the tongue every 5 (five) minutes as needed for up to 25 days for chest pain., Disp: 25 tablet, Rfl: 3   pantoprazole (PROTONIX) 40 MG tablet, TAKE 1 TABLET(40 MG) BY MOUTH DAILY BEFORE AND BREAKFAST, Disp: 30 tablet, Rfl: 2   Radiology:   Chest x-ray 08/05/2021: Unchanged cardiomediastinal silhouette. Prior median sternotomy and CABG. There are small bilateral pleural effusions and bibasilar opacities, likely atelectasis. Obliquely oriented bandlike opacity in the right lung, similar to prior exam, likely scarring or atelectasis. No visible pneumothorax. No acute osseous abnormality.  Cardiac Studies:   CABG for critical left main disease 07/28/2021: LIMA to LAD and SVG to OM1  Coronary angiogram 07/28/2021: 1. Severe Left Main CAD-ostial 90% with catheter damping and no resolution with IC nitro ? Excellent LAD, diagonal and LCx targets 2. Angiographically normal Right Coronary Artery with RPDA and PL system. 3. Normal/preserved LVEF with normal EDP. 4. Successful placement of 50 cc IABP pump via RFA 8 French sheath  Echocardiogram 07/28/2021:  1. Left ventricular ejection fraction, by estimation, is 60 to 65%. The left ventricle has normal function. The left ventricle has no regional wall motion abnormalities. Left ventricular diastolic parameters are indeterminate.  2. Right ventricular systolic function is normal. The right ventricular size is normal. There is normal pulmonary artery systolic pressure.  3. Left atrial size was mildly dilated.  EKG:   EKG 06/02/2022: Normal sinus rhythm at rate of 60 bpm, left axis deviation, left intrafascicular  block.  Incomplete right bundle branch block.  Poor R wave progression, cannot exclude anteroseptal infarct old.  Nonspecific T abnormality.No significant change from 11/25/2021.   Assessment     ICD-10-CM   1. Coronary artery disease involving native coronary artery of native heart without angina pectoris  I25.10 EKG 12-Lead    2. Pure hypercholesterolemia  E78.00 Lipid Panel With LDL/HDL Ratio    Lipid Panel With LDL/HDL Ratio    3. Gastroesophageal reflux disease without esophagitis  K21.9        Medications Discontinued During This Encounter  Medication Reason   ezetimibe (ZETIA) 10 MG tablet    aspirin EC 81 MG EC tablet Discontinued by provider     No orders of the defined types were placed in this encounter.   Orders Placed This Encounter  Procedures   Lipid Panel With LDL/HDL Ratio    Please do not do the test until recommended time that is 6 months from now.    Standing Status:   Future    Number of Occurrences:   1    Standing Expiration Date:   06/03/2023   EKG 12-Lead   Recommendations:   Marissa Lee is a 71 y.o. Caucasian female patient with no significant prior cardiovascular history, hypercholesterolemia presented on 07/27/2021 with unstable angina with substernal chest pain  with radiation to the back, arm and jaw, cardiac catheterization following morning revealed 90% ostial left main and mid left main disease for which he underwent emergent CABG on 07/28/2021 with LIMA to LAD and SVG to OM1.  Patient is here on a 55-monthoffice visit and follow-up.  She presently remains asymptomatic without recurrence of angina pectoris.  There is no clinical evidence of heart failure.  I reviewed external labs, lipids under excellent control as well.  Overall from cardiac standpoint she remained stable, I will see her back in 6 months, previously she was on ezetimibe as well for hyperlipidemia, it appears that she has discontinued this sometime 3 to 4 months ago.  Recent labs are  normal.  She will need repeat lipid profile testing in 6 months.  She has been having significant amount of heartburn, I will discontinue as and continue Plavix alone.  If her symptoms do not improve I advised her to restart aspirin and discontinue Plavix.   JAdrian Prows MD, [facility]9/03/2022, 12:02 PM Office: [phone]

## 2022-06-15 ENCOUNTER — Ambulatory Visit: Payer: Medicare Other | Admitting: Physician Assistant

## 2022-06-15 ENCOUNTER — Other Ambulatory Visit: Payer: Self-pay | Admitting: Cardiology

## 2022-06-15 DIAGNOSIS — K219 Gastro-esophageal reflux disease without esophagitis: Secondary | ICD-10-CM

## 2022-06-20 ENCOUNTER — Encounter: Payer: Self-pay | Admitting: *Deleted

## 2022-07-26 ENCOUNTER — Telehealth: Payer: Self-pay | Admitting: Physician Assistant

## 2022-07-26 NOTE — Telephone Encounter (Signed)
Copied from [place] [id number]. Topic: Medicare AWV >> Jul 26, 2022 10:00 AM Gillis Santa wrote: Reason for CRM: LEFT VOICEMAIL FOR PATIENT TO CALL [phone] TO SCHEDULE AWVI WITH HEALTH COACH

## 2022-07-29 ENCOUNTER — Other Ambulatory Visit: Payer: Self-pay | Admitting: Physician Assistant

## 2022-09-08 ENCOUNTER — Encounter: Payer: Self-pay | Admitting: *Deleted

## 2022-09-14 ENCOUNTER — Other Ambulatory Visit: Payer: Self-pay | Admitting: Cardiology

## 2022-09-14 DIAGNOSIS — K219 Gastro-esophageal reflux disease without esophagitis: Secondary | ICD-10-CM

## 2022-09-26 DIAGNOSIS — C4371 Malignant melanoma of right lower limb, including hip: Secondary | ICD-10-CM

## 2022-09-26 HISTORY — DX: Malignant melanoma of right lower limb, including hip: C43.71

## 2022-10-19 ENCOUNTER — Other Ambulatory Visit: Payer: Self-pay | Admitting: Physician Assistant

## 2022-10-25 ENCOUNTER — Other Ambulatory Visit: Payer: Self-pay | Admitting: Physician Assistant

## 2022-11-12 ENCOUNTER — Other Ambulatory Visit: Payer: Self-pay | Admitting: Cardiology

## 2022-11-14 ENCOUNTER — Other Ambulatory Visit: Payer: Self-pay

## 2022-11-14 MED ORDER — CLOPIDOGREL BISULFATE 75 MG PO TABS: 75.0000 mg | ORAL_TABLET | Freq: Every day | ORAL | 3 refills | Status: DC

## 2022-11-16 ENCOUNTER — Telehealth: Payer: Self-pay

## 2022-11-16 NOTE — Telephone Encounter (Signed)
Patient is having some pain on her scar from her CABG. She says it is red and tender, for about the last week or so. She said she is going through some stress with her daughter divorce. It comes and goes randomly. She describes it as pain right in the middle of her chest on the scar and radiates out about an inch or two outside of the scar.

## 2022-11-16 NOTE — Telephone Encounter (Signed)
Spoke with Dr. Einar Gip about patients incisional pain, he recommended she try aleve for three  days and see if it resolves. He said she is welcome to come in tomorrow, but he thinks it is musculoskeletal pain. I gave patient this information, she said she wants to keep 11/17/22 appt along with taking the aleve and call in the morning with an update on the pain.

## 2022-11-16 NOTE — Telephone Encounter (Signed)
Advised her to try Aleve for 3 days 1 tablet daily but patient is concerned, she has been set up for office visit tomorrow.

## 2022-11-17 ENCOUNTER — Encounter: Payer: Self-pay | Admitting: Cardiology

## 2022-11-17 ENCOUNTER — Ambulatory Visit: Payer: Medicare Other | Admitting: Cardiology

## 2022-11-17 VITALS — BP 147/72 | HR 60 | Resp 16 | Ht 64.0 in | Wt 142.0 lb

## 2022-11-17 DIAGNOSIS — I209 Angina pectoris, unspecified: Secondary | ICD-10-CM

## 2022-11-17 DIAGNOSIS — I25118 Atherosclerotic heart disease of native coronary artery with other forms of angina pectoris: Secondary | ICD-10-CM

## 2022-11-17 MED ORDER — ISOSORBIDE MONONITRATE ER 30 MG PO TB24: 30.0000 mg | ORAL_TABLET | Freq: Every day | ORAL | 2 refills | Status: DC

## 2022-11-17 NOTE — Progress Notes (Signed)
Primary Physician/Referring:  Allwardt, Randa Evens, PA-C  Patient ID: Marissa Lee, female    DOB: [birth year], 72 y.o.   MRN: [id number]  Chief Complaint  Patient presents with   Incisional Pain    HPI:    Marissa Lee  is a 72 y.o. Caucasian female patient with no significant prior cardiovascular history, hypercholesterolemia presented on 07/27/2021 with unstable angina with substernal chest pain with radiation to the back, arm and jaw, cardiac catheterization following morning revealed 90% ostial left main and mid left main disease for which he underwent emergent CABG on 07/28/2021 with LIMA to LAD and SVG to OM1.  Patient called our office and made an urgent appointment, states that over the past few days she has been having discomfort in the middle of her chest and also heartburn feels similar to her prior presentation with angina pectoris.  She denies any associated symptoms of nausea or vomiting.  She has had GERD in the past but she is presently on PPI.  No belching.  No leg edema, no PND or orthopnea.  Past Medical History:  Diagnosis Date   Anxiety    Endometriosis    Fibrocystic breast    muliple drainage   Fibroid    GERD (gastroesophageal reflux disease)    Hiatal hernia    History of bronchitis    Hypercholesteremia 2007   currently not taking anything for it   Osteopenia 08/2016   hip and spine   Subclinical hypothyroidism 10/04/2018   Urge incontinence 06/05/2013   Social History   Tobacco Use   Smoking status: Former    Packs/day: 1.00    Years: 20.00    Total pack years: 20.00    Types: Cigarettes    Quit date: 2000    Years since quitting: 24.1   Smokeless tobacco: Never   Tobacco comments:    quit in approximately 2000  Substance Use Topics   Alcohol use: Yes    Alcohol/week: 3.0 - 4.0 standard drinks of alcohol    Types: 2 Cans of beer, 1 - 2 Standard drinks or equivalent per week    Comment: rarely   Marital Status: Single  ROS  Review of  Systems  Cardiovascular:  Positive for chest pain. Negative for dyspnea on exertion and leg swelling.  Gastrointestinal:  Positive for heartburn. Negative for melena.   Objective  Blood pressure (!) 147/72, pulse 60, resp. rate 16, height 5' 4"$  (1.626 m), weight 142 lb (64.4 kg), last menstrual period 09/27/2003, SpO2 100 %. Body mass index is 24.37 kg/m.     11/17/2022    2:39 PM 06/02/2022   11:08 AM 05/05/2022    8:24 AM  Vitals with BMI  Height 5' 4"$  5' 4"$  5' 4"$   Weight 142 lbs 140 lbs 13 oz 140 lbs  BMI 24.36 [id number]  Systolic [id number] [id number] 123456  Diastolic 72 70 74  Pulse 60 65 59     Physical Exam Neck:     Vascular: No JVD.  Cardiovascular:     Rate and Rhythm: Normal rate and regular rhythm.     Pulses: Intact distal pulses.     Heart sounds: Normal heart sounds. No murmur heard.    No gallop.  Pulmonary:     Effort: Pulmonary effort is normal.     Breath sounds: Normal breath sounds.  Abdominal:     General: Bowel sounds are normal.     Palpations: Abdomen is soft.  Musculoskeletal:     Right  lower leg: No edema.     Left lower leg: No edema.      Laboratory examination:   Recent Labs    11/18/21 1037 04/26/22 1607  NA 143 138  K 4.8 4.3  CL 105 102  CO2 25 25  GLUCOSE 94 56*  BUN 11 14  CREATININE 0.84 0.86  CALCIUM 9.6 9.6  GFRNONAA  --  >60      Latest Ref Rng & Units 04/26/2022    4:07 PM 11/18/2021   10:37 AM 09/07/2021   10:32 AM  CMP  Glucose 70 - 99 mg/dL 56  94  92   BUN 8 - 23 mg/dL 14  11  11   $ Creatinine 0.44 - 1.00 mg/dL 0.86  0.84  0.84   Sodium 135 - 145 mmol/L 138  143  140   Potassium 3.5 - 5.1 mmol/L 4.3  4.8  4.7   Chloride 98 - 111 mmol/L 102  105  104   CO2 22 - 32 mmol/L 25  25  28   $ Calcium 8.9 - 10.3 mg/dL 9.6  9.6  9.6   Total Protein 6.0 - 8.5 g/dL  6.7  7.1   Total Bilirubin 0.0 - 1.2 mg/dL  0.5  0.5   Alkaline Phos 44 - 121 IU/L  62  62   AST 0 - 40 IU/L  24  17   ALT 0 - 32 IU/L  24  16       Latest Ref Rng &  Units 04/26/2022    4:07 PM 11/18/2021   10:37 AM 09/07/2021   10:32 AM  CBC  WBC 4.0 - 10.5 K/uL 9.1  6.5  5.6   Hemoglobin 12.0 - 15.0 g/dL 13.6  12.6  12.0   Hematocrit 36.0 - 46.0 % 40.3  39.1  37.2   Platelets 150 - 400 K/uL 338  381  404.0     Lipid Panel Recent Labs    11/18/21 1037 05/05/22 0909  CHOL 156 172  TRIG 92 85.0  LDLCALC 80 88  VLDL  --  17.0  HDL 59 67.00  CHOLHDL  --  3   Historical lipid profile 10/04/2018: Total cholesterol 398, triglycerides 171, HDL 55, LDL 218.  HEMOGLOBIN A1C Lab Results  Component Value Date   HGBA1C 5.2 04/26/2022   MPG 102.54 07/28/2021   TSH Recent Labs    05/05/22 0909  TSH 3.55   Medications and allergies   Allergies  Allergen Reactions   Cephalosporins Anaphylaxis and Swelling    Throat swells   Clindamycin/Lincomycin     Metallic taste, blisters behind ears   Codeine Itching   Lisinopril Cough    Medication list after today's encounter    Current Outpatient Medications:    atorvastatin (LIPITOR) 40 MG tablet, TAKE 1 TABLET(40 MG) BY MOUTH DAILY, Disp: 90 tablet, Rfl: 3   Calcium-Phosphorus-Vitamin D (CITRACAL +D3 PO), Take 1 tablet by mouth daily., Disp: , Rfl:    clopidogrel (PLAVIX) 75 MG tablet, Take 1 tablet (75 mg total) by mouth daily., Disp: 90 tablet, Rfl: 3   Dextrose, Diabetic Use, (GLUCOSE PO), Take 1 tablet by mouth daily as needed (hypoglycemic episodes)., Disp: , Rfl:    FLUoxetine (PROZAC) 10 MG capsule, TAKE 1 CAPSULE BY MOUTH EVERY DAY WITH FLUOXETINE 40 MG FOR TOTAL DOSAGE OF 50 MG., Disp: 90 capsule, Rfl: 0   FLUoxetine (PROZAC) 40 MG capsule, TAKE 1 CAPSULE BY MOUTH EVERY DAY, Disp: 90 capsule, Rfl: 3  isosorbide mononitrate (IMDUR) 30 MG 24 hr tablet, Take 1 tablet (30 mg total) by mouth daily., Disp: 30 tablet, Rfl: 2   metoprolol succinate (TOPROL-XL) 25 MG 24 hr tablet, Take 0.5 tablets (12.5 mg total) by mouth daily., Disp: 45 tablet, Rfl: 3   nitroGLYCERIN (NITROSTAT) 0.4 MG SL  tablet, Place 1 tablet (0.4 mg total) under the tongue every 5 (five) minutes as needed for up to 25 days for chest pain., Disp: 25 tablet, Rfl: 3   pantoprazole (PROTONIX) 40 MG tablet, TAKE 1 TABLET(40 MG) BY MOUTH EVERY DAY, Disp: 30 tablet, Rfl: 2   Radiology:   Chest x-ray 08/05/2021: Unchanged cardiomediastinal silhouette. Prior median sternotomy and CABG. There are small bilateral pleural effusions and bibasilar opacities, likely atelectasis. Obliquely oriented bandlike opacity in the right lung, similar to prior exam, likely scarring or atelectasis. No visible pneumothorax. No acute osseous abnormality.  Cardiac Studies:   CABG for critical left main disease 07/28/2021: LIMA to LAD and SVG to OM1  Coronary angiogram 07/28/2021: 1. Severe Left Main CAD-ostial 90% with catheter damping and no resolution with IC nitro ? Excellent LAD, diagonal and LCx targets 2. Angiographically normal Right Coronary Artery with RPDA and PL system. 3. Normal/preserved LVEF with normal EDP. 4. Successful placement of 50 cc IABP pump via RFA 8 French sheath  Echocardiogram 07/28/2021:  1. Left ventricular ejection fraction, by estimation, is 60 to 65%. The left ventricle has normal function. The left ventricle has no regional wall motion abnormalities. Left ventricular diastolic parameters are indeterminate.  2. Right ventricular systolic function is normal. The right ventricular size is normal. There is normal pulmonary artery systolic pressure.  3. Left atrial size was mildly dilated.  EKG:   EKG 11/17/2022: Normal sinus rhythm with rate of 60 bpm, left axis deviation, left intrafascicular block.  Incomplete right bundle branch block.  Poor R progression, cannot exclude anteroseptal infarct old.  Normal QT interval.  Nonspecific T abnormality.  Compared to 06/02/2022, no significant change.  Assessment     ICD-10-CM   1. Angina pectoris (HCC)  I20.9 PCV MYOCARDIAL PERFUSION WO LEXISCAN    EKG  12-Lead    2. Coronary artery disease of native artery of native heart with stable angina pectoris (HCC)  I25.118 PCV MYOCARDIAL PERFUSION WO LEXISCAN    isosorbide mononitrate (IMDUR) 30 MG 24 hr tablet       There are no discontinued medications.    Meds ordered this encounter  Medications   isosorbide mononitrate (IMDUR) 30 MG 24 hr tablet    Sig: Take 1 tablet (30 mg total) by mouth daily.    Dispense:  30 tablet    Refill:  2     Orders Placed This Encounter  Procedures   PCV MYOCARDIAL PERFUSION WO LEXISCAN    Standing Status:   Future    Standing Expiration Date:   01/16/2023   EKG 12-Lead   Recommendations:   Marissa Lee is a 72 y.o. Caucasian female patient with no significant prior cardiovascular history, hypercholesterolemia presented on 07/27/2021 with unstable angina with substernal chest pain with radiation to the back, arm and jaw, cardiac catheterization following morning revealed 90% ostial left main and mid left main disease for which he underwent emergent CABG on 07/28/2021 with LIMA to LAD and SVG to OM1.  1. Angina pectoris [facility] [address]) Patient called our office and made an urgent appointment, states that over the past few days she has been having discomfort in the middle of her chest  and also heartburn feels similar to her prior presentation with angina pectoris.  No rest pain comes on and off but she has not been using any sublingual nitroglycerin.  EKG does not reveal any acute ischemic changes, however her symptoms are very concerning.  She is also having a lot of stress at home and has had difficulty with sleep as well.  Chest pain with stress and also with exertion activity as she is taking care of her grandchildren and relieved with rest also suggest new onset angina pectoris since bypass surgery.  I will start her on isosorbide mononitrate 30 mg daily.  Her blood pressure is also elevated today she has never had hypertension diagnosis, suspect this could be  reactive.  I will follow-up on this.  Will also schedule for exercise nuclear stress test.  2. Coronary artery disease of native artery of native heart with stable angina pectoris ([place]) She has been asymptomatic since her bypass surgery without recurrence of chest pain, there is the first episode of chest pain that started about couple weeks ago, on and off, do not suspect unstable angina but clearly has concerning symptoms to proceed with further cardiac testing.    Adrian Prows, MD, [facility] 11/17/2022, 3:35 PM Office: [phone]

## 2022-11-28 ENCOUNTER — Other Ambulatory Visit: Payer: Medicare Other

## 2022-11-29 ENCOUNTER — Telehealth: Payer: Self-pay | Admitting: Physician Assistant

## 2022-11-29 NOTE — Telephone Encounter (Signed)
Copied from [place] [id number] [id number]. Topic: Medicare AWV >> Nov 29, 2022  2:17 PM Gillis Santa wrote: Reason for CRM: Called patient to schedule Medicare Annual Wellness Visit (AWV). Left message for patient to call back and schedule Medicare Annual Wellness Visit (AWV).  Last date of AWV: n/a  Please schedule an appointment at any time with Otila Kluver, [facility].  If any questions, please contact me at [phone].  Thank you ,  Shaune Pollack [facility] AWV TEAM Direct Dial [phone]

## 2022-12-05 ENCOUNTER — Ambulatory Visit: Payer: Medicare Other | Admitting: Cardiology

## 2022-12-08 ENCOUNTER — Other Ambulatory Visit: Payer: Self-pay | Admitting: Physician Assistant

## 2022-12-13 ENCOUNTER — Telehealth: Payer: Self-pay

## 2022-12-13 NOTE — Telephone Encounter (Signed)
Patient has stomach bug, can't eat but can drink water. She is afraid to take heart medications due to throwing up. Is it ok to skip medications until she is over throwing up?

## 2022-12-13 NOTE — Telephone Encounter (Signed)
yes

## 2022-12-14 ENCOUNTER — Other Ambulatory Visit: Payer: Self-pay

## 2022-12-14 ENCOUNTER — Telehealth: Payer: Self-pay | Admitting: Physician Assistant

## 2022-12-14 ENCOUNTER — Other Ambulatory Visit: Payer: Self-pay | Admitting: Cardiology

## 2022-12-14 DIAGNOSIS — K219 Gastro-esophageal reflux disease without esophagitis: Secondary | ICD-10-CM

## 2022-12-14 DIAGNOSIS — I2510 Atherosclerotic heart disease of native coronary artery without angina pectoris: Secondary | ICD-10-CM

## 2022-12-14 MED ORDER — NITROGLYCERIN 0.4 MG SL SUBL: 0.4000 mg | SUBLINGUAL_TABLET | SUBLINGUAL | 3 refills | Status: DC | PRN

## 2022-12-14 NOTE — Telephone Encounter (Signed)
Patient aware.

## 2022-12-14 NOTE — Telephone Encounter (Signed)
Copied from [place] [id number]. Topic: Medicare AWV >> Dec 14, 2022 11:09 AM Gillis Santa wrote: Reason for CRM: Called patient to schedule Medicare Annual Wellness Visit (AWV). Left message for patient to call back and schedule Medicare Annual Wellness Visit (AWV).  Last date of AWV: N/A  Please schedule an appointment at any time with Otila Kluver, [facility].  Please schedule AWVI with Manual Meier, [facility].  If any questions, please contact me at [phone].  Thank you ,  Shaune Pollack [facility] AWV TEAM Direct Dial [phone]

## 2022-12-26 ENCOUNTER — Other Ambulatory Visit: Payer: Medicare Other

## 2023-01-05 ENCOUNTER — Ambulatory Visit: Payer: Medicare Other

## 2023-01-05 DIAGNOSIS — I25118 Atherosclerotic heart disease of native coronary artery with other forms of angina pectoris: Secondary | ICD-10-CM

## 2023-01-05 DIAGNOSIS — I209 Angina pectoris, unspecified: Secondary | ICD-10-CM

## 2023-01-09 LAB — PCV MYOCARDIAL PERFUSION WO LEXISCAN: ST Depression (mm): 0 mm

## 2023-01-10 ENCOUNTER — Encounter: Payer: Self-pay | Admitting: Physician Assistant

## 2023-01-10 ENCOUNTER — Ambulatory Visit: Payer: Medicare Other | Admitting: Cardiology

## 2023-01-10 ENCOUNTER — Encounter: Payer: Self-pay | Admitting: Cardiology

## 2023-01-10 ENCOUNTER — Ambulatory Visit: Payer: Medicare Other | Admitting: Physician Assistant

## 2023-01-10 VITALS — BP 126/72 | HR 70 | Resp 16 | Ht 64.0 in | Wt 145.6 lb

## 2023-01-10 VITALS — BP 149/73 | HR 64 | Temp 97.5°F | Ht 64.0 in | Wt 145.0 lb

## 2023-01-10 DIAGNOSIS — R252 Cramp and spasm: Secondary | ICD-10-CM

## 2023-01-10 DIAGNOSIS — R202 Paresthesia of skin: Secondary | ICD-10-CM | POA: Diagnosis not present

## 2023-01-10 DIAGNOSIS — R296 Repeated falls: Secondary | ICD-10-CM | POA: Diagnosis not present

## 2023-01-10 DIAGNOSIS — G4762 Sleep related leg cramps: Secondary | ICD-10-CM

## 2023-01-10 DIAGNOSIS — I25118 Atherosclerotic heart disease of native coronary artery with other forms of angina pectoris: Secondary | ICD-10-CM

## 2023-01-10 DIAGNOSIS — R29898 Other symptoms and signs involving the musculoskeletal system: Secondary | ICD-10-CM | POA: Diagnosis not present

## 2023-01-10 DIAGNOSIS — R030 Elevated blood-pressure reading, without diagnosis of hypertension: Secondary | ICD-10-CM

## 2023-01-10 MED ORDER — ISOSORBIDE MONONITRATE ER 30 MG PO TB24: 30.0000 mg | ORAL_TABLET | Freq: Every day | ORAL | 3 refills | Status: DC

## 2023-01-10 NOTE — Patient Instructions (Addendum)
Thanks for coming in today!  Please have labs checked today.   Referral to PT at our office.  Rx for LifeAlert or equivalent.  Fall precautions - see handout   Monitor BP readings at home, please.

## 2023-01-10 NOTE — Progress Notes (Signed)
Subjective:    Patient ID: Marissa Lee, female    DOB: 1951/07/28, 72 y.o.   MRN: [id number]  Chief Complaint  Patient presents with   Fall    Pt in office due to fall; pt states went to see Dr Jacinto Halim and follow up from stress test; he has scheduled pt for ABI's for BIL leg cramps; pt interested in life alert; pt states left leg is just giving out with no sign it's going to happen    Fall   Patient is in today for f/up fall 12/23/22. She was having cramps in her legs, went to get out of the bed and felt like left leg completely gave out on her. Fell back onto wooden steps, hurt her back and left forearm. XRAYs of left elbow, forearm, and lower back were unremarkable.  She felt kind of tired and winded while walking down to the beach that weekend, states she stayed close to the hotel and pool.  The following week, went down the steps at her house, no warning or anything, felt like leg gave out again, she fell down and hit her left wrist and left forearm. Didn't go to urgent care or ED that day. Didn't hit her head.   F/up with Dr. Jacinto Halim today - nuclear stress test was normal. Will check for PAD, ABIs scheduled for 4/30.   Denies any CP or SOB. No frequent or recurrent headaches. No dizziness. No major pain in left leg, hip, or knee. Complains of bilateral leg cramping at night, not during the day. Doesn't have any pain with walking.   Today so far has had Belvita crackers, Coke, and Activia yogurt.   January - March 2024 this year fairly sedentary due to melanoma removal on R second toe. Had to wear a boot for 8-10 weeks.  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   Osteopenia 08/2016   hip and spine   Subclinical  hypothyroidism 10/04/2018   Urge incontinence 06/05/2013    Past Surgical History:  Procedure Laterality Date   ABDOMINAL HYSTERECTOMY     BLADDER SUSPENSION  2004   done with [facility]   BREAST BIOPSY  1980   done 3x   CLOSED REDUCTION METACARPAL WITH PERCUTANEOUS PINNING Right 08/24/2016   Procedure: CLOSED REDUCTION  WITH PERCUTANEOUS PINNING;  Surgeon: Bradly Bienenstock, MD;  Location: [facility] OR;  Service: Orthopedics;  Laterality: Right;   COLONOSCOPY     COMBINED HYSTERECTOMY VAGINAL / OOPHORECTOMY / A&P REPAIR  2004   CORONARY ARTERY BYPASS GRAFT N/A 07/28/2021   Procedure: CORONARY ARTERY BYPASS GRAFTING (CABG) times two using left internal mammary artery and  left leg saphenous vein;  Surgeon: Loreli Slot, MD;  Location:  [facility] OR;  Service: Open Heart Surgery;  Laterality: N/A;   FOOT SURGERY Right 1992   IABP INSERTION Right 07/28/2021   Procedure: IABP Insertion;  Surgeon: Marykay Lex, MD;  Location: [facility] INVASIVE CV LAB;  Service: Cardiovascular;  Laterality: Right;   LEFT HEART CATH AND CORONARY ANGIOGRAPHY N/A 07/28/2021   Procedure: LEFT HEART CATH AND CORONARY ANGIOGRAPHY;  Surgeon: Marykay Lex, MD;  Location: [facility] INVASIVE CV LAB;  Service:  Cardiovascular;  Laterality: N/A;   OPEN REDUCTION INTERNAL FIXATION (ORIF) HAND Right 08/24/2016   Procedure: RIGHT HAND LONG RING AND SMALL FINGER  POSSIBLE ORIF;  Surgeon: Bradly Bienenstock, MD;  Location: [facility] OR;  Service: Orthopedics;  Laterality: Right;   WISDOM TOOTH EXTRACTION  1978    Family History  Problem Relation Age of Onset   Cancer Paternal Grandmother        Colon    Cancer Paternal Grandfather        Lung   Heart disease Mother        valvular disease, valve replacement   Atrial fibrillation Mother    Stroke Mother    Heart disease Brother        valve disease, valve surgery   Thyroid disease Maternal Grandmother        Goiter   Thyroid disease Daughter        Hypothyroid    Social History   Tobacco Use    Smoking status: Former    Packs/day: 1.00    Years: 20.00    Additional pack years: 0.00    Total pack years: 20.00    Types: Cigarettes    Quit date: 2000    Years since quitting: 24.3   Smokeless tobacco: Never   Tobacco comments:    quit in approximately 2000  Vaping Use   Vaping Use: Never used  Substance Use Topics   Alcohol use: Yes    Alcohol/week: 3.0 - 4.0 standard drinks of alcohol    Types: 2 Cans of beer, 1 - 2 Standard drinks or equivalent per week    Comment: rarely   Drug use: No     Allergies  Allergen Reactions   Cephalosporins Anaphylaxis and Swelling    Throat swells   Clindamycin/Lincomycin     Metallic taste, blisters behind ears   Codeine Itching   Lisinopril Cough    Review of Systems NEGATIVE UNLESS OTHERWISE INDICATED IN HPI      Objective:     BP (!) 149/73   Pulse 64   Temp (!) 97.5 F (36.4 C) (Temporal)   Ht 5\' 4"  (1.626 m)   Wt 145 lb (65.8 kg)   LMP 09/27/2003   SpO2 98%   BMI 24.89 kg/m   Wt Readings from Last 3 Encounters:  01/10/23 145 lb (65.8 kg)  01/10/23 145 lb 9.6 oz (66 kg)  11/17/22 142 lb (64.4 kg)    BP Readings from Last 3 Encounters:  01/10/23 (!) 149/73  01/10/23 126/72  11/17/22 (!) 147/72     Physical Exam Vitals and nursing note reviewed.  Constitutional:      Appearance: Normal appearance.  Cardiovascular:     Rate and Rhythm: Normal rate and regular rhythm.     Pulses: Normal pulses.  Pulmonary:     Effort: Pulmonary effort is normal.     Breath sounds: Normal breath sounds.  Musculoskeletal:     Right lower leg: No edema.     Left lower leg: No edema.  Skin:    Findings: Lesion (healed surgical lesion R second toe from cancer removal) present. No rash.  Neurological:     General: No focal deficit present.     Mental Status: She is alert and oriented to person, place, and time.     Cranial Nerves: No cranial nerve deficit.     Motor: Weakness (equal in lower BLE, able to push / pull  against me) present.     Gait:  Gait normal.     Comments: Occasional word-finding  Psychiatric:        Mood and Affect: Mood normal.        Assessment & Plan:  Recurrent falls -     For home use only DME Other see comment -     CBC with Differential/Platelet -     Comprehensive metabolic panel -     Magnesium -     IBC + Ferritin -     Vitamin B12 -     Ambulatory referral to Physical Therapy  Nocturnal leg cramps -     CBC with Differential/Platelet -     Comprehensive metabolic panel -     Magnesium -     IBC + Ferritin -     Vitamin B12 -     Ambulatory referral to Physical Therapy  Paresthesia -     CBC with Differential/Platelet -     Comprehensive metabolic panel -     Magnesium -     IBC + Ferritin -     Vitamin B12 -     Ambulatory referral to Physical Therapy  Left leg weakness  Elevated blood pressure reading   I personally reviewed pt's recent urgent care visit notes and imaging reports. After discussion with pt, I think she may have some flare-up of radiculopathy contributing to LE weakness, especially on left side - probably started during her sedentary time / boot-use after cancer removal on R toe. Recommend PT for strengthening.  Labs to r/o organic causes for cramps at night.  Drink water. Fall precautions. Monitor BP at home, elevated in office today.   Recheck sooner if needed.     Return in about 4 weeks (around 02/07/2023) for recheck .  This note was prepared with assistance of Conservation officer, historic buildings. Occasional wrong-word or sound-a-like substitutions may have occurred due to the inherent limitations of voice recognition software.  Time Spent: 39 minutes of total time was spent on the date of the encounter performing the following actions: chart review prior to seeing the patient, obtaining history, performing a medically necessary exam, counseling on the treatment plan, placing orders, and documenting in our EHR.        Yolinda Duerr M Jeovanni Heuring, PA-C

## 2023-01-10 NOTE — Progress Notes (Signed)
Primary Physician/Referring:  Allwardt, Crist Infante, PA-C  Patient ID: Marissa Lee, female    DOB: 03/11/1951, 72 y.o.   MRN: [id number]  Chief Complaint  Patient presents with   Chest Pain   Results    Stress test    Hypertension   Coronary Artery Disease   Follow-up    HPI:    Marissa Lee  is a 72 y.o. Caucasian female patient coronary disease SP CABG on 07/28/2021 for critical left main disease when she presented with substernal chest pain, hypercholesterolemia, was seen by me on 11/17/2022 with recurrence of chest pain, underwent stress testing and presents for follow-up.  Patient states that since starting isosorbide mononitrate, she has not had any further chest pain in the form of burning sensation.  Patient states that she has had about 2 or 3 falls recently which are unexplained, looks like her left leg gives away and she has fallen down.  She does not admit to loss of consciousness but states that she just does not know what happens.  History was difficult.   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   Osteopenia 08/2016   hip and spine   Subclinical hypothyroidism 10/04/2018   Urge incontinence 06/05/2013   Social History   Tobacco Use   Smoking status: Former    Packs/day: 1.00    Years: 20.00    Additional pack years: 0.00    Total pack years: 20.00    Types: Cigarettes    Quit date: 2000    Years since quitting: 24.3   Smokeless tobacco: Never   Tobacco comments:    quit in approximately 2000  Substance Use Topics   Alcohol use: Yes    Alcohol/week: 3.0 - 4.0 standard drinks of alcohol    Types: 2 Cans of beer, 1 - 2 Standard drinks or equivalent per week    Comment: rarely   Marital Status:  Single  ROS  Review of Systems  Cardiovascular:  Negative for chest pain, dyspnea on exertion and leg swelling.  Gastrointestinal:  Negative for heartburn and melena.   Objective  Blood pressure 126/72, pulse 70, resp. rate 16, height 5\' 4"  (1.626 m), weight 145 lb 9.6 oz (66 kg), last menstrual period 09/27/2003, SpO2 98 %. Body mass index is 24.99 kg/m.     01/10/2023    2:54 PM 01/10/2023    2:12 PM 01/10/2023   10:27 AM  Vitals with BMI  Height  5\' 4"  5\' 4"   Weight  145 lbs 145 lbs 10 oz  BMI  24.88 24.98  Systolic 149 158 782  Diastolic 73 78 72  Pulse  64 70     Physical Exam Neck:     Vascular: No carotid bruit or JVD.  Cardiovascular:     Rate and Rhythm: Normal rate and regular rhythm.     Pulses: Intact distal pulses.          Carotid pulses are 2+ on the right side and 2+ on the left side.      Popliteal pulses are 1+ on the right side and 1+ on the left side.       Dorsalis  pedis pulses are 0 on the right side and 1+ on the left side.       Posterior tibial pulses are 0 on the right side and 0 on the left side.     Heart sounds: Normal heart sounds. No murmur heard.    No gallop.  Pulmonary:     Effort: Pulmonary effort is normal.     Breath sounds: Normal breath sounds.  Abdominal:     General: Bowel sounds are normal.     Palpations: Abdomen is soft.  Musculoskeletal:     Right lower leg: No edema.     Left lower leg: No edema.    Laboratory examination:   Recent Labs    04/26/22 1607 01/10/23 1503  NA 138 140  K 4.3 5.0  CL 102 104  CO2 25 29  GLUCOSE 56* 112*  BUN 14 13  CREATININE 0.86 0.84  CALCIUM 9.6 9.4  GFRNONAA >60  --       Latest Ref Rng & Units 01/10/2023    3:03 PM 04/26/2022    4:07 PM 11/18/2021   10:37 AM  CMP  Glucose 70 - 99 mg/dL 409  56  94   BUN 6 - 23 mg/dL 13  14  11    Creatinine 0.40 - 1.20 mg/dL 8.11  9.14  7.82   Sodium 135 - 145 mEq/L 140  138  143   Potassium 3.5 - 5.1 mEq/L 5.0  4.3  4.8   Chloride 96 - 112  mEq/L 104  102  105   CO2 19 - 32 mEq/L 29  25  25    Calcium 8.4 - 10.5 mg/dL 9.4  9.6  9.6   Total Protein 6.0 - 8.3 g/dL 6.7   6.7   Total Bilirubin 0.2 - 1.2 mg/dL 0.6   0.5   Alkaline Phos 39 - 117 U/L 61   62   AST 0 - 37 U/L 19   24   ALT 0 - 35 U/L 16   24       Latest Ref Rng & Units 01/10/2023    3:03 PM 04/26/2022    4:07 PM 11/18/2021   10:37 AM  CBC  WBC 4.0 - 10.5 K/uL 6.2  9.1  6.5   Hemoglobin 12.0 - 15.0 g/dL 95.6  21.3  08.6   Hematocrit 36.0 - 46.0 % 40.4  40.3  39.1   Platelets 150.0 - 400.0 K/uL 348.0  338  381     Lipid Panel Recent Labs    05/05/22 0909  CHOL 172  TRIG 85.0  LDLCALC 88  VLDL 17.0  HDL 67.00  CHOLHDL 3   Historical lipid profile 10/04/2018: Total cholesterol 398, triglycerides 171, HDL 55, LDL 218.  HEMOGLOBIN A1C Lab Results  Component Value Date   HGBA1C 5.2 04/26/2022   MPG 102.54 07/28/2021   TSH Recent Labs    05/05/22 0909  TSH 3.55   Medications and allergies   Allergies  Allergen Reactions   Cephalosporins Anaphylaxis and Swelling    Throat swells   Clindamycin/Lincomycin     Metallic taste, blisters behind ears   Codeine Itching   Lisinopril Cough    Medication list after today's encounter    Current Outpatient Medications:    atorvastatin (LIPITOR) 40 MG tablet, TAKE 1 TABLET(40 MG) BY MOUTH DAILY, Disp: 90 tablet, Rfl: 3   Calcium-Phosphorus-Vitamin D (CITRACAL +D3 PO), Take 1 tablet by mouth daily., Disp: , Rfl:    clopidogrel (PLAVIX)  75 MG tablet, Take 1 tablet (75 mg total) by mouth daily., Disp: 90 tablet, Rfl: 3   Dextrose, Diabetic Use, (GLUCOSE PO), Take 1 tablet by mouth daily as needed (hypoglycemic episodes)., Disp: , Rfl:    FLUoxetine (PROZAC) 10 MG capsule, TAKE 1 CAPSULE BY MOUTH EVERY DAY WITH FLUOXETINE 40 MG FOR TOTAL DOSAGE OF 50 MG., Disp: 90 capsule, Rfl: 0   FLUoxetine (PROZAC) 40 MG capsule, TAKE 1 CAPSULE BY MOUTH EVERY DAY, Disp: 90 capsule, Rfl: 3   metoprolol succinate (TOPROL-XL)  25 MG 24 hr tablet, Take 0.5 tablets (12.5 mg total) by mouth daily., Disp: 45 tablet, Rfl: 3   nitroGLYCERIN (NITROSTAT) 0.4 MG SL tablet, Place 1 tablet (0.4 mg total) under the tongue every 5 (five) minutes as needed for up to 25 days for chest pain., Disp: 25 tablet, Rfl: 3   pantoprazole (PROTONIX) 40 MG tablet, TAKE 1 TABLET(40 MG) BY MOUTH EVERY DAY, Disp: 90 tablet, Rfl: 2   isosorbide mononitrate (IMDUR) 30 MG 24 hr tablet, Take 1 tablet (30 mg total) by mouth daily., Disp: 90 tablet, Rfl: 3   Radiology:   Chest x-ray 08/05/2021: Unchanged cardiomediastinal silhouette. Prior median sternotomy and CABG. There are small bilateral pleural effusions and bibasilar opacities, likely atelectasis. Obliquely oriented bandlike opacity in the right lung, similar to prior exam, likely scarring or atelectasis. No visible pneumothorax. No acute osseous abnormality.  Cardiac Studies:   CABG for critical left main disease 07/28/2021: LIMA to LAD and SVG to OM1  Coronary angiogram 07/28/2021: 1. Severe Left Main CAD-ostial 90% with catheter damping and no resolution with IC nitro ? Excellent LAD, diagonal and LCx targets 2. Angiographically normal Right Coronary Artery with RPDA and PL system. 3. Normal/preserved LVEF with normal EDP. 4. Successful placement of 50 cc IABP pump via RFA 8 French sheath  Echocardiogram 07/28/2021:  1. Left ventricular ejection fraction, by estimation, is 60 to 65%. The left ventricle has normal function. The left ventricle has no regional wall motion abnormalities. Left ventricular diastolic parameters are indeterminate.  2. Right ventricular systolic function is normal. The right ventricular size is normal. There is normal pulmonary artery systolic pressure.  3. Left atrial size was mildly dilated.  PCV MYOCARDIAL PERFUSION WO LEXISCAN 01/05/2023  Narrative Regadenoson  Nuclear stress test 01/05/2023: Myocardial perfusion is normal. Overall LV systolic function  is normal without regional wall motion abnormalities. Stress LV EF: 72%. Nondiagnostic ECG stress. The heart rate response was consistent with Regadenoson. No previous exam available for comparison. Low risk.    EKG:   EKG 11/17/2022: Normal sinus rhythm with rate of 60 bpm, left axis deviation, left intrafascicular block.  Incomplete right bundle branch block.  Poor R progression, cannot exclude anteroseptal infarct old.  Normal QT interval.  Nonspecific T abnormality.  Compared to 06/02/2022, no significant change.  Assessment     ICD-10-CM   1. Coronary artery disease of native artery of native heart with stable angina pectoris  I25.118 isosorbide mononitrate (IMDUR) 30 MG 24 hr tablet    2. Bilateral leg cramps  R25.2 PCV ANKLE BRACHIAL INDEX (ABI)    3. Frequent falls  R29.6        Medications Discontinued During This Encounter  Medication Reason   isosorbide mononitrate (IMDUR) 30 MG 24 hr tablet Reorder     Meds ordered this encounter  Medications   isosorbide mononitrate (IMDUR) 30 MG 24 hr tablet    Sig: Take 1 tablet (30 mg total) by mouth  daily.    Dispense:  90 tablet    Refill:  3   No orders of the defined types were placed in this encounter.  Recommendations:   Raziah Grelle is a 72 y.o. Caucasian female patient coronary disease SP CABG on 07/28/2021 for critical left main disease when she presented with substernal chest pain, hypercholesterolemia, was seen by me on 11/17/2022 with recurrence of chest pain, underwent stress testing and presents for follow-up.  1. Coronary artery disease of native artery of native heart with stable angina pectoris I have seen her last and started on isosorbide mononitrate, she has not had any recurrence of chest pain or heart burning sensation which is her anginal equivalent.  For now continue the same.  I reviewed the results of the stress test which is low risk. - isosorbide mononitrate (IMDUR) 30 MG 24 hr tablet; Take 1 tablet (30  mg total) by mouth daily.  Dispense: 90 tablet; Refill: 3  2. Bilateral leg cramps Patient's overnight had 3 falls, feels like her left leg suddenly gives up on her.  She also has leg cramps.  She has absent pedal pulses bilaterally and popliteal pulse on the left is 2+ but on the right is very feeble.  Would like to obtain ABI to exclude significant PAD.  Advised her to walk on a daily basis on flat ground to see whether she would have any reproducible symptoms of claudication or claudication equivalent.  Also advised her to use a cane to support herself and also advised her that as she has not been exercising regularly, at least for the next few times, to ask her daughter to be with her when she goes for a walk.  It appears that most of her fall has been when she suddenly stands up  - PCV ANKLE BRACHIAL INDEX (ABI); Future  Otherwise she is presently on appropriate medical therapy, lipids are well-controlled, I will see her back in 6 months or sooner if problems.   Yates Decamp, MD, [facility] 01/11/2023, 1:23 PM Office: [phone]

## 2023-01-11 ENCOUNTER — Encounter: Payer: Self-pay | Admitting: Physician Assistant

## 2023-01-11 LAB — CBC WITH DIFFERENTIAL/PLATELET
Basophils Absolute: 0 10*3/uL (ref 0.0–0.1)
Basophils Relative: 0.7 % (ref 0.0–3.0)
Eosinophils Absolute: 0.1 10*3/uL (ref 0.0–0.7)
Eosinophils Relative: 2.1 % (ref 0.0–5.0)
HCT: 40.4 % (ref 36.0–46.0)
Hemoglobin: 13.5 g/dL (ref 12.0–15.0)
Lymphocytes Relative: 30 % (ref 12.0–46.0)
Lymphs Abs: 1.9 10*3/uL (ref 0.7–4.0)
MCHC: 33.4 g/dL (ref 30.0–36.0)
MCV: 95.7 fl (ref 78.0–100.0)
Monocytes Absolute: 0.6 10*3/uL (ref 0.1–1.0)
Monocytes Relative: 9.7 % (ref 3.0–12.0)
Neutro Abs: 3.6 10*3/uL (ref 1.4–7.7)
Neutrophils Relative %: 57.5 % (ref 43.0–77.0)
Platelets: 348 10*3/uL (ref 150.0–400.0)
RBC: 4.22 Mil/uL (ref 3.87–5.11)
RDW: 13.4 % (ref 11.5–15.5)
WBC: 6.2 10*3/uL (ref 4.0–10.5)

## 2023-01-11 LAB — MAGNESIUM: Magnesium: 1.8 mg/dL (ref 1.5–2.5)

## 2023-01-11 LAB — IBC + FERRITIN
Ferritin: 31.4 ng/mL (ref 10.0–291.0)
Iron: 98 ug/dL (ref 42–145)
Saturation Ratios: 22.4 % (ref 20.0–50.0)
TIBC: 438.2 ug/dL (ref 250.0–450.0)
Transferrin: 313 mg/dL (ref 212.0–360.0)

## 2023-01-11 LAB — COMPREHENSIVE METABOLIC PANEL
ALT: 16 U/L (ref 0–35)
AST: 19 U/L (ref 0–37)
Albumin: 4.2 g/dL (ref 3.5–5.2)
Alkaline Phosphatase: 61 U/L (ref 39–117)
BUN: 13 mg/dL (ref 6–23)
CO2: 29 mEq/L (ref 19–32)
Calcium: 9.4 mg/dL (ref 8.4–10.5)
Chloride: 104 mEq/L (ref 96–112)
Creatinine, Ser: 0.84 mg/dL (ref 0.40–1.20)
GFR: 70.43 mL/min (ref >60.00)
Glucose, Bld: 112 mg/dL — ABNORMAL HIGH (ref 70–99)
Potassium: 5 mEq/L (ref 3.5–5.1)
Sodium: 140 mEq/L (ref 135–145)
Total Bilirubin: 0.6 mg/dL (ref 0.2–1.2)
Total Protein: 6.7 g/dL (ref 6.0–8.3)

## 2023-01-11 LAB — VITAMIN B12: Vitamin B-12: 182 pg/mL — ABNORMAL LOW (ref 211–911)

## 2023-01-12 ENCOUNTER — Ambulatory Visit (INDEPENDENT_AMBULATORY_CARE_PROVIDER_SITE_OTHER): Payer: Medicare Other | Admitting: *Deleted

## 2023-01-12 DIAGNOSIS — E538 Deficiency of other specified B group vitamins: Secondary | ICD-10-CM | POA: Diagnosis not present

## 2023-01-12 MED ORDER — CYANOCOBALAMIN 1000 MCG/ML IJ SOLN
1000.0000 ug | Freq: Once | INTRAMUSCULAR | Status: AC
  Administered 2023-01-12: 1000 ug via INTRAMUSCULAR

## 2023-01-12 MED ORDER — CYANOCOBALAMIN 1000 MCG/ML IJ SOLN: 1000.0000 ug | Freq: Once | INTRAMUSCULAR | Status: DC

## 2023-01-12 NOTE — Telephone Encounter (Signed)
Please contact patient and schedule for nurse visits to start B-12 protocol per PCP  1 per week x 4 weeks then 1 per mon for 3 months after we will recheck labs. Thanks

## 2023-01-12 NOTE — Progress Notes (Signed)
Per orders of  Alyssa Allwardt, PA-C, injection of B 12 given in left deltoid per patient preference by Jobe Gibbon, CMA. Patient tolerated injection well. Patient reminded to schedule next injection.

## 2023-01-12 NOTE — Telephone Encounter (Signed)
Please see pt msg and advise if ok to schedule for B-12 protocol in office

## 2023-01-16 NOTE — Telephone Encounter (Signed)
Called pt and lvm with cb number to call office to schedule Nurse visits for B-12 protocol

## 2023-01-18 ENCOUNTER — Ambulatory Visit (INDEPENDENT_AMBULATORY_CARE_PROVIDER_SITE_OTHER): Payer: Medicare Other

## 2023-01-18 ENCOUNTER — Telehealth: Payer: Self-pay

## 2023-01-18 ENCOUNTER — Encounter: Payer: Self-pay | Admitting: Physician Assistant

## 2023-01-18 DIAGNOSIS — E538 Deficiency of other specified B group vitamins: Secondary | ICD-10-CM

## 2023-01-18 MED ORDER — CYANOCOBALAMIN 1000 MCG/ML IJ SOLN
1000.0000 ug | Freq: Once | INTRAMUSCULAR | Status: AC
  Administered 2023-01-18: 1000 ug via INTRAMUSCULAR

## 2023-01-18 NOTE — Telephone Encounter (Signed)
Called pt and lvm advising letter completed and up in front office for pick up at her convenience. Also, advised cb number if patient is interested I starting physical therapy in our office so we can place referral. Pt also advised she can also send MyChart message to let us know if she is agreeable to referral.

## 2023-01-18 NOTE — Telephone Encounter (Signed)
Pt was here today for B12 injection. Pt wanted to know if Alyssa Allwardt could write a letter for her to do balance classes at [facility] at [facility] or send a referral for her to start.   Please advise.

## 2023-01-18 NOTE — Progress Notes (Signed)
Pt here for B12 injection for [name], PA. Injection given in right deltoid. Pt tolerated well.

## 2023-01-24 ENCOUNTER — Encounter: Payer: Self-pay | Admitting: Physical Therapy

## 2023-01-24 ENCOUNTER — Ambulatory Visit: Payer: Medicare Other

## 2023-01-24 ENCOUNTER — Ambulatory Visit: Payer: Medicare Other | Admitting: Physical Therapy

## 2023-01-24 ENCOUNTER — Ambulatory Visit (INDEPENDENT_AMBULATORY_CARE_PROVIDER_SITE_OTHER): Payer: Medicare Other

## 2023-01-24 DIAGNOSIS — M6281 Muscle weakness (generalized): Secondary | ICD-10-CM | POA: Diagnosis not present

## 2023-01-24 DIAGNOSIS — R2689 Other abnormalities of gait and mobility: Secondary | ICD-10-CM | POA: Diagnosis not present

## 2023-01-24 DIAGNOSIS — E538 Deficiency of other specified B group vitamins: Secondary | ICD-10-CM

## 2023-01-24 DIAGNOSIS — R252 Cramp and spasm: Secondary | ICD-10-CM

## 2023-01-24 MED ORDER — CYANOCOBALAMIN 1000 MCG/ML IJ SOLN
1000.0000 ug | Freq: Once | INTRAMUSCULAR | Status: AC
  Administered 2023-01-24: 1000 ug via INTRAMUSCULAR

## 2023-01-24 NOTE — Therapy (Signed)
OUTPATIENT PHYSICAL THERAPY LOWER EXTREMITY EVALUATION   Patient Name: Marissa Lee MRN: [id number] DOB:September 12, 1951, 72 y.o., female Today's Date: 01/24/2023  END OF SESSION:  PT End of Session - 01/24/23 2031     Visit Number 1    Number of Visits 16    Date for PT Re-Evaluation 03/21/23    Authorization Type [employer] Medicare    PT Start Time 1109    PT Stop Time 1147    PT Time Calculation (min) 38 min    Equipment Utilized During Treatment Gait belt    Activity Tolerance Patient tolerated treatment well             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/2022   Osteopenia 08/2016   hip and spine   Subclinical hypothyroidism 10/04/2018   Urge incontinence 06/05/2013   Past Surgical History:  Procedure Laterality Date   ABDOMINAL HYSTERECTOMY     BLADDER SUSPENSION  2004   done with [facility]   BREAST BIOPSY  1980   done 3x   CLOSED REDUCTION METACARPAL WITH PERCUTANEOUS PINNING Right 08/24/2016   Procedure: CLOSED REDUCTION  WITH PERCUTANEOUS PINNING;  Surgeon: Bradly Bienenstock, MD;  Location: [facility] OR;  Service: Orthopedics;  Laterality: Right;   COLONOSCOPY     COMBINED HYSTERECTOMY VAGINAL / OOPHORECTOMY / A&P REPAIR  2004   CORONARY ARTERY BYPASS GRAFT N/A 07/28/2021   Procedure: CORONARY ARTERY BYPASS GRAFTING (CABG) times two using left internal mammary artery and  left leg saphenous vein;  Surgeon: Loreli Slot, MD;  Location: [facility] OR;  Service: Open Heart Surgery;  Laterality: N/A;   FOOT SURGERY Right 1992   IABP INSERTION Right 07/28/2021   Procedure: IABP Insertion;  Surgeon: Marykay Lex, MD;  Location: [facility] INVASIVE CV LAB;  Service: Cardiovascular;  Laterality: Right;   LEFT HEART CATH AND  CORONARY ANGIOGRAPHY N/A 07/28/2021   Procedure: LEFT HEART CATH AND CORONARY ANGIOGRAPHY;  Surgeon: Marykay Lex, MD;  Location: [facility] INVASIVE CV LAB;  Service: Cardiovascular;  Laterality: N/A;   OPEN REDUCTION INTERNAL FIXATION (ORIF) HAND Right 08/24/2016   Procedure: RIGHT HAND LONG RING AND SMALL FINGER  POSSIBLE ORIF;  Surgeon: Bradly Bienenstock, MD;  Location: [facility] OR;  Service: Orthopedics;  Laterality: Right;   WISDOM TOOTH EXTRACTION  1978   Patient Active Problem List   Diagnosis Date Noted   Recurrent falls 01/10/2023   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   Unstable angina (HCC) 07/27/2021   Hiatal hernia 10/30/2019   Subclinical hypothyroidism 10/04/2018   Low back pain radiating to left lower extremity 04/26/2018   Anxiety 09/29/2017   Osteopenia 09/28/2016   Hyperlipidemia 09/28/2016   Hypersomnia, persistent 07/20/2015    PCP: Arlyss Repress Allwardt  REFERRING PROVIDER: Arlyss Repress Allwardt  REFERRING DIAG: Recurrent falls   THERAPY DIAG:  Other abnormalities of gait and mobility  Muscle weakness (generalized)  Rationale for Evaluation and Treatment: Rehabilitation  ONSET DATE: 1- 2months ago  SUBJECTIVE:   SUBJECTIVE STATEMENT: Pt with at least 2-3 recent falls. She feels like leg is giving out on her but is unable to pinpoint what is happening. Always lands on L side/hand , so she  thinks its her L leg.  No previous L knee issues, does report some cramping in L LE recently  more when walking on uneven ground at beach She Has had back issues in the past with previous injection. About 10 yrs ago, but reports no back pain now or since then.  Denies any dizziness, lightheadedness, etc.  She had heart surgery about 1 year ago, then had cancer removal on R toe in Jan/feb. She wore boot for a while on foot.  Reports being more sedentary lately due to these surgeries. She has not been able to return to her regular activity level since then.  She Did have recent stress test , states good  results.    PERTINENT HISTORY:  PAIN:  Are you having pain? No  PRECAUTIONS: None  WEIGHT BEARING RESTRICTIONS: No  FALLS:  Has patient fallen in last 6 months? Yes. Number of falls 2-3  PLOF: Independent  PATIENT GOALS: decrease falls  NEXT MD VISIT:   OBJECTIVE:   DIAGNOSTIC FINDINGS:   PATIENT SURVEYS:   COGNITION: Overall cognitive status: Within functional limits for tasks assessed     SENSATION:  EDEMA:  None   POSTURE: No Significant postural limitations  PALPATION: Well healed incision at R toe from recent skin CA removal, mild redness, dry skin, and mild scar tissue.  Painful callous over 5th met head/laterally on R foot.  LOWER EXTREMITY ROM: Hip : WNL Knees: WNL Ankles: R: mod/significant  limitation for DF(prior injury and surgery); L : mild/mod limitation for DF    LOWER EXTREMITY MMT:  MMT Right eval Left eval  Hip flexion 3- 3-  Hip extension    Hip abduction 3- 3-  Hip adduction    Hip internal rotation    Hip external rotation    Knee flexion 4- 4-  Knee extension 4 4  Ankle dorsiflexion    Ankle plantarflexion    Ankle inversion    Ankle eversion     (Blank rows = not tested)  LOWER EXTREMITY SPECIAL TESTS:  Neg ant drawer bil  FUNCTIONAL TESTS:  Step ups: weakness in L>R  GAIT: Distance walked: 145ft  Assistive device utilized: None Level of assistance: Complete Independence Comments:   [facility] PT Assessment - 01/24/23 0001       Standardized Balance Assessment   Standardized Balance Assessment Berg Balance Test      Berg Balance Test   Sit to Stand Able to stand without using hands and stabilize independently    Standing Unsupported Able to stand safely 2 minutes    Sitting with Back Unsupported but Feet Supported on Floor or Stool Able to sit safely and securely 2 minutes    Stand to Sit Sits safely with minimal use of hands    Transfers Able to transfer safely, minor use of hands    Standing Unsupported with  Eyes Closed Able to stand 10 seconds safely    Standing Unsupported with Feet Together Able to place feet together independently and stand for 1 minute with supervision    From Standing, Reach Forward with Outstretched Arm Can reach confidently >25 cm (10")    From Standing Position, Pick up Object from Floor Able to pick up shoe safely and easily    From Standing Position, Turn to Look Behind Over each Shoulder Looks behind from both sides and weight shifts well    Turn 360 Degrees Able to turn 360 degrees safely in 4 seconds or less    Standing Unsupported, Alternately  Place Feet on Step/Stool Able to stand independently and complete 8 steps >20 seconds    Standing Unsupported, One Foot in Front Able to take small step independently and hold 30 seconds    Standing on One Leg Tries to lift leg/unable to hold 3 seconds but remains standing independently    Total Score 49              TODAY'S TREATMENT:                                                                                                                              DATE:    PATIENT EDUCATION:  Education details: PT POC, Exam findings,  Person educated: Patient Education method: Explanation, Demonstration, Tactile cues, Verbal cues, and Handouts Education comprehension: verbalized understanding, returned demonstration, verbal cues required, tactile cues required, and needs further education   HOME EXERCISE PROGRAM:   ASSESSMENT:  CLINICAL IMPRESSION: Patient presents with primary complaint of having new, recrrent falls,  2 in last month.She is unable to tell what is causing her to fall. She does have quite a bit of weakness in Hips with testing today, but no significant weakness in knee or laxity in knee that may be contributing to falls. She denies other dizziness, lightheadedness at time of falls. She is generally deconditioned from having heart surgery about 1 year ago, then having toe CA removal in recent months as  well, so she has been more sedentary. She will benefit from general conditioning to improve activity level and tolerance for functional activity. Unclear reason for several recent falls. Will continue dynamic balance testing next visit to further assess fall risk. Pt to benefit from skilled PT to improve deficits and reduce fall risk.    OBJECTIVE IMPAIRMENTS: Abnormal gait, decreased activity tolerance, decreased balance, decreased coordination, decreased endurance, decreased knowledge of use of DME, decreased mobility, difficulty walking, decreased strength, and increased muscle spasms.   ACTIVITY LIMITATIONS: carrying, bending, standing, squatting, stairs, and locomotion level  PARTICIPATION LIMITATIONS: cleaning, laundry, shopping, and community activity  PERSONAL FACTORS:  none  are also affecting patient's functional outcome.   REHAB POTENTIAL: Good  CLINICAL DECISION MAKING: Stable/uncomplicated  EVALUATION COMPLEXITY: Low   GOALS: Goals reviewed with patient? Yes  SHORT TERM GOALS: Target date: 02/07/2023   Pt to be independent with initial HEP  Goal status: INITIAL  2.  Pt to demo ability for at least 5 SLR bil;    Goal status: INITIAL   LONG TERM GOALS: Target date: 03/21/2023   Pt to be independent with final HEP  Goal status: INITIAL  2.  Pt to demo improved strength of bil LEs to at least 4/5 for hips and 4+/5 for knees, to improve ability for stairs and gait.   Goal status: INITIAL  3.  Dynamic balance goal TBD  Goal status: INITIAL  4.  Pt to report walking at least 3 x/wk for up to 20 min. :  Goal  status: INITIAL   PLAN:  PT FREQUENCY: 1-2x/week  PT DURATION: 8 weeks  PLANNED INTERVENTIONS: Therapeutic exercises, Therapeutic activity, Neuromuscular re-education, Balance training, Gait training, Patient/Family education, Self Care, and Joint mobilization  PLAN FOR NEXT SESSION:  more balance testing, dynamic,  Issue HEP for LE strength, LAQ,  SLR, Heel raises, march, hip abd.    Sedalia Muta, PT, DPT 8:38 PM  01/24/23

## 2023-01-24 NOTE — Progress Notes (Signed)
Marissa Lee 72 yr old female presents to office today for 3rd of 4 weekly B12 injections per Ila Mcgill, PA. Administered CYANOCOBALAMIN 1,000 mcg IM left arm. Patient tolerated well. Aware to return next week for final weekly injection.

## 2023-01-25 ENCOUNTER — Ambulatory Visit: Payer: Medicare Other

## 2023-02-01 ENCOUNTER — Encounter: Payer: Self-pay | Admitting: Physical Therapy

## 2023-02-01 ENCOUNTER — Ambulatory Visit: Payer: Medicare Other | Admitting: Physical Therapy

## 2023-02-01 ENCOUNTER — Ambulatory Visit (INDEPENDENT_AMBULATORY_CARE_PROVIDER_SITE_OTHER): Payer: Medicare Other

## 2023-02-01 DIAGNOSIS — E538 Deficiency of other specified B group vitamins: Secondary | ICD-10-CM | POA: Diagnosis not present

## 2023-02-01 DIAGNOSIS — M6281 Muscle weakness (generalized): Secondary | ICD-10-CM

## 2023-02-01 DIAGNOSIS — R2689 Other abnormalities of gait and mobility: Secondary | ICD-10-CM

## 2023-02-01 MED ORDER — CYANOCOBALAMIN 1000 MCG/ML IJ SOLN
1000.0000 ug | Freq: Once | INTRAMUSCULAR | Status: AC
Start: 2023-02-01 — End: 2023-02-01
  Administered 2023-02-01: 1000 ug via INTRAMUSCULAR

## 2023-02-01 NOTE — Progress Notes (Signed)
Marissa Lee 72 yr old female presents to office for 4th of 4 weekly B12 injections per Ila Mcgill, PA. Administered CYANOCOBALAMIN 1,000 mcg IM right arm. Patient tolerated well. Aware to return in 1 month for next injection.

## 2023-02-01 NOTE — Therapy (Addendum)
OUTPATIENT PHYSICAL THERAPY LOWER EXTREMITY TREATMENT   Patient Name: Marissa Lee MRN: [id number] DOB:[birth year], 72 y.o., female Today's Date: 02/01/2023  END OF SESSION:  PT End of Session - 02/01/23 1043     Visit Number 2    Number of Visits 16    Date for PT Re-Evaluation 03/21/23    Authorization Type [employer] Medicare    PT Start Time 1052    PT Stop Time 1134    PT Time Calculation (min) 42 min    Equipment Utilized During Treatment Gait belt    Activity Tolerance Patient tolerated treatment well             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/2022   Osteopenia 08/2016   hip and spine   Subclinical hypothyroidism 10/04/2018   Urge incontinence 06/05/2013   Past Surgical History:  Procedure Laterality Date   ABDOMINAL HYSTERECTOMY     BLADDER SUSPENSION  2004   done with [facility]   BREAST BIOPSY  1980   done 3x   CLOSED REDUCTION METACARPAL WITH PERCUTANEOUS PINNING Right 08/24/2016   Procedure: CLOSED REDUCTION  WITH PERCUTANEOUS PINNING;  Surgeon: Bradly Bienenstock, MD;  Location: [facility] OR;  Service: Orthopedics;  Laterality: Right;   COLONOSCOPY     COMBINED HYSTERECTOMY VAGINAL / OOPHORECTOMY / A&P REPAIR  2004   CORONARY ARTERY BYPASS GRAFT N/A 07/28/2021   Procedure: CORONARY ARTERY BYPASS GRAFTING (CABG) times two using left internal mammary artery and  left leg saphenous vein;  Surgeon: Loreli Slot, MD;  Location: [facility] OR;  Service: Open Heart Surgery;  Laterality: N/A;   FOOT SURGERY Right 1992   IABP INSERTION Right 07/28/2021   Procedure: IABP Insertion;  Surgeon: Marykay Lex, MD;  [facility]: [facility] INVASIVE CV LAB;  Service: Cardiovascular;  Laterality: Right;   LEFT HEART CATH AND CORONARY  ANGIOGRAPHY N/A 07/28/2021   Procedure: LEFT HEART CATH AND CORONARY ANGIOGRAPHY;  Surgeon: Marykay Lex, MD;  Location: [facility] INVASIVE CV LAB;  Service: Cardiovascular;  Laterality: N/A;   OPEN REDUCTION INTERNAL FIXATION (ORIF) HAND Right 08/24/2016   Procedure: RIGHT HAND LONG RING AND SMALL FINGER  POSSIBLE ORIF;  Surgeon: Bradly Bienenstock, MD;  Location: [facility] OR;  Service: Orthopedics;  Laterality: Right;   WISDOM TOOTH EXTRACTION  1978   Patient Active Problem List   Diagnosis Date Noted   Recurrent falls 01/10/2023   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   Unstable angina (HCC) 07/27/2021   Hiatal hernia 10/30/2019   Subclinical hypothyroidism 10/04/2018   Low back pain radiating to left lower extremity 04/26/2018   Anxiety 09/29/2017   Osteopenia 09/28/2016   Hyperlipidemia 09/28/2016   Hypersomnia, persistent 07/20/2015    PCP: Arlyss Repress Allwardt  REFERRING PROVIDER: Arlyss Repress Allwardt  REFERRING DIAG: Recurrent falls   THERAPY DIAG:  Other abnormalities of gait and mobility  Muscle weakness (generalized)  Rationale for Evaluation and Treatment: Rehabilitation  ONSET DATE: 1- 2months ago  SUBJECTIVE:   SUBJECTIVE STATEMENT: She was very tired yesterday, spent whole day in bed, because she was exhausted from watching grandchildren for a few days in a row.   Eval: Pt with at least 2-3  recent falls. She feels like leg is giving out on her but is unable to pinpoint what is happening. Always lands on L side/hand , so she thinks its her L leg.  No previous L knee issues, does report some cramping in L LE recently  more when walking on uneven ground at beach She Has had back issues in the past with previous injection. About 10 yrs ago, but reports no back pain now or since then.  Denies any dizziness, lightheadedness, etc.  She had heart surgery about 1 year ago, then had cancer removal on R toe in Jan/feb. She wore boot for a while on foot.  Reports being more sedentary lately due to these  surgeries. She has not been able to return to her regular activity level since then.  She Did have recent stress test , states good results.    PERTINENT HISTORY:  PAIN:  Are you having pain? No  PRECAUTIONS: None  WEIGHT BEARING RESTRICTIONS: No  FALLS:  Has patient fallen in last 6 months? Yes. Number of falls 2-3  PLOF: Independent  PATIENT GOALS: decrease falls  NEXT MD VISIT:   OBJECTIVE:   DIAGNOSTIC FINDINGS:   PATIENT SURVEYS:   COGNITION: Overall cognitive status: Within functional limits for tasks assessed     SENSATION:  EDEMA:  None   POSTURE: No Significant postural limitations  PALPATION: Well healed incision at R toe from recent skin CA removal, mild redness, dry skin, and mild scar tissue.  Painful callous over 5th met head/laterally on R foot.  LOWER EXTREMITY ROM: Hip : WNL Knees: WNL Ankles: R: mod/significant  limitation for DF(prior injury and surgery); L : mild/mod limitation for DF    LOWER EXTREMITY MMT:  MMT Right eval Left eval  Hip flexion 3- 3-  Hip extension    Hip abduction 3- 3-  Hip adduction    Hip internal rotation    Hip external rotation    Knee flexion 4- 4-  Knee extension 4 4  Ankle dorsiflexion    Ankle plantarflexion    Ankle inversion    Ankle eversion     (Blank rows = not tested)  LOWER EXTREMITY SPECIAL TESTS:  Neg ant drawer bil  FUNCTIONAL TESTS:  Step ups: weakness in L>R  GAIT: Distance walked: 114ft  Assistive device utilized: None Level of assistance: Complete Independence Comments:   [facility] PT Assessment - 02/01/23 0001       Standardized Balance Assessment   Standardized Balance Assessment Dynamic Gait Index      Dynamic Gait Index   Level Surface Normal    Change in Gait Speed Normal    Gait with Horizontal Head Turns Mild Impairment    Gait with Vertical Head Turns Normal    Gait and Pivot Turn Mild Impairment    Step Over Obstacle Mild Impairment    Step Around Obstacles  Normal    Steps Mild Impairment    Total Score 20            .  TODAY'S TREATMENT:  DATE:   02/01/2023 Therapeutic Exercise: Aerobic: Supine:  Bridging 1 x 10; 1 x 5;  Seated: LAQ 2 x 10 bil;  sit to stand x 10 Standing:  HR and marching x 20;  Tandem stance 30 sec bil; Rhomberg stance with head turns.  Stretches:   Neuromuscular Re-education: Manual Therapy: Physical performance measure: DGI, see score above    PATIENT EDUCATION:  Education details: updated and reviewed HEP Person educated: Patient Education method: Explanation, Demonstration, Tactile cues, Verbal cues, and Handouts Education comprehension: verbalized understanding, returned demonstration, verbal cues required, tactile cues required, and needs further education   HOME EXERCISE PROGRAM:  Access Code: [email or web address] URL: [email or web address] Date: 02/01/2023 Prepared by: Sedalia Muta  Exercises - Supine Bridge  - 1 x daily - 2 sets - 10 reps - Seated Knee Extension AROM  - 1 x daily - 2 sets - 10 reps - Heel Raises with Counter Support  - 1 x daily - 2 sets - 10 reps - Standing March with Counter Support  - 1 x daily - 2 sets - 10 reps - Sit to Stand  - 1 x daily - 2 sets - 10 reps   ASSESSMENT:  CLINICAL IMPRESSION: Education for initial HEP for LE strength today. Pt challenged with activities due to weakness. Mild dynamic balance deficits with testing, more with direction changes and higher speeds. Will continue to progress LE strength, endurance, and balance as tolerated.   Eval: Patient presents with primary complaint of having new, recrrent falls,  2 in last month.She is unable to tell what is causing her to fall. She does have quite a bit of weakness in Hips with testing today, but no significant weakness in knee or laxity in knee that may be contributing to  falls. She denies other dizziness, lightheadedness at time of falls. She is generally deconditioned from having heart surgery about 1 year ago, then having toe CA removal in recent months as well, so she has been more sedentary. She will benefit from general conditioning to improve activity level and tolerance for functional activity. Unclear reason for several recent falls. Will continue dynamic balance testing next visit to further assess fall risk. Pt to benefit from skilled PT to improve deficits and reduce fall risk.    OBJECTIVE IMPAIRMENTS: Abnormal gait, decreased activity tolerance, decreased balance, decreased coordination, decreased endurance, decreased knowledge of use of DME, decreased mobility, difficulty walking, decreased strength, and increased muscle spasms.   ACTIVITY LIMITATIONS: carrying, bending, standing, squatting, stairs, and locomotion level  PARTICIPATION LIMITATIONS: cleaning, laundry, shopping, and community activity  PERSONAL FACTORS:  none  are also affecting patient's functional outcome.   REHAB POTENTIAL: Good  CLINICAL DECISION MAKING: Stable/uncomplicated  EVALUATION COMPLEXITY: Low   GOALS: Goals reviewed with patient? Yes  SHORT TERM GOALS: Target date: 02/07/2023   Pt to be independent with initial HEP  Goal status: INITIAL  2.  Pt to demo ability for at least 5 SLR bil;    Goal status: INITIAL   LONG TERM GOALS: Target date: 03/21/2023   Pt to be independent with final HEP  Goal status: INITIAL  2.  Pt to demo improved strength of bil LEs to at least 4/5 for hips and 4+/5 for knees, to improve ability for stairs and gait.   Goal status: INITIAL  3.  Dynamic balance goal : Pt to demo improved score on DGI by at least 3 points.   Goal status: INITIAL  4.  Pt to report walking at least  3 x/wk for up to 20 min. :  Goal status: INITIAL   PLAN:  PT FREQUENCY: 1-2x/week  PT DURATION: 8 weeks  PLANNED INTERVENTIONS: Therapeutic  exercises, Therapeutic activity, Neuromuscular re-education, Balance training, Gait training, Patient/Family education, Self Care, and Joint mobilization  PLAN FOR NEXT SESSION:  more balance testing, dynamic, hip abd, LE strength.  DF stretch  Sedalia Muta, PT, DPT 11:37 AM  02/01/23

## 2023-02-06 ENCOUNTER — Encounter: Payer: Self-pay | Admitting: Family Medicine

## 2023-02-06 ENCOUNTER — Encounter: Payer: Medicare Other | Admitting: Physical Therapy

## 2023-02-06 ENCOUNTER — Ambulatory Visit: Payer: Medicare Other | Admitting: Family Medicine

## 2023-02-06 VITALS — BP 112/62 | HR 69 | Temp 98.3°F | Resp 16 | Ht 64.5 in | Wt 143.6 lb

## 2023-02-06 DIAGNOSIS — R319 Hematuria, unspecified: Secondary | ICD-10-CM | POA: Diagnosis not present

## 2023-02-06 DIAGNOSIS — R300 Dysuria: Secondary | ICD-10-CM | POA: Diagnosis not present

## 2023-02-06 LAB — URINALYSIS, ROUTINE W REFLEX MICROSCOPIC
Bilirubin Urine: NEGATIVE
Ketones, ur: NEGATIVE
Nitrite: NEGATIVE
Specific Gravity, Urine: <=1.005 — AB (ref 1.000–1.030)
Urine Glucose: NEGATIVE
Urobilinogen, UA: 0.2 (ref 0.0–1.0)
pH: 6.5 (ref 5.0–8.0)

## 2023-02-06 LAB — POCT URINALYSIS DIPSTICK
Bilirubin, UA: NEGATIVE
Blood, UA: POSITIVE
Glucose, UA: NEGATIVE
Ketones, UA: NEGATIVE
Nitrite, UA: NEGATIVE
Protein, UA: POSITIVE — AB
Spec Grav, UA: <=1.005 — AB (ref 1.010–1.025)
Urobilinogen, UA: 0.2 E.U./dL
pH, UA: 6 (ref 5.0–8.0)

## 2023-02-06 MED ORDER — NITROFURANTOIN MONOHYD MACRO 100 MG PO CAPS: 100.0000 mg | ORAL_CAPSULE | Freq: Two times a day (BID) | ORAL | 0 refills | Status: DC

## 2023-02-06 NOTE — Patient Instructions (Signed)
It was very nice to see you today!  You probably have a UTI.  Please start the Macrobid.  We will check a urine sample to confirm this and also make sure that you do not have a kidney stone.  Return if symptoms worsen or fail to improve.   Take care, Dr Jimmey Ralph  PLEASE NOTE:  If you had any lab tests, please let us know if you have not heard back within a few days. You may see your results on mychart before we have a chance to review them but we will give you a call once they are reviewed by [place].   If we ordered any referrals today, please let us know if you have not heard from their office within the next week.   If you had any urgent prescriptions sent in today, please check with the pharmacy within an hour of our visit to make sure the prescription was transmitted appropriately.   Please try these tips to maintain a healthy lifestyle:  Eat at least 3 REAL meals and 1-2 snacks per day.  Aim for no more than 5 hours between eating.  If you eat breakfast, please do so within one hour of getting up.   Each meal should contain half fruits/vegetables, one quarter protein, and one quarter carbs (no bigger than a computer mouse)  Cut down on sweet beverages. This includes juice, soda, and sweet tea.   Drink at least 1 glass of water with each meal and aim for at least 8 glasses per day  Exercise at least 150 minutes every week.

## 2023-02-06 NOTE — Progress Notes (Signed)
   Marissa Lee is a 72 y.o. female who presents today for an office visit.  Assessment/Plan:  Hematuria Likely due to urinary tract infection. Her gross hematuria has resolved but still has frequency and urgency as well as low back pain.  Urine dip with positive hemoglobin, leukocytes, and protein. Will check urine culture and send out urinalysis.  Will empirically start Macrobid while we await culture results.  If culture is negative and symptoms persist will need to rule out nephrolithiasis.  Encouraged hydration.  We discussed reasons to return to care.    Subjective:  HPI:  She is concerned about urinary tract infection.  Symptoms started yesterday morning. She has noticed pink urine. Some right sided back pain. No dysuria. Some frequency. No nausea or vomiting. Some mild hot flashes yesterday. No fevers or chills. Urine seems clear today.        Objective:  Physical Exam: BP 112/62   Pulse 69   Temp 98.3 F (36.8 C) (Temporal)   Resp 16   Ht 5' 4.5" (1.638 m)   Wt 143 lb 9.6 oz (65.1 kg)   LMP 09/27/2003   SpO2 95%   BMI 24.27 kg/m   Gen: No acute distress, resting comfortably CV: Regular rate and rhythm with no murmurs appreciated Pulm: Normal work of breathing, clear to auscultation bilaterally with no crackles, wheezes, or rhonchi Neuro: Grossly normal, moves all extremities Psych: Normal affect and thought content      Jenson Beedle M. Jimmey Ralph, MD 02/06/2023 12:58 PM

## 2023-02-07 ENCOUNTER — Telehealth: Payer: Self-pay | Admitting: Physician Assistant

## 2023-02-07 LAB — UNLABELED: Test Ordered On Req: 395

## 2023-02-07 NOTE — Telephone Encounter (Signed)
Patient requests to be called to discuss  Lab results Patient received on MyChart and diagnosis

## 2023-02-08 ENCOUNTER — Encounter: Payer: Medicare Other | Admitting: Physical Therapy

## 2023-02-08 NOTE — Telephone Encounter (Signed)
Pt scheduled for visit w/ PCP tomorrow

## 2023-02-08 NOTE — Progress Notes (Signed)
Normal circulation of the legs

## 2023-02-08 NOTE — Telephone Encounter (Signed)
Please see pt msg and advise. Only showing Urine completed by Jimmey Ralph on 02/06/23 but no results showing as of yet

## 2023-02-08 NOTE — Telephone Encounter (Signed)
Patient is in quite a bit of pain and wonders if this is a kidney stone. Pt can see urine results and wants to discuss them. Please advise. Patient prefers a phone call today if possible

## 2023-02-09 ENCOUNTER — Ambulatory Visit: Payer: Medicare Other | Admitting: Physician Assistant

## 2023-02-09 ENCOUNTER — Encounter: Payer: Self-pay | Admitting: Physician Assistant

## 2023-02-09 VITALS — BP 108/64 | HR 62 | Temp 97.1°F | Ht 64.5 in | Wt 145.6 lb

## 2023-02-09 DIAGNOSIS — R300 Dysuria: Secondary | ICD-10-CM

## 2023-02-09 MED ORDER — PHENAZOPYRIDINE HCL 95 MG PO TABS
95.0000 mg | ORAL_TABLET | Freq: Three times a day (TID) | ORAL | 0 refills | Status: AC | PRN
Start: 2023-02-09 — End: 2023-02-11

## 2023-02-09 MED ORDER — SULFAMETHOXAZOLE-TRIMETHOPRIM 800-160 MG PO TABS
1.0000 | ORAL_TABLET | Freq: Two times a day (BID) | ORAL | 0 refills | Status: AC
Start: 2023-02-09 — End: 2023-02-14

## 2023-02-09 NOTE — Progress Notes (Signed)
Called patient, Marissa Lee, LMAM

## 2023-02-09 NOTE — Progress Notes (Signed)
Her urine has a lot of blood, bacteria, and white blood cells.  She likely has UTI.  She should let us know if her symptoms are not improving.  Can we check on the status of her urine culture?  It should be back by now.

## 2023-02-09 NOTE — Progress Notes (Signed)
Subjective:    Patient ID: Marissa Lee, female    DOB: 1951-08-20, 72 y.o.   MRN: [id number]  Chief Complaint  Patient presents with   Urinary Tract Infection    Pt seen Dr Jimmey Ralph Monday, urine culture not came back and pt still in pain in lower back more on right side, pt also c/o some confusion but don't know if this is medication related.     HPI Patient is in today for UTI symptoms.  She has been taking macrobid, 6 tablets so far. She has been staying in bed; feels somewhat confused, misplaced where she parked her car the other day. Also felt confused with a phone call this morning as well. Last night had weird dreams, some spinning sensation.   No longer seeing blood in urine. Still having some dysuria, but somewhat better. Pain has been R flank, but now more toward mid-back. Urinating frequently, but no stream issues. No nausea / vomiting. Some chills / flushing.  Pt has never had kidney stones.  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/2022   Osteopenia 08/2016   hip and spine   Subclinical hypothyroidism 10/04/2018   Urge incontinence 06/05/2013    Past Surgical History:  Procedure Laterality Date   ABDOMINAL HYSTERECTOMY     BLADDER SUSPENSION  2004   done with [facility]   BREAST BIOPSY  1980   done 3x   CLOSED REDUCTION METACARPAL WITH PERCUTANEOUS PINNING Right 08/24/2016   Procedure: CLOSED REDUCTION  WITH PERCUTANEOUS PINNING;  Surgeon: Bradly Bienenstock, MD;  Location: [facility] OR;  Service: Orthopedics;  Laterality: Right;   COLONOSCOPY     COMBINED HYSTERECTOMY VAGINAL / OOPHORECTOMY / A&P REPAIR  2004   CORONARY ARTERY BYPASS GRAFT N/A 07/28/2021   Procedure: CORONARY ARTERY BYPASS GRAFTING (CABG) times two  using left internal mammary artery and  left leg saphenous vein;  Surgeon: Loreli Slot, MD;  Location: [facility] OR;  Service: Open Heart Surgery;  Laterality: N/A;   FOOT SURGERY Right 1992   IABP INSERTION Right 07/28/2021   Procedure: IABP Insertion;  Surgeon: Marykay Lex, MD;  Location: [facility] INVASIVE CV LAB;  Service: Cardiovascular;  Laterality: Right;   LEFT HEART CATH AND CORONARY ANGIOGRAPHY N/A 07/28/2021   Procedure: LEFT HEART CATH AND CORONARY ANGIOGRAPHY;  Surgeon: Marykay Lex, MD;  Location: [facility] INVASIVE CV LAB;  Service: Cardiovascular;  Laterality: N/A;   OPEN REDUCTION INTERNAL FIXATION (ORIF) HAND Right 08/24/2016   Procedure: RIGHT HAND LONG RING AND SMALL FINGER  POSSIBLE ORIF;  Surgeon: Bradly Bienenstock, MD;  Location: [facility] OR;  Service: Orthopedics;  Laterality: Right;   WISDOM TOOTH EXTRACTION  1978    Family History  Problem Relation Age of Onset   Cancer Paternal Grandmother        Colon    Cancer Paternal Grandfather        Lung   Heart disease Mother        valvular disease, valve replacement   Atrial fibrillation Mother    Stroke Mother    Heart disease Brother        valve disease, valve surgery   Thyroid disease Maternal  Grandmother        Goiter   Thyroid disease Daughter        Hypothyroid    Social History   Tobacco Use   Smoking status: Former    Packs/day: 1.00    Years: 20.00    Additional pack years: 0.00    Total pack years: 20.00    Types: Cigarettes    Quit date: 2000    Years since quitting: 24.3   Smokeless tobacco: Never   Tobacco comments:    quit in approximately 2000  Vaping Use   Vaping Use: Never used  Substance Use Topics   Alcohol use: Yes    Alcohol/week: 3.0 - 4.0 standard drinks of alcohol    Types: 2 Cans of beer, 1 - 2 Standard drinks or equivalent per week    Comment: rarely   Drug use: No     Allergies  Allergen Reactions   Cephalosporins Anaphylaxis and Swelling    Throat swells    Clindamycin/Lincomycin     Metallic taste, blisters behind ears   Codeine Itching   Lisinopril Cough    Review of Systems NEGATIVE UNLESS OTHERWISE INDICATED IN HPI      Objective:     BP 108/64   Pulse 62   Temp (!) 97.1 F (36.2 C) (Temporal)   Ht 5' 4.5" (1.638 m)   Wt 145 lb 9.6 oz (66 kg)   LMP 09/27/2003   SpO2 97%   BMI 24.61 kg/m   Wt Readings from Last 3 Encounters:  02/09/23 145 lb 9.6 oz (66 kg)  02/06/23 143 lb 9.6 oz (65.1 kg)  01/10/23 145 lb (65.8 kg)    BP Readings from Last 3 Encounters:  02/09/23 108/64  02/06/23 112/62  01/10/23 (!) 149/73     Physical Exam Vitals and nursing note reviewed.  Constitutional:      General: She is not in acute distress.    Appearance: Normal appearance. She is not ill-appearing.  HENT:     Head: Normocephalic and atraumatic.  Cardiovascular:     Rate and Rhythm: Normal rate and regular rhythm.     Pulses: Normal pulses.     Heart sounds: Normal heart sounds.  Pulmonary:     Effort: Pulmonary effort is normal.     Breath sounds: Normal breath sounds.  Abdominal:     General: Abdomen is flat. Bowel sounds are normal.     Palpations: Abdomen is soft. There is no mass.     Tenderness: There is no abdominal tenderness. There is no right CVA tenderness or left CVA tenderness.  Skin:    General: Skin is warm and dry.  Neurological:     General: No focal deficit present.     Mental Status: She is alert.  Psychiatric:        Mood and Affect: Mood normal.        Assessment & Plan:  Dysuria -     Sulfamethoxazole-Trimethoprim; Take 1 tablet by mouth 2 (two) times daily for 5 days.  Dispense: 10 tablet; Refill: 0 -     Phenazopyridine HCl; Take 1 tablet (95 mg total) by mouth 3 (three) times daily as needed for up to 2 days for pain. Take for urinary pain.  Dispense: 10 tablet; Refill: 0   Symptoms are improving since visit two days ago with macrobid, but it may be causing confusion / dizziness. Will stop  macrobid, start Bactrim instead, cautioned about watching for rash. Try AZO to  help with urinary symptoms. Fluids. Pain is better today. Urine culture still pending. If red flags, knows to go to ED.     Return if symptoms worsen or fail to improve.    Millan Legan M Reah Justo, PA-C

## 2023-02-10 ENCOUNTER — Other Ambulatory Visit: Payer: Medicare Other

## 2023-02-13 ENCOUNTER — Encounter: Payer: Self-pay | Admitting: Physical Therapy

## 2023-02-13 ENCOUNTER — Ambulatory Visit: Payer: Medicare Other | Admitting: Physical Therapy

## 2023-02-13 DIAGNOSIS — M6281 Muscle weakness (generalized): Secondary | ICD-10-CM | POA: Diagnosis not present

## 2023-02-13 DIAGNOSIS — R2689 Other abnormalities of gait and mobility: Secondary | ICD-10-CM

## 2023-02-13 NOTE — Therapy (Signed)
OUTPATIENT PHYSICAL THERAPY LOWER EXTREMITY TREATMENT   Patient Name: Marissa Lee MRN: [id number] DOB:1951/02/24, 72 y.o., female Today's Date: 02/13/2023  END OF SESSION:  PT End of Session - 02/13/23 1103     Visit Number 3    Number of Visits 16    Date for PT Re-Evaluation 03/21/23    Authorization Type [employer] Medicare    PT Start Time 1104    PT Stop Time 1144    PT Time Calculation (min) 40 min    Equipment Utilized During Treatment Gait belt    Activity Tolerance Patient tolerated treatment well             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/2022   Osteopenia 08/2016   hip and spine   Subclinical hypothyroidism 10/04/2018   Urge incontinence 06/05/2013   Past Surgical History:  Procedure Laterality Date   ABDOMINAL HYSTERECTOMY     BLADDER SUSPENSION  2004   done with [facility]   BREAST BIOPSY  1980   done 3x   CLOSED REDUCTION METACARPAL WITH PERCUTANEOUS PINNING Right 08/24/2016   Procedure: CLOSED REDUCTION  WITH PERCUTANEOUS PINNING;  Surgeon: Bradly Bienenstock, MD;  Location: [facility] OR;  Service: Orthopedics;  Laterality: Right;   COLONOSCOPY     COMBINED HYSTERECTOMY VAGINAL / OOPHORECTOMY / A&P REPAIR  2004   CORONARY ARTERY BYPASS GRAFT N/A 07/28/2021   Procedure: CORONARY ARTERY BYPASS GRAFTING (CABG) times two using left internal mammary artery and  left leg saphenous vein;  Surgeon: Loreli Slot, MD;  Location: [facility] OR;  Service: Open Heart Surgery;  Laterality: N/A;   FOOT SURGERY Right 1992   IABP INSERTION Right 07/28/2021   Procedure: IABP Insertion;  Surgeon: Marykay Lex, MD;  Location[facility] INVASIVE CV LAB;  Service: Cardiovascular;  Laterality: Right;   LEFT HEART CATH AND  CORONARY ANGIOGRAPHY N/A 07/28/2021   Procedure: LEFT HEART CATH AND CORONARY ANGIOGRAPHY;  Surgeon: Marykay Lex, MD;  Location: [facility] INVASIVE CV LAB;  Service: Cardiovascular;  Laterality: N/A;   OPEN REDUCTION INTERNAL FIXATION (ORIF) HAND Right 08/24/2016   Procedure: RIGHT HAND LONG RING AND SMALL FINGER  POSSIBLE ORIF;  Surgeon: Bradly Bienenstock, MD;  Location: [facility] OR;  Service: Orthopedics;  Laterality: Right;   WISDOM TOOTH EXTRACTION  1978   Patient Active Problem List   Diagnosis Date Noted   Recurrent falls 01/10/2023   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   Unstable angina (HCC) 07/27/2021   Hiatal hernia 10/30/2019   Subclinical hypothyroidism 10/04/2018   Low back pain radiating to left lower extremity 04/26/2018   Anxiety 09/29/2017   Osteopenia 09/28/2016   Hyperlipidemia 09/28/2016   Hypersomnia, 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 She is feeling tired, she was helping her sister this weekend. States some cramping in legs when sleeping as  well as legs feeling tired.   Eval: Pt with at least 2-3 recent falls. She feels like leg is giving out on her but is unable to pinpoint what is happening. Always lands on L side/hand , so she thinks its her L leg.  No previous L knee issues, does report some cramping in L LE recently  more when walking on uneven ground at beach She Has had back issues in the past with previous injection. About 10 yrs ago, but reports no back pain now or since then.  Denies any dizziness, lightheadedness, etc.  She had heart surgery about 1 year ago, then had cancer removal on R toe in Jan/feb. She wore boot  for a while on foot.  Reports being more sedentary lately due to these surgeries. She has not been able to return to her regular activity level since then.  She Did have recent stress test , states good results.    PERTINENT HISTORY:  PAIN:  Are you having pain? No  PRECAUTIONS: None  WEIGHT BEARING RESTRICTIONS: No  FALLS:  Has patient fallen in last 6 months? Yes. Number of falls 2-3  PLOF: Independent  PATIENT GOALS: decrease falls  NEXT MD VISIT:   OBJECTIVE:   DIAGNOSTIC FINDINGS:   PATIENT SURVEYS:   COGNITION: Overall cognitive status: Within functional limits for tasks assessed     SENSATION:  EDEMA:  None   POSTURE: No Significant postural limitations  PALPATION: Well healed incision at R toe from recent skin CA removal, mild redness, dry skin, and mild scar tissue.  Painful callous over 5th met head/laterally on R foot.  LOWER EXTREMITY ROM: Hip : WNL Knees: WNL Ankles: R: mod/significant  limitation for DF(prior injury and surgery); L : mild/mod limitation for DF    LOWER EXTREMITY MMT:  MMT Right eval Left eval  Hip flexion 3- 3-  Hip extension    Hip abduction 3- 3-  Hip adduction    Hip internal rotation    Hip external rotation    Knee flexion 4- 4-  Knee extension 4 4  Ankle dorsiflexion    Ankle plantarflexion    Ankle inversion    Ankle eversion     (Blank rows = not tested)  LOWER EXTREMITY SPECIAL TESTS:  Neg ant drawer bil  FUNCTIONAL TESTS:  Step ups: weakness in L>R  GAIT: Distance walked: 133ft  Assistive device utilized: None Level of assistance: Complete Independence Comments:    .  TODAY'S TREATMENT:                                                                                                                              DATE:   02/13/2023 Therapeutic Exercise: Aerobic: Supine:  SLR 2 x 5 bil;  Bridging 3 x 5;  Supine hip and quad stretch 10 sec x 5 on L;  Seated: LAQ 2 x 10 bil (no weight ) ;    Standing:  Stretches:  Standing DF stretch 30 sec x 2 bil;  Neuromuscular Re-education: Manual Therapy: Physical performance measure:    PATIENT EDUCATION:  Education details: updated and reviewed HEP Person educated: Patient Education method: Explanation, Demonstration, Tactile cues, Verbal cues, and Handouts Education comprehension: verbalized understanding, returned demonstration, verbal cues required, tactile cues required, and needs further education   HOME EXERCISE PROGRAM:  Access Code: [email or web address] URL: [email or web address] Date: 02/01/2023 Prepared by: Sedalia Muta  Exercises - Supine Bridge  - 1 x daily - 2 sets - 10 reps - Seated Knee Extension AROM  - 1 x daily - 2 sets - 10 reps - Heel Raises with Counter Support  - 1 x daily - 2 sets - 10 reps - Standing March with Counter Support  - 1 x daily - 2 sets - 10 reps - Sit to Stand  - 1 x daily - 2 sets - 10 reps   ASSESSMENT:  CLINICAL IMPRESSION: Pt very challenged with strength/ther ex today, due to weakness. She has low tolerance for exercise and tires easily. She will benefit from continued/progressive activity and strength as tolerated. Updated and reviewed HEP.   Eval: Patient presents with primary complaint of having new, recrrent falls,  2 in last month.She is unable to tell what is causing her to fall. She does have quite a bit of weakness in Hips with testing today, but no significant weakness in knee or laxity in knee that may be contributing to falls. She denies other dizziness, lightheadedness at time of falls. She is generally deconditioned from having heart surgery about 1 year ago, then having toe CA removal in recent months as well, so she has been more sedentary. She will benefit from general conditioning to improve activity level and tolerance for functional activity. Unclear reason for several recent falls. Will continue dynamic balance testing next visit to further assess fall risk. Pt  to benefit from skilled PT to improve deficits and reduce fall risk.    OBJECTIVE IMPAIRMENTS: Abnormal gait, decreased activity tolerance, decreased balance, decreased coordination, decreased endurance, decreased knowledge of use of DME, decreased mobility, difficulty walking, decreased strength, and increased muscle spasms.   ACTIVITY LIMITATIONS: carrying, bending, standing, squatting, stairs, and locomotion level  PARTICIPATION LIMITATIONS: cleaning, laundry, shopping, and community activity  PERSONAL FACTORS:  none  are also affecting patient's functional outcome.   REHAB POTENTIAL: Good  CLINICAL DECISION MAKING: Stable/uncomplicated  EVALUATION COMPLEXITY: Low   GOALS: Goals reviewed with patient? Yes  SHORT TERM GOALS: Target date: 02/07/2023   Pt to be independent with initial HEP  Goal status: INITIAL  2.  Pt to demo ability for at least 5 SLR bil;    Goal status: INITIAL   LONG TERM GOALS: Target date: 03/21/2023   Pt to be independent with final HEP  Goal status: INITIAL  2.  Pt to demo improved strength of bil LEs to at least 4/5 for hips and 4+/5 for knees, to improve ability for stairs and gait.   Goal status: INITIAL  3.  Dynamic balance goal : Pt to demo improved score on DGI by at least 3 points.   Goal status: INITIAL  4.  Pt to report walking at least 3 x/wk for up to 20 min. :  Goal status: INITIAL   PLAN:  PT FREQUENCY: 1-2x/week  PT DURATION: 8 weeks  PLANNED INTERVENTIONS: Therapeutic exercises, Therapeutic activity, Neuromuscular re-education, Balance training, Gait training, Patient/Family education, Self Care, and Joint mobilization  PLAN FOR NEXT SESSION:  more balance  testing, dynamic, hip abd, LE strength.  DF stretch  Sedalia Muta, PT, DPT 12:12 PM  02/13/23

## 2023-02-14 NOTE — Progress Notes (Signed)
Called patient, NA, left results on VM.

## 2023-02-15 ENCOUNTER — Encounter: Payer: Medicare Other | Admitting: Physical Therapy

## 2023-02-15 LAB — TEST AUTHORIZATION

## 2023-02-15 LAB — PAT ID TIQ DOC

## 2023-02-16 ENCOUNTER — Encounter: Payer: Medicare Other | Admitting: Physical Therapy

## 2023-02-16 LAB — URINE CULTURE: MICRO NUMBER:: 14990143

## 2023-02-16 LAB — PAT ID TIQ DOC

## 2023-02-16 LAB — TEST AUTHORIZATION

## 2023-02-18 LAB — PAT ID TIQ DOC: Test Affected: 395

## 2023-02-18 LAB — URINE CULTURE: SPECIMEN QUALITY:: ADEQUATE

## 2023-02-18 LAB — TEST AUTHORIZATION

## 2023-02-21 ENCOUNTER — Ambulatory Visit (INDEPENDENT_AMBULATORY_CARE_PROVIDER_SITE_OTHER): Payer: Medicare Other

## 2023-02-21 ENCOUNTER — Ambulatory Visit: Payer: Medicare Other | Admitting: Physical Therapy

## 2023-02-21 ENCOUNTER — Other Ambulatory Visit: Payer: Self-pay | Admitting: Cardiology

## 2023-02-21 ENCOUNTER — Other Ambulatory Visit: Payer: Self-pay | Admitting: Physician Assistant

## 2023-02-21 ENCOUNTER — Encounter: Payer: Self-pay | Admitting: Physical Therapy

## 2023-02-21 DIAGNOSIS — E538 Deficiency of other specified B group vitamins: Secondary | ICD-10-CM

## 2023-02-21 DIAGNOSIS — I25118 Atherosclerotic heart disease of native coronary artery with other forms of angina pectoris: Secondary | ICD-10-CM

## 2023-02-21 DIAGNOSIS — R300 Dysuria: Secondary | ICD-10-CM

## 2023-02-21 DIAGNOSIS — R2689 Other abnormalities of gait and mobility: Secondary | ICD-10-CM | POA: Diagnosis not present

## 2023-02-21 DIAGNOSIS — M6281 Muscle weakness (generalized): Secondary | ICD-10-CM | POA: Diagnosis not present

## 2023-02-21 LAB — POCT URINALYSIS DIPSTICK
Bilirubin, UA: NEGATIVE
Blood, UA: NEGATIVE
Glucose, UA: NEGATIVE
Nitrite, UA: NEGATIVE
Protein, UA: NEGATIVE
Spec Grav, UA: >=1.03 — AB (ref 1.010–1.025)
Urobilinogen, UA: 0.2 E.U./dL
pH, UA: 6 (ref 5.0–8.0)

## 2023-02-21 MED ORDER — CYANOCOBALAMIN 1000 MCG/ML IJ SOLN
1000.0000 ug | Freq: Once | INTRAMUSCULAR | Status: AC
Start: 2023-02-21 — End: 2023-02-21
  Administered 2023-02-21: 1000 ug via INTRAMUSCULAR

## 2023-02-21 NOTE — Therapy (Addendum)
OUTPATIENT PHYSICAL THERAPY LOWER EXTREMITY TREATMENT   Patient Name: Addlyn Eid MRN: [id number] DOB:Oct 18, 1950, 72 y.o., female Today's Date: 02/21/2023  END OF SESSION:  PT End of Session - 02/21/23 1118     Visit Number 4    Number of Visits 16    Date for PT Re-Evaluation 03/21/23    Authorization Type [employer] Medicare    PT Start Time 1108    PT Stop Time 1146    PT Time Calculation (min) 38 min    Equipment Utilized During Treatment Gait belt    Activity Tolerance Patient tolerated treatment well              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/2022   Osteopenia 08/2016   hip and spine   Subclinical hypothyroidism 10/04/2018   Urge incontinence 06/05/2013   Past Surgical History:  Procedure Laterality Date   ABDOMINAL HYSTERECTOMY     BLADDER SUSPENSION  2004   done with [facility]   BREAST BIOPSY  1980   done 3x   CLOSED REDUCTION METACARPAL WITH PERCUTANEOUS PINNING Right 08/24/2016   Procedure: CLOSED REDUCTION  WITH PERCUTANEOUS PINNING;  Surgeon: Bradly Bienenstock, MD;  Location: [facility] OR;  Service: Orthopedics;  Laterality: Right;   COLONOSCOPY     COMBINED HYSTERECTOMY VAGINAL / OOPHORECTOMY / A&P REPAIR  2004   CORONARY ARTERY BYPASS GRAFT N/A 07/28/2021   Procedure: CORONARY ARTERY BYPASS GRAFTING (CABG) times two using left internal mammary artery and  left leg saphenous vein;  Surgeon: Loreli Slot, MD;  Location: [facility] OR;  Service: Open Heart Surgery;  Laterality: N/A;   FOOT SURGERY Right 1992   IABP INSERTION Right 07/28/2021   Procedure: IABP Insertion;  Surgeon: Marykay Lex, MD;  Location: [facility] INVASIVE CV LAB;  Service: Cardiovascular;  Laterality: Right;   LEFT HEART CATH AND  CORONARY ANGIOGRAPHY N/A 07/28/2021   Procedure: LEFT HEART CATH AND CORONARY ANGIOGRAPHY;  Surgeon: Marykay Lex, MD;  Location: [facility] INVASIVE CV LAB;  Service: Cardiovascular;  Laterality: N/A;   OPEN REDUCTION INTERNAL FIXATION (ORIF) HAND Right 08/24/2016   Procedure: RIGHT HAND LONG RING AND SMALL FINGER  POSSIBLE ORIF;  Surgeon: Bradly Bienenstock, MD;  Location: [facility] OR;  Service: Orthopedics;  Laterality: Right;   WISDOM TOOTH EXTRACTION  1978   Patient Active Problem List   Diagnosis Date Noted   Recurrent falls 01/10/2023   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   Unstable angina (HCC) 07/27/2021   Hiatal hernia 10/30/2019   Subclinical hypothyroidism 10/04/2018   Low back pain radiating to left lower extremity 04/26/2018   Anxiety 09/29/2017   Osteopenia 09/28/2016   Hyperlipidemia 09/28/2016   Hypersomnia, persistent 07/20/2015    PCP: Alyssa Allwardt  REFERRING PROVIDER: Arlyss Repress Allwardt  REFERRING DIAG: Recurrent falls   THERAPY DIAG:  Other abnormalities of gait and mobility  Muscle weakness (generalized)  Rationale for Evaluation and Treatment: Rehabilitation  ONSET DATE: 1- 2months ago  SUBJECTIVE:   SUBJECTIVE STATEMENT: Pt has been on UTI meds, does not feel that it has gone away. She continues to report significant exhaustion. Stayed in bed for about 2-3 days, didn't get up one  day until 9 pm to eat. She also states increased stress which may be playing a roll.   Eval: Pt with at least 2-3 recent falls. She feels like leg is giving out on her but is unable to pinpoint what is happening. Always lands on L side/hand , so she thinks its her L leg.  No previous L knee issues, does report some cramping in L LE recently  more when walking on uneven ground at beach She Has had back issues in the past with previous injection. About 10 yrs ago, but reports no back pain now or since then.  Denies any dizziness, lightheadedness, etc.  She had heart surgery about 1 year ago, then had  cancer removal on R toe in Jan/feb. She wore boot for a while on foot.  Reports being more sedentary lately due to these surgeries. She has not been able to return to her regular activity level since then.  She Did have recent stress test , states good results.    PERTINENT HISTORY:  PAIN:  Are you having pain? No  PRECAUTIONS: None  WEIGHT BEARING RESTRICTIONS: No  FALLS:  Has patient fallen in last 6 months? Yes. Number of falls 2-3  PLOF: Independent  PATIENT GOALS: decrease falls  NEXT MD VISIT:   OBJECTIVE:   DIAGNOSTIC FINDINGS:   PATIENT SURVEYS:   COGNITION: Overall cognitive status: Within functional limits for tasks assessed     SENSATION:  EDEMA:  None   POSTURE: No Significant postural limitations  PALPATION: Well healed incision at R toe from recent skin CA removal, mild redness, dry skin, and mild scar tissue.  Painful callous over 5th met head/laterally on R foot.  LOWER EXTREMITY ROM: Hip : WNL Knees: WNL Ankles: R: mod/significant  limitation for DF(prior injury and surgery); L : mild/mod limitation for DF    LOWER EXTREMITY MMT:  MMT Right eval Left eval  Hip flexion 3- 3-  Hip extension    Hip abduction 3- 3-  Hip adduction    Hip internal rotation    Hip external rotation    Knee flexion 4- 4-  Knee extension 4 4  Ankle dorsiflexion    Ankle plantarflexion    Ankle inversion    Ankle eversion     (Blank rows = not tested)  LOWER EXTREMITY SPECIAL TESTS:  Neg ant drawer bil  FUNCTIONAL TESTS:  Step ups: weakness in L>R  GAIT: Distance walked: 179ft  Assistive device utilized: None Level of assistance: Complete Independence Comments:    .  TODAY'S TREATMENT:                                                                                                                              DATE:   02/21/2023 Therapeutic Exercise: Aerobic: Supine:  SLR 2 x 10 bil;  bridging x 10;  Seated: Standing:  HR x 20;  marching x  20; Step ups 6 in x 10 bil;  Stretches:   Neuromuscular Re-education: Manual Therapy: Self Care: Discussed importance of trying to get up each day, as well as diet, and eating more nutritious foods each day, and frequent throughout the day.     Therapeutic Exercise: Aerobic: Supine:  SLR 2 x 5 bil;  Bridging 3 x 5;  Supine hip and quad stretch 10 sec x 5 on L;  Seated: LAQ 2 x 10 bil (no weight ) ;   Standing:  Stretches:  Standing DF stretch 30 sec x 2 bil;  Neuromuscular Re-education: Manual Therapy: Physical performance measure:    PATIENT EDUCATION:  Education details: updated and reviewed HEP Person educated: Patient Education method: Explanation, Demonstration, Tactile cues, Verbal cues, and Handouts Education comprehension: verbalized understanding, returned demonstration, verbal cues required, tactile cues required, and needs further education   HOME EXERCISE PROGRAM:  Access Code: [email or web address] URL: [email or web address] Date: 02/01/2023 Prepared by: Sedalia Muta  Exercises - Supine Bridge  - 1 x daily - 2 sets - 10 reps - Seated Knee Extension AROM  - 1 x daily - 2 sets - 10 reps - Heel Raises with Counter Support  - 1 x daily - 2 sets - 10 reps - Standing March with Counter Support  - 1 x daily - 2 sets - 10 reps - Sit to Stand  - 1 x daily - 2 sets - 10 reps   ASSESSMENT:  CLINICAL IMPRESSION: Discussed healthy eating, frequency and eating enough protein. We also discussed need to get MD appt, she will try to get appt today, for other issues of UTI and significant fatigue, as this is a newer issue for her. She has good tolerance for activities done today, but states getting tired very easily when home. Will continue to progress activity as tolerated.   Eval: Patient presents with primary complaint of having new, recrrent falls,  2 in last month.She is unable to tell what is causing her to fall. She does have quite a bit of weakness in Hips with  testing today, but no significant weakness in knee or laxity in knee that may be contributing to falls. She denies other dizziness, lightheadedness at time of falls. She is generally deconditioned from having heart surgery about 1 year ago, then having toe CA removal in recent months as well, so she has been more sedentary. She will benefit from general conditioning to improve activity level and tolerance for functional activity. Unclear reason for several recent falls. Will continue dynamic balance testing next visit to further assess fall risk. Pt to benefit from skilled PT to improve deficits and reduce fall risk.    OBJECTIVE IMPAIRMENTS: Abnormal gait, decreased activity tolerance, decreased balance, decreased coordination, decreased endurance, decreased knowledge of use of DME, decreased mobility, difficulty walking, decreased strength, and increased muscle spasms.   ACTIVITY LIMITATIONS: carrying, bending, standing, squatting, stairs, and locomotion level  PARTICIPATION LIMITATIONS: cleaning, laundry, shopping, and community activity  PERSONAL FACTORS:  none  are also affecting patient's functional outcome.   REHAB POTENTIAL: Good  CLINICAL DECISION MAKING: Stable/uncomplicated  EVALUATION COMPLEXITY: Low   GOALS: Goals reviewed with patient? Yes  SHORT TERM GOALS: Target date: 02/07/2023   Pt to be independent with initial HEP  Goal status: INITIAL  2.  Pt to demo ability for at least 5 SLR bil;    Goal status: INITIAL   LONG TERM GOALS: Target date: 03/21/2023   Pt to be independent with final HEP  Goal status: INITIAL  2.  Pt to demo improved  strength of bil LEs to at least 4/5 for hips and 4+/5 for knees, to improve ability for stairs and gait.   Goal status: INITIAL  3.  Dynamic balance goal : Pt to demo improved score on DGI by at least 3 points.   Goal status: INITIAL  4.  Pt to report walking at least 3 x/wk for up to 20 min. :  Goal status:  INITIAL   PLAN:  PT FREQUENCY: 1-2x/week  PT DURATION: 8 weeks  PLANNED INTERVENTIONS: Therapeutic exercises, Therapeutic activity, Neuromuscular re-education, Balance training, Gait training, Patient/Family education, Self Care, and Joint mobilization  PLAN FOR NEXT SESSION:  more balance testing, dynamic, hip abd, LE strength.  DF stretch  Sedalia Muta, PT, DPT 12:09 PM  02/21/23  PHYSICAL THERAPY DISCHARGE SUMMARY  Visits from Start of Care: 4   Plan: Patient agrees to discharge.  Patient goals were partially met. Patient is being discharged due to - not returning after visit 4     Sedalia Muta, PT, DPT 2:48 PM  06/26/23

## 2023-02-21 NOTE — Progress Notes (Signed)
Urine culture does confirm urinary tract infection but the antibiotic we have her on should have treated this.  She should let us know if symptoms are not improving.

## 2023-02-21 NOTE — Progress Notes (Signed)
Pt is here for B12 shot for [employer]. Injection given in left deltoid. Pt tolerated well.

## 2023-02-22 ENCOUNTER — Other Ambulatory Visit: Payer: Self-pay | Admitting: Cardiology

## 2023-02-22 LAB — URINE CULTURE
MICRO NUMBER:: 15008523
Result:: NO GROWTH
SPECIMEN QUALITY:: ADEQUATE

## 2023-02-23 ENCOUNTER — Other Ambulatory Visit: Payer: Self-pay

## 2023-02-23 ENCOUNTER — Encounter: Payer: Self-pay | Admitting: Physician Assistant

## 2023-02-23 ENCOUNTER — Ambulatory Visit (INDEPENDENT_AMBULATORY_CARE_PROVIDER_SITE_OTHER)
Admission: RE | Admit: 2023-02-23 | Discharge: 2023-02-23 | Disposition: A | Discharge status: Home / Self Care | Payer: Medicare Other | Source: Ambulatory Visit | Attending: Physician Assistant | Admitting: Physician Assistant

## 2023-02-23 ENCOUNTER — Encounter: Payer: Medicare Other | Admitting: Physical Therapy

## 2023-02-23 ENCOUNTER — Ambulatory Visit: Payer: Medicare Other | Admitting: Physician Assistant

## 2023-02-23 VITALS — BP 132/70 | HR 58 | Temp 97.1°F | Ht 64.5 in | Wt 144.6 lb

## 2023-02-23 DIAGNOSIS — R058 Other specified cough: Secondary | ICD-10-CM | POA: Diagnosis not present

## 2023-02-23 DIAGNOSIS — R5383 Other fatigue: Secondary | ICD-10-CM

## 2023-02-23 DIAGNOSIS — Z1211 Encounter for screening for malignant neoplasm of colon: Secondary | ICD-10-CM

## 2023-02-23 DIAGNOSIS — N898 Other specified noninflammatory disorders of vagina: Secondary | ICD-10-CM

## 2023-02-23 DIAGNOSIS — E538 Deficiency of other specified B group vitamins: Secondary | ICD-10-CM | POA: Diagnosis not present

## 2023-02-23 LAB — POC COVID19 BINAXNOW: SARS Coronavirus 2 Ag: NEGATIVE

## 2023-02-23 MED ORDER — FLUCONAZOLE 150 MG PO TABS: 150.0000 mg | ORAL_TABLET | Freq: Every day | ORAL | 0 refills | Status: DC

## 2023-02-23 MED ORDER — PROMETHAZINE-DM 6.25-15 MG/5ML PO SYRP: 5.0000 mL | ORAL_SOLUTION | Freq: Every evening | ORAL | 0 refills | Status: DC | PRN

## 2023-02-23 MED ORDER — CYANOCOBALAMIN 1000 MCG/ML IJ SOLN
1000.0000 ug | Freq: Once | INTRAMUSCULAR | Status: AC
Start: 2023-02-23 — End: 2023-02-23
  Administered 2023-02-23: 1000 ug via INTRAMUSCULAR

## 2023-02-23 NOTE — Progress Notes (Signed)
Subjective:    Patient ID: Marissa Lee, female    DOB: 09/09/1951, 72 y.o.   MRN: [id number]  Chief Complaint  Patient presents with   Medical Management of Chronic Issues    Pt in the office still stating she isn't feeling well, thought possible UTI but urine culture shows no infection; pt states she has a cough she can't get rid of and just extremely fatigued;     HPI Patient is in today for lack of energy. Says she is staying in the bed for days at a time over this weekend. Prior week spent a lot of time helping with grandkids and her sister.   She has developed a cough in the last two days and some nasal / sinus congestion. Feels like it is hard to swallow today, appetite has been limited and she was afraid she might choke. Water went down ok today.  States she had a UTI last week, felt like symptoms never really went away. Having some itching and burning symptoms despite completing antibiotics. Repeat urine culture this week was negative.   Denies any recent sick contacts.   Hx of low B12.  Also says she has been having "hot flashes" again similar to initial menopause symptoms.   Not UTD on colon or breast cancer screenings.   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/2022   Osteopenia 08/2016   hip and spine   Subclinical hypothyroidism 10/04/2018   Urge incontinence 06/05/2013    Past Surgical History:  Procedure Laterality Date   ABDOMINAL HYSTERECTOMY     BLADDER SUSPENSION  2004   done with [facility]   BREAST BIOPSY  1980   done 3x   CLOSED REDUCTION METACARPAL WITH PERCUTANEOUS PINNING Right 08/24/2016   Procedure: CLOSED REDUCTION  WITH PERCUTANEOUS PINNING;  Surgeon: Bradly Bienenstock, MD;  Location: [facility]  OR;  Service: Orthopedics;  Laterality: Right;   COLONOSCOPY     COMBINED HYSTERECTOMY VAGINAL / OOPHORECTOMY / A&P REPAIR  2004   CORONARY ARTERY BYPASS GRAFT N/A 07/28/2021   Procedure: CORONARY ARTERY BYPASS GRAFTING (CABG) times two using left internal mammary artery and  left leg saphenous vein;  Surgeon: Loreli Slot, MD;  Location: [facility] OR;  Service: Open Heart Surgery;  Laterality: N/A;   FOOT SURGERY Right 1992   IABP INSERTION Right 07/28/2021   Procedure: IABP Insertion;  Surgeon: Marykay Lex, MD;  Location: [facility] INVASIVE CV LAB;  Service: Cardiovascular;  Laterality: Right;   LEFT HEART CATH AND CORONARY ANGIOGRAPHY N/A 07/28/2021   Procedure: LEFT HEART CATH AND CORONARY ANGIOGRAPHY;  Surgeon: Marykay Lex, MD;  Location: [facility] INVASIVE CV LAB;  Service: Cardiovascular;  Laterality: N/A;   OPEN REDUCTION INTERNAL FIXATION (ORIF) HAND Right 08/24/2016   Procedure: RIGHT HAND LONG RING AND SMALL FINGER  POSSIBLE ORIF;  Surgeon: Bradly Bienenstock, MD;  Location: [facility] OR;  Service: Orthopedics;  Laterality: Right;   WISDOM TOOTH EXTRACTION  1978    Family History  Problem Relation Age of Onset   Cancer Paternal Grandmother        Colon    Cancer Paternal Grandfather  Lung   Heart disease Mother        valvular disease, valve replacement   Atrial fibrillation Mother    Stroke Mother    Heart disease Brother        valve disease, valve surgery   Thyroid disease Maternal Grandmother        Goiter   Thyroid disease Daughter        Hypothyroid    Social History   Tobacco Use   Smoking status: Former    Packs/day: 1.00    Years: 20.00    Additional pack years: 0.00    Total pack years: 20.00    Types: Cigarettes    Quit date: 2000    Years since quitting: 24.4   Smokeless tobacco: Never   Tobacco comments:    quit in approximately 2000  Vaping Use   Vaping Use: Never used  Substance Use Topics   Alcohol use: Yes    Alcohol/week: 3.0 - 4.0 standard drinks  of alcohol    Types: 2 Cans of beer, 1 - 2 Standard drinks or equivalent per week    Comment: rarely   Drug use: No     Allergies  Allergen Reactions   Cephalosporins Anaphylaxis and Swelling    Throat swells   Clindamycin/Lincomycin     Metallic taste, blisters behind ears   Codeine Itching   Lisinopril Cough    Review of Systems NEGATIVE UNLESS OTHERWISE INDICATED IN HPI      Objective:     BP 132/70 (BP Location: Left Arm)   Pulse (!) 58   Temp (!) 97.1 F (36.2 C) (Temporal)   Ht 5' 4.5" (1.638 m)   Wt 144 lb 9.6 oz (65.6 kg)   LMP 09/27/2003   SpO2 98%   BMI 24.44 kg/m   Wt Readings from Last 3 Encounters:  02/23/23 144 lb 9.6 oz (65.6 kg)  02/09/23 145 lb 9.6 oz (66 kg)  02/06/23 143 lb 9.6 oz (65.1 kg)    BP Readings from Last 3 Encounters:  02/23/23 132/70  02/09/23 108/64  02/06/23 112/62     Physical Exam Vitals and nursing note reviewed.  Constitutional:      Appearance: Normal appearance. She is normal weight. She is not toxic-appearing.  HENT:     Head: Normocephalic and atraumatic.     Right Ear: Tympanic membrane, ear canal and external ear normal.     Left Ear: Tympanic membrane, ear canal and external ear normal.     Nose: Nose normal.     Comments: Postnasal drip noted    Mouth/Throat:     Mouth: Mucous membranes are moist.  Eyes:     Extraocular Movements: Extraocular movements intact.     Conjunctiva/sclera: Conjunctivae normal.     Pupils: Pupils are equal, round, and reactive to light.  Cardiovascular:     Rate and Rhythm: Normal rate and regular rhythm.     Pulses: Normal pulses.     Heart sounds: Normal heart sounds.  Pulmonary:     Effort: Pulmonary effort is normal.     Breath sounds: Normal breath sounds.     Comments: Frequent productive cough  Musculoskeletal:        General: Normal range of motion.     Cervical back: Normal range of motion and neck supple.  Skin:    General: Skin is warm and dry.  Neurological:      General: No focal deficit present.     Mental Status:  She is alert and oriented to person, place, and time.  Psychiatric:        Mood and Affect: Mood normal.        Behavior: Behavior normal.        Assessment & Plan:  Productive cough -     POC COVID-19 BinaxNow -     DG Chest 2 View; Future  Other fatigue -     DG Chest 2 View; Future -     CBC with Differential/Platelet; Future -     Comprehensive metabolic panel; Future -     TSH; Future -     Vitamin B12; Future  B12 deficiency -     Cyanocobalamin -     Vitamin B12; Future  Vagina itching  Other orders -     Fluconazole; Take 1 tablet (150 mg total) by mouth daily. Repeat dose in 72 hours if still symptomatic.  Dispense: 2 tablet; Refill: 0 -     Promethazine-DM; Take 5 mLs by mouth at bedtime as needed for cough.  Dispense: 120 mL; Refill: 0    Reassured POC COVID-19 test negative. B12 injection updated today.  Labs and CXR today. Treat pending results.  Urine culture negative this week. Diflucan for likely yeast.  Presume she may have picked up a viral illness. Will monitor closely.  Updating breast and colon ca screenings this year.     Return if symptoms worsen or fail to improve.     Jonpaul Lumm M Lateesha Bezold, PA-C

## 2023-02-23 NOTE — Patient Instructions (Addendum)
Your COVID test was negative.  Please proceed to the following address: [facility] [address] [place], [place] [address] Tel: [phone] Hours: M-F 8:30 am - 5:00 pm Closed for lunch 12:30 pm - 1:00 pm  B12 injection updated today. Pending labs may need to go back on regular regimen.  Diflucan to help with presumed yeast infection.   Drink plenty of water, rest.  Keep me posted on how you're doing.  We need to get your colon and breast cancer screenings updated.

## 2023-03-02 ENCOUNTER — Ambulatory Visit: Payer: Medicare Other

## 2023-03-07 ENCOUNTER — Ambulatory Visit: Payer: Medicare Other | Admitting: Physician Assistant

## 2023-03-07 VITALS — BP 162/78 | HR 59 | Temp 97.5°F | Ht 64.5 in | Wt 142.6 lb

## 2023-03-07 DIAGNOSIS — R21 Rash and other nonspecific skin eruption: Secondary | ICD-10-CM | POA: Diagnosis not present

## 2023-03-07 MED ORDER — METHYLPREDNISOLONE ACETATE 80 MG/ML IJ SUSP
80.0000 mg | Freq: Once | INTRAMUSCULAR | Status: AC
Start: 2023-03-07 — End: 2023-03-07
  Administered 2023-03-07: 80 mg via INTRAMUSCULAR

## 2023-03-07 NOTE — Patient Instructions (Signed)
Added diflucan and promethazine to your allergy list - more likely the promethazine caused this reaction with the sun.  Depomedrol 80 mg IM today (a steroid) to help calm symptoms down.  Take Claritin or Zyrtec 10 mg daily for the next 1-2 weeks.   Stay well hydrated and stay out of the sun.  Do not use Benadryl cream, this can worsen rashes sometimes. I would just moisturize with CeraVe or Lubriderm.  Let me know if worse / change in symptoms.  Hope to be resolved by end of this week / early next week.

## 2023-03-07 NOTE — Progress Notes (Signed)
Subjective:    Patient ID: Marissa Lee, female    DOB: [birth year], 72 y.o.   MRN: [id number]  Chief Complaint  Patient presents with   Follow-up    Pt recently seen in UC and back in office for follow up; wondering if a reaction to Diflucan or cough syrup; pt was sitting under porch last Friday and noticed it Friday after being on porch. Pt states not direct sunlight; pt states itching and got [facility] anti itch cream;     Rash    Pt has rash all over arms chest and face; possible interactions    HPI Patient is in today for urgent care f/up - rash.  Started on Diflucan and promethazine 02/23/23.  Stopped promethazine Tuesday 02/28/23. Sitting on front porch with her daughter on 03/03/23. Then started to get a rash on her arms quickly after sitting in sun. Took Benadryl on 03/04/23, this helped the itching. Flared up again with rash, then decided to go into urgent care on 03/05/23. 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   Here today, still very itchy, but no new rash present. No fever or chills. No CP or SOB.   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/2022   Osteopenia 08/2016   hip and spine   Subclinical hypothyroidism 10/04/2018   Urge incontinence 06/05/2013    Past Surgical History:  Procedure Laterality Date   ABDOMINAL HYSTERECTOMY     BLADDER SUSPENSION  2004   [facility] with [facility]   BREAST BIOPSY  1980   done 3x   CLOSED REDUCTION METACARPAL WITH PERCUTANEOUS PINNING Right 08/24/2016   Procedure: CLOSED REDUCTION  WITH PERCUTANEOUS PINNING;  Surgeon: Bradly Bienenstock, MD;  [facility];  Service: Orthopedics;  Laterality: Right;   COLONOSCOPY     COMBINED HYSTERECTOMY VAGINAL /  OOPHORECTOMY / A&P REPAIR  2004   CORONARY ARTERY BYPASS GRAFT N/A 07/28/2021   Procedure: CORONARY ARTERY BYPASS GRAFTING (CABG) times two using left internal mammary artery and  left leg saphenous vein;  Surgeon: Loreli Slot, MD;  Location: [facility] OR;  Service: Open Heart Surgery;  Laterality: N/A;   FOOT SURGERY Right 1992   IABP INSERTION Right 07/28/2021   Procedure: IABP Insertion;  Surgeon: Marykay Lex, MD;  Location: [facility] INVASIVE CV LAB;  Service: Cardiovascular;  Laterality: Right;   LEFT HEART CATH AND CORONARY ANGIOGRAPHY N/A 07/28/2021   Procedure: LEFT HEART CATH AND CORONARY ANGIOGRAPHY;  Surgeon: Marykay Lex, MD;  Location: [facility] INVASIVE CV LAB;  Service: Cardiovascular;  Laterality: N/A;   OPEN REDUCTION INTERNAL FIXATION (ORIF) HAND Right 08/24/2016   Procedure: RIGHT HAND LONG RING AND SMALL FINGER  POSSIBLE ORIF;  Surgeon: Bradly Bienenstock, MD;  [facility]: [facility] OR;  Service: Orthopedics;  Laterality: Right;   WISDOM TOOTH EXTRACTION  1978    Family History  Problem Relation Age of Onset   Cancer Paternal Grandmother        Colon    Cancer Paternal Grandfather        Lung   Heart disease Mother        valvular disease, valve replacement  Atrial fibrillation Mother    Stroke Mother    Heart disease Brother        valve disease, valve surgery   Thyroid disease Maternal Grandmother        Goiter   Thyroid disease Daughter        Hypothyroid    Social History   Tobacco Use   Smoking status: Former    Packs/day: 1.00    Years: 20.00    Additional pack years: 0.00    Total pack years: 20.00    Types: Cigarettes    Quit date: 2000    Years since quitting: 24.4   Smokeless tobacco: Never   Tobacco comments:    quit in approximately 2000  Vaping Use   Vaping Use: Never used  Substance Use Topics   Alcohol use: Yes    Alcohol/week: 3.0 - 4.0 standard drinks of alcohol    Types: 2 Cans of beer, 1 - 2 Standard drinks or equivalent per week    Comment:  rarely   Drug use: No     Allergies  Allergen Reactions   Cephalosporins Anaphylaxis and Swelling    Throat swells   Clindamycin/Lincomycin     Metallic taste, blisters behind ears   Codeine Itching   Lisinopril Cough   Diflucan [Fluconazole] Rash   Promethazine Rash    Review of Systems NEGATIVE UNLESS OTHERWISE INDICATED IN HPI      Objective:     BP (!) 162/78 (BP Location: Left Arm)   Pulse (!) 59   Temp (!) 97.5 F (36.4 C) (Temporal)   Ht 5' 4.5" (1.638 m)   Wt 142 lb 9.6 oz (64.7 kg)   LMP 09/27/2003   SpO2 99%   BMI 24.10 kg/m   Wt Readings from Last 3 Encounters:  03/07/23 142 lb 9.6 oz (64.7 kg)  02/23/23 144 lb 9.6 oz (65.6 kg)  02/09/23 145 lb 9.6 oz (66 kg)    BP Readings from Last 3 Encounters:  03/07/23 (!) 162/78  02/23/23 132/70  02/09/23 108/64     Physical Exam Vitals and nursing note reviewed.  Constitutional:      Appearance: Normal appearance.  Cardiovascular:     Rate and Rhythm: Normal rate and regular rhythm.  Pulmonary:     Effort: Pulmonary effort is normal.     Breath sounds: Normal breath sounds.  Skin:    Findings: Rash (see photo below - rash present bilateral arms and across face) present.  Neurological:     Mental Status: She is alert.  Psychiatric:        Mood and Affect: Mood normal.        Behavior: Behavior normal.        Assessment & Plan:  Rash and nonspecific skin eruption -     methylPREDNISolone Acetate   Added diflucan and promethazine to your allergy list - more likely the promethazine caused this reaction with the sun.  Depomedrol 80 mg IM today (a steroid) to help calm symptoms down.  Take Claritin or Zyrtec 10 mg daily for the next 1-2 weeks.   Stay well hydrated and stay out of the sun.  Do not use Benadryl cream, this can worsen rashes sometimes. I would just moisturize with CeraVe or Lubriderm.  Let me know if worse / change in symptoms.  Hope to be resolved by end of this week / early  next week.      Return if symptoms worsen or fail to improve.  Amaro Mangold M Tomas Schamp, PA-C

## 2023-03-12 ENCOUNTER — Other Ambulatory Visit: Payer: Self-pay | Admitting: Physician Assistant

## 2023-03-29 DIAGNOSIS — L570 Actinic keratosis: Secondary | ICD-10-CM | POA: Insufficient documentation

## 2023-05-03 ENCOUNTER — Ambulatory Visit: Payer: Medicare Other | Admitting: Physician Assistant

## 2023-05-03 VITALS — BP 132/78 | HR 60 | Temp 97.8°F

## 2023-05-03 DIAGNOSIS — R0981 Nasal congestion: Secondary | ICD-10-CM

## 2023-05-03 DIAGNOSIS — R051 Acute cough: Secondary | ICD-10-CM

## 2023-05-03 DIAGNOSIS — R5383 Other fatigue: Secondary | ICD-10-CM | POA: Diagnosis not present

## 2023-05-03 DIAGNOSIS — E038 Other specified hypothyroidism: Secondary | ICD-10-CM

## 2023-05-03 DIAGNOSIS — E538 Deficiency of other specified B group vitamins: Secondary | ICD-10-CM

## 2023-05-03 DIAGNOSIS — R300 Dysuria: Secondary | ICD-10-CM | POA: Diagnosis not present

## 2023-05-03 LAB — COMPREHENSIVE METABOLIC PANEL
ALT: 20 U/L (ref 0–35)
AST: 21 U/L (ref 0–37)
Albumin: 4.3 g/dL (ref 3.5–5.2)
Alkaline Phosphatase: 52 U/L (ref 39–117)
BUN: 11 mg/dL (ref 6–23)
CO2: 30 mEq/L (ref 19–32)
Calcium: 9.9 mg/dL (ref 8.4–10.5)
Chloride: 103 mEq/L (ref 96–112)
Creatinine, Ser: 0.88 mg/dL (ref 0.40–1.20)
GFR: 66.46 mL/min (ref >60.00)
Glucose, Bld: 92 mg/dL (ref 70–99)
Potassium: 4 mEq/L (ref 3.5–5.1)
Sodium: 141 mEq/L (ref 135–145)
Total Bilirubin: 0.4 mg/dL (ref 0.2–1.2)
Total Protein: 7.3 g/dL (ref 6.0–8.3)

## 2023-05-03 LAB — CBC WITH DIFFERENTIAL/PLATELET
Basophils Absolute: 0 10*3/uL (ref 0.0–0.1)
Basophils Relative: 0.5 % (ref 0.0–3.0)
Eosinophils Absolute: 0.1 10*3/uL (ref 0.0–0.7)
Eosinophils Relative: 1.5 % (ref 0.0–5.0)
HCT: 42.9 % (ref 36.0–46.0)
Hemoglobin: 13.9 g/dL (ref 12.0–15.0)
Lymphocytes Relative: 33.3 % (ref 12.0–46.0)
Lymphs Abs: 2.3 10*3/uL (ref 0.7–4.0)
MCHC: 32.5 g/dL (ref 30.0–36.0)
MCV: 96.3 fl (ref 78.0–100.0)
Monocytes Absolute: 0.6 10*3/uL (ref 0.1–1.0)
Monocytes Relative: 9.2 % (ref 3.0–12.0)
Neutro Abs: 3.9 10*3/uL (ref 1.4–7.7)
Neutrophils Relative %: 55.5 % (ref 43.0–77.0)
Platelets: 415 10*3/uL — ABNORMAL HIGH (ref 150.0–400.0)
RBC: 4.45 Mil/uL (ref 3.87–5.11)
RDW: 14.2 % (ref 11.5–15.5)
WBC: 7.1 10*3/uL (ref 4.0–10.5)

## 2023-05-03 LAB — POC URINALSYSI DIPSTICK (AUTOMATED)
Bilirubin, UA: NEGATIVE
Blood, UA: NEGATIVE
Glucose, UA: NEGATIVE
Ketones, UA: NEGATIVE
Leukocytes, UA: NEGATIVE
Nitrite, UA: NEGATIVE
Protein, UA: NEGATIVE
Spec Grav, UA: 1.025 (ref 1.010–1.025)
Urobilinogen, UA: 0.2 E.U./dL
pH, UA: 5 (ref 5.0–8.0)

## 2023-05-03 LAB — POCT INFLUENZA A/B
Influenza A, POC: NEGATIVE
Influenza B, POC: NEGATIVE

## 2023-05-03 LAB — TSH: TSH: 4.75 u[IU]/mL (ref 0.35–5.50)

## 2023-05-03 LAB — VITAMIN B12: Vitamin B-12: 524 pg/mL (ref 211–911)

## 2023-05-03 LAB — POC COVID19 BINAXNOW: SARS Coronavirus 2 Ag: NEGATIVE

## 2023-05-03 NOTE — Progress Notes (Signed)
Subjective:    Patient ID: Marissa Lee, female    DOB: November 01, 1950, 72 y.o.   MRN: [id number]  Chief Complaint  Patient presents with   Cough   Nasal Congestion   Fatigue   Night Sweats    HPI Patient is in today for acute sick symptoms.  Symptom onset: Two weeks ago - started after trip to [place] Pertinent positives: coughing, fatigue, congested, night sweats, dysuria  Pertinent negatives: headache, dizziness, rash, joint pain, diarrhea / constipation, fever   Treatments tried: throat spray, cough drops Sick exposure: recent travel with daughter (daughter not ill)  COVID-19 home test negative yesterday   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/2022   Osteopenia 08/2016   hip and spine   Subclinical hypothyroidism 10/04/2018   Urge incontinence 06/05/2013    Past Surgical History:  Procedure Laterality Date   ABDOMINAL HYSTERECTOMY     BLADDER SUSPENSION  2004   done with [facility]   BREAST BIOPSY  1980   done 3x   CLOSED REDUCTION METACARPAL WITH PERCUTANEOUS PINNING Right 08/24/2016   Procedure: CLOSED REDUCTION  WITH PERCUTANEOUS PINNING;  Surgeon: Bradly Bienenstock, MD;  [facility];  Service: Orthopedics;  Laterality: Right;   COLONOSCOPY     COMBINED HYSTERECTOMY VAGINAL / OOPHORECTOMY / A&P REPAIR  2004   CORONARY ARTERY BYPASS GRAFT N/A 07/28/2021   Procedure: CORONARY ARTERY BYPASS GRAFTING (CABG) times two using left internal mammary artery and  left leg saphenous vein;  Surgeon: Loreli Slot, MD;  Location: [facility] OR;  Service: Open Heart Surgery;  Laterality: N/A;   FOOT SURGERY Right 1992   IABP INSERTION Right 07/28/2021   Procedure: IABP Insertion;  Surgeon: Marykay Lex, MD;  Location:  [facility] INVASIVE CV LAB;  Service: Cardiovascular;  Laterality: Right;   LEFT HEART CATH AND CORONARY ANGIOGRAPHY N/A 07/28/2021   Procedure: LEFT HEART CATH AND CORONARY ANGIOGRAPHY;  Surgeon: Marykay Lex, MD;  Location: [facility] INVASIVE CV LAB;  Service: Cardiovascular;  Laterality: N/A;   OPEN REDUCTION INTERNAL FIXATION (ORIF) HAND Right 08/24/2016   Procedure: RIGHT HAND LONG RING AND SMALL FINGER  POSSIBLE ORIF;  Surgeon: Bradly Bienenstock, MD;  [facility];  Service: Orthopedics;  Laterality: Right;   WISDOM TOOTH EXTRACTION  1978    Family History  Problem Relation Age of Onset   Cancer Paternal Grandmother        Colon    Cancer Paternal Grandfather        Lung   Heart disease Mother        valvular disease, valve replacement   Atrial fibrillation Mother    Stroke Mother    Heart disease Brother        valve disease, valve surgery   Thyroid disease Maternal Grandmother        Goiter   Thyroid disease Daughter        Hypothyroid    Social History   Tobacco Use   Smoking status: Former    Current packs/day: 0.00    Average packs/day: 1 pack/day for 20.0 years (20.0 ttl pk-yrs)    Types: Cigarettes    Start date: 74  Quit date: 2000    Years since quitting: 24.6   Smokeless tobacco: Never   Tobacco comments:    quit in approximately 2000  Vaping Use   Vaping status: Never Used  Substance Use Topics   Alcohol use: Yes    Alcohol/week: 3.0 - 4.0 standard drinks of alcohol    Types: 2 Cans of beer, 1 - 2 Standard drinks or equivalent per week    Comment: rarely   Drug use: No     Allergies  Allergen Reactions   Cephalosporins Anaphylaxis and Swelling    Throat swells   Clindamycin/Lincomycin     Metallic taste, blisters behind ears   Codeine Itching   Lisinopril Cough   Diflucan [Fluconazole] Rash   Promethazine Rash    Review of Systems NEGATIVE UNLESS OTHERWISE INDICATED IN HPI      Objective:     BP 132/78   Pulse 60   Temp 97.8 F (36.6 C)  (Temporal)   LMP 09/27/2003   SpO2 97%   Wt Readings from Last 3 Encounters:  03/07/23 142 lb 9.6 oz (64.7 kg)  02/23/23 144 lb 9.6 oz (65.6 kg)  02/09/23 145 lb 9.6 oz (66 kg)    BP Readings from Last 3 Encounters:  05/03/23 132/78  03/07/23 (!) 162/78  02/23/23 132/70     Physical Exam Vitals and nursing note reviewed.  Constitutional:      General: She is not in acute distress.    Appearance: Normal appearance. She is not ill-appearing.     Comments: Appears tired  HENT:     Head: Normocephalic.     Right Ear: Tympanic membrane, ear canal and external ear normal. There is no impacted cerumen.     Left Ear: Tympanic membrane, ear canal and external ear normal. There is no impacted cerumen.     Nose: Congestion present.     Mouth/Throat:     Mouth: Mucous membranes are moist.     Pharynx: No oropharyngeal exudate or posterior oropharyngeal erythema.  Eyes:     Extraocular Movements: Extraocular movements intact.     Conjunctiva/sclera: Conjunctivae normal.     Pupils: Pupils are equal, round, and reactive to light.  Cardiovascular:     Rate and Rhythm: Normal rate and regular rhythm.     Pulses: Normal pulses.     Heart sounds: Normal heart sounds. No murmur heard. Pulmonary:     Effort: Pulmonary effort is normal. No respiratory distress.     Breath sounds: Normal breath sounds. No wheezing.  Musculoskeletal:     Cervical back: Normal range of motion.     Right lower leg: No edema.     Left lower leg: No edema.  Skin:    General: Skin is warm.     Findings: No rash.  Neurological:     General: No focal deficit present.     Mental Status: She is alert and oriented to person, place, and time.     Cranial Nerves: No cranial nerve deficit.     Motor: No weakness.     Gait: Gait normal.  Psychiatric:        Mood and Affect: Mood normal.        Behavior: Behavior normal.        Assessment & Plan:  Acute cough -     POCT Influenza A/B -     POC COVID-19  BinaxNow -     CBC with Differential/Platelet -     Comprehensive  metabolic panel  Nasal congestion -     POCT Influenza A/B -     POC COVID-19 BinaxNow  Other fatigue -     CBC with Differential/Platelet -     Comprehensive metabolic panel -     Vitamin B12 -     TSH  Dysuria -     POCT Urinalysis Dipstick (Automated) -     Urine Culture  B12 deficiency -     Vitamin B12  Subclinical hypothyroidism -     TSH   Suspect fatigue secondary to post-viral illness & travel. Reassured flu and COVID-19 both negative. Plan to check labs, treat pending any abnormal results there. Send urine for culture. Encouraged her to rest, keep hydrated, monitor vitals and symptoms. We can consider short course of prednisone to help with some of her congestion. Plain sudafed and Mucinex OTC is fine.   ER if any sudden / worse symptoms.     Return if symptoms worsen or fail to improve.    M , PA-C

## 2023-05-04 ENCOUNTER — Telehealth: Payer: Self-pay | Admitting: Physician Assistant

## 2023-05-04 NOTE — Telephone Encounter (Signed)
Patient requested that I make sure Dr Doloris Hall received the message from patient through [facility] regarding some questions she has regarding the tests that she had done 05/03/2023.  Thank you Gabriel Cirri [facility] AWV TEAM Direct Dial [phone]

## 2023-05-05 NOTE — Telephone Encounter (Signed)
Tried reaching out to pt, LVM in regards.

## 2023-05-25 ENCOUNTER — Encounter: Payer: Self-pay | Admitting: Physician Assistant

## 2023-06-05 ENCOUNTER — Telehealth: Payer: Self-pay | Admitting: Gastroenterology

## 2023-06-05 NOTE — Telephone Encounter (Signed)
Hi Dr. Barron Alvine,    Supervising Provider 06/05/2023 PM  We received a referral for patient to be seen for a colonoscopy. Patient is also wishing to be seen for loose bowel movements. Patient last had a colonoscopy in 2016 with Dr. Loreta Ave. Patient is requesting to transfer her care due to being under the [place] umbrella and would like her gastroenterologist within [place]. Patient previous records are in [facility] media for you to review and advise on scheduling.    Thank you.

## 2023-06-06 ENCOUNTER — Encounter: Payer: Self-pay | Admitting: Gastroenterology

## 2023-06-06 NOTE — Telephone Encounter (Signed)
Called patient to schedule office visit. Left voicemail.

## 2023-06-07 NOTE — Telephone Encounter (Signed)
Please see pt response as Marissa Lee

## 2023-06-09 ENCOUNTER — Telehealth: Payer: Self-pay

## 2023-06-09 NOTE — Telephone Encounter (Signed)
She can take anything she wants, would discuss with PCP

## 2023-06-09 NOTE — Telephone Encounter (Signed)
Patient called and is asking if there any "brain or memory" medication that you recommend that will be safe to take with her current medications. Please advise. You can send her a message on MyChart.

## 2023-06-13 NOTE — Telephone Encounter (Signed)
Spoke with patient, she will be seeing her PCP in a couple of days.

## 2023-06-16 ENCOUNTER — Other Ambulatory Visit: Payer: Self-pay | Admitting: Physician Assistant

## 2023-06-20 ENCOUNTER — Ambulatory Visit: Payer: Medicare Other | Admitting: Physician Assistant

## 2023-06-20 ENCOUNTER — Other Ambulatory Visit: Payer: Self-pay | Admitting: Physician Assistant

## 2023-06-20 VITALS — BP 108/64 | HR 71 | Temp 97.3°F | Ht 64.5 in | Wt 138.8 lb

## 2023-06-20 DIAGNOSIS — Z23 Encounter for immunization: Secondary | ICD-10-CM

## 2023-06-20 DIAGNOSIS — R634 Abnormal weight loss: Secondary | ICD-10-CM | POA: Diagnosis not present

## 2023-06-20 DIAGNOSIS — Z85820 Personal history of malignant melanoma of skin: Secondary | ICD-10-CM

## 2023-06-20 DIAGNOSIS — R5383 Other fatigue: Secondary | ICD-10-CM

## 2023-06-20 DIAGNOSIS — R195 Other fecal abnormalities: Secondary | ICD-10-CM

## 2023-06-20 DIAGNOSIS — Z780 Asymptomatic menopausal state: Secondary | ICD-10-CM

## 2023-06-20 DIAGNOSIS — R413 Other amnesia: Secondary | ICD-10-CM

## 2023-06-20 DIAGNOSIS — Z1231 Encounter for screening mammogram for malignant neoplasm of breast: Secondary | ICD-10-CM

## 2023-06-20 DIAGNOSIS — M8580 Other specified disorders of bone density and structure, unspecified site: Secondary | ICD-10-CM

## 2023-06-20 LAB — COMPREHENSIVE METABOLIC PANEL
ALT: 18 U/L (ref 0–35)
AST: 20 U/L (ref 0–37)
Albumin: 4.1 g/dL (ref 3.5–5.2)
Alkaline Phosphatase: 51 U/L (ref 39–117)
BUN: 10 mg/dL (ref 6–23)
CO2: 31 mEq/L (ref 19–32)
Calcium: 9.6 mg/dL (ref 8.4–10.5)
Chloride: 104 mEq/L (ref 96–112)
Creatinine, Ser: 0.89 mg/dL (ref 0.40–1.20)
GFR: 65.51 mL/min (ref >60.00)
Glucose, Bld: 89 mg/dL (ref 70–99)
Potassium: 4.2 mEq/L (ref 3.5–5.1)
Sodium: 141 mEq/L (ref 135–145)
Total Bilirubin: 0.6 mg/dL (ref 0.2–1.2)
Total Protein: 7 g/dL (ref 6.0–8.3)

## 2023-06-20 LAB — CBC WITH DIFFERENTIAL/PLATELET
Basophils Absolute: 0.1 10*3/uL (ref 0.0–0.1)
Basophils Relative: 1 % (ref 0.0–3.0)
Eosinophils Absolute: 0.1 10*3/uL (ref 0.0–0.7)
Eosinophils Relative: 1.7 % (ref 0.0–5.0)
HCT: 42.7 % (ref 36.0–46.0)
Hemoglobin: 13.8 g/dL (ref 12.0–15.0)
Lymphocytes Relative: 24.7 % (ref 12.0–46.0)
Lymphs Abs: 1.4 10*3/uL (ref 0.7–4.0)
MCHC: 32.4 g/dL (ref 30.0–36.0)
MCV: 96.4 fl (ref 78.0–100.0)
Monocytes Absolute: 0.7 10*3/uL (ref 0.1–1.0)
Monocytes Relative: 11.3 % (ref 3.0–12.0)
Neutro Abs: 3.6 10*3/uL (ref 1.4–7.7)
Neutrophils Relative %: 61.3 % (ref 43.0–77.0)
Platelets: 340 10*3/uL (ref 150.0–400.0)
RBC: 4.43 Mil/uL (ref 3.87–5.11)
RDW: 13.9 % (ref 11.5–15.5)
WBC: 5.8 10*3/uL (ref 4.0–10.5)

## 2023-06-20 LAB — HEMOGLOBIN A1C: Hgb A1c MFr Bld: 5.5 % (ref 4.6–6.5)

## 2023-06-20 LAB — C-REACTIVE PROTEIN: CRP: <1 mg/dL (ref 0.5–20.0)

## 2023-06-20 LAB — SEDIMENTATION RATE: Sed Rate: 8 mm/hr (ref 0–30)

## 2023-06-20 NOTE — Patient Instructions (Addendum)
Today - labs, take stool cards home, complete the stool procedure and bring back ASAP  Consider CT abdomen / pelvis / chest  Consider brain imaging due to memory concerns   Keep follow-up appointments with specialists

## 2023-06-20 NOTE — Progress Notes (Signed)
Subjective:    Patient ID: Marissa Lee, female    DOB: [birth year], 72 y.o.   MRN: [id number]  Chief Complaint  Patient presents with   Follow-up    Pt in the office to discuss care plan and GI results; pt states results were fine but platelets were extremely tired; having bowel issues and very fatigued. Pt overdue for Colonscopy last completed with Dr Loreta Ave; family hx of colon cancer;     HPI Patient is in today for f/up on fatigue and symptoms. She is worried she might have cancer.  Some intermittent incontinence of stool, black change in color, sticky texture.  Gets tired very easily. No strength feeling in her arms. GI appt not til later this year.  "Something just feels off." Also worries that her memory is slipping - misplacing things more easily & forgetting certain names.  Personal history of melanoma R toe - removed in early 2024.   Mammogram scheduled for 06/28/23; well overdue for this.   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/2022   Osteopenia 08/2016   hip and spine   Subclinical hypothyroidism 10/04/2018   Urge incontinence 06/05/2013    Past Surgical History:  Procedure Laterality Date   ABDOMINAL HYSTERECTOMY     BLADDER SUSPENSION  2004   done with [facility]   BREAST BIOPSY  1980   done 3x   CLOSED REDUCTION METACARPAL WITH PERCUTANEOUS PINNING Right 08/24/2016   Procedure: CLOSED REDUCTION  WITH PERCUTANEOUS PINNING;  Surgeon: Bradly Bienenstock, MD;  Location: [facility] OR;  Service: Orthopedics;  Laterality: Right;   COLONOSCOPY     COMBINED HYSTERECTOMY VAGINAL / OOPHORECTOMY / A&P REPAIR  2004   CORONARY ARTERY BYPASS GRAFT N/A 07/28/2021   Procedure: CORONARY ARTERY BYPASS GRAFTING (CABG) times two using  left internal mammary artery and  left leg saphenous vein;  Surgeon: Loreli Slot, MD;  Location: [facility] OR;  Service: Open Heart Surgery;  Laterality: N/A;   FOOT SURGERY Right 1992   IABP INSERTION Right 07/28/2021   Procedure: IABP Insertion;  Surgeon: Marykay Lex, MD;  Location: [facility] INVASIVE CV LAB;  Service: Cardiovascular;  Laterality: Right;   LEFT HEART CATH AND CORONARY ANGIOGRAPHY N/A 07/28/2021   Procedure: LEFT HEART CATH AND CORONARY ANGIOGRAPHY;  Surgeon: Marykay Lex, MD;  Location: [facility] INVASIVE CV LAB;  Service: Cardiovascular;  Laterality: N/A;   OPEN REDUCTION INTERNAL FIXATION (ORIF) HAND Right 08/24/2016   Procedure: RIGHT HAND LONG RING AND SMALL FINGER  POSSIBLE ORIF;  Surgeon: Bradly Bienenstock, MD;  Location: [facility] OR;  Service: Orthopedics;  Laterality: Right;   WISDOM TOOTH EXTRACTION  1978    Family History  Problem Relation Age of Onset   Cancer Paternal Grandmother        Colon    Cancer Paternal Grandfather        Lung   Heart disease Mother        valvular disease, valve replacement   Atrial fibrillation Mother    Stroke Mother    Heart disease Brother        valve disease, valve surgery   Thyroid disease Maternal Grandmother  Goiter   Thyroid disease Daughter        Hypothyroid    Social History   Tobacco Use   Smoking status: Former    Current packs/day: 0.00    Average packs/day: 1 pack/day for 20.0 years (20.0 ttl pk-yrs)    Types: Cigarettes    Start date: 3    Quit date: 2000    Years since quitting: 24.7   Smokeless tobacco: Never   Tobacco comments:    quit in approximately 2000  Vaping Use   Vaping status: Never Used  Substance Use Topics   Alcohol use: Yes    Alcohol/week: 3.0 - 4.0 standard drinks of alcohol    Types: 2 Cans of beer, 1 - 2 Standard drinks or equivalent per week    Comment: rarely   Drug use: No     Allergies  Allergen Reactions   Cephalosporins Anaphylaxis and Swelling    Throat swells    Clindamycin/Lincomycin     Metallic taste, blisters behind ears   Codeine Itching   Lisinopril Cough   Diflucan [Fluconazole] Rash   Promethazine Rash    Review of Systems NEGATIVE UNLESS OTHERWISE INDICATED IN HPI      Objective:     BP 108/64 (BP Location: Left Arm)   Pulse 71   Temp (!) 97.3 F (36.3 C) (Temporal)   Ht 5' 4.5" (1.638 m)   Wt 138 lb 12.8 oz (63 kg)   LMP 09/27/2003   SpO2 96%   BMI 23.46 kg/m   Wt Readings from Last 3 Encounters:  06/20/23 138 lb 12.8 oz (63 kg)  03/07/23 142 lb 9.6 oz (64.7 kg)  02/23/23 144 lb 9.6 oz (65.6 kg)    BP Readings from Last 3 Encounters:  06/20/23 108/64  05/03/23 132/78  03/07/23 (!) 162/78     Physical Exam Vitals and nursing note reviewed.  Constitutional:      General: She is not in acute distress.    Appearance: Normal appearance. She is not ill-appearing.     Comments: Tired, worried-appearing   HENT:     Head: Normocephalic.     Right Ear: Tympanic membrane, ear canal and external ear normal.     Left Ear: Tympanic membrane, ear canal and external ear normal.     Nose: No congestion.     Mouth/Throat:     Mouth: Mucous membranes are moist.     Pharynx: No oropharyngeal exudate or posterior oropharyngeal erythema.  Eyes:     Extraocular Movements: Extraocular movements intact.     Conjunctiva/sclera: Conjunctivae normal.     Pupils: Pupils are equal, round, and reactive to light.  Cardiovascular:     Rate and Rhythm: Normal rate and regular rhythm.     Pulses: Normal pulses.     Heart sounds: Normal heart sounds. No murmur heard. Pulmonary:     Effort: Pulmonary effort is normal. No respiratory distress.     Breath sounds: Normal breath sounds. No wheezing.  Abdominal:     General: Abdomen is flat.     Palpations: Abdomen is soft. There is no mass.  Musculoskeletal:     Cervical back: Normal range of motion.     Right lower leg: No edema.     Left lower leg: No edema.  Skin:    General: Skin  is warm.     Findings: No lesion or rash.  Neurological:     Mental Status: She is alert and oriented to person, place,  and time.     Cranial Nerves: No cranial nerve deficit.     Gait: Gait normal.  Psychiatric:        Mood and Affect: Mood normal.        Behavior: Behavior normal.        Assessment & Plan:  Other fatigue -     CBC with Differential/Platelet -     Comprehensive metabolic panel -     Hemoglobin A1c -     Fecal occult blood, imunochemical; Future -     Sedimentation rate -     C-reactive protein  Abnormal weight loss -     CBC with Differential/Platelet -     Comprehensive metabolic panel -     Hemoglobin A1c -     Fecal occult blood, imunochemical; Future -     Sedimentation rate -     C-reactive protein  Change in stool -     CBC with Differential/Platelet -     Fecal occult blood, imunochemical; Future -     Sedimentation rate -     C-reactive protein  History of melanoma  Osteopenia, unspecified location -     DG Bone Density; Future  Post-menopausal -     DG Bone Density; Future  Immunization due -     Pneumococcal conjugate vaccine 20-valent -     Flu Vaccine Trivalent High Dose (Fluad)  Memory changes    I personally went through patient's recent labs with her and listen to her concerns and her symptoms.  She is very worried that something is not right with her body, such as cancer.  She has lost 6 pounds in the last 4 months unintentionally, which could be cause for concern, or could be secondary to stress and worry.  She is overdue for several screening exams such as colonoscopy and mammogram.  She is scheduled with GI in December.  Her mammogram is scheduled for this upcoming week.  I talked with patient about different options.  We can certainly repeat some labs and also look at stool cards because of her symptoms.  She likes this idea.  I also talked with her about potentially doing a CT of her chest, abdomen, pelvis, which is a  protocol we will follow for unintentional weight loss.  She is not quite sure about this yet just because of the radiation concern with the imaging.  We also talked about potentially looking at a brain MRI because of her new memory concerns.  She says she will think about this as well.  Strict ER precautions discussed with the patient.    Hopefully we can try to find a source of her symptoms soon.  I tried to reassure her to do all she can in the meantime to try to eat well, sleep well, stay well-hydrated.    Return in about 6 weeks (around 08/01/2023) for recheck/follow-up.  This note was prepared with assistance of Conservation officer, historic buildings. Occasional wrong-word or sound-a-like substitutions may have occurred due to the inherent limitations of voice recognition software.  Time Spent: 31 minutes of total time was spent on the date of the encounter performing the following actions: chart review prior to seeing the patient, obtaining history, performing a medically necessary exam, counseling on the treatment plan, placing orders, and documenting in our EHR.       Andree Golphin M Shamarion Coots, PA-C

## 2023-06-22 ENCOUNTER — Ambulatory Visit (INDEPENDENT_AMBULATORY_CARE_PROVIDER_SITE_OTHER): Payer: Medicare Other

## 2023-06-22 DIAGNOSIS — R195 Other fecal abnormalities: Secondary | ICD-10-CM

## 2023-06-22 DIAGNOSIS — R634 Abnormal weight loss: Secondary | ICD-10-CM

## 2023-06-22 DIAGNOSIS — R5383 Other fatigue: Secondary | ICD-10-CM | POA: Diagnosis not present

## 2023-06-22 LAB — FECAL OCCULT BLOOD, IMMUNOCHEMICAL: Fecal Occult Bld: NEGATIVE

## 2023-06-28 ENCOUNTER — Ambulatory Visit
Admission: RE | Admit: 2023-06-28 | Discharge: 2023-06-28 | Disposition: A | Discharge status: Home / Self Care | Payer: Medicare Other | Source: Ambulatory Visit | Attending: Physician Assistant | Admitting: Physician Assistant

## 2023-06-28 DIAGNOSIS — Z1231 Encounter for screening mammogram for malignant neoplasm of breast: Secondary | ICD-10-CM

## 2023-07-04 ENCOUNTER — Telehealth: Payer: Self-pay | Admitting: Cardiology

## 2023-07-04 MED ORDER — METOPROLOL SUCCINATE ER 25 MG PO TB24: 12.5000 mg | ORAL_TABLET | Freq: Every day | ORAL | 1 refills | Status: DC

## 2023-07-04 NOTE — Telephone Encounter (Signed)
*  STAT* If patient is at the pharmacy, call can be transferred to refill team.   1. Which medications need to be refilled? (please list name of each medication and dose if known)    metoprolol succinate (TOPROL-XL) 25 MG 24 hr tablet    2. Which pharmacy/location (including street and city if local pharmacy) is medication to be sent to?  [id number] - [place], [place] - [address]      3. Do they need a 30 day or 90 day supply? 90 day    Pt is out of this medication

## 2023-07-04 NOTE — Telephone Encounter (Signed)
Pt's medication was sent to pt's pharmacy as requested. Confirmation received.  °

## 2023-07-12 ENCOUNTER — Ambulatory Visit: Payer: Medicare Other | Admitting: Cardiology

## 2023-08-31 ENCOUNTER — Telehealth: Payer: Self-pay | Admitting: Cardiology

## 2023-08-31 NOTE — Telephone Encounter (Signed)
Patient notified

## 2023-08-31 NOTE — Telephone Encounter (Signed)
Pt states she would like to know if she can get in a hotub when she goes on vacation. Please advise

## 2023-08-31 NOTE — Telephone Encounter (Signed)
Enjoy!!. No Issues

## 2023-09-08 ENCOUNTER — Ambulatory Visit: Payer: Medicare Other | Admitting: Gastroenterology

## 2023-09-16 ENCOUNTER — Other Ambulatory Visit: Payer: Self-pay | Admitting: Physician Assistant

## 2023-10-18 ENCOUNTER — Telehealth: Payer: Self-pay | Admitting: Cardiology

## 2023-10-18 ENCOUNTER — Encounter: Payer: Self-pay | Admitting: Cardiology

## 2023-10-18 ENCOUNTER — Ambulatory Visit
Admission: RE | Admit: 2023-10-18 | Discharge: 2023-10-18 | Disposition: A | Discharge status: Home / Self Care | Payer: Medicare Other | Source: Ambulatory Visit | Attending: Physician Assistant | Admitting: Physician Assistant

## 2023-10-18 ENCOUNTER — Ambulatory Visit: Payer: Medicare Other | Admitting: Cardiology

## 2023-10-18 ENCOUNTER — Ambulatory Visit: Payer: Medicare Other | Attending: Cardiology | Admitting: Cardiology

## 2023-10-18 VITALS — BP 122/76 | HR 55 | Ht 63.5 in | Wt 137.0 lb

## 2023-10-18 DIAGNOSIS — I25118 Atherosclerotic heart disease of native coronary artery with other forms of angina pectoris: Secondary | ICD-10-CM

## 2023-10-18 DIAGNOSIS — M8580 Other specified disorders of bone density and structure, unspecified site: Secondary | ICD-10-CM

## 2023-10-18 DIAGNOSIS — I200 Unstable angina: Secondary | ICD-10-CM

## 2023-10-18 DIAGNOSIS — I209 Angina pectoris, unspecified: Secondary | ICD-10-CM

## 2023-10-18 DIAGNOSIS — Z780 Asymptomatic menopausal state: Secondary | ICD-10-CM

## 2023-10-18 MED ORDER — ISOSORBIDE MONONITRATE ER 60 MG PO TB24: 60.0000 mg | ORAL_TABLET | Freq: Every day | ORAL | 3 refills | Status: DC

## 2023-10-18 NOTE — Telephone Encounter (Signed)
Pt c/o Shortness Of Breath: STAT if SOB developed within the last 24 hours or pt is noticeably SOB on the phone  1. Are you currently SOB (can you hear that pt is SOB on the phone)? No   2. How long have you been experiencing SOB? Got worse since Christmas seen she has traveled.  She was gone the 1st week in dec and the last week in dec  3. Are you SOB when sitting or when up moving around? Little bite sitting down and more when she is up moving around   4. Are you currently experiencing any other symptoms? Fatigue, Tried, a little bit of chest pressure  Pt had an appt this morning and is wanting to get in ASAP.   Best number [phone]

## 2023-10-18 NOTE — Telephone Encounter (Signed)
I spoke with patient. Chest pressure occurs with exertion at times.  Appointment made for patient to see Dr Anne Fu (office DOD) at 3:20 today

## 2023-10-18 NOTE — Progress Notes (Deleted)
Cardiology Office Note:  .   Date:  10/18/2023  ID:  Marissa Lee, DOB Oct 22, 1950, MRN [id number] PCP: Allwardt, Crist Infante, PA-C  [facility] Providers Cardiologist:  None { Click to update primary MD,subspecialty MD or APP then REFRESH:1}  History of Present Illness: .   Marissa Lee is a 73 y.o. Caucasian female patient coronary disease SP CABG x 2 with LIMA to LAD and SVG to OM-1 on 07/28/2021 for critical left main disease when she presented with unstable angina, hypercholesterolemia presents for 6 month follow up.   Discussed the use of AI scribe software for clinical note transcription with the patient, who gave verbal consent to proceed.  History of Present Illness            Labs   Lab Results  Component Value Date   CHOL 172 05/05/2022   HDL 67.00 05/05/2022   LDLCALC 88 05/05/2022   TRIG 85.0 05/05/2022   CHOLHDL 3 05/05/2022   Lab Results  Component Value Date   NA 141 06/20/2023   K 4.2 06/20/2023   CO2 31 06/20/2023   GLUCOSE 89 06/20/2023   BUN 10 06/20/2023   CREATININE 0.89 06/20/2023   CALCIUM 9.6 06/20/2023   GFR 65.51 06/20/2023   EGFR 75 11/18/2021   GFRNONAA >60 04/26/2022      Latest Ref Rng & Units 06/20/2023    1:51 PM 05/03/2023   11:34 AM 01/10/2023    3:03 PM  BMP  Glucose 70 - 99 mg/dL 89  92  034   BUN 6 - 23 mg/dL 10  11  13    Creatinine 0.40 - 1.20 mg/dL 7.42  5.95  6.38   Sodium 135 - 145 mEq/L 141  141  140   Potassium 3.5 - 5.1 mEq/L 4.2  4.0  5.0   Chloride 96 - 112 mEq/L 104  103  104   CO2 19 - 32 mEq/L 31  30  29    Calcium 8.4 - 10.5 mg/dL 9.6  9.9  9.4       Latest Ref Rng & Units 06/20/2023    1:51 PM 05/03/2023   11:34 AM 01/10/2023    3:03 PM  CBC  WBC 4.0 - 10.5 K/uL 5.8  7.1  6.2   Hemoglobin 12.0 - 15.0 g/dL 75.6  43.3  29.5   Hematocrit 36.0 - 46.0 % 42.7  42.9  40.4   Platelets 150.0 - 400.0 K/uL 340.0  415.0  348.0    ***ROS  Physical Exam:   VS:  LMP 09/27/2003    Wt Readings from Last 3 Encounters:   06/20/23 138 lb 12.8 oz (63 kg)  03/07/23 142 lb 9.6 oz (64.7 kg)  02/23/23 144 lb 9.6 oz (65.6 kg)     ***Physical Exam  Studies Reviewed: .    MYOCARDIAL PERFUSION WO LEXISCAN 01/05/2023   Narrative Regadenoson  Nuclear stress test 01/05/2023: Myocardial perfusion is normal. Overall LV systolic function is normal without regional wall motion abnormalities. Stress LV EF: 72%. Nondiagnostic ECG stress. The heart rate response was consistent with Regadenoson. No previous exam available for comparison. Low risk.  ABI 01/24/2023: This exam reveals normal perfusion of the right lower extremity (ABI 1.21).   This exam reveals normal perfusion of the left lower extremity (ABI 1.26).  Normal triphasic waveform pattern at the ankles.  EKG:         EKG 11/17/2022: Normal sinus rhythm with rate of 60 bpm, left axis deviation, left anterior fascicular  block. Incomplete right bundle branch block. Poor R progression, cannot exclude anteroseptal infarct old. Normal QT interval. Nonspecific T abnormality.   Medications and allergies    Allergies  Allergen Reactions   Cephalosporins Anaphylaxis and Swelling    Throat swells   Clindamycin/Lincomycin     Metallic taste, blisters behind ears   Codeine Itching   Lisinopril Cough   Diflucan [Fluconazole] Rash   Promethazine Rash     Current Outpatient Medications:    atorvastatin (LIPITOR) 40 MG tablet, TAKE 1 TABLET(40 MG) BY MOUTH DAILY, Disp: 90 tablet, Rfl: 3   Calcium-Phosphorus-Vitamin D (CITRACAL +D3 PO), Take 1 tablet by mouth daily., Disp: , Rfl:    clopidogrel (PLAVIX) 75 MG tablet, TAKE 1 TABLET(75 MG) BY MOUTH DAILY, Disp: 90 tablet, Rfl: 3   Dextrose, Diabetic Use, (GLUCOSE PO), Take 1 tablet by mouth daily as needed (hypoglycemic episodes)., Disp: , Rfl:    FLUoxetine (PROZAC) 10 MG capsule, TAKE 1 CAPSULE BY MOUTH EVERY DAY, Disp: 90 capsule, Rfl: 0   FLUoxetine (PROZAC) 40 MG capsule, TAKE 1 CAPSULE BY MOUTH EVERY DAY,  Disp: 90 capsule, Rfl: 3   isosorbide mononitrate (IMDUR) 30 MG 24 hr tablet, TAKE 1 TABLET(30 MG) BY MOUTH DAILY, Disp: 90 tablet, Rfl: 3   metoprolol succinate (TOPROL-XL) 25 MG 24 hr tablet, Take 0.5 tablets (12.5 mg total) by mouth daily., Disp: 45 tablet, Rfl: 1   nitroGLYCERIN (NITROSTAT) 0.4 MG SL tablet, Place 1 tablet (0.4 mg total) under the tongue every 5 (five) minutes as needed for up to 25 days for chest pain., Disp: 25 tablet, Rfl: 3   pantoprazole (PROTONIX) 40 MG tablet, TAKE 1 TABLET(40 MG) BY MOUTH EVERY DAY, Disp: 90 tablet, Rfl: 2   ASSESSMENT AND PLAN: .      ICD-10-CM   1. Coronary artery disease of native artery of native heart with stable angina pectoris (HCC)  I25.118     2. Pure hypercholesterolemia  E78.00       1. Coronary artery disease of native artery of native heart with stable angina pectoris (HCC) ***  2. Pure hypercholesterolemia ***  Assessment and Plan                 Signed,  Yates Decamp, MD, [facility] 10/18/2023, 5:54 AM [facility] [address] [place], [place] [address] Phone: [phone]. Fax:  [phone]

## 2023-10-18 NOTE — Progress Notes (Signed)
Cardiology Office Note:  .   Date:  10/18/2023  ID:  Marissa Lee, DOB 03-29-1951, MRN [id number] PCP: Allwardt, Crist Infante, PA-C  [facility] Providers Cardiologist:  Yates Decamp, MD    History of Present Illness: .   Marissa Lee is a 73 y.o. female Discussed the use of AI scribe software for clinical note transcription with the patient, who gave verbal consent to proceed.  History of Present Illness   The patient is a 73 year old with a history of coronary artery disease, status post coronary artery bypass graft (CABG) performed on 07/28/2021 for critical left main disease, substernal chest pain, and hyperlipidemia. She had a recurrence of chest discomfort in February, which was managed with isosorbide 30mg , resulting in no further pain. An echocardiogram on 07/28/2021 showed an ejection fraction (EF) of 65%, and a pharmacological stress test on 01/05/2023 was low risk with an EF of 72% and no ischemia. The patient's ECG showed a normal sinus rhythm with a rate of 60. She has a past issue with bilateral leg cramps.  Recently, the patient has noticed an increase in chest discomfort during physical exertion, such as traveling and carrying luggage. This discomfort is described as a 'grabbing' sensation in the chest. The patient also reports feeling exhausted, sleeping for extended periods, and experiencing shortness of breath and anxiety. The patient's activity level has decreased significantly, with days where she does not leave the house or move her car. The patient has also noted a decrease in appetite and poor eating habits.        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    Studies Reviewed: Marland Kitchen   EKG Interpretation Date/Time:  Wednesday October 18 2023 15:37:02 EST Ventricular Rate:  60 PR Interval:  164 QRS Duration:  80 QT Interval:  398 QTC Calculation: 398 R Axis:   29  Text Interpretation: Normal sinus rhythm Nonspecific T wave abnormality When compared with ECG of 26-Apr-2022  15:55, No significant change since last tracing Confirmed by Donato Schultz ([id number]) on 10/18/2023 3:52:16 PM    Results   DIAGNOSTIC Echocardiogram: EF 65% (07/28/2021) Pharmacologic stress test: Low risk, normal EF 72%, no ischemia (01/05/2023) ECG: Normal sinus rhythm, rate 60     Risk Assessment/Calculations:            Physical Exam:   VS:  BP 122/76   Pulse (!) 55   Ht 5' 3.5" (1.613 m)   Wt 137 lb (62.1 kg)   LMP 09/27/2003   SpO2 97%   BMI 23.89 kg/m    Wt Readings from Last 3 Encounters:  10/18/23 137 lb (62.1 kg)  06/20/23 138 lb 12.8 oz (63 kg)  03/07/23 142 lb 9.6 oz (64.7 kg)    GEN: Well nourished, well developed in no acute distress NECK: No JVD; No carotid bruits CARDIAC: RRR, no murmurs, no rubs, no gallops RESPIRATORY:  Clear to auscultation without rales, wheezing or rhonchi  ABDOMEN: Soft, non-tender, non-distended EXTREMITIES:  No edema; No deformity   ASSESSMENT AND PLAN: .    Assessment and Plan    Coronary Artery Disease (CAD) 73 year old with CAD, status post CABG on 07/28/2021 for critical left main disease, presenting with recurrent substernal chest pain. Recent pharmacologic stress test on 01/05/2023 showed normal EF of 72% and no ischemia. Current ECG shows normal sinus rhythm, rate 60. Reports increased chest pain with exertion during recent travels, likely due to decreased physical activity and deconditioning. Currently on 30 mg of isosorbide with good initial response but  recent recurrence of symptoms. Discussed increasing isosorbide to 60 mg to help relax coronary arteries and reduce chest pain. Informed about potential side effects, including headaches, which typically dissipate over time. Encouraged gradual increase in physical activity to improve conditioning. - Increase isosorbide to 60 mg - Encourage gradual increase in physical activity, starting indoors due to cold weather - Follow-up with APP in 6 months - Follow-up with Dr. Jacinto Halim in 1  year  Hyperlipidemia Managed as part of overall cardiovascular risk reduction strategy. - Continue current lipid-lowering therapy.              Signed, Donato Schultz, MD

## 2023-10-18 NOTE — Patient Instructions (Signed)
Medication Instructions:  Please increase Isosorbide to 60 mg a day. Continue all other medications as listed.  *If you need a refill on your cardiac medications before your next appointment, please call your pharmacy*  Follow-Up: At [facility], you and your health needs are our priority.  As part of our continuing mission to provide you with exceptional heart care, we have created designated Provider Care Teams.  These Care Teams include your primary Cardiologist (physician) and Advanced Practice Providers (APPs -  Physician Assistants and Nurse Practitioners) who all work together to provide you with the care you need, when you need it.  We recommend signing up for the patient portal called "MyChart".  Sign up information is provided on this After Visit Summary.  MyChart is used to connect with patients for Virtual Visits (Telemedicine).  Patients are able to view lab/test results, encounter notes, upcoming appointments, etc.  Non-urgent messages can be sent to your provider as well.   To learn more about what you can do with MyChart, go to [email or web address].    Your next appointment:   6 month(s)  Provider:   Jari Favre, PA-C, Robin Searing, NP, Eligha Bridegroom, NP, Tereso Newcomer, PA-C, or Perlie Gold, PA-C     Then, Dr Jacinto Halim will plan to see you again in 1 year(s).

## 2023-10-19 ENCOUNTER — Other Ambulatory Visit (HOSPITAL_COMMUNITY): Payer: Self-pay

## 2023-10-19 ENCOUNTER — Encounter: Payer: Self-pay | Admitting: Physician Assistant

## 2023-10-26 ENCOUNTER — Ambulatory Visit: Payer: Medicare Other | Admitting: Gastroenterology

## 2023-10-26 ENCOUNTER — Encounter: Payer: Self-pay | Admitting: Gastroenterology

## 2023-10-26 ENCOUNTER — Telehealth: Payer: Self-pay

## 2023-10-26 VITALS — BP 122/62 | HR 68 | Ht 64.0 in | Wt 138.0 lb

## 2023-10-26 DIAGNOSIS — R197 Diarrhea, unspecified: Secondary | ICD-10-CM | POA: Diagnosis not present

## 2023-10-26 DIAGNOSIS — Z7902 Long term (current) use of antithrombotics/antiplatelets: Secondary | ICD-10-CM

## 2023-10-26 DIAGNOSIS — K449 Diaphragmatic hernia without obstruction or gangrene: Secondary | ICD-10-CM | POA: Diagnosis not present

## 2023-10-26 DIAGNOSIS — Z860100 Personal history of colon polyps, unspecified: Secondary | ICD-10-CM

## 2023-10-26 DIAGNOSIS — K219 Gastro-esophageal reflux disease without esophagitis: Secondary | ICD-10-CM | POA: Diagnosis not present

## 2023-10-26 DIAGNOSIS — R194 Change in bowel habit: Secondary | ICD-10-CM | POA: Diagnosis not present

## 2023-10-26 DIAGNOSIS — Z951 Presence of aortocoronary bypass graft: Secondary | ICD-10-CM

## 2023-10-26 MED ORDER — SUFLAVE 178.7 G PO SOLR: 1.0000 | Freq: Once | ORAL | 0 refills | Status: AC

## 2023-10-26 NOTE — Telephone Encounter (Signed)
  Marissa Lee 09/11/1951 [id number]  10-26-23   Dear Dr Jacinto Halim,  We have scheduled the above named patient for an endoscopy/colonoscopy procedure. Our records show that she is on antiplatelet therapy.  Please advise as to whether the patient may come off their therapy of Plavix 5 days prior to their procedure which is scheduled for 11-29-23.  Please route your response to Blondell Reveal or fax response to [phone].  Sincerely,    [facility]

## 2023-10-26 NOTE — Patient Instructions (Addendum)
_______________________________________________________  If your blood pressure at your visit was 140/90 or greater, please contact your primary care physician to follow up on this.  _______________________________________________________  If you are age 72 or older, your body mass index should be between 23-30. Your Body mass index is 23.69 kg/m. If this is out of the aforementioned range listed, please consider follow up with your Primary Care Provider.  If you are age 18 or younger, your body mass index should be between 19-25. Your Body mass index is 23.69 kg/m. If this is out of the aformentioned range listed, please consider follow up with your Primary Care Provider.   ________________________________________________________  The  GI providers would like to encourage you to use [facility] to communicate with providers for non-urgent requests or questions.  Due to long hold times on the telephone, sending your provider a message by [facility] may be a faster and more efficient way to get a response.  Please allow 48 business hours for a response.  Please remember that this is for non-urgent requests.  _______________________________________________________  Your provider has requested that you go to the basement level for lab work before leaving today. Press "B" on the elevator. The lab is located at the first door on the left as you exit the elevator.  Please purchase the following medications over the counter and take as directed:  START: fiber supplement daily.  You have been scheduled for an endoscopy and colonoscopy. Please follow the written instructions given to you at your visit today.  Please pick up your prep supplies at the pharmacy within the next 1-3 days.  If you use inhalers (even only as needed), please bring them with you on the day of your procedure.  DO NOT TAKE 7 DAYS PRIOR TO TEST- Trulicity (dulaglutide) Ozempic, Wegovy (semaglutide) Mounjaro  (tirzepatide) Bydureon Bcise (exanatide extended release)  DO NOT TAKE 1 DAY PRIOR TO YOUR TEST Rybelsus (semaglutide) Adlyxin (lixisenatide) Victoza (liraglutide) Byetta (exanatide) ___________________________________________________________________________  Bonita Quin will receive your bowel preparation through [facility], which ensures the lowest copay and home delivery, with outreach via text or call from an [phone] number. Please respond promptly to avoid rescheduling. If you are interested in alternative options or have any questions please contact them at [phone]  Your Provider Has Sent Your Bowel Prep Regimen To [facility] What to expect. [facility] will contact you to verify your information and collect your copay, if applicable. Enjoy the comfort of your home while we deliver your prescription to you, free of any shipping charges. Fast, FREE delivery or shipping. [facility] accepts all major insurance benefits and applies discounts & coupons  Have additional questions? [facility]'s patient care team is always here to help.  Chat: [email or web address] Call: [phone] Email: [email or web address] NCPDP: [id number] How will we contact you? Welcome Phone call  a Welcome text and a Checkout link in a text Texts you receive from [phone] Are Not Spam.   *To set up delivery, you must complete the checkout process via link or speak to one of our patient care representatives. If we are unable to reach you, your prescription may be delayed.  To avoid waiting on hold if you call. Utilize the secure chat feature and request [facility] call you to complete the transaction or expedite your concerns.  Your provider has ordered "Diatherix" stool testing for you. You have received a kit from our office today containing all necessary supplies to complete this test. Please carefully read the stool collection instructions provided in the kit before opening  the accompanying materials.  In addition, be sure to place the label from the top right corner of the laboratory request sheet onto the "puritan opti-swab" tube that is supplied in the kit. This label should include your full name and date of birth. After completing the test, you should secure the purtian tube into the specimen biohazard bag. The laboratory request information sheet (including date and time of specimen collection) should be placed into the outside pocket of the specimen biohazard bag and returned to the [facility] lab with 2 days of collection.   If the laboratory information sheet specimen date and time are not filled out, the test will NOT be performed.  Due to recent changes in healthcare laws, you may see the results of your imaging and laboratory studies on MyChart before your provider has had a chance to review them.  We understand that in some cases there may be results that are confusing or concerning to you. Not all laboratory results come back in the same time frame and the provider may be waiting for multiple results in order to interpret others.  Please give [place] 48 hours in order for your provider to thoroughly review all the results before contacting the office for clarification of your results.   It was a pleasure to see you today!  Vito Cirigliano, D.O.

## 2023-10-26 NOTE — Progress Notes (Signed)
Chief Complaint: Diarrhea, change in bowel habits, history of colon polyps, GERD   Referring Provider:     Allwardt, Crist Infante, PA-C   HPI:     Marissa Lee is a 73 y.o. female with a history of endometriosis, fibroids, HLD, osteopenia, melanoma, subclinical hypothyroidism, CAD  s/p CABG 07/2021 (on Plavix), anxiety, GERD, hysterectomy, referred to the Gastroenterology Clinic for evaluation of diarrhea, change in bowel habits.  Previously followed with Dr. Loreta Ave, last seen approximately 2022 per patient.  Those notes not available for review.  Has been having loose to watery, non bloody stools intermittently for many years. Thinks there had been some black stools as well. Can have days of no BM as well. Episodic fecal urgency. Has not tried any medications or dietary modifications for this.   Separately, has been having indigestion. Was seen in the Cardiology Clinic last week. EKG unremarkable.  Increased isosorbide to 60 mg. Has been taking Protonix 40 mg/day for years for GERD, which otherwise controls heartburn.  She is concerned about the recent indigestion and requesting EGD.  Reviewed labs from 05/2023: FOBT negative, normal ESR/CRP, normal CBC, CMP, A1c.  No recent abdominal imaging for review.  Family history of colon cancer.  Personal history of colon polyps as below.    Endoscopic History: - 08/05/2013: Colonoscopy: 7 mm proximal ascending colon polyp removed with hot snare (path SSA).  Large amount of residual stool.  Normal TI.  Repeat 1 year  - 10/06/2014: Colonoscopy (with 5-day prep): Two 7 mm rectosigmoid hyperplastic polyps removed with hot snare, small rectal polyp ablated with snare tip.  Normal TI.  Recommended repeat 5 years  - 06/04/2018: EGD: Normal esophagus, small hiatal hernia, 5 mm duodenal bulb polyp (benign hyperplasia)    Past Medical History:  Diagnosis Date   Anxiety    Endometriosis    Fibrocystic breast    muliple drainage   Fibroid     GERD (gastroesophageal reflux disease)    Hiatal hernia    History of bronchitis    Hypercholesteremia 2007   currently not taking anything for it   Melanoma of toe, 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/2022   Osteopenia 08/2016   hip and spine   Postmenopausal state 06/05/2013   Subclinical hypothyroidism 10/04/2018   Urge incontinence 06/05/2013     Past Surgical History:  Procedure Laterality Date   ABDOMINAL HYSTERECTOMY     BLADDER SUSPENSION  2004   done with [facility]   BREAST BIOPSY  1980   done 3x   CLOSED REDUCTION METACARPAL WITH PERCUTANEOUS PINNING Right 08/24/2016   Procedure: CLOSED REDUCTION  WITH PERCUTANEOUS PINNING;  Surgeon: Bradly Bienenstock, MD;  Location: [facility] OR;  Service: Orthopedics;  Laterality: Right;   COLONOSCOPY     COMBINED HYSTERECTOMY VAGINAL / OOPHORECTOMY / A&P REPAIR  2004   CORONARY ARTERY BYPASS GRAFT N/A 07/28/2021   Procedure: CORONARY ARTERY BYPASS GRAFTING (CABG) times two using left internal mammary artery and  left leg saphenous vein;  Surgeon: Loreli Slot, MD;  Location: [facility] OR;  Service: Open Heart Surgery;  Laterality: N/A;   FOOT SURGERY Right 1992   IABP INSERTION Right 07/28/2021   Procedure: IABP Insertion;  Surgeon: Marykay Lex, MD;  Location: [facility] INVASIVE CV LAB;  Service: Cardiovascular;  Laterality: Right;   LEFT HEART CATH AND CORONARY ANGIOGRAPHY N/A 07/28/2021   Procedure: LEFT HEART CATH AND CORONARY ANGIOGRAPHY;  Surgeon: Marykay Lex, MD;  Location: [facility] INVASIVE CV LAB;  Service: Cardiovascular;  Laterality: N/A;   OPEN REDUCTION INTERNAL FIXATION (ORIF) HAND Right 08/24/2016   Procedure: RIGHT HAND LONG RING AND SMALL FINGER  POSSIBLE ORIF;  Surgeon: Bradly Bienenstock, MD;  Location: [facility] OR;  Service: Orthopedics;  Laterality: Right;   WISDOM TOOTH EXTRACTION  1978   Family History  Problem Relation Age of Onset   Heart disease Mother        valvular disease, valve replacement   Atrial fibrillation Mother    Stroke Mother    Heart disease  Brother        valve disease, valve surgery   Thyroid disease Maternal Grandmother        Goiter   Cancer Paternal Grandmother        Colon    Cancer Paternal Grandfather        Lung   Thyroid disease Daughter        Hypothyroid   Esophageal cancer Neg Hx    Liver disease Neg Hx    Social History   Tobacco Use   Smoking status: Former    Current packs/day: 0.00    Average packs/day: 1 pack/day for 20.0 years (20.0 ttl pk-yrs)    Types: Cigarettes    Start date: 34    Quit date: 2000    Years since quitting: 25.0   Smokeless tobacco: Never   Tobacco comments:    quit in approximately 2000  Vaping Use   Vaping status: Never Used  Substance Use Topics   Alcohol use: Yes    Alcohol/week: 3.0 - 4.0 standard drinks of alcohol    Types: 2 Cans of beer, 1 - 2 Standard drinks or equivalent per week    Comment: rarely   Drug use: No   Current Outpatient Medications  Medication Sig Dispense Refill   atorvastatin (LIPITOR) 40 MG tablet TAKE 1 TABLET(40 MG) BY MOUTH DAILY 90 tablet 3   Calcium-Phosphorus-Vitamin D (CITRACAL +D3 PO) Take 1 tablet by mouth daily.     clopidogrel (PLAVIX) 75 MG tablet TAKE 1 TABLET(75 MG) BY MOUTH DAILY 90 tablet 3   Dextrose, Diabetic Use, (GLUCOSE PO) Take 1 tablet by mouth daily as needed (hypoglycemic episodes).     FLUoxetine (PROZAC) 10 MG capsule TAKE 1 CAPSULE BY MOUTH EVERY DAY 90 capsule 0   FLUoxetine (PROZAC) 40 MG capsule TAKE 1 CAPSULE BY MOUTH EVERY DAY 90 capsule 3   isosorbide mononitrate (IMDUR) 60 MG 24 hr tablet Take 1 tablet (60 mg total) by mouth daily. 90 tablet 3   metoprolol succinate (TOPROL-XL) 25 MG 24 hr tablet Take 0.5 tablets (12.5 mg total) by mouth daily. 45 tablet 1   pantoprazole (PROTONIX) 40 MG tablet TAKE 1 TABLET(40 MG) BY MOUTH EVERY DAY 90 tablet 2   nitroGLYCERIN (NITROSTAT) 0.4 MG SL tablet Place 1 tablet (0.4 mg total) under the tongue every 5 (five) minutes as needed for up to 25 days for chest pain. 25  tablet 3   No current facility-administered medications for this visit.   Allergies  Allergen Reactions   Cephalosporins Anaphylaxis and Swelling    Throat swells   Clindamycin/Lincomycin     Metallic taste, blisters behind ears   Codeine Itching   Lisinopril Cough   Diflucan [Fluconazole] Rash   Promethazine Rash     Review of Systems: All systems reviewed and negative except where noted in HPI.     Physical Exam:    Wt Readings from Last  3 Encounters:  10/26/23 138 lb (62.6 kg)  10/18/23 137 lb (62.1 kg)  06/20/23 138 lb 12.8 oz (63 kg)    BP 122/62   Pulse 68   Ht 5\' 4"  (1.626 m)   Wt 138 lb (62.6 kg)   LMP 09/27/2003   BMI 23.69 kg/m  Constitutional:  Pleasant, in no acute distress. Psychiatric: Normal mood and affect. Behavior is normal. Neurological: Alert and oriented to person place and time. Skin: Skin is warm and dry. No rashes noted.   ASSESSMENT AND PLAN;   1) Diarrhea 2) Change in bowel habits Longstanding history of nonbloody diarrhea.  Discussed broad DDx with patient today, to include IBS, microscopic colitis, and less likely IBD with plan for the following:  - Colonoscopy with random and directed biopsies - Check GI PCR panel via [facility] - Check fecal calprotectin - Start fiber supplement such as Citrucel or Benefiber - Diet diary - If evaluation unremarkable, can consider SIBO testing  3) Hx of colon polyps - Due for repeat colonoscopy for ongoing polyp surveillance - Scheduling colonoscopy as above.  Per patient request, will schedule to be done on a Monday  4) GERD 5) Hiatal hernia Longstanding history of reflux.  Index reflux symptoms of heartburn generally well-controlled with Protonix, but has been having breakthrough indigestion lately.  Evaluation in the Cardiology Clinic last week otherwise unremarkable. - Continue Protonix as prescribed - EGD to evaluate for erosive esophagitis and evaluate grade/severity of hiatal  hernia -Antireflux lifestyle/dietary modifications  6) CAD s/p CABG 7) Antiplatelet therapy - Hold Plavix 5 days before procedure - will instruct when and how to resume after procedure. Low but real risk of cardiovascular event such as heart attack, stroke, embolism, thrombosis or ischemia/infarct of other organs off Plavix explained and need to seek urgent help if this occurs. The patient consents to proceed. Will communicate by phone or EMR with patient's prescribing provider to confirm that holding Plavix is reasonable in this case   The indications, risks, and benefits of EGD and colonoscopy were explained to the patient in detail. Risks include but are not limited to bleeding, perforation, adverse reaction to medications, and cardiopulmonary compromise. Sequelae include but are not limited to the possibility of surgery, hospitalization, and mortality. The patient verbalized understanding and wished to proceed. All questions answered, referred to scheduler and bowel prep ordered. Further recommendations pending results of the exam.    Shellia Cleverly, DO, FACG  10/26/2023, 11:24 AM   Allwardt, Crist Infante, PA-C

## 2023-10-26 NOTE — Telephone Encounter (Signed)
Patient can be taken up for the upcoming colonoscopy with low risk, please hold Plavix for 5 days and restart when appropriate.  Yates Decamp, MD, [facility] 10/26/2023, 5:05 PM [facility] [address] [place], [place] [address] Phone: [phone]. Fax:  [phone]

## 2023-10-27 NOTE — Telephone Encounter (Signed)
Patient advised that she has been given clearance to hold Plavix 5 days prior to endo/colon scheduled for 11-29-23.  Patient advised to take last dose of Plavix on 11-22-23, and she will be advised when to restart Plavix by Dr Barron Alvine after the procedure.  Patient agreed to plan and verbalized understanding.  No further questions.

## 2023-11-03 ENCOUNTER — Other Ambulatory Visit: Payer: Medicare Other

## 2023-11-03 DIAGNOSIS — R194 Change in bowel habit: Secondary | ICD-10-CM

## 2023-11-03 DIAGNOSIS — Z951 Presence of aortocoronary bypass graft: Secondary | ICD-10-CM

## 2023-11-03 DIAGNOSIS — R197 Diarrhea, unspecified: Secondary | ICD-10-CM

## 2023-11-03 DIAGNOSIS — K219 Gastro-esophageal reflux disease without esophagitis: Secondary | ICD-10-CM

## 2023-11-03 DIAGNOSIS — K449 Diaphragmatic hernia without obstruction or gangrene: Secondary | ICD-10-CM

## 2023-11-03 DIAGNOSIS — Z860100 Personal history of colon polyps, unspecified: Secondary | ICD-10-CM

## 2023-11-07 ENCOUNTER — Telehealth: Payer: Self-pay

## 2023-11-07 NOTE — Telephone Encounter (Signed)
Faxed received from [facility] with results of stools tests.  Results were read by Dr Leone Payor as negative in Dr Cirilgiano's absence.  Patient made aware of negative results and that we would wait for fecal calprotectin results to come in.  Patient agreed to plan and verbalized understanding.  No further questions.

## 2023-11-08 LAB — CALPROTECTIN, FECAL: Calprotectin, Fecal: 76 ug/g (ref 0–120)

## 2023-11-13 ENCOUNTER — Encounter: Payer: Self-pay | Admitting: Gastroenterology

## 2023-11-17 ENCOUNTER — Other Ambulatory Visit: Payer: Self-pay

## 2023-11-17 MED ORDER — SUFLAVE 178.7 G PO SOLR: 1.0000 | Freq: Once | ORAL | 0 refills | Status: AC

## 2023-11-20 ENCOUNTER — Encounter: Payer: Self-pay | Admitting: Gastroenterology

## 2023-11-27 ENCOUNTER — Encounter: Payer: Self-pay | Admitting: Certified Registered Nurse Anesthetist

## 2023-11-29 ENCOUNTER — Ambulatory Visit: Payer: Medicare Other | Admitting: Gastroenterology

## 2023-11-29 ENCOUNTER — Encounter: Payer: Self-pay | Admitting: Gastroenterology

## 2023-11-29 VITALS — BP 120/65 | HR 59 | Temp 97.8°F | Resp 13 | Ht 64.0 in | Wt 138.0 lb

## 2023-11-29 DIAGNOSIS — K449 Diaphragmatic hernia without obstruction or gangrene: Secondary | ICD-10-CM

## 2023-11-29 DIAGNOSIS — Q396 Congenital diverticulum of esophagus: Secondary | ICD-10-CM

## 2023-11-29 DIAGNOSIS — Z1211 Encounter for screening for malignant neoplasm of colon: Secondary | ICD-10-CM | POA: Diagnosis not present

## 2023-11-29 DIAGNOSIS — Z860100 Personal history of colon polyps, unspecified: Secondary | ICD-10-CM

## 2023-11-29 DIAGNOSIS — D122 Benign neoplasm of ascending colon: Secondary | ICD-10-CM | POA: Diagnosis not present

## 2023-11-29 DIAGNOSIS — K219 Gastro-esophageal reflux disease without esophagitis: Secondary | ICD-10-CM

## 2023-11-29 DIAGNOSIS — R197 Diarrhea, unspecified: Secondary | ICD-10-CM | POA: Diagnosis not present

## 2023-11-29 DIAGNOSIS — R194 Change in bowel habit: Secondary | ICD-10-CM

## 2023-11-29 DIAGNOSIS — D123 Benign neoplasm of transverse colon: Secondary | ICD-10-CM | POA: Diagnosis not present

## 2023-11-29 DIAGNOSIS — D124 Benign neoplasm of descending colon: Secondary | ICD-10-CM | POA: Diagnosis not present

## 2023-11-29 DIAGNOSIS — K635 Polyp of colon: Secondary | ICD-10-CM | POA: Diagnosis not present

## 2023-11-29 MED ORDER — DEXTROSE 5 % IV SOLN: INTRAVENOUS | Status: AC

## 2023-11-29 MED ORDER — SODIUM CHLORIDE 0.9 % IV SOLN
500.0000 mL | INTRAVENOUS | Status: AC
Start: 2023-11-29 — End: 2023-11-30

## 2023-11-29 NOTE — Progress Notes (Signed)
 Upon arrival to admitting, pt's blood sugar-78.  She states, "my doctor says I have hyperglycemia"  She states, "I do feel a little shaky."  D5 IV hung at [facility]  Blood sugar rechecked [id number]

## 2023-11-29 NOTE — Op Note (Signed)
 [facility] Patient Name: Marissa Lee Procedure Date: 11/29/2023 2:41 PM MRN: [id number] Endoscopist: Doristine Locks , MD, [id number] Age: 73 Referring MD:  Date of Birth: Feb 08, 1951 Gender: Female Account #: [id number] Procedure:                Upper GI endoscopy Indications:              Heartburn, Esophageal reflux, Diarrhea/Change in                            bowel habits Medicines:                Monitored Anesthesia Care Procedure:                Pre-Anesthesia Assessment:                           - Prior to the procedure, a History and Physical                            was performed, and patient medications and                            allergies were reviewed. The patient's tolerance of                            previous anesthesia was also reviewed. The risks                            and benefits of the procedure and the sedation                            options and risks were discussed with the patient.                            All questions were answered, and informed consent                            was obtained. Prior Anticoagulants: The patient has                            taken no anticoagulant or antiplatelet agents. ASA                            Grade Assessment: II - A patient with mild systemic                            disease. After reviewing the risks and benefits,                            the patient was deemed in satisfactory condition to                            undergo the procedure.  After obtaining informed consent, the endoscope was                            passed under direct vision. Throughout the                            procedure, the patient's blood pressure, pulse, and                            oxygen saturations were monitored continuously. The                            Olympus scope [id number] was introduced through the                            mouth, and advanced to the second part of  duodenum.                            The upper GI endoscopy was accomplished without                            difficulty. The patient tolerated the procedure                            well. Scope In: Scope Out: Findings:                 A non-bleeding diverticulum with a small opening                            was found in the lower third of the esophagus,                            located 33 cm from the incisors.                           The Z-line was regular and was found 35 cm from the                            incisors.                           A 2 cm hiatal hernia was present.                           The gastroesophageal flap valve was visualized                            endoscopically and classified as Hill Grade III                            (minimal fold, loose to endoscope, hiatal hernia                            likely).  The entire examined stomach was normal. Biopsies                            were taken with a cold forceps for Helicobacter                            pylori testing. Estimated blood loss was minimal.                           The examined duodenum was normal. Biopsies were                            taken with a cold forceps for histology. Estimated                            blood loss was minimal. Complications:            No immediate complications. Estimated Blood Loss:     Estimated blood loss was minimal. Impression:               - Diverticulum in the lower third of the esophagus.                           - Z-line regular, 35 cm from the incisors.                           - 2 cm hiatal hernia.                           - Gastroesophageal flap valve classified as Hill                            Grade III (minimal fold, loose to endoscope, hiatal                            hernia likely).                           - Normal stomach. Biopsied.                           - Normal examined duodenum.  Biopsied. Recommendation:           - Patient has a contact number available for                            emergencies. The signs and symptoms of potential                            delayed complications were discussed with the                            patient. Return to normal activities tomorrow.                            Written discharge instructions were  provided to the                            patient.                           - Resume previous diet.                           - Continue present medications.                           - Await pathology results. Doristine Locks, MD 11/29/2023 3:38:49 PM

## 2023-11-29 NOTE — Progress Notes (Signed)
 GASTROENTEROLOGY PROCEDURE H&P NOTE   Primary Care Physician: Allwardt, Crist Infante, PA-C    Reason for Procedure:   Diarrhea, change in bowel habits, GERD, hx of colon polyps  Plan:    EGD/colonoscopy  Patient is appropriate for endoscopic procedure(s) in the ambulatory ([facility]) setting.  The nature of the procedure, as well as the risks, benefits, and alternatives were carefully and thoroughly reviewed with the patient. Ample time for discussion and questions allowed. The patient understood, was satisfied, and agreed to proceed.     HPI: Marissa Lee is a 73 y.o. female who presents for EGD and colonoscopy for GERD, hiatal hernia, diarrhea, change in stools, and hx of colon polyps.   Holding Plavix for procedures today.   Endoscopic History: - 08/05/2013: Colonoscopy: 7 mm proximal ascending colon polyp removed with hot snare (path SSA).  Large amount of residual stool.  Normal TI.  Repeat 1 year  - 10/06/2014: Colonoscopy (with 5-day prep): Two 7 mm rectosigmoid hyperplastic polyps removed with hot snare, small rectal polyp ablated with snare tip.  Normal TI.  Recommended repeat 5 years  - 06/04/2018: EGD: Normal esophagus, small hiatal hernia, 5 mm duodenal bulb polyp (benign hyperplasia)   Past Medical History:  Diagnosis Date   Anxiety    CAD (coronary artery disease), autologous vein bypass graft    Endometriosis    Fibrocystic breast    muliple drainage   Fibroid    GERD (gastroesophageal reflux disease)    Hiatal hernia    History of bronchitis    Hypercholesteremia 2007   currently not taking anything for it   Melanoma of toe, 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/2022   Osteopenia 08/2016   hip and spine   Postmenopausal state 06/05/2013   Subclinical hypothyroidism 10/04/2018   Urge incontinence 06/05/2013    Past Surgical History:  Procedure Laterality Date   ABDOMINAL HYSTERECTOMY     BLADDER SUSPENSION  2004   done with [facility]   BREAST BIOPSY  1980   done 3x   CLOSED REDUCTION  METACARPAL WITH PERCUTANEOUS PINNING Right 08/24/2016   Procedure: CLOSED REDUCTION  WITH PERCUTANEOUS PINNING;  Surgeon: Bradly Bienenstock, MD;  Location: [facility] OR;  Service: Orthopedics;  Laterality: Right;   COLONOSCOPY     COMBINED HYSTERECTOMY VAGINAL / OOPHORECTOMY / A&P REPAIR  2004   CORONARY ARTERY BYPASS GRAFT N/A 07/28/2021   Procedure: CORONARY ARTERY BYPASS GRAFTING (CABG) times two using left internal mammary artery and  left leg saphenous vein;  Surgeon: Loreli Slot, MD;  Location: [facility] OR;  Service: Open Heart Surgery;  Laterality: N/A;   FOOT SURGERY Right 1992   IABP INSERTION Right 07/28/2021   Procedure: IABP Insertion;  Surgeon: Marykay Lex, MD;  Location: [facility] INVASIVE CV LAB;  Service: Cardiovascular;  Laterality: Right;   LEFT HEART CATH AND CORONARY ANGIOGRAPHY N/A 07/28/2021   Procedure: LEFT HEART CATH AND CORONARY ANGIOGRAPHY;  Surgeon: Marykay Lex, MD;  Location: [facility] INVASIVE CV LAB;  Service: Cardiovascular;  Laterality: N/A;   OPEN REDUCTION INTERNAL FIXATION (ORIF) HAND Right 08/24/2016   Procedure: RIGHT HAND LONG RING AND SMALL FINGER  POSSIBLE ORIF;  Surgeon: Bradly Bienenstock, MD;  Location: [facility] OR;  Service: Orthopedics;  Laterality: Right;   WISDOM TOOTH EXTRACTION  1978    Prior to Admission medications   Medication Sig Start Date End Date Taking? Authorizing Provider  Apoaequorin (PREVAGEN PO) Take by mouth daily at 2 am.   Yes [provider]  atorvastatin (LIPITOR) 40  MG tablet TAKE 1 TABLET(40 MG) BY MOUTH DAILY 02/22/23  Yes Yates Decamp, MD  FLUoxetine (PROZAC) 10 MG capsule TAKE 1 CAPSULE BY MOUTH EVERY DAY 09/18/23  Yes Allwardt, Alyssa M, PA-C  FLUoxetine (PROZAC) 40 MG capsule TAKE 1 CAPSULE BY MOUTH EVERY DAY 10/20/22  Yes Allwardt, Alyssa M, PA-C  isosorbide mononitrate (IMDUR) 60 MG 24 hr tablet Take 1 tablet (60 mg total) by mouth daily. 10/18/23  Yes Jake Bathe, MD  metoprolol succinate (TOPROL-XL) 25 MG 24 hr tablet Take 0.5 tablets  (12.5 mg total) by mouth daily. 07/04/23  Yes Yates Decamp, MD  pantoprazole (PROTONIX) 40 MG tablet TAKE 1 TABLET(40 MG) BY MOUTH EVERY DAY 12/14/22  Yes Yates Decamp, MD  Calcium-Phosphorus-Vitamin D (CITRACAL +D3 PO) Take 1 tablet by mouth daily.    [provider]  clopidogrel (PLAVIX) 75 MG tablet TAKE 1 TABLET(75 MG) BY MOUTH DAILY 02/22/23   Yates Decamp, MD  Dextrose, Diabetic Use, (GLUCOSE PO) Take 1 tablet by mouth daily as needed (hypoglycemic episodes).    [provider]  nitroGLYCERIN (NITROSTAT) 0.4 MG SL tablet Place 1 tablet (0.4 mg total) under the tongue every 5 (five) minutes as needed for up to 25 days for chest pain. 12/14/22 05/03/23  Yates Decamp, MD    Current Outpatient Medications  Medication Sig Dispense Refill   Apoaequorin (PREVAGEN PO) Take by mouth daily at 2 am.     atorvastatin (LIPITOR) 40 MG tablet TAKE 1 TABLET(40 MG) BY MOUTH DAILY 90 tablet 3   FLUoxetine (PROZAC) 10 MG capsule TAKE 1 CAPSULE BY MOUTH EVERY DAY 90 capsule 0   FLUoxetine (PROZAC) 40 MG capsule TAKE 1 CAPSULE BY MOUTH EVERY DAY 90 capsule 3   isosorbide mononitrate (IMDUR) 60 MG 24 hr tablet Take 1 tablet (60 mg total) by mouth daily. 90 tablet 3   metoprolol succinate (TOPROL-XL) 25 MG 24 hr tablet Take 0.5 tablets (12.5 mg total) by mouth daily. 45 tablet 1   pantoprazole (PROTONIX) 40 MG tablet TAKE 1 TABLET(40 MG) BY MOUTH EVERY DAY 90 tablet 2   Calcium-Phosphorus-Vitamin D (CITRACAL +D3 PO) Take 1 tablet by mouth daily.     clopidogrel (PLAVIX) 75 MG tablet TAKE 1 TABLET(75 MG) BY MOUTH DAILY 90 tablet 3   Dextrose, Diabetic Use, (GLUCOSE PO) Take 1 tablet by mouth daily as needed (hypoglycemic episodes).     nitroGLYCERIN (NITROSTAT) 0.4 MG SL tablet Place 1 tablet (0.4 mg total) under the tongue every 5 (five) minutes as needed for up to 25 days for chest pain. 25 tablet 3   Current Facility-Administered Medications  Medication Dose Route Frequency Provider Last Rate Last Admin    0.9 %  sodium chloride infusion  500 mL Intravenous Continuous Aislin Onofre V, DO       dextrose 5 % solution   Intravenous Continuous Dollie Bressi V, DO        Allergies as of 11/29/2023 - Review Complete 11/29/2023  Allergen Reaction Noted   Cephalosporins Anaphylaxis and Swelling 08/05/2012   Clindamycin/lincomycin  10/30/2019   Codeine Itching 12/22/2014   Lisinopril Cough 12/13/2021   Diflucan [fluconazole] Rash 03/07/2023   Promethazine Rash 03/07/2023    Family History  Problem Relation Age of Onset   Heart disease Mother        valvular disease, valve replacement   Atrial fibrillation Mother    Stroke Mother    Heart disease Brother        valve disease, valve surgery  Thyroid disease Maternal Grandmother        Goiter   Colon cancer Paternal Grandmother    Cancer Paternal Grandmother        Colon    Cancer Paternal Grandfather        Lung   Thyroid disease Daughter        Hypothyroid   Esophageal cancer Neg Hx    Liver disease Neg Hx    Rectal cancer Neg Hx    Stomach cancer Neg Hx     Social History   Socioeconomic History   Marital status: Single    Spouse name: Not on file   Number of children: 2   Years of education: Not on file   Highest education level: Not on file  Occupational History   Occupation: retire  Tobacco Use   Smoking status: Former    Current packs/day: 0.00    Average packs/day: 1 pack/day for 20.0 years (20.0 ttl pk-yrs)    Types: Cigarettes    Start date: 57    Quit date: 2000    Years since quitting: 25.1   Smokeless tobacco: Never   Tobacco comments:    quit in approximately 2000  Vaping Use   Vaping status: Never Used  Substance and Sexual Activity   Alcohol use: Yes    Alcohol/week: 3.0 - 4.0 standard drinks of alcohol    Types: 2 Cans of beer, 1 - 2 Standard drinks or equivalent per week    Comment: rarely   Drug use: No   Sexual activity: Never    Partners: Male    Birth control/protection: Surgical     Comment: Hyst  Other Topics Concern   Not on file  Social History Narrative   Not on file   Social Drivers of Health   Financial Resource Strain: Not on file  Food Insecurity: Not on file  Transportation Needs: Not on file  Physical Activity: Not on file  Stress: Not on file  Social Connections: Not on file  Intimate Partner Violence: Not on file    Physical Exam: Vital signs in last 24 hours: @BP  128/81   Pulse 72   Temp 97.8 F (36.6 C)   Ht 5\' 4"  (1.626 m)   Wt 138 lb (62.6 kg)   LMP 09/27/2003   SpO2 98%   BMI 23.69 kg/m  GEN: NAD EYE: Sclerae anicteric ENT: MMM CV: Non-tachycardic Pulm: CTA b/l GI: Soft, NT/ND NEURO:  Alert & Oriented x 3   Doristine Locks, DO [facility]   11/29/2023 2:44 PM

## 2023-11-29 NOTE — Progress Notes (Signed)
 Report given to PACU, vss

## 2023-11-29 NOTE — Patient Instructions (Signed)
 Please read handouts provided. Continue present medications. Resume Plavix tomorrow at prior dose. Await pathology results. Return to GI office as needed.  YOU HAD AN ENDOSCOPIC PROCEDURE TODAY AT THE [facility]:   Refer to the procedure report that was given to you for any specific questions about what was found during the examination.  If the procedure report does not answer your questions, please call your gastroenterologist to clarify.  If you requested that your care partner not be given the details of your procedure findings, then the procedure report has been included in a sealed envelope for you to review at your convenience later.  YOU SHOULD EXPECT: Some feelings of bloating in the abdomen. Passage of more gas than usual.  Walking can help get rid of the air that was put into your GI tract during the procedure and reduce the bloating. If you had a lower endoscopy (such as a colonoscopy or flexible sigmoidoscopy) you may notice spotting of blood in your stool or on the toilet paper. If you underwent a bowel prep for your procedure, you may not have a normal bowel movement for a few days.  Please Note:  You might notice some irritation and congestion in your nose or some drainage.  This is from the oxygen used during your procedure.  There is no need for concern and it should clear up in a day or so.  SYMPTOMS TO REPORT IMMEDIATELY:  Following lower endoscopy (colonoscopy or flexible sigmoidoscopy):  Excessive amounts of blood in the stool  Significant tenderness or worsening of abdominal pains  Swelling of the abdomen that is new, acute  Fever of 100F or higher  Following upper endoscopy (EGD)  Vomiting of blood or coffee ground material  New chest pain or pain under the shoulder blades  Painful or persistently difficult swallowing  New shortness of breath  Fever of 100F or higher  Black, tarry-looking stools  For urgent or emergent issues, a gastroenterologist  can be reached at any hour by calling [phone]. Do not use MyChart messaging for urgent concerns.    DIET:  We do recommend a small meal at first, but then you may proceed to your regular diet.  Drink plenty of fluids but you should avoid alcoholic beverages for 24 hours.  ACTIVITY:  You should plan to take it easy for the rest of today and you should NOT DRIVE or use heavy machinery until tomorrow (because of the sedation medicines used during the test).    FOLLOW UP: Our staff will call the number listed on your records the next business day following your procedure.  We will call around 7:15- 8:00 am to check on you and address any questions or concerns that you may have regarding the information given to you following your procedure. If we do not reach you, we will leave a message.     If any biopsies were taken you will be contacted by phone or by letter within the next 1-3 weeks.  Please call us at [phone] if you have not heard about the biopsies in 3 weeks.    SIGNATURES/CONFIDENTIALITY: You and/or your care partner have signed paperwork which will be entered into your electronic medical record.  These signatures attest to the fact that that the information above on your After Visit Summary has been reviewed and is understood.  Full responsibility of the confidentiality of this discharge information lies with you and/or your care-partner.

## 2023-11-29 NOTE — Op Note (Signed)
 [facility] Patient Name: Marissa Lee Procedure Date: 11/29/2023 2:35 PM MRN: [id number] Endoscopist: Doristine Locks , MD, [id number] Age: 73 Referring MD:  Date of Birth: [birth year] Gender: Female Account #: [id number] Procedure:                Colonoscopy Indications:              High risk colon cancer surveillance: Personal                            history of colonic polyps                           Additionally, has been having loose stools/diarrhea. Medicines:                Monitored Anesthesia Care Procedure:                Pre-Anesthesia Assessment:                           - Prior to the procedure, a History and Physical                            was performed, and patient medications and                            allergies were reviewed. The patient's tolerance of                            previous anesthesia was also reviewed. The risks                            and benefits of the procedure and the sedation                            options and risks were discussed with the patient.                            All questions were answered, and informed consent                            was obtained. Prior Anticoagulants: The patient has                            taken no anticoagulant or antiplatelet agents. ASA                            Grade Assessment: II - A patient with mild systemic                            disease. After reviewing the risks and benefits,                            the patient was deemed in satisfactory condition to  undergo the procedure.                           After obtaining informed consent, the colonoscope                            was passed under direct vision. Throughout the                            procedure, the patient's blood pressure, pulse, and                            oxygen saturations were monitored continuously. The                            CF [id number] #[id number] was introduced  through the anus                            and advanced to the the terminal ileum. The                            colonoscopy was performed without difficulty. The                            patient tolerated the procedure well. The quality                            of the bowel preparation was good. The terminal                            ileum, ileocecal valve, appendiceal orifice, and                            rectum were photographed. Scope In: 3:03:21 PM Scope Out: 3:20:52 PM Scope Withdrawal Time: 0 hours 14 minutes 19 seconds  Total Procedure Duration: 0 hours 17 minutes 31 seconds  Findings:                 The perianal and digital rectal examinations were                            normal.                           Three sessile polyps were found in the transverse                            colon (1) and ascending colon (2). The polyps were                            2 to 4 mm in size. These polyps were removed with a                            cold snare. Resection and retrieval were complete.  Estimated blood loss was minimal.                           A 6 mm polyp was found in the descending colon. The                            polyp was sessile. The polyp was removed with a                            cold snare. Resection and retrieval were complete.                            Estimated blood loss was minimal.                           Normal mucosa was found in the entire colon.                            Biopsies for histology were taken with a cold                            forceps from the right colon and left colon for                            evaluation of microscopic colitis. Estimated blood                            loss was minimal.                           The retroflexed view of the distal rectum and anal                            verge was normal and showed no anal or rectal                            abnormalities.                            The terminal ileum appeared normal. Complications:            No immediate complications. Estimated Blood Loss:     Estimated blood loss was minimal. Impression:               - Three 2 to 4 mm polyps in the transverse colon                            and in the ascending colon, removed with a cold                            snare. Resected and retrieved.                           - One 6 mm polyp in the descending colon, removed  with a cold snare. Resected and retrieved.                           - Normal mucosa in the entire examined colon.                            Biopsied.                           - The distal rectum and anal verge are normal on                            retroflexion view.                           - The examined portion of the ileum was normal. Recommendation:           - Patient has a contact number available for                            emergencies. The signs and symptoms of potential                            delayed complications were discussed with the                            patient. Return to normal activities tomorrow.                            Written discharge instructions were provided to the                            patient.                           - Resume previous diet.                           - Continue present medications.                           - Await pathology results.                           - Repeat colonoscopy for surveillance based on                            pathology results.                           - Return to GI office PRN. Doristine Locks, MD 11/29/2023 3:44:22 PM

## 2023-11-30 ENCOUNTER — Telehealth: Payer: Self-pay

## 2023-11-30 NOTE — Telephone Encounter (Signed)
  Follow up Call-     11/29/2023    2:01 PM  Call back number  Post procedure Call Back phone  # [phone]  Permission to leave phone message Yes     Patient questions:  Do you have a fever, pain , or abdominal swelling? No. Pain Score  0 *  Have you tolerated food without any problems? Yes.    Have you been able to return to your normal activities? Yes.    Do you have any questions about your discharge instructions: Diet   No. Medications  No. Follow up visit  No.  Do you have questions or concerns about your Care? No.  Actions: * If pain score is 4 or above: No action needed, pain <4.  Patient states that she feels extra thirsty. Patient is tolerating liquids. Encouraged her to drink plenty of fluids today.

## 2023-12-04 LAB — SURGICAL PATHOLOGY

## 2023-12-05 ENCOUNTER — Encounter: Payer: Self-pay | Admitting: Gastroenterology

## 2023-12-06 ENCOUNTER — Ambulatory Visit: Admitting: Family

## 2023-12-06 VITALS — BP 135/73 | HR 60 | Temp 97.3°F | Ht 64.0 in | Wt 140.2 lb

## 2023-12-06 DIAGNOSIS — H938X3 Other specified disorders of ear, bilateral: Secondary | ICD-10-CM

## 2023-12-06 DIAGNOSIS — H7393 Unspecified disorder of tympanic membrane, bilateral: Secondary | ICD-10-CM | POA: Diagnosis not present

## 2023-12-06 NOTE — Progress Notes (Signed)
 Patient ID: Marissa Lee, female    DOB: January 03, 1951, 73 y.o.   MRN: [id number]  Chief Complaint  Patient presents with   Ear Fullness    Pt c/o fullness in bilateral ears, Pt denies any pain. Present for about a year, slight loss of hearing.     Discussed the use of AI scribe software for clinical note transcription with the patient, who gave verbal consent to proceed.  History of Present Illness   The patient presents with a long-standing sensation of ear fullness. She describes the sensation as 'like there's something sitting on my chest,' and it has been present since last summer. The patient denies any associated pain but reports a decrease in hearing and a slight imbalance. She denies any recent illnesses but reports some congestion during the winter.  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 Dull, opaque tympanic membranes, no infection noted, no cerumen. - Refer to ENT for evaluation and possible drainage or tube placement. - Advise against using cotton swabs in ear canal. - Instruct to contact office if ENT appointment is not scheduled within 10 days.  Osteopenia - Postmenopausal, requires vitamin D for calcium absorption. Discussed supplementation and exercise. Advised on possible constipation from calcium supplements. - Recommend vitamin D supplementation up to 2000 IU daily. - Advise calcium supplementation [phone] mg daily, split into two doses if constipation, and drink 2 liters water qd. - Encourage weight-bearing exercises such as walking or using a treadmill or elliptical machine.      Subjective:    Outpatient Medications Prior to Visit  Medication Sig Dispense Refill   Apoaequorin (PREVAGEN PO) Take by mouth daily at 2 am.     atorvastatin  (LIPITOR) 40 MG tablet TAKE 1 TABLET(40 MG) BY MOUTH DAILY 90 tablet 3   Calcium-Phosphorus-Vitamin D (CITRACAL +D3 PO) Take 1 tablet by mouth daily.     clopidogrel (PLAVIX) 75 MG tablet TAKE 1 TABLET(75 MG) BY MOUTH DAILY 90 tablet 3   Dextrose, Diabetic Use, (GLUCOSE PO) Take 1 tablet by mouth daily as needed (hypoglycemic episodes).     FLUoxetine (PROZAC) 10 MG capsule TAKE 1 CAPSULE BY MOUTH EVERY DAY 90 capsule 0   FLUoxetine (PROZAC) 40 MG capsule TAKE 1 CAPSULE BY MOUTH EVERY DAY 90 capsule 3   isosorbide mononitrate (IMDUR) 60 MG 24 hr tablet Take 1 tablet (60 mg total) by mouth daily. 90 tablet 3   metoprolol succinate (TOPROL-XL) 25 MG 24 hr tablet Take 0.5 tablets (12.5 mg total) by mouth daily. 45 tablet 1   pantoprazole (PROTONIX) 40 MG tablet TAKE 1 TABLET(40 MG) BY MOUTH EVERY DAY 90 tablet 2   nitroGLYCERIN (NITROSTAT) 0.4 MG SL tablet Place 1 tablet (0.4 mg total) under the tongue every 5 (five) minutes as needed for up to 25 days for chest pain. 25 tablet 3   No facility-administered medications prior to visit.   Past Medical History:  Diagnosis Date   Anxiety    CAD (coronary artery disease), autologous vein bypass graft    Endometriosis    Fibrocystic breast    muliple drainage   Fibroid    GERD (gastroesophageal reflux disease)    Hiatal hernia    History of bronchitis    Hypercholesteremia 2007   currently not  taking anything for it   Melanoma of toe, 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/2022   Osteopenia 08/2016   hip and spine   Postmenopausal state 06/05/2013   Subclinical hypothyroidism 10/04/2018   Urge incontinence 06/05/2013   Past Surgical History:  Procedure Laterality Date   ABDOMINAL HYSTERECTOMY     BLADDER SUSPENSION  2004   done with [facility]   BREAST BIOPSY  1980   done 3x   CLOSED REDUCTION METACARPAL WITH PERCUTANEOUS PINNING Right 08/24/2016   Procedure: CLOSED REDUCTION  WITH PERCUTANEOUS PINNING;  Surgeon: Bradly Bienenstock, MD;  Location: [facility] OR;  Service:  Orthopedics;  Laterality: Right;   COLONOSCOPY     COMBINED HYSTERECTOMY VAGINAL / OOPHORECTOMY / A&P REPAIR  2004   CORONARY ARTERY BYPASS GRAFT N/A 07/28/2021   Procedure: CORONARY ARTERY BYPASS GRAFTING (CABG) times two using left internal mammary artery and  left leg saphenous vein;  Surgeon: Loreli Slot, MD;  Location: [facility] OR;  Service: Open Heart Surgery;  Laterality: N/A;   FOOT SURGERY Right 1992   IABP INSERTION Right 07/28/2021   Procedure: IABP Insertion;  Surgeon: Marykay Lex, MD;  Location: [facility] INVASIVE CV LAB;  Service: Cardiovascular;  Laterality: Right;   LEFT HEART CATH AND CORONARY ANGIOGRAPHY N/A 07/28/2021   Procedure: LEFT HEART CATH AND CORONARY ANGIOGRAPHY;  Surgeon: Marykay Lex, MD;  Location: [facility] INVASIVE CV LAB;  Service: Cardiovascular;  Laterality: N/A;   OPEN REDUCTION INTERNAL FIXATION (ORIF) HAND Right 08/24/2016   Procedure: RIGHT HAND LONG RING AND SMALL FINGER  POSSIBLE ORIF;  Surgeon: Bradly Bienenstock, MD;  Location: [facility] OR;  Service: Orthopedics;  Laterality: Right;   WISDOM TOOTH EXTRACTION  1978   Allergies  Allergen Reactions   Cephalosporins Anaphylaxis and Swelling    Throat swells   Clindamycin/Lincomycin     Metallic taste, blisters behind ears   Codeine Itching   Lisinopril Cough   Diflucan [Fluconazole] Rash   Promethazine Rash      Objective:    Physical Exam Vitals and nursing note reviewed.  Constitutional:      Appearance: Normal appearance.  HENT:     Right Ear: Decreased hearing noted. No drainage or tenderness. A middle ear effusion is present. There is no impacted cerumen. Tympanic membrane is not injected, perforated, erythematous, retracted or bulging.     Left Ear: Decreased hearing noted. No drainage or tenderness. A middle ear effusion is present. There is no impacted cerumen. Tympanic membrane is not injected, perforated, erythematous, retracted or bulging.  Cardiovascular:     Rate and Rhythm: Normal rate and  regular rhythm.  Pulmonary:     Effort: Pulmonary effort is normal.     Breath sounds: Normal breath sounds.  Musculoskeletal:        General: Normal range of motion.  Skin:    General: Skin is warm and dry.  Neurological:     Mental Status: She is alert.  Psychiatric:        Mood and Affect: Mood normal.        Behavior: Behavior normal.    BP 135/73 (BP Location: Left Arm, Patient Position: Sitting, Cuff Size: Normal)   Pulse 60   Temp (!) 97.3 F (36.3 C) (Temporal)   Ht 5\' 4"  (1.626 m)   Wt 140 lb 3.2 oz (63.6 kg)   LMP 09/27/2003   SpO2 99%   BMI 24.07 kg/m  Wt Readings from Last 3 Encounters:  12/06/23 140 lb 3.2 oz (63.6 kg)  11/29/23 138  lb (62.6 kg)  10/26/23 138 lb (62.6 kg)      Dulce Sellar, NP

## 2023-12-13 ENCOUNTER — Other Ambulatory Visit: Payer: Self-pay | Admitting: Physician Assistant

## 2024-01-04 ENCOUNTER — Other Ambulatory Visit: Payer: Self-pay | Admitting: Cardiology

## 2024-01-16 ENCOUNTER — Telehealth: Payer: Self-pay | Admitting: Cardiology

## 2024-01-16 NOTE — Telephone Encounter (Signed)
 Pt c/o medication issue:  1. Name of Medication:  isosorbide  mononitrate (IMDUR ) 60 MG 24 hr tablet  2. How are you currently taking this medication (dosage and times per day)?   3. Are you having a reaction (difficulty breathing--STAT)?   4. What is your medication issue?   Patient says she's out of the 60 MG tablets and would like to know if she can start back taking 2 of her 30 MG tablets. She says she has at least 60 or 90 tablets remaining.

## 2024-01-16 NOTE — Telephone Encounter (Signed)
 Spoke with pt regarding her Imdur . Pt wanted to know if she could take 2 of her 30 mg tablets to make 60 mg which she is supposed to take once daily. Pt was told this is ok to do. Pt verbalized understanding. All questions if any were answered.

## 2024-02-08 ENCOUNTER — Other Ambulatory Visit: Payer: Self-pay | Admitting: Cardiology

## 2024-02-08 DIAGNOSIS — K219 Gastro-esophageal reflux disease without esophagitis: Secondary | ICD-10-CM

## 2024-03-01 ENCOUNTER — Ambulatory Visit (INDEPENDENT_AMBULATORY_CARE_PROVIDER_SITE_OTHER): Admitting: Otolaryngology

## 2024-03-01 ENCOUNTER — Encounter (INDEPENDENT_AMBULATORY_CARE_PROVIDER_SITE_OTHER): Payer: Self-pay | Admitting: Otolaryngology

## 2024-03-01 VITALS — BP 113/68 | HR 69 | Ht 64.0 in | Wt 140.0 lb

## 2024-03-01 DIAGNOSIS — H9190 Unspecified hearing loss, unspecified ear: Secondary | ICD-10-CM

## 2024-03-01 DIAGNOSIS — H938X3 Other specified disorders of ear, bilateral: Secondary | ICD-10-CM | POA: Diagnosis not present

## 2024-03-01 DIAGNOSIS — H6993 Unspecified Eustachian tube disorder, bilateral: Secondary | ICD-10-CM | POA: Diagnosis not present

## 2024-03-01 MED ORDER — FLUTICASONE PROPIONATE 50 MCG/ACT NA SUSP: 2.0000 | Freq: Two times a day (BID) | NASAL | 6 refills | Status: DC

## 2024-03-01 MED ORDER — AZELASTINE HCL 0.1 % NA SOLN: 2.0000 | Freq: Two times a day (BID) | NASAL | 12 refills | Status: DC

## 2024-03-01 NOTE — Patient Instructions (Signed)
 Use two sprays of flonase  in each nostril twice per day  right after, use astelin spray two sprays each nostril twice per day  Pop your ears multiple times per day

## 2024-03-01 NOTE — Progress Notes (Signed)
 Dear Dr. Alray Askew, Here is my assessment for our mutual patient, Marissa Lee. Thank you for allowing me the opportunity to care for your patient. Please do not hesitate to contact me should you have any other questions. Sincerely, Dr. Milon Aloe  Otolaryngology Clinic Note Referring provider: Dr. Alray Askew HPI:  Marissa Lee is a 73 y.o. female kindly referred by Dr. Alray Askew for evaluation of ear fullness (bilateral) and hearing loss.  Initial visit (02/2024): Patient reports: noted bilateral ear fullness ongoing for a year. Feels like there is water in my ear - progressively getting worse. She also notes that her hearing is muffled -- bilaterally. Denies antecedent event including any URI or sickness. Noted URI/congestion bilaterally from chart review with cough in May 2025. Denies frequent sinus infections. Does have seasonal allergies - nothing ever been diagnosed -- does not use any nasal sprays. Have not tried anything for this. Nothing makes it better or worse. Patient denies: ear pain, vertigo, drainage, tinnitus Patient additionally denies: deep pain in ear canal, eustachian tube symptoms such as popping/crackling, sensitive to pressure changes Patient also denies barotrauma, vestibular suppressant use, ototoxic medication use Prior ear surgery: no  H&N Surgery: no Personal or FHx of bleeding dz or anesthesia difficulty: no  GLP-1: no AP/AC: Plavix   Tobacco: quit (20 year use)  PMHx: CAD s/p CABG, HLD,   Independent Review of Additional Tests or Records:  Trevor Fudge Hudnell (12/06/2023): bilateral ear fullness, present since last summer; decrease in hearing; Dx: ETD; Rx: ref t oENT Alyssa Allwardt (5/230/2024): noted productive cough, fatigue; presumed viral illness; supportive care Labs: CBC and CMP 06/20/2023: WBC 5.8, Eos 100, BUN/Cr wnl  PMH/Meds/All/SocHx/FamHx/ROS:   Past Medical History:  Diagnosis Date   Anxiety    CAD (coronary artery disease), autologous vein  bypass graft    Endometriosis    Fibrocystic breast    muliple drainage   Fibroid    GERD (gastroesophageal reflux disease)    Hiatal hernia    History of bronchitis    Hypercholesteremia 2007   currently not taking anything for it   Melanoma of toe, 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/2022   Osteopenia 08/2016   hip and spine   Postmenopausal state 06/05/2013   Subclinical hypothyroidism 10/04/2018   Urge incontinence 06/05/2013     Past Surgical History:  Procedure Laterality Date   ABDOMINAL HYSTERECTOMY     BLADDER SUSPENSION  2004   done with [facility]   BREAST BIOPSY  1980   done 3x   CLOSED REDUCTION METACARPAL WITH PERCUTANEOUS PINNING Right 08/24/2016   Procedure: CLOSED REDUCTION  WITH PERCUTANEOUS PINNING;  Surgeon: Arvil Birks, MD;  Location: [facility] OR;  Service: Orthopedics;  Laterality: Right;   COLONOSCOPY     COMBINED HYSTERECTOMY VAGINAL / OOPHORECTOMY / A&P REPAIR  2004   CORONARY ARTERY BYPASS GRAFT N/A 07/28/2021   Procedure: CORONARY ARTERY BYPASS GRAFTING (CABG) times two using left internal mammary artery and  left leg saphenous vein;  Surgeon: Zelphia Higashi, MD;  Location: [facility] OR;  Service: Open Heart Surgery;  Laterality: N/A;   FOOT SURGERY Right 1992   IABP INSERTION Right 07/28/2021   Procedure: IABP Insertion;  Surgeon: Arleen Lacer, MD;  Location: [facility] INVASIVE CV LAB;  Service: Cardiovascular;  Laterality: Right;   LEFT HEART CATH AND CORONARY ANGIOGRAPHY N/A 07/28/2021   Procedure: LEFT HEART CATH AND CORONARY ANGIOGRAPHY;  Surgeon: Arleen Lacer, MD;  Location: [facility] INVASIVE CV LAB;  Service: Cardiovascular;  Laterality: N/A;   OPEN REDUCTION INTERNAL  FIXATION (ORIF) HAND Right 08/24/2016   Procedure: RIGHT HAND LONG RING AND SMALL FINGER  POSSIBLE ORIF;  Surgeon: Arvil Birks, MD;  Location: [facility] OR;  Service: Orthopedics;  Laterality: Right;   WISDOM TOOTH EXTRACTION  1978    Family History  Problem Relation Age of Onset   Heart disease Mother        valvular  disease, valve replacement   Atrial fibrillation Mother    Stroke Mother    Heart disease Brother        valve disease, valve surgery   Thyroid  disease Maternal Grandmother        Goiter   Colon cancer Paternal Grandmother    Cancer Paternal Grandmother        Colon    Cancer Paternal Grandfather        Lung   Thyroid  disease Daughter        Hypothyroid   Esophageal cancer Neg Hx    Liver disease Neg Hx    Rectal cancer Neg Hx    Stomach cancer Neg Hx      Social Connections: Not on file      Current Outpatient Medications:    Apoaequorin (PREVAGEN PO), Take by mouth daily at 2 am., Disp: , Rfl:    atorvastatin  (LIPITOR ) 40 MG tablet, TAKE 1 TABLET(40 MG) BY MOUTH DAILY, Disp: 90 tablet, Rfl: 3   azelastine  (ASTELIN ) 0.1 % nasal spray, Place 2 sprays into both nostrils 2 (two) times daily. Use in each nostril as directed, Disp: 30 mL, Rfl: 12   Calcium -Phosphorus-Vitamin D  (CITRACAL +D3 PO), Take 1 tablet by mouth daily., Disp: , Rfl:    clopidogrel  (PLAVIX ) 75 MG tablet, TAKE 1 TABLET(75 MG) BY MOUTH DAILY, Disp: 90 tablet, Rfl: 3   Dextrose , Diabetic Use, (GLUCOSE PO), Take 1 tablet by mouth daily as needed (hypoglycemic episodes)., Disp: , Rfl:    FLUoxetine  (PROZAC ) 10 MG capsule, TAKE 1 CAPSULE BY MOUTH EVERY DAY, Disp: 90 capsule, Rfl: 0   FLUoxetine  (PROZAC ) 40 MG capsule, TAKE 1 CAPSULE BY MOUTH EVERY DAY, Disp: 90 capsule, Rfl: 0   fluticasone  (FLONASE ) 50 MCG/ACT nasal spray, Place 2 sprays into both nostrils in the morning and at bedtime., Disp: 16 g, Rfl: 6   isosorbide  mononitrate (IMDUR ) 60 MG 24 hr tablet, Take 1 tablet (60 mg total) by mouth daily., Disp: 90 tablet, Rfl: 3   metoprolol  succinate (TOPROL -XL) 25 MG 24 hr tablet, TAKE 1/2 TABLET(12.5 MG) BY MOUTH DAILY, Disp: 45 tablet, Rfl: 2   pantoprazole  (PROTONIX ) 40 MG tablet, TAKE 1 TABLET(40 MG) BY MOUTH EVERY DAY, Disp: 90 tablet, Rfl: 3   nitroGLYCERIN  (NITROSTAT ) 0.4 MG SL tablet, Place 1 tablet (0.4 mg  total) under the tongue every 5 (five) minutes as needed for up to 25 days for chest pain., Disp: 25 tablet, Rfl: 3   Physical Exam:   BP 113/68 (BP Location: Right Arm, Patient Position: Sitting, Cuff Size: Normal)   Pulse 69   Ht 5' 4 (1.626 m)   Wt 140 lb (63.5 kg)   LMP 09/27/2003   SpO2 96%   BMI 24.03 kg/m   Salient findings:  CN II-XII intact Given history and complaints, ear microscopy was indicated and performed for evaluation with findings as below in physical exam section and in procedures; Bilateral EAC clear and TM intact - bilateral pars flaccida modest retraction, clean; no effusion noted Weber 512: mid Rinne 512: AC > BC b/l  Anterior rhinoscopy: Septum intact; bilateral  inferior turbinates without significant hypertrophy No lesions of oral cavity/oropharynx No obviously palpable neck masses No respiratory distress or stridor  Seprately Identifiable Procedures:  Prior to initiating any procedures, risks/benefits/alternatives were explained to the patient and verbal consent obtained. Procedure: Bilateral ear microscopy using microscope (CPT [phone]) Pre-procedure diagnosis: bilateral ear fullness, concern for effusion in middle ear Post-procedure diagnosis: same Indication: see above; given patient's otologic complaints and history, for improved and comprehensive examination of external ear and tympanic membrane, bilateral otologic examination using microscope was performed  Procedure: Patient was placed semi-recumbent. Both ear canals were examined using the microscope with findings above. Patient tolerated the procedure well.   Impression & Plans:  Eran Windish is a 73 y.o. female with:  1. Sensation of fullness in both ears   2. Dysfunction of both eustachian tubes   3. 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 We discussed options: medical v/s BTT She did not want pred burst, so will start with flonase   and astelin  BID Autoinsufflate ears.  F/u 6-8 weeks with audio  See below regarding exact medications prescribed this encounter including dosages and route: Meds ordered this encounter  Medications   fluticasone  (FLONASE ) 50 MCG/ACT nasal spray    Sig: Place 2 sprays into both nostrils in the morning and at bedtime.    Dispense:  16 g    Refill:  6   azelastine  (ASTELIN ) 0.1 % nasal spray    Sig: Place 2 sprays into both nostrils 2 (two) times daily. Use in each nostril as directed    Dispense:  30 mL    Refill:  12      Thank you for allowing me the opportunity to care for your patient. Please do not hesitate to contact me should you have any other questions.  Sincerely, Milon Aloe, MD Otolaryngologist (ENT), [facility] Phone: [phone] Fax: [phone]  03/10/2024, 2:58 PM   MDM:  Level 4 - 99204 Complexity/Problems addressed: mod - chronic problem, worsening Data complexity: mod - independent review of notes, labs, ordering test - Morbidity: mod  - Drug prescribed or managed: y

## 2024-03-12 ENCOUNTER — Other Ambulatory Visit: Payer: Self-pay | Admitting: Cardiology

## 2024-03-12 DIAGNOSIS — I2510 Atherosclerotic heart disease of native coronary artery without angina pectoris: Secondary | ICD-10-CM

## 2024-03-13 ENCOUNTER — Emergency Department (HOSPITAL_COMMUNITY)

## 2024-03-13 ENCOUNTER — Encounter (HOSPITAL_COMMUNITY): Payer: Self-pay | Admitting: Emergency Medicine

## 2024-03-13 ENCOUNTER — Telehealth: Payer: Self-pay | Admitting: Cardiology

## 2024-03-13 ENCOUNTER — Other Ambulatory Visit: Payer: Self-pay

## 2024-03-13 ENCOUNTER — Emergency Department (HOSPITAL_COMMUNITY)
Admission: EM | Admit: 2024-03-13 | Discharge: 2024-03-13 | Disposition: A | Discharge status: Home / Self Care | Attending: Emergency Medicine | Admitting: Emergency Medicine

## 2024-03-13 ENCOUNTER — Other Ambulatory Visit: Payer: Self-pay | Admitting: Cardiology

## 2024-03-13 DIAGNOSIS — R202 Paresthesia of skin: Secondary | ICD-10-CM | POA: Insufficient documentation

## 2024-03-13 DIAGNOSIS — R079 Chest pain, unspecified: Secondary | ICD-10-CM | POA: Diagnosis not present

## 2024-03-13 DIAGNOSIS — Z7901 Long term (current) use of anticoagulants: Secondary | ICD-10-CM | POA: Diagnosis not present

## 2024-03-13 DIAGNOSIS — I2510 Atherosclerotic heart disease of native coronary artery without angina pectoris: Secondary | ICD-10-CM | POA: Insufficient documentation

## 2024-03-13 DIAGNOSIS — Z951 Presence of aortocoronary bypass graft: Secondary | ICD-10-CM | POA: Diagnosis not present

## 2024-03-13 LAB — CBC
HCT: 41.2 % (ref 36.0–46.0)
Hemoglobin: 13.2 g/dL (ref 12.0–15.0)
MCH: 31.3 pg (ref 26.0–34.0)
MCHC: 32 g/dL (ref 30.0–36.0)
MCV: 97.6 fL (ref 80.0–100.0)
Platelets: 291 10*3/uL (ref 150–400)
RBC: 4.22 MIL/uL (ref 3.87–5.11)
RDW: 13.1 % (ref 11.5–15.5)
WBC: 6.3 10*3/uL (ref 4.0–10.5)
nRBC: 0 % (ref 0.0–0.2)

## 2024-03-13 LAB — BASIC METABOLIC PANEL WITH GFR
Anion gap: 6 (ref 5–15)
BUN: 13 mg/dL (ref 8–23)
CO2: 25 mmol/L (ref 22–32)
Calcium: 8.6 mg/dL — ABNORMAL LOW (ref 8.9–10.3)
Chloride: 105 mmol/L (ref 98–111)
Creatinine, Ser: 1.01 mg/dL — ABNORMAL HIGH (ref 0.44–1.00)
GFR, Estimated: 60 mL/min — ABNORMAL LOW (ref >60)
Glucose, Bld: 79 mg/dL (ref 70–99)
Potassium: 3.6 mmol/L (ref 3.5–5.1)
Sodium: 136 mmol/L (ref 135–145)

## 2024-03-13 LAB — CBG MONITORING, ED: Glucose-Capillary: 95 mg/dL (ref 70–99)

## 2024-03-13 LAB — TROPONIN I (HIGH SENSITIVITY)
Troponin I (High Sensitivity): 3 ng/L (ref <18)
Troponin I (High Sensitivity): 4 ng/L (ref <18)

## 2024-03-13 MED ORDER — IOHEXOL 350 MG/ML SOLN
100.0000 mL | Freq: Once | INTRAVENOUS | Status: AC | PRN
  Administered 2024-03-13: 100 mL via INTRAVENOUS

## 2024-03-13 NOTE — Telephone Encounter (Signed)
  Pt c/o of Chest Pain: STAT if active CP, including tightness, pressure, jaw pain, radiating pain to shoulder/upper arm/back, CP unrelieved by Nitro. Symptoms reported of SOB, nausea, vomiting, sweating.  1. Are you having CP right now?   No  2. Are you experiencing any other symptoms (ex. SOB, nausea, vomiting, sweating)?   No  3. Is your CP continuous or coming and going?   Coming and going  4. Have you taken Nitroglycerin ?   No  5. How long have you been experiencing CP?   Started yesterday evening    6. If NO CP at time of call then end call with telling Pt to call back or call 911 if Chest pain returns prior to return call from triage team.   Patient stated she had chest pains late yesterday evening (7-8:00 pm) and was sweating.  Patient stated she rested a bit and the pain went away.

## 2024-03-13 NOTE — ED Notes (Signed)
 Assisted to bedside commode

## 2024-03-13 NOTE — ED Notes (Signed)
Dr. Jeraldine Loots at bedside to assess pt.

## 2024-03-13 NOTE — Telephone Encounter (Signed)
 Patient currently in ED.

## 2024-03-13 NOTE — ED Triage Notes (Signed)
 Patient c/o chest pain that started last night.  Patient states pain is generalized.  Patient denies n/v, endorses sob.  Patient gives verbal consent for MSE.

## 2024-03-13 NOTE — ED Notes (Signed)
 Assisted to bedside commode. Urine sample obtained. Partial linen change done.

## 2024-03-13 NOTE — ED Provider Triage Note (Addendum)
 Emergency Medicine Provider Triage Evaluation Note  Marissa Lee , a 73 y.o. female  was evaluated in triage.  Pt complains of chest pain.  Review of Systems  Positive: CP, mild SOB Negative: Fever, cough  Physical Exam  BP 118/64 (BP Location: Right Arm)   Pulse (!) 52   Temp 97.6 F (36.4 C)   Resp 14   Wt 63.5 kg   LMP 09/27/2003   SpO2 100%   BMI 24.03 kg/m  Gen:   Awake, no distress   Resp:  Normal effort  MSK:   Moves extremities without difficulty  Other:    Medical Decision Making  Medically screening exam initiated at 12:25 PM.  Appropriate orders placed.  Marissa Lee was informed that the remainder of the evaluation will be completed by another provider, this initial triage assessment does not replace that evaluation, and the importance of remaining in the ED until their evaluation is complete.  History CABG 2022. Here with intermittent chest pain since 5 days ago - under a lot of stress, goes away with rest. Reports normal EKG when I had my bypass too.    Mandy Second, PA-C  ADDENDUM: 15:10 - patient reports feeling numb in left arm and left leg, leg gave out. No h/o stroke. Sxs x 30 minutes. No headache, visual change, speech difficulty.   No neuro deficits on exam: No past pointing on finger-to-nose, no pronator drift, equal reflexes, speech clear/focused, CNs 3-12 intact.   Has been seen by Dr. Liam Redhead. Not a code stroke. Dissection study added to work up.  03/13/24 1227    Mandy Second, PA-C 03/13/24 1516

## 2024-03-13 NOTE — Telephone Encounter (Signed)
 Spoke with pt over the phone who stated she has been having CP episodes over the last week. Pt had episode Thursday, Saturday, Monday and yesterday. Took about 45 mins for the CP to go away yesterday. Pt recently refilled her nitroglycerin  but has not taken any. Advised pt that if she has chest discomfort to take a nitroglycerin  tablet and wait 5 mins, if the pain doesn't subside, she needs to take another nitroglycerin  tablet and go to the ED to be evaluated. Explained to pt that if she has any other symptoms occur with the CP episodes such as SOB, N/V, sweating, etc she will need to go to the ED to be evaluated as well. Pt didn't want to go to ED with the previous episodes but stated that her daughter if with her now and if she needs to go, her daughter will take her. Scheduled pt for appt with Dr. Ganji for Monday 03/18/24 in the mean time. Will forward to Dr. Berry Bristol and his nurse to review.

## 2024-03-13 NOTE — ED Notes (Signed)
 The patient told sort tech that her left chest pain was worsening and also reported that her left shoulder, left arm and left leg were tingling and felt weak, even described it as cramping and tingling. The pt was brought back to triage for re-assessment. The pt stated she estimates these symptoms starting around 1430 when she was waiting in the lobby. She attempted to walk it off but states her left leg gave out. This RN asked Clovis Dar, PA to re-assess the patient. Dr. Liam Redhead was also called in to assess for possible code stroke. Dr. Liam Redhead came to evaluate the patient but no code stroke was called, but instead will order a CT dissection study.

## 2024-03-13 NOTE — Discharge Instructions (Signed)
 Thank for let us  evaluate you today.  Your troponin/heart enzymes are negative for heart injury.  Your EKGs per your baseline.  Your chest x-ray did not show pneumonia nor fluid.  Your CT study did not show any aneurysm, dissection, nor pulmonary embolism.  Please make sure to try to reduce strenuous activities and activities outside in the heat.  Please follow-up with cardiology for further management  You do not have any signs of stroke at this time.  Return to Emergency Department if you experience worsening chest pain, shortness of breath, worst headache of your life with a sudden onset, numbness or weakness in one-sided body, slurred speech, altered mentation, seizure-like activity

## 2024-03-13 NOTE — ED Provider Notes (Signed)
 [place] EMERGENCY DEPARTMENT AT [facility] Provider Note   CSN: [id number] Arrival date & time: 03/13/24  1101     Patient presents with: Chest Pain   Marissa Lee is a 73 y.o. female with past medical history of CAD, CABG 2022, HLD, anxiety, angina presents to emergency department for evaluation of multiple episodes of chest pain over the past week.  She relates chest pain is related to times of feeling stressed or in a hot environment.  These episodes of chest pain were midsternal and sharp underneath of her CABG scar.  They last a few minutes and then go away spontaneously following rest.  She called her cardiologist Dr. Victorine Grater who recommended ED evaluation as he did not have a appointment until next week.  While in emergency department, patient got up from sitting for several hours in waiting room when she started feeling left leg cramping at 1400.  Cramping turned into tingling that radiated up into her left arm.  She stated that she felt that her leg was going to give out.  No falls, back pain, urinary symptoms.  She then started feeling shortness of breath and similar chest pain.  Of note, saw cardiology on 09/28/2023 who recently increased isosorbide  from 30 mg to 60 mg and recommended 78-month follow-up. Pharm stress test on 01/05/23 showed normal LVEF of 72% and on ischemia.    Chest Pain     Prior to Admission medications   Medication Sig Start Date End Date Taking? Authorizing Provider  Apoaequorin (PREVAGEN PO) Take by mouth daily at 2 am.    [provider]  atorvastatin  (LIPITOR ) 40 MG tablet TAKE 1 TABLET(40 MG) BY MOUTH DAILY 02/22/23   Knox Perl, MD  azelastine  (ASTELIN ) 0.1 % nasal spray Place 2 sprays into both nostrils 2 (two) times daily. Use in each nostril as directed 03/01/24   Evelina Hippo, MD  Calcium -Phosphorus-Vitamin D  (CITRACAL +D3 PO) Take 1 tablet by mouth daily.    [provider]  clopidogrel  (PLAVIX ) 75 MG tablet TAKE 1  TABLET(75 MG) BY MOUTH DAILY 02/22/23   Knox Perl, MD  Dextrose , Diabetic Use, (GLUCOSE PO) Take 1 tablet by mouth daily as needed (hypoglycemic episodes).    [provider]  FLUoxetine  (PROZAC ) 10 MG capsule TAKE 1 CAPSULE BY MOUTH EVERY DAY 09/18/23   Allwardt, Alyssa M, PA-C  FLUoxetine  (PROZAC ) 40 MG capsule TAKE 1 CAPSULE BY MOUTH EVERY DAY 12/13/23   Allwardt, Alyssa M, PA-C  fluticasone  (FLONASE ) 50 MCG/ACT nasal spray Place 2 sprays into both nostrils in the morning and at bedtime. 03/01/24 04/30/24  Evelina Hippo, MD  isosorbide  mononitrate (IMDUR ) 60 MG 24 hr tablet Take 1 tablet (60 mg total) by mouth daily. 10/18/23   Hugh Madura, MD  metoprolol  succinate (TOPROL -XL) 25 MG 24 hr tablet TAKE 1/2 TABLET(12.5 MG) BY MOUTH DAILY 01/04/24   Knox Perl, MD  nitroGLYCERIN  (NITROSTAT ) 0.4 MG SL tablet PLACE 1 TABLET UNDER THE TONGUE EVERY 5 MINUTES AS NEEDED FOR CHEST PAIN 03/12/24   Knox Perl, MD  pantoprazole  (PROTONIX ) 40 MG tablet TAKE 1 TABLET(40 MG) BY MOUTH EVERY DAY 02/08/24   Knox Perl, MD    Allergies: Cephalosporins, Clindamycin /lincomycin, Codeine, Lisinopril , Diflucan  [fluconazole ], and Promethazine     Review of Systems  Cardiovascular:  Positive for chest pain.    Updated Vital Signs BP (!) 145/66   Pulse (!) 46   Temp 97.7 F (36.5 C) (Oral)   Resp 15   Ht  5' 4 (1.626 m)   Wt 63.5 kg   LMP 09/27/2003   SpO2 100%   BMI 24.03 kg/m   Physical Exam Vitals and nursing note reviewed.  Constitutional:      General: She is not in acute distress.    Appearance: Normal appearance.  HENT:     Head: Normocephalic and atraumatic.   Eyes:     General: Lids are normal. Vision grossly intact. No visual field deficit.    Extraocular Movements:     Right eye: Normal extraocular motion and no nystagmus.     Left eye: Normal extraocular motion and no nystagmus.     Conjunctiva/sclera: Conjunctivae normal.    Cardiovascular:     Rate and Rhythm: Bradycardia  present.     Heart sounds: Heart sounds not distant. No murmur heard.    No gallop.     Comments: Baseline 45-65 bpm per chart review Pulmonary:     Effort: Pulmonary effort is normal. No respiratory distress.   Musculoskeletal:     Cervical back: Normal range of motion and neck supple. No rigidity.     Right lower leg: No edema.     Left lower leg: No edema.     Comments: No pedal edema nor tenderness.  Celine Collard' sign negative x 2   Skin:    Capillary Refill: Capillary refill takes less than 2 seconds.     Coloration: Skin is not jaundiced or pale.   Neurological:     Mental Status: She is alert and oriented to person, place, and time. Mental status is at baseline.     GCS: GCS eye subscore is 4. GCS verbal subscore is 5. GCS motor subscore is 6.     Cranial Nerves: No cranial nerve deficit, dysarthria or facial asymmetry.     Sensory: Sensation is intact. No sensory deficit.     Motor: No weakness, tremor, atrophy, abnormal muscle tone, seizure activity or pronator drift.     Coordination: Coordination normal. Finger-Nose-Finger Test and Heel to Dotsero Test normal.     Gait: Gait is intact. Gait normal.     (all labs ordered are listed, but only abnormal results are displayed) Labs Reviewed  BASIC METABOLIC PANEL WITH GFR - Abnormal; Notable for the following components:      Result Value   Creatinine, Ser 1.01 (*)    Calcium  8.6 (*)    GFR, Estimated 60 (*)    All other components within normal limits  CBC  CBG MONITORING, ED  TROPONIN I (HIGH SENSITIVITY)  TROPONIN I (HIGH SENSITIVITY)    EKG: EKG Interpretation Date/Time:  Wednesday March 13 2024 15:00:45 EDT Ventricular Rate:  50 PR Interval:  176 QRS Duration:  80 QT Interval:  470 QTC Calculation: 428 R Axis:   19  Text Interpretation: Sinus bradycardia Otherwise normal ECG When compared with ECG of 13-Mar-2024 11:34, No significant change since last tracing Confirmed by Racheal Buddle [phone]) on 03/13/2024  3:26:46 PM  Radiology: CT Angio Chest/Abd/Pel for Dissection W and/or Wo Contrast Result Date: 03/13/2024 CLINICAL DATA:  Acute aortic syndrome (AAS) suspected.  Chest pain EXAM: CT ANGIOGRAPHY CHEST, ABDOMEN AND PELVIS TECHNIQUE: Non-contrast CT of the chest was initially obtained. Multidetector CT imaging through the chest, abdomen and pelvis was performed using the standard protocol during bolus administration of intravenous contrast. Multiplanar reconstructed images and MIPs were obtained and reviewed to evaluate the vascular anatomy. RADIATION DOSE REDUCTION: This exam was performed according to the departmental dose-optimization program which  includes automated exposure control, adjustment of the mA and/or kV according to patient size and/or use of iterative reconstruction technique. CONTRAST:  OMNIPAQUE  IOHEXOL  350 MG/ML SOLN COMPARISON:  05/23/2018 FINDINGS: CTA CHEST FINDINGS Cardiovascular: Heart is normal size. Aorta is normal caliber. Prior CABG. Scattered aortic atherosclerosis. No filling defects in the pulmonary arteries to suggest pulmonary emboli. Mediastinum/Nodes: No mediastinal, hilar, or axillary adenopathy. Trachea and esophagus are unremarkable. Thyroid  unremarkable. Moderate to large hiatal hernia, increasing in size since prior study. Lungs/Pleura: No confluent airspace opacities or effusions. Musculoskeletal: Chest wall soft tissues are unremarkable. No acute bony abnormality. Review of the MIP images confirms the above findings. CTA ABDOMEN AND PELVIS FINDINGS VASCULAR Aorta: Normal caliber aorta without aneurysm, dissection, vasculitis or significant stenosis. Moderate calcified and noncalcified infrarenal plaque. Celiac: Tight narrowing at the origin of the celiac axis, likely related to median arcuate ligament impression as there is no calcified plaque in the area. No dissection. SMA: Patent without evidence of aneurysm, dissection, vasculitis or significant stenosis. Renals:  Both renal arteries are patent without evidence of aneurysm, dissection, vasculitis, fibromuscular dysplasia or significant stenosis. IMA: Patent without evidence of aneurysm, dissection, vasculitis or significant stenosis. Inflow: Patent without evidence of aneurysm, dissection, vasculitis or significant stenosis. Veins: No obvious venous abnormality within the limitations of this arterial phase study. Review of the MIP images confirms the above findings. NON-VASCULAR Hepatobiliary: No focal hepatic abnormality. Gallbladder unremarkable. Pancreas: No focal abnormality or ductal dilatation. Spleen: No focal abnormality.  Normal size. Adrenals/Urinary Tract: No adrenal abnormality. No focal renal abnormality. No stones or hydronephrosis. Urinary bladder is unremarkable. Stomach/Bowel: Normal appendix. Stomach, large and small bowel grossly unremarkable. Lymphatic: No adenopathy Reproductive: Prior hysterectomy.  No adnexal masses. Other: No free fluid or free air. Musculoskeletal: No acute bony abnormality. Review of the MIP images confirms the above findings. IMPRESSION: No evidence of aortic aneurysm or dissection. Prior CABG. Moderate infrarenal atherosclerosis. No evidence of pulmonary embolus. Moderate to large hiatal hernia. No acute findings in the chest, abdomen or pelvis. Electronically Signed   By: Janeece Mechanic M.D.   On: 03/13/2024 16:41   DG Chest 2 View Result Date: 03/13/2024 CLINICAL DATA:  Chest pain EXAM: CHEST - 2 VIEW COMPARISON:  Chest x-ray 02/23/2023 and older FINDINGS: Hyperinflation. Sternal wires. Normal cardiopericardial silhouette without edema. No consolidation, pneumothorax or effusion. Hiatal hernia identified. IMPRESSION: Postop chest.  Hyperinflation.  Hiatal hernia Electronically Signed   By: Adrianna Horde M.D.   On: 03/13/2024 13:39     Medications Ordered in the ED  iohexol  (OMNIPAQUE ) 350 MG/ML injection 100 mL (100 mLs Intravenous Contrast Given 03/13/24 1615)                                     Medical Decision Making Amount and/or Complexity of Data Reviewed Labs: ordered. Radiology: ordered.   Patient presents to the ED for concern of CP, shortness of breath, this involves an extensive number of treatment options, and is a complaint that carries with it a high risk of complications and morbidity.  The differential diagnosis includes ACS, pneumonia, infection, fluid overload, PE, dissection, hypoxia, electrolyte abnormality   Co morbidities that complicate the patient evaluation  See H PI   Additional history obtained:  Additional history obtained from Nursing and Outside Medical Records   External records from outside source obtained and reviewed including triage RN note, cardiology note from January 2025  Lab Tests:  I Ordered, and personally interpreted labs.  The pertinent results include:      Imaging Studies ordered:  I ordered imaging studies including chest x-ray, CT dissection study I independently visualized and interpreted imaging which showed no acute cardiopulmonary pathology, aneurysm, dissection, nor PE I agree with the radiologist interpretation   Cardiac Monitoring:  The patient was maintained on a cardiac monitor.  I personally viewed and interpreted the cardiac monitored which showed an underlying rhythm of: Sinus bradycardia 50 bpm per previous EKGs    Consultations Obtained:  I requested consultation with cardiology Dr. Arlester Ladd,  and discussed lab and imaging findings as well as pertinent plan - they do not believe CP is ACS in nature and do not require admission from cardiac standpoint. Patient can follow up with outpatient cardiology next week   Problem List / ED Course:  CP SHOB Troponin negative x 2 Chest x-ray without abnormality CT angio negative for dissection, PE, and aneurysm  CP and SHOB resolve following exertion, stressful precipitating factor as per her previous chest pain per cardiology note in  January 2025 Does have very good cardiology follow-up with Dr. Berry Bristol through [facility].  She will see him next week for follow-up Heart score is low: 3 Also has baseline low heart rates per chart review. Cards notes one HR at 55bpm  Left leg and arm paresthesia No numbness reported.  Was evaluated by triage EDP who reported patient was neurologically intact and no code stroke was called (see her note) On my exam, she continues to be neurologically intact with no current paresthesia nor numbness.  This may be secondary to prolonged sitting in waiting room and suddenly getting up to walk Ambulates wo difficulty Provided strict return precautions to include stroke symptoms   Reevaluation:  After the interventions noted above, I reevaluated the patient and found that they have :improved     Dispostion:  After consideration of the diagnostic results and the patients response to treatment, I feel that the patent would benefit from outpatient management cardiology follow-up.  Discussed ED workup, disposition, return to ED precautions with patient who expresses understanding agrees with plan.  All questions answered to their satisfaction.  They are agreeable to plan.  Discharge instructions provided on paperwork  Final diagnoses:  Chest pain, unspecified type  Paresthesia of left leg    ED Discharge Orders     None          Royann Cords, PA 03/13/24 1942    Tonya Fredrickson, MD 03/14/24 1414

## 2024-03-15 ENCOUNTER — Other Ambulatory Visit: Payer: Self-pay | Admitting: *Deleted

## 2024-03-15 MED ORDER — ATORVASTATIN CALCIUM 40 MG PO TABS: 40.0000 mg | ORAL_TABLET | Freq: Every day | ORAL | 3 refills | Status: AC

## 2024-03-18 ENCOUNTER — Encounter: Payer: Self-pay | Admitting: Cardiology

## 2024-03-18 ENCOUNTER — Ambulatory Visit: Attending: Cardiology | Admitting: Cardiology

## 2024-03-18 VITALS — BP 120/64 | HR 65 | Resp 16 | Ht 64.0 in | Wt 137.0 lb

## 2024-03-18 DIAGNOSIS — E7800 Pure hypercholesterolemia, unspecified: Secondary | ICD-10-CM

## 2024-03-18 DIAGNOSIS — I25118 Atherosclerotic heart disease of native coronary artery with other forms of angina pectoris: Secondary | ICD-10-CM | POA: Diagnosis not present

## 2024-03-18 DIAGNOSIS — R9431 Abnormal electrocardiogram [ECG] [EKG]: Secondary | ICD-10-CM | POA: Diagnosis not present

## 2024-03-18 NOTE — Patient Instructions (Signed)
 Medication Instructions:  Your physician recommends that you continue on your current medications as directed. Please refer to the Current Medication list given to you today.  *If you need a refill on your cardiac medications before your next appointment, please call your pharmacy*  Lab Work: Have lab work (lipid profile) drawn today at the lab on the first floor If you have labs (blood work) drawn today and your tests are completely normal, you will receive your results only by: MyChart Message (if you have MyChart) OR A paper copy in the mail If you have any lab test that is abnormal or we need to change your treatment, we will call you to review the results.  Testing/Procedures: Your physician has requested that you have a cardiac catheterization. Cardiac catheterization is used to diagnose and/or treat various heart conditions. Doctors may recommend this procedure for a number of different reasons. The most common reason is to evaluate chest pain. Chest pain can be a symptom of coronary artery disease (CAD), and cardiac catheterization can show whether plaque is narrowing or blocking your heart's arteries. This procedure is also used to evaluate the valves, as well as measure the blood flow and oxygen levels in different parts of your heart. For further information please visit [email or web address] Please follow instruction sheet, as given.   Follow-Up: At [facility], you and your health needs are our priority.  As part of our continuing mission to provide you with exceptional heart care, our providers are all part of one team.  This team includes your primary Cardiologist (physician) and Advanced Practice Providers or APPs (Physician Assistants and Nurse Practitioners) who all work together to provide you with the care you need, when you need it.  Your next appointment:   August 5 at 11:45  Provider:  Olivia Pavy, PA- C         We recommend signing up for the patient  portal called MyChart.  Sign up information is provided on this After Visit Summary.  MyChart is used to connect with patients for Virtual Visits (Telemedicine).  Patients are able to view lab/test results, encounter notes, upcoming appointments, etc.  Non-urgent messages can be sent to your provider as well.   To learn more about what you can do with MyChart, go to [email or web address].   Other Instructions        Cardiac/Peripheral Catheterization   You are scheduled for a Cardiac Catheterization on Wednesday, July 2 with Dr. Gordy Bergamo.  1. Please arrive at the [facility] (Main Entrance A) at [facility]: [address] [place], [place] [address] at 9:30 AM (This time is 2 hour(s) before your procedure to ensure your preparation).   Free valet parking service is available. You will check in at ADMITTING. The support person will be asked to wait in the waiting room.  It is OK to have someone drop you off and come back when you are ready to be discharged.        Special note: Every effort is made to have your procedure done on time. Please understand that emergencies sometimes delay scheduled procedures.  2. Diet: Do not eat solid foods after midnight.  You may have clear liquids until 5 AM the day of the procedure.  3. Labs: You will need to have blood drawn on 03/13/24.   4. Medication instructions in preparation for your procedure:    On the morning of your procedure, take Aspirin  81 mg and Plavix /Clopidogrel  and any morning medicines  NOT listed above.  You may use sips of water.  5. Plan to go home the same day, you will only stay overnight if medically necessary. 6. You MUST have a responsible adult to drive you home. 7. An adult MUST be with you the first 24 hours after you arrive home. 8. Bring a current list of your medications, and the last time and date medication taken. 9. Bring ID and current insurance cards. 10.Please wear clothes that are easy to get on  and off and wear slip-on shoes.  Thank you for allowing us  to care for you!   -- [facility]

## 2024-03-18 NOTE — H&P (View-Only) (Signed)
 Cardiology Office Note:  .   Date:  03/19/2024  ID:  Marissa Lee, DOB 1950/10/22, MRN [id number] PCP: Allwardt, Mardy HERO, PA-C  [facility] Providers Cardiologist:  None   History of Present Illness: .   Marissa Lee is a 73 y.o. Caucasian female patient coronary disease SP CABG on 07/28/2021 for critical left main disease when she presented with substernal chest pain, hypercholesterolemia presents for follow-up of CAD, has had recurrent episodes of chest pain.  Due to this, a pharmacologic nuclear stress test on 01/05/2023 revealed normal LVEF with no evidence of ischemia and she has had an echocardiogram in 07/28/2021 revealing normal LVEF at 60 to 65% without significant valvular abnormality.   She was again seen for chest discomfort in January 2025 by Dr. Oneil Parchment and isosorbide  mononitrate dose was increased.  However patient called us  to make an appointment in view of persistent and worsening chest pain. Seen in the ED on 03/13/24 for chest pain.   Discussed the use of AI scribe software for clinical note transcription with the patient, who gave verbal consent to proceed.  History of Present Illness Marissa Lee is a 73 year old female with coronary artery disease who presents with recurrent chest pain. Recurrent chest pain persists with physical activity and stress, notably after a recent stressful event involving her sister. She visited the emergency department last week for chest pain.  Coronary artery disease history includes bypass surgery in 2022. Despite medication adjustments in January, chest pain continues. Current medications are Plavix  75 mg, atorvastatin  40 mg, Imdur  60 mg, and metoprolol  24 mg daily. She is not on aspirin  therapy. Fluoxetine  is taken for stress management, though its effectiveness is questioned.  Socially, she lives alone with her daughter nearby. A recent stressful situation with her sister, who is an alcoholic in assisted living, has increased her  stress levels.  Labs   Lab Results  Component Value Date   CHOL 156 03/18/2024   HDL 56 03/18/2024   LDLCALC 77 03/18/2024   TRIG 130 03/18/2024   CHOLHDL 2.8 03/18/2024   Lab Results  Component Value Date   NA 136 03/13/2024   K 3.6 03/13/2024   CO2 25 03/13/2024   GLUCOSE 79 03/13/2024   BUN 13 03/13/2024   CREATININE 1.01 (H) 03/13/2024   CALCIUM  8.6 (L) 03/13/2024   GFR 65.51 06/20/2023   EGFR 75 11/18/2021   GFRNONAA 60 (L) 03/13/2024      Latest Ref Rng & Units 03/13/2024   11:37 AM 06/20/2023    1:51 PM 05/03/2023   11:34 AM  BMP  Glucose 70 - 99 mg/dL 79  89  92   BUN 8 - 23 mg/dL 13  10  11    Creatinine 0.44 - 1.00 mg/dL 8.98  9.10  9.11   Sodium 135 - 145 mmol/L 136  141  141   Potassium 3.5 - 5.1 mmol/L 3.6  4.2  4.0   Chloride 98 - 111 mmol/L 105  104  103   CO2 22 - 32 mmol/L 25  31  30    Calcium  8.9 - 10.3 mg/dL 8.6  9.6  9.9       Latest Ref Rng & Units 03/13/2024   11:37 AM 06/20/2023    1:51 PM 05/03/2023   11:34 AM  CBC  WBC 4.0 - 10.5 K/uL 6.3  5.8  7.1   Hemoglobin 12.0 - 15.0 g/dL 86.7  86.1  86.0   Hematocrit 36.0 - 46.0 % 41.2  42.7  42.9   Platelets 150 - 400 K/uL 291  340.0  415.0    Lab Results  Component Value Date   HGBA1C 5.5 06/20/2023    Lab Results  Component Value Date   TSH 4.75 05/03/2023     ROS  Review of Systems  Cardiovascular:  Positive for chest pain. Negative for dyspnea on exertion and leg swelling.    Physical Exam:   VS:  BP 120/64 (BP Location: Left Arm, Patient Position: Sitting, Cuff Size: Normal)   Pulse 65   Resp 16   LMP 09/27/2003   SpO2 97%    Wt Readings from Last 3 Encounters:  03/13/24 140 lb (63.5 kg)  03/01/24 140 lb (63.5 kg)  12/06/23 140 lb 3.2 oz (63.6 kg)    Physical Exam Neck:     Vascular: No carotid bruit or JVD.   Cardiovascular:     Rate and Rhythm: Normal rate and regular rhythm.     Pulses: Intact distal pulses.     Heart sounds: Normal heart sounds. No murmur heard.    No  gallop.  Pulmonary:     Effort: Pulmonary effort is normal.     Breath sounds: Normal breath sounds.  Abdominal:     General: Bowel sounds are normal.     Palpations: Abdomen is soft.   Musculoskeletal:     Right lower leg: No edema.     Left lower leg: No edema.    Studies Reviewed: .    CT Angio chest and abdomen 03/13/2024: No evidence of aortic aneurysm or dissection. Prior CABG. Moderate infrarenal atherosclerosis. No evidence of pulmonary embolus. Moderate to large hiatal hernia. No acute findings in the chest, abdomen or pelvis.  EKG:    EKG Interpretation Date/Time:  Monday March 18 2024 14:24:13 EDT Ventricular Rate:  65 PR Interval:  164 QRS Duration:  78 QT Interval:  370 QTC Calculation: 384 R Axis:   -35  Text Interpretation: EKG 03/18/2024: Normal sinus rhythm with rate of 65 bpm, left anterior fascicular block.  Poor R wave progression.  Nonspecific T abnormality. Compared to 03/13/2024, left axis deviation and nonspecific anterior T abnormality is new. Confirmed by Kanija Remmel, Jagadeesh ([id number]) on 03/18/2024 2:57:17 PM    Medications ordered    No orders of the defined types were placed in this encounter.    ASSESSMENT AND PLAN: .      ICD-10-CM   1. Coronary artery disease of native artery of native heart with stable angina pectoris (HCC)  I25.118 EKG 12-Lead    Lipid Profile    2. Abnormal EKG  R94.31     3. Pure hypercholesterolemia  E78.00 Lipid Profile     Assessment & Plan Stable angina Recurrent episodes of chest pain exacerbated by stress and physical activity. Coronary artery disease with previous CABG in 2022. Recent emergency department visit for chest pain. Mild EKG changes today suggest angina, possibly due to graft occlusion or new coronary artery disease. Heart catheterization is planned to assess graft patency and coronary arteries. Risks include less than 1% chance of death, stroke, myocardial infarction, or urgent bypass surgery. Procedure  involves transient sedation and is generally painless, with potential discomfort in the arm. If blockages are found, intervention will be performed during catheterization. - Schedule heart catheterization to assess graft patency and coronary arteries. - Continue Plavix  75 mg daily, atorvastatin  40 mg daily, Imdur  60 mg daily, and metoprolol  succinate 24 mg daily. - Discuss potential need for intervention during  catheterization if blockages are found. - Ensure daughter can stay with her post-procedure for monitoring.  Abnormal EKG New mild EKG changes today, contributing to decision for heart catheterization.  Coronary artery bypass grafting (CABG) Underwent CABG in 2022. Current symptoms and EKG changes necessitate evaluation of graft patency.  Hyperlipidemia Cholesterol levels previously well-controlled, but LDL was 88 mg/dL, above the target of less than 70 mg/dL. No recent cholesterol check in the past two years. - Check cholesterol levels during hospital visit for catheterization. - Continue atorvastatin  40 mg daily. _ Lipid panel reviewed, LDL >70 and hence will consider addition of Zetia  10 mg to Atorva 40 mg daily.   Stress management Significant stress related to family issues, contributing to chest pain episodes. Discussion about potential benefits of seeing a psychologist to manage stress effectively. - Consider referral to psychologist for stress management. - Consider lifestyle changes to reduce stress impact on cardiac health.   Signed,  Gordy Bergamo, MD, [facility] 03/19/2024, 8:40 PM [facility] [address] [place], [place] [address] Phone: [phone]. Fax:  [phone]

## 2024-03-18 NOTE — Progress Notes (Unsigned)
 Cardiology Office Note:  .   Date:  03/19/2024  ID:  Marissa Lee, DOB 13-May-1951, MRN [id number] PCP: Allwardt, Mardy HERO, PA-C  [facility] Providers Cardiologist:  None   History of Present Illness: .   Marissa Lee is a 73 y.o. Caucasian female patient coronary disease SP CABG on 07/28/2021 for critical left main disease when she presented with substernal chest pain, hypercholesterolemia presents for follow-up of CAD, has had recurrent episodes of chest pain.  Due to this, a pharmacologic nuclear stress test on 01/05/2023 revealed normal LVEF with no evidence of ischemia and she has had an echocardiogram in 07/28/2021 revealing normal LVEF at 60 to 65% without significant valvular abnormality.   She was again seen for chest discomfort in January 2025 by Dr. Oneil Parchment and isosorbide  mononitrate dose was increased.  However patient called us  to make an appointment in view of persistent and worsening chest pain. Seen in the ED on 03/13/24 for chest pain.   Discussed the use of AI scribe software for clinical note transcription with the patient, who gave verbal consent to proceed.  History of Present Illness Marissa Lee is a 73 year old female with coronary artery disease who presents with recurrent chest pain. Recurrent chest pain persists with physical activity and stress, notably after a recent stressful event involving her sister. She visited the emergency department last week for chest pain.  Coronary artery disease history includes bypass surgery in 2022. Despite medication adjustments in January, chest pain continues. Current medications are Plavix  75 mg, atorvastatin  40 mg, Imdur  60 mg, and metoprolol  24 mg daily. She is not on aspirin  therapy. Fluoxetine  is taken for stress management, though its effectiveness is questioned.  Socially, she lives alone with her daughter nearby. A recent stressful situation with her sister, who is an alcoholic in assisted living, has increased her  stress levels.  Labs   Lab Results  Component Value Date   CHOL 156 03/18/2024   HDL 56 03/18/2024   LDLCALC 77 03/18/2024   TRIG 130 03/18/2024   CHOLHDL 2.8 03/18/2024   Lab Results  Component Value Date   NA 136 03/13/2024   K 3.6 03/13/2024   CO2 25 03/13/2024   GLUCOSE 79 03/13/2024   BUN 13 03/13/2024   CREATININE 1.01 (H) 03/13/2024   CALCIUM  8.6 (L) 03/13/2024   GFR 65.51 06/20/2023   EGFR 75 11/18/2021   GFRNONAA 60 (L) 03/13/2024      Latest Ref Rng & Units 03/13/2024   11:37 AM 06/20/2023    1:51 PM 05/03/2023   11:34 AM  BMP  Glucose 70 - 99 mg/dL 79  89  92   BUN 8 - 23 mg/dL 13  10  11    Creatinine 0.44 - 1.00 mg/dL 8.98  9.10  9.11   Sodium 135 - 145 mmol/L 136  141  141   Potassium 3.5 - 5.1 mmol/L 3.6  4.2  4.0   Chloride 98 - 111 mmol/L 105  104  103   CO2 22 - 32 mmol/L 25  31  30    Calcium  8.9 - 10.3 mg/dL 8.6  9.6  9.9       Latest Ref Rng & Units 03/13/2024   11:37 AM 06/20/2023    1:51 PM 05/03/2023   11:34 AM  CBC  WBC 4.0 - 10.5 K/uL 6.3  5.8  7.1   Hemoglobin 12.0 - 15.0 g/dL 86.7  86.1  86.0   Hematocrit 36.0 - 46.0 % 41.2  42.7  42.9   Platelets 150 - 400 K/uL 291  340.0  415.0    Lab Results  Component Value Date   HGBA1C 5.5 06/20/2023    Lab Results  Component Value Date   TSH 4.75 05/03/2023     ROS  Review of Systems  Cardiovascular:  Positive for chest pain. Negative for dyspnea on exertion and leg swelling.    Physical Exam:   VS:  BP 120/64 (BP Location: Left Arm, Patient Position: Sitting, Cuff Size: Normal)   Pulse 65   Resp 16   LMP 09/27/2003   SpO2 97%    Wt Readings from Last 3 Encounters:  03/13/24 140 lb (63.5 kg)  03/01/24 140 lb (63.5 kg)  12/06/23 140 lb 3.2 oz (63.6 kg)    Physical Exam Neck:     Vascular: No carotid bruit or JVD.   Cardiovascular:     Rate and Rhythm: Normal rate and regular rhythm.     Pulses: Intact distal pulses.     Heart sounds: Normal heart sounds. No murmur heard.    No  gallop.  Pulmonary:     Effort: Pulmonary effort is normal.     Breath sounds: Normal breath sounds.  Abdominal:     General: Bowel sounds are normal.     Palpations: Abdomen is soft.   Musculoskeletal:     Right lower leg: No edema.     Left lower leg: No edema.    Studies Reviewed: .    CT Angio chest and abdomen 03/13/2024: No evidence of aortic aneurysm or dissection. Prior CABG. Moderate infrarenal atherosclerosis. No evidence of pulmonary embolus. Moderate to large hiatal hernia. No acute findings in the chest, abdomen or pelvis.  EKG:    EKG Interpretation Date/Time:  Monday March 18 2024 14:24:13 EDT Ventricular Rate:  65 PR Interval:  164 QRS Duration:  78 QT Interval:  370 QTC Calculation: 384 R Axis:   -35  Text Interpretation: EKG 03/18/2024: Normal sinus rhythm with rate of 65 bpm, left anterior fascicular block.  Poor R wave progression.  Nonspecific T abnormality. Compared to 03/13/2024, left axis deviation and nonspecific anterior T abnormality is new. Confirmed by Dishawn Bhargava, Jagadeesh ([id number]) on 03/18/2024 2:57:17 PM    Medications ordered    No orders of the defined types were placed in this encounter.    ASSESSMENT AND PLAN: .      ICD-10-CM   1. Coronary artery disease of native artery of native heart with stable angina pectoris (HCC)  I25.118 EKG 12-Lead    Lipid Profile    2. Abnormal EKG  R94.31     3. Pure hypercholesterolemia  E78.00 Lipid Profile     Assessment & Plan Stable angina Recurrent episodes of chest pain exacerbated by stress and physical activity. Coronary artery disease with previous CABG in 2022. Recent emergency department visit for chest pain. Mild EKG changes today suggest angina, possibly due to graft occlusion or new coronary artery disease. Heart catheterization is planned to assess graft patency and coronary arteries. Risks include less than 1% chance of death, stroke, myocardial infarction, or urgent bypass surgery. Procedure  involves transient sedation and is generally painless, with potential discomfort in the arm. If blockages are found, intervention will be performed during catheterization. - Schedule heart catheterization to assess graft patency and coronary arteries. - Continue Plavix  75 mg daily, atorvastatin  40 mg daily, Imdur  60 mg daily, and metoprolol  succinate 24 mg daily. - Discuss potential need for intervention during  catheterization if blockages are found. - Ensure daughter can stay with her post-procedure for monitoring.  Abnormal EKG New mild EKG changes today, contributing to decision for heart catheterization.  Coronary artery bypass grafting (CABG) Underwent CABG in 2022. Current symptoms and EKG changes necessitate evaluation of graft patency.  Hyperlipidemia Cholesterol levels previously well-controlled, but LDL was 88 mg/dL, above the target of less than 70 mg/dL. No recent cholesterol check in the past two years. - Check cholesterol levels during hospital visit for catheterization. - Continue atorvastatin  40 mg daily. _ Lipid panel reviewed, LDL >70 and hence will consider addition of Zetia  10 mg to Atorva 40 mg daily.   Stress management Significant stress related to family issues, contributing to chest pain episodes. Discussion about potential benefits of seeing a psychologist to manage stress effectively. - Consider referral to psychologist for stress management. - Consider lifestyle changes to reduce stress impact on cardiac health.   Signed,  Gordy Bergamo, MD, [facility] 03/19/2024, 8:40 PM [facility] [address] [place], [place] [address] Phone: [phone]. Fax:  [phone]

## 2024-03-19 LAB — LIPID PANEL
Chol/HDL Ratio: 2.8 ratio (ref 0.0–4.4)
Cholesterol, Total: 156 mg/dL (ref 100–199)
HDL: 56 mg/dL (ref >39)
LDL Chol Calc (NIH): 77 mg/dL (ref 0–99)
Triglycerides: 130 mg/dL (ref 0–149)
VLDL Cholesterol Cal: 23 mg/dL (ref 5–40)

## 2024-03-26 ENCOUNTER — Telehealth: Payer: Self-pay | Admitting: *Deleted

## 2024-03-26 NOTE — Telephone Encounter (Signed)
 Cardiac Catheterization scheduled at [facility] for: Wednesday March 27, 2024 12 Noon (time change) Arrival time [facility] Main Entrance A at: 10 AM  Nothing to eat after midnight prior to procedure, clear liquids until 5 AM day of procedure  Medication instructions: -Usual morning medications can be taken with sips of water including aspirin  81 mg and Plavix  75 mg.  Plan to go home the same day, you will only stay overnight if medically necessary.  You must have responsible adult to drive you home.  Someone must be with you the first 24 hours after you arrive home.  Left message for patient to call back to review procedure instructions

## 2024-03-26 NOTE — Telephone Encounter (Signed)
 Patient tells me she has had a soft stool and a loose stool today, no other symptoms, feels it may be related to her diet. Patient states she is afebrile, generally feels okay, has not been around anyone with similar symptoms/virus. Patient does not want to reschedule cath.  Patient advised would proceed with cath tomorrow, but if symptoms are worse in the morning, she will call me and will reschedule cath. I discussed staying well hydrated with patient prior to cath, she knows she can have clear liquids until 5 AM tomorrow morning.

## 2024-03-26 NOTE — Telephone Encounter (Signed)
 Reviewed procedure instructions with patient.

## 2024-03-26 NOTE — Telephone Encounter (Signed)
 Patient returned call

## 2024-03-27 ENCOUNTER — Encounter (HOSPITAL_COMMUNITY)
Admission: RE | Disposition: A | Discharge status: Home / Self Care | Payer: Self-pay | Source: Ambulatory Visit | Attending: Cardiology

## 2024-03-27 ENCOUNTER — Ambulatory Visit (HOSPITAL_COMMUNITY)
Admission: RE | Admit: 2024-03-27 | Discharge: 2024-03-27 | Disposition: A | Discharge status: Home / Self Care | Source: Ambulatory Visit | Attending: Cardiology | Admitting: Cardiology

## 2024-03-27 ENCOUNTER — Other Ambulatory Visit: Payer: Self-pay

## 2024-03-27 DIAGNOSIS — Z951 Presence of aortocoronary bypass graft: Secondary | ICD-10-CM | POA: Diagnosis not present

## 2024-03-27 DIAGNOSIS — I25118 Atherosclerotic heart disease of native coronary artery with other forms of angina pectoris: Secondary | ICD-10-CM | POA: Insufficient documentation

## 2024-03-27 DIAGNOSIS — I2510 Atherosclerotic heart disease of native coronary artery without angina pectoris: Secondary | ICD-10-CM | POA: Diagnosis not present

## 2024-03-27 DIAGNOSIS — R079 Chest pain, unspecified: Secondary | ICD-10-CM | POA: Diagnosis present

## 2024-03-27 DIAGNOSIS — Z79899 Other long term (current) drug therapy: Secondary | ICD-10-CM | POA: Insufficient documentation

## 2024-03-27 DIAGNOSIS — E7800 Pure hypercholesterolemia, unspecified: Secondary | ICD-10-CM | POA: Insufficient documentation

## 2024-03-27 DIAGNOSIS — R9431 Abnormal electrocardiogram [ECG] [EKG]: Secondary | ICD-10-CM | POA: Insufficient documentation

## 2024-03-27 HISTORY — PX: LEFT HEART CATH AND CORS/GRAFTS ANGIOGRAPHY: CATH118250

## 2024-03-27 SURGERY — LEFT HEART CATH AND CORS/GRAFTS ANGIOGRAPHY
Anesthesia: LOCAL

## 2024-03-27 MED ORDER — SODIUM CHLORIDE 0.9 % WEIGHT BASED INFUSION: 1.0000 mL/kg/h | INTRAVENOUS | Status: DC

## 2024-03-27 MED ORDER — HEPARIN SODIUM (PORCINE) 1000 UNIT/ML IJ SOLN
INTRAMUSCULAR | Status: AC
  Filled 2024-03-27: qty 10

## 2024-03-27 MED ORDER — ASPIRIN 81 MG PO CHEW: 81.0000 mg | CHEWABLE_TABLET | ORAL | Status: DC

## 2024-03-27 MED ORDER — CLOPIDOGREL BISULFATE 75 MG PO TABS: 75.0000 mg | ORAL_TABLET | ORAL | Status: DC

## 2024-03-27 MED ORDER — FENTANYL CITRATE (PF) 100 MCG/2ML IJ SOLN
INTRAMUSCULAR | Status: DC | PRN
  Administered 2024-03-27: 25 ug via INTRAVENOUS

## 2024-03-27 MED ORDER — VERAPAMIL HCL 2.5 MG/ML IV SOLN
INTRAVENOUS | Status: DC | PRN
  Administered 2024-03-27 (×2): 10 mL via INTRA_ARTERIAL

## 2024-03-27 MED ORDER — LIDOCAINE HCL (PF) 1 % IJ SOLN
INTRAMUSCULAR | Status: DC | PRN
Start: 2024-03-27 — End: 2024-03-27
  Administered 2024-03-27: 2 mL via INTRADERMAL

## 2024-03-27 MED ORDER — IOHEXOL 350 MG/ML SOLN
INTRAVENOUS | Status: DC | PRN
  Administered 2024-03-27: 65 mL

## 2024-03-27 MED ORDER — HEPARIN (PORCINE) IN NACL 1000-0.9 UT/500ML-% IV SOLN
INTRAVENOUS | Status: DC | PRN
Start: 2024-03-27 — End: 2024-03-27
  Administered 2024-03-27 (×2): 500 mL

## 2024-03-27 MED ORDER — LIDOCAINE HCL (PF) 1 % IJ SOLN
INTRAMUSCULAR | Status: AC
Start: 2024-03-27 — End: 2024-03-27
  Filled 2024-03-27: qty 30

## 2024-03-27 MED ORDER — MIDAZOLAM HCL 2 MG/2ML IJ SOLN
INTRAMUSCULAR | Status: DC | PRN
Start: 2024-03-27 — End: 2024-03-27
  Administered 2024-03-27: 2 mg via INTRAVENOUS

## 2024-03-27 MED ORDER — FENTANYL CITRATE (PF) 100 MCG/2ML IJ SOLN
INTRAMUSCULAR | Status: AC
  Filled 2024-03-27: qty 2

## 2024-03-27 MED ORDER — MIDAZOLAM HCL 2 MG/2ML IJ SOLN
INTRAMUSCULAR | Status: AC
  Filled 2024-03-27: qty 2

## 2024-03-27 MED ORDER — VERAPAMIL HCL 2.5 MG/ML IV SOLN
INTRAVENOUS | Status: AC
  Filled 2024-03-27: qty 2

## 2024-03-27 MED ORDER — SODIUM CHLORIDE 0.9 % WEIGHT BASED INFUSION: 3.0000 mL/kg/h | INTRAVENOUS | Status: AC

## 2024-03-27 MED ORDER — AMLODIPINE BESYLATE 5 MG PO TABS: 5.0000 mg | ORAL_TABLET | Freq: Every day | ORAL | 1 refills | Status: DC

## 2024-03-27 MED ORDER — HEPARIN SODIUM (PORCINE) 1000 UNIT/ML IJ SOLN
INTRAMUSCULAR | Status: DC | PRN
  Administered 2024-03-27: 5000 [IU] via INTRAVENOUS

## 2024-03-27 MED ORDER — NITROGLYCERIN 1 MG/10 ML FOR IR/CATH LAB
INTRA_ARTERIAL | Status: AC
  Filled 2024-03-27: qty 10

## 2024-03-27 MED ORDER — NITROGLYCERIN 1 MG/10 ML FOR IR/CATH LAB
INTRA_ARTERIAL | Status: DC | PRN
Start: 2024-03-27 — End: 2024-03-27
  Administered 2024-03-27 (×2): 200 ug via INTRACORONARY

## 2024-03-27 SURGICAL SUPPLY — 10 items
CATH INFINITI 5 FR IM (CATHETERS) IMPLANT
CATH INFINITI 5 FR JL3.5 (CATHETERS) IMPLANT
CATH INFINITI 5FR AL1 (CATHETERS) IMPLANT
CATH INFINITI 5FR MULTPACK ANG (CATHETERS) IMPLANT
COVER PRB 48X5XTLSCP FOLD TPE (BAG) IMPLANT
DEVICE RAD COMP TR BAND LRG (VASCULAR PRODUCTS) IMPLANT
GLIDESHEATH SLEND A-KIT 6F 22G (SHEATH) IMPLANT
GUIDEWIRE INQWIRE 1.5J.035X260 (WIRE) IMPLANT
PACK CARDIAC CATHETERIZATION (CUSTOM PROCEDURE TRAY) ×1 IMPLANT
SET ATX-X65L (MISCELLANEOUS) IMPLANT

## 2024-03-27 NOTE — Discharge Instructions (Signed)

## 2024-03-27 NOTE — Interval H&P Note (Signed)
 History and Physical Interval Note:  03/27/2024 1:15 PM  Marissa Lee  has presented today for surgery, with the diagnosis of chest pain.  The various methods of treatment have been discussed with the patient and family. After consideration of risks, benefits and other options for treatment, the patient has consented to  Procedure(s): LEFT HEART CATH AND CORS/GRAFTS ANGIOGRAPHY (N/A) and coronary angioplasty as a surgical intervention.  The patient's history has been reviewed, patient examined, no change in status, stable for surgery.  I have reviewed the patient's chart and labs.  Questions were answered to the patient's satisfaction.     Gordy Bergamo

## 2024-03-28 ENCOUNTER — Encounter (HOSPITAL_COMMUNITY): Payer: Self-pay | Admitting: Cardiology

## 2024-04-02 ENCOUNTER — Encounter (INDEPENDENT_AMBULATORY_CARE_PROVIDER_SITE_OTHER): Payer: Self-pay | Admitting: Otolaryngology

## 2024-04-02 ENCOUNTER — Ambulatory Visit (INDEPENDENT_AMBULATORY_CARE_PROVIDER_SITE_OTHER): Admitting: Otolaryngology

## 2024-04-02 ENCOUNTER — Ambulatory Visit (INDEPENDENT_AMBULATORY_CARE_PROVIDER_SITE_OTHER): Admitting: Audiology

## 2024-04-02 VITALS — BP 107/70 | HR 54

## 2024-04-02 DIAGNOSIS — H6993 Unspecified Eustachian tube disorder, bilateral: Secondary | ICD-10-CM | POA: Diagnosis not present

## 2024-04-02 DIAGNOSIS — H90A21 Sensorineural hearing loss, unilateral, right ear, with restricted hearing on the contralateral side: Secondary | ICD-10-CM | POA: Diagnosis not present

## 2024-04-02 DIAGNOSIS — H6522 Chronic serous otitis media, left ear: Secondary | ICD-10-CM

## 2024-04-02 DIAGNOSIS — H9041 Sensorineural hearing loss, unilateral, right ear, with unrestricted hearing on the contralateral side: Secondary | ICD-10-CM | POA: Diagnosis not present

## 2024-04-02 DIAGNOSIS — H9072 Mixed conductive and sensorineural hearing loss, unilateral, left ear, with unrestricted hearing on the contralateral side: Secondary | ICD-10-CM

## 2024-04-02 DIAGNOSIS — H938X3 Other specified disorders of ear, bilateral: Secondary | ICD-10-CM | POA: Diagnosis not present

## 2024-04-02 DIAGNOSIS — H90A32 Mixed conductive and sensorineural hearing loss, unilateral, left ear with restricted hearing on the contralateral side: Secondary | ICD-10-CM

## 2024-04-02 NOTE — Progress Notes (Signed)
 Dear Dr. Kathrene, Here is my assessment for our mutual patient, Marissa Lee. Thank you for allowing me the opportunity to care for your patient. Please do not hesitate to contact me should you have any other questions. Sincerely, Dr. Eldora Blanch  Otolaryngology Clinic Note Referring provider: Dr. Kathrene HPI:  Marissa Lee is a 73 y.o. female kindly referred by Dr. Kathrene for evaluation of ear fullness (bilateral) and hearing loss.  Initial visit (02/2024): Patient reports: noted bilateral ear fullness ongoing for a year. Feels like there is water in my ear - progressively getting worse. She also notes that her hearing is muffled -- bilaterally. Denies antecedent event including any URI or sickness. Noted URI/congestion bilaterally from chart review with cough in May 2025. Denies frequent sinus infections. Does have seasonal allergies - nothing ever been diagnosed -- does not use any nasal sprays. Have not tried anything for this. Nothing makes it better or worse. Patient denies: ear pain, vertigo, drainage, tinnitus Patient additionally denies: deep pain in ear canal, eustachian tube symptoms such as popping/crackling, sensitive to pressure changes Patient also denies barotrauma, vestibular suppressant use, ototoxic medication use Prior ear surgery: no  --------------------------------------------------------- 04/02/2024 Returns for follow up. Noted that still with some difficulty with left ear with muffled hearing; right ear feels clearer; of note, she reports that she has not had been during her sprays for past 3-4 weeks given some angina for which she underwent a cath. She is on flonase  and astelin . No recent URI, no significant congestion or nasal drainage.   H&N Surgery: no Personal or FHx of bleeding dz or anesthesia difficulty: no  GLP-1: no AP/AC: Plavix   Tobacco: quit (20 year use)  PMHx: CAD s/p CABG, HLD  Independent Review of Additional Tests or Records:   Corean Hudnell (12/06/2023): bilateral ear fullness, present since last summer; decrease in hearing; Dx: ETD; Rx: ref t oENT Alyssa Allwardt (5/230/2024): noted productive cough, fatigue; presumed viral illness; supportive care Labs: CBC and CMP 06/20/2023: WBC 5.8, Eos 100, BUN/Cr wnl 03/2024 Audiogram was independently reviewed and interpreted by me and it reveals - normal downsloping to mod essentially SNHL AD with type C tymp and conductive component at 4K Hz with CHL AS with ABG ~20dB with mixed component 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; WRT 100% at 60dB; type B tymp AS   SNHL= Sensorineural hearing loss   PMH/Meds/All/SocHx/FamHx/ROS:   Past Medical History:  Diagnosis Date   Anxiety    CAD (coronary artery disease), autologous vein bypass graft    Endometriosis    Fibrocystic breast    muliple drainage   Fibroid    GERD (gastroesophageal reflux disease)    Hiatal hernia    History of bronchitis    Hypercholesteremia 2007   currently not taking anything for it   Melanoma of toe, 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/2022   Osteopenia 08/2016   hip and spine   Postmenopausal state 06/05/2013   Subclinical hypothyroidism 10/04/2018   Urge incontinence 06/05/2013     Past Surgical History:  Procedure Laterality Date   ABDOMINAL HYSTERECTOMY     BLADDER SUSPENSION  2004   done with [facility]   BREAST BIOPSY  1980   done 3x   CLOSED REDUCTION METACARPAL WITH PERCUTANEOUS PINNING Right 08/24/2016   Procedure: CLOSED REDUCTION  WITH PERCUTANEOUS PINNING;  Surgeon: Prentice Pagan, MD;  Location: [facility] OR;  Service: Orthopedics;  Laterality: Right;   COLONOSCOPY     COMBINED HYSTERECTOMY VAGINAL / OOPHORECTOMY / A&P REPAIR  2004  CORONARY ARTERY BYPASS GRAFT N/A 07/28/2021   Procedure: CORONARY ARTERY BYPASS GRAFTING (CABG) times two using left internal mammary artery and  left leg saphenous vein;  Surgeon: Kerrin Elspeth BROCKS, MD;  Location: [facility] OR;  Service: Open Heart Surgery;  Laterality: N/A;   FOOT SURGERY  Right 1992   IABP INSERTION Right 07/28/2021   Procedure: IABP Insertion;  Surgeon: Anner Alm ORN, MD;  Location: [facility] INVASIVE CV LAB;  Service: Cardiovascular;  Laterality: Right;   LEFT HEART CATH AND CORONARY ANGIOGRAPHY N/A 07/28/2021   Procedure: LEFT HEART CATH AND CORONARY ANGIOGRAPHY;  Surgeon: Anner Alm ORN, MD;  [facility] CV LAB;  Service: Cardiovascular;  Laterality: N/A;   LEFT HEART CATH AND CORS/GRAFTS ANGIOGRAPHY N/A 03/27/2024   Procedure: LEFT HEART CATH AND CORS/GRAFTS ANGIOGRAPHY;  Surgeon: Ladona Heinz, MD;  Location: [facility] INVASIVE CV LAB;  Service: Cardiovascular;  Laterality: N/A;   OPEN REDUCTION INTERNAL FIXATION (ORIF) HAND Right 08/24/2016   Procedure: RIGHT HAND LONG RING AND SMALL FINGER  POSSIBLE ORIF;  Surgeon: Prentice Pagan, MD;  Location: [facility] OR;  Service: Orthopedics;  Laterality: Right;   WISDOM TOOTH EXTRACTION  1978    Family History  Problem Relation Age of Onset   Heart disease Mother        valvular disease, valve replacement   Atrial fibrillation Mother    Stroke Mother    Heart disease Brother        valve disease, valve surgery   Thyroid  disease Maternal Grandmother        Goiter   Colon cancer Paternal Grandmother    Cancer Paternal Grandmother        Colon    Cancer Paternal Grandfather        Lung   Thyroid  disease Daughter        Hypothyroid   Esophageal cancer Neg Hx    Liver disease Neg Hx    Rectal cancer Neg Hx    Stomach cancer Neg Hx      Social Connections: Not on file      Current Outpatient Medications:    amLODipine  (NORVASC ) 5 MG tablet, Take 1 tablet (5 mg total) by mouth daily., Disp: 30 tablet, Rfl: 1   Apoaequorin (PREVAGEN PO), Take 1 tablet by mouth daily at 2 am., Disp: , Rfl:    atorvastatin  (LIPITOR ) 40 MG tablet, Take 1 tablet (40 mg total) by mouth daily., Disp: 90 tablet, Rfl: 3   azelastine  (ASTELIN ) 0.1 % nasal spray, Place 2 sprays into both nostrils 2 (two) times daily. Use in each nostril as  directed (Patient taking differently: Place 2 sprays into both nostrils 2 (two) times daily as needed. Use in each nostril as directed), Disp: 30 mL, Rfl: 12   Calcium -Phosphorus-Vitamin D  (CITRACAL +D3 PO), Take 1 tablet by mouth daily. Gummy, Disp: , Rfl:    clopidogrel  (PLAVIX ) 75 MG tablet, TAKE 1 TABLET(75 MG) BY MOUTH DAILY, Disp: 30 tablet, Rfl: 0   Dextrose , Diabetic Use, (GLUCOSE PO), Take 1 tablet by mouth daily as needed (hypoglycemic episodes)., Disp: , Rfl:    FLUoxetine  (PROZAC ) 10 MG capsule, TAKE 1 CAPSULE BY MOUTH EVERY DAY, Disp: 90 capsule, Rfl: 0   FLUoxetine  (PROZAC ) 40 MG capsule, TAKE 1 CAPSULE BY MOUTH EVERY DAY, Disp: 90 capsule, Rfl: 0   fluticasone  (FLONASE ) 50 MCG/ACT nasal spray, Place 2 sprays into both nostrils in the morning and at bedtime. (Patient taking differently: Place 2 sprays into both nostrils 2 (two) times daily  as needed for allergies or rhinitis.), Disp: 16 g, Rfl: 6   isosorbide  mononitrate (IMDUR ) 60 MG 24 hr tablet, Take 1 tablet (60 mg total) by mouth daily., Disp: 90 tablet, Rfl: 3   metoprolol  succinate (TOPROL -XL) 25 MG 24 hr tablet, TAKE 1/2 TABLET(12.5 MG) BY MOUTH DAILY, Disp: 45 tablet, Rfl: 2   nitroGLYCERIN  (NITROSTAT ) 0.4 MG SL tablet, PLACE 1 TABLET UNDER THE TONGUE EVERY 5 MINUTES AS NEEDED FOR CHEST PAIN, Disp: 25 tablet, Rfl: 3   pantoprazole  (PROTONIX ) 40 MG tablet, TAKE 1 TABLET(40 MG) BY MOUTH EVERY DAY, Disp: 90 tablet, Rfl: 3   Physical Exam:   BP 107/70   Pulse (!) 54   LMP 09/27/2003   SpO2 97%   Salient findings:  CN II-XII intact Given history and complaints, ear microscopy was indicated and performed for evaluation with findings as below in physical exam section and in procedures; Bilateral EAC clear and TM intact - bilateral pars flaccida modest retraction, clean; query mild effusion serous left - left retraction worse than right Weber 512: mid Rinne 512: AC > BC b/l  Anterior rhinoscopy: Septum intact; bilateral inferior  turbinates without significant hypertrophy No lesions of oral cavity/oropharynx No obviously palpable neck masses No respiratory distress or stridor  Seprately Identifiable Procedures:  Prior to initiating any procedures, risks/benefits/alternatives were explained to the patient and verbal consent obtained. Procedure: Bilateral ear microscopy using microscope (CPT [id number]) Pre-procedure diagnosis: bilateral ear fullness, concern for effusion in middle ear Post-procedure diagnosis: same Indication: see above; given patient's otologic complaints and history, for improved and comprehensive examination of external ear and tympanic membrane, bilateral otologic examination using microscope was performed  Procedure: Patient was placed semi-recumbent. Both ear canals were examined using the microscope with findings above. Patient tolerated the procedure well.   Impression & Plans:  Tenecia Ignasiak is a 73 y.o. female with:  1. Dysfunction of both eustachian tubes   2. Sensation of fullness in both ears   3. Sensorineural hearing loss (SNHL) of right ear with restricted hearing of left ear   4. Left chronic serous otitis media    Discussed exam and findings, sx appear to be most consistent with ETD. We discussed options: medical v/s BTT She has not been using flonase  and astelin  consistently given her angina. She would like to continue doing sprays given some benefit on right despite inconsistent use so will do that prior to proceeding with any surgical intervention  - Continue flonase  and astelin  BID - Autoinsufflate ears.  - f/u in 3 months with audio  See below regarding exact medications prescribed this encounter including dosages and route: No orders of the defined types were placed in this encounter.     Thank you for allowing me the opportunity to care for your patient. Please do not hesitate to contact me should you have any other questions.  Sincerely, Eldora Blanch,  MD Otolaryngologist (ENT), [facility] Phone: [phone] Fax: [phone]  04/02/2024, 10:49 AM   I have personally spent 31 minutes involved in face-to-face and non-face-to-face activities for this patient on the day of the visit.  Professional time spent excludes any procedures performed but includes the following activities, in addition to those noted in the documentation: preparing to see the patient (review of outside documentation and results), performing a medically appropriate examination, counseling, documenting in the electronic health record

## 2024-04-02 NOTE — Progress Notes (Signed)
  [address] [place], [place] [address] [phone]  Audiological Evaluation    Name: Marissa Lee     DOB:   [birth year]      MRN:   [id number]                                                                                     Service Date: 04/02/2024     Accompanied by: unaccompanied   Patient comes today after Dr. Tobie, ENT sent a referral for a hearing evaluation due to concerns with ear fullness.   Symptoms Yes Details  Hearing loss  []    Tinnitus  []    Ear pain/ infections/pressure  [x]  Left ear pressure  Balance problems  []    Noise exposure history  []    Previous ear surgeries  []    Family history of hearing loss  []    Amplification  []    Other  []      Otoscopy: Right ear: Clear external ear canal and notable landmarks visualized on the tympanic membrane. Left ear:  Clear external ear canal and notable landmarks visualized on the tympanic membrane.  Tympanometry: Right ear: Type C- Normal external ear canal volume with negative middle ear pressure and normal tympanic membrane compliance. Left ear: Type B- Normal external ear canal volume with no middle ear pressure peak or tympanic membrane compliance.    Pure tone Audiometry: Right ear- Normal to moderate essentially sensorineural hearing loss from 250 Hz - 8000 Hz. Air-bone gap at 4000 Hz.  Left ear-  Borderline normal yo moderately severe mixed hearing loss from 250 Hz - 8000 Hz.  Speech Audiometry: Right ear- Speech Reception Threshold (SRT) was obtained at 15 dBHL. Left ear-Speech Reception Threshold (SRT) was obtained at 15 dBHL.   Word Recognition Score Tested using NU-6 (recorded) Right ear: 100% was obtained at a presentation level of 60 dBHL with contralateral masking which is deemed as  excellent. Left ear: 100% was obtained at a presentation level of 60 dBHL with contralateral masking which is deemed as  excellent.   The hearing test results were completed under headphones and  results are deemed to be of good reliability. Test technique:  conventional      Recommendations: Follow up with ENT as scheduled for today. Repeat audiogram after medical care.   [name], AUD

## 2024-04-08 ENCOUNTER — Encounter: Payer: Self-pay | Admitting: Audiology

## 2024-04-25 ENCOUNTER — Other Ambulatory Visit: Payer: Self-pay | Admitting: Cardiology

## 2024-04-26 NOTE — Progress Notes (Signed)
 Cardiology Office Note:  .   Date:  04/30/2024  ID:  Marissa Lee, DOB 05-16-1951, MRN [id number] PCP: Allwardt, Mardy HERO, PA-C  [facility] Providers Cardiologist:  Gordy Bergamo, MD    History of Present Illness: .   Marissa Lee is a 73 y.o. female with history coronary disease SP CABG on 07/28/2021 for critical left main disease, HLD.coronary disease SP CABG on 07/28/2021 for critical left main disease  Cath below 03/2024 medical therapy.Patient complains of lethargy, fatigue, depressed. Hasn't been outside with the heat. Questions about meds-she takes randomly and different times every day. Has a lot of family stress and needs to see counseling or psychiatry. She hasn't done anything since that cath to cause her chest pain. She needs structure.   ROS:    Studies Reviewed: SABRA         Prior CV Studies:   Cath 03/27/24 Cardiac Catheterization 03/27/24: Hemodynamic data: LVEDP 13 mmHg.  There is no pressure gradient across the aortic valve.   Angiographic data: LM: Very large vessel however severely diseased with a long diffuse tubular 90% stenosis unchanged from prior cardiac catheterization.  Competitive filling of the SVG to OM and LIMA to LAD retrogradely was evident. LAD: Very large caliber vessel, it is smooth and normal except just prior to the LIMA insertion there is a 60% stenosis.  There is antegrade brisk flow through the LAD and retrogradely fills the LIMA. LCx: Very large, vessel, there is SVG to OM1 which retrogradely fills all the way back to the aorta.  Circumflex is normal. RCA: Large-caliber vessel, smooth and normal giving origin to large PDA and large PL branch. LIMA to LAD: Widely patent except at the insertion site there is a 30 to 40% stenosis. SVG to OM1: Widely patent.      Impression and recommendations: Although patient has very classic anginal symptoms, the LIMA to LAD appears to have a 40 to 60% stenosis at the insertion site however there is extensive  and brisk antegrade flow even through a high-grade left main stenosis in fact filling the SVG and LIMA graft retrogradely all the way back.  Hence patient is well protected both with antegrade flow through native vessel and also via grafts as well.  Hence would recommend continued medical therapy.  Will add amlodipine  5 mg daily.    Risk Assessment/Calculations:             Physical Exam:   VS:  BP 112/72   Pulse 60   Ht 5' 4 (1.626 m)   Wt 140 lb (63.5 kg)   LMP 09/27/2003   SpO2 90%   BMI 24.03 kg/m    Orhtostatics: No data found. Wt Readings from Last 3 Encounters:  04/30/24 140 lb (63.5 kg)  03/27/24 135 lb (61.2 kg)  03/18/24 137 lb (62.1 kg)    GEN: Well nourished, well developed in no acute distress NECK: No JVD; No carotid bruits CARDIAC:  RRR, no murmurs, rubs, gallops RESPIRATORY:  Clear to auscultation without rales, wheezing or rhonchi  ABDOMEN: Soft, non-tender, non-distended EXTREMITIES:  No edema; No deformity   ASSESSMENT AND PLAN: .    Coronary disease SP CABG on 07/28/2021 for critical left main disease, recurrent angina with cath 03/2024 above-medical therapy.  -have gone over her meds so she takes them at the same time daily -[facility] or [facility] -on Plavix  -lipitor , imdur , toprol   HLD on atorvastatin  and zetia   Depression-refer to [facility] counseling  Dispo: f/u in 4 months.  Signed, Olivia Pavy, PA-C

## 2024-04-30 ENCOUNTER — Encounter: Payer: Self-pay | Admitting: Physician Assistant

## 2024-04-30 ENCOUNTER — Ambulatory Visit: Attending: Physician Assistant | Admitting: Physician Assistant

## 2024-04-30 VITALS — BP 112/72 | HR 60 | Ht 64.0 in | Wt 140.0 lb

## 2024-04-30 DIAGNOSIS — F32A Depression, unspecified: Secondary | ICD-10-CM

## 2024-04-30 DIAGNOSIS — E782 Mixed hyperlipidemia: Secondary | ICD-10-CM | POA: Diagnosis not present

## 2024-04-30 DIAGNOSIS — I25118 Atherosclerotic heart disease of native coronary artery with other forms of angina pectoris: Secondary | ICD-10-CM | POA: Diagnosis not present

## 2024-04-30 DIAGNOSIS — F419 Anxiety disorder, unspecified: Secondary | ICD-10-CM

## 2024-04-30 NOTE — Patient Instructions (Signed)
 Medication Instructions:  Take Norvasc  and Imdur  in the morning Take Lipitor , Plavix , and Metoprolol  in the afternoon *If you need a refill on your cardiac medications before your next appointment, please call your pharmacy*  Lab Work: No labs  Testing/Procedures: No testing  Follow-Up: At [facility], you and your health needs are our priority.  As part of our continuing mission to provide you with exceptional heart care, our providers are all part of one team.  This team includes your primary Cardiologist (physician) and Advanced Practice Providers or APPs (Physician Assistants and Nurse Practitioners) who all work together to provide you with the care you need, when you need it.  Your next appointment:   6 month(s)  Provider:   Gordy Bergamo, MD    We recommend signing up for the patient portal called MyChart.  Sign up information is provided on this After Visit Summary.  MyChart is used to connect with patients for Virtual Visits (Telemedicine).  Patients are able to view lab/test results, encounter notes, upcoming appointments, etc.  Non-urgent messages can be sent to your provider as well.   To learn more about what you can do with MyChart, go to [email or web address].   Other Instructions We are going to refer you to psychiatry

## 2024-05-06 ENCOUNTER — Other Ambulatory Visit: Payer: Self-pay | Admitting: Physician Assistant

## 2024-05-17 ENCOUNTER — Other Ambulatory Visit: Payer: Self-pay | Admitting: Cardiology

## 2024-06-04 ENCOUNTER — Other Ambulatory Visit: Payer: Self-pay | Admitting: Physician Assistant

## 2024-06-04 ENCOUNTER — Telehealth (INDEPENDENT_AMBULATORY_CARE_PROVIDER_SITE_OTHER): Payer: Self-pay | Admitting: Otolaryngology

## 2024-06-04 NOTE — Telephone Encounter (Signed)
 LVM for patient to call back to reschedule 10/06 appointment - Dr. Tobie in surgery

## 2024-07-01 ENCOUNTER — Ambulatory Visit (INDEPENDENT_AMBULATORY_CARE_PROVIDER_SITE_OTHER): Admitting: Otolaryngology
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
## 2024-07-17 ENCOUNTER — Ambulatory Visit (HOSPITAL_COMMUNITY): Payer: Self-pay

## 2024-07-17 VITALS — BP 128/88 | HR 84 | Ht 64.0 in | Wt 140.0 lb

## 2024-07-17 DIAGNOSIS — F419 Anxiety disorder, unspecified: Secondary | ICD-10-CM | POA: Diagnosis not present

## 2024-07-17 DIAGNOSIS — F324 Major depressive disorder, single episode, in partial remission: Secondary | ICD-10-CM

## 2024-07-17 MED ORDER — FLUOXETINE HCL 40 MG PO CAPS: 40.0000 mg | ORAL_CAPSULE | Freq: Every day | ORAL | 0 refills | Status: DC

## 2024-07-17 NOTE — Addendum Note (Signed)
 Addended by: CARVIN CROCK on: 07/17/2024 01:53 PM   Modules accepted: Level of Service

## 2024-07-20 ENCOUNTER — Other Ambulatory Visit: Payer: Self-pay

## 2024-07-20 ENCOUNTER — Emergency Department (HOSPITAL_COMMUNITY)

## 2024-07-20 ENCOUNTER — Encounter (HOSPITAL_COMMUNITY): Payer: Self-pay

## 2024-07-20 ENCOUNTER — Telehealth: Payer: Self-pay

## 2024-07-20 ENCOUNTER — Emergency Department (HOSPITAL_COMMUNITY)
Admission: EM | Admit: 2024-07-20 | Discharge: 2024-07-20 | Disposition: A | Discharge status: Home / Self Care | Attending: Emergency Medicine | Admitting: Emergency Medicine

## 2024-07-20 DIAGNOSIS — Z7902 Long term (current) use of antithrombotics/antiplatelets: Secondary | ICD-10-CM | POA: Diagnosis not present

## 2024-07-20 DIAGNOSIS — R001 Bradycardia, unspecified: Secondary | ICD-10-CM | POA: Insufficient documentation

## 2024-07-20 DIAGNOSIS — S52612A Displaced fracture of left ulna styloid process, initial encounter for closed fracture: Secondary | ICD-10-CM | POA: Insufficient documentation

## 2024-07-20 DIAGNOSIS — W010XXA Fall on same level from slipping, tripping and stumbling without subsequent striking against object, initial encounter: Secondary | ICD-10-CM | POA: Diagnosis not present

## 2024-07-20 DIAGNOSIS — Z79899 Other long term (current) drug therapy: Secondary | ICD-10-CM | POA: Insufficient documentation

## 2024-07-20 DIAGNOSIS — S59912A Unspecified injury of left forearm, initial encounter: Secondary | ICD-10-CM | POA: Diagnosis present

## 2024-07-20 DIAGNOSIS — M25551 Pain in right hip: Secondary | ICD-10-CM | POA: Insufficient documentation

## 2024-07-20 DIAGNOSIS — S52552A Other extraarticular fracture of lower end of left radius, initial encounter for closed fracture: Secondary | ICD-10-CM | POA: Diagnosis not present

## 2024-07-20 MED ORDER — FENTANYL CITRATE (PF) 50 MCG/ML IJ SOSY
50.0000 ug | PREFILLED_SYRINGE | Freq: Once | INTRAMUSCULAR | Status: DC
  Filled 2024-07-20: qty 1

## 2024-07-20 MED ORDER — FENTANYL CITRATE (PF) 50 MCG/ML IJ SOSY
75.0000 ug | PREFILLED_SYRINGE | Freq: Once | INTRAMUSCULAR | Status: AC
  Administered 2024-07-20: 75 ug via INTRAVENOUS
  Filled 2024-07-20: qty 2

## 2024-07-20 MED ORDER — FENTANYL CITRATE (PF) 50 MCG/ML IJ SOSY
50.0000 ug | PREFILLED_SYRINGE | Freq: Once | INTRAMUSCULAR | Status: AC
  Administered 2024-07-20: 50 ug via INTRAVENOUS

## 2024-07-20 MED ORDER — HYDROMORPHONE HCL 1 MG/ML IJ SOLN
0.5000 mg | Freq: Once | INTRAMUSCULAR | Status: AC
  Administered 2024-07-20: 0.5 mg via INTRAVENOUS
  Filled 2024-07-20: qty 1

## 2024-07-20 MED ORDER — BUPIVACAINE HCL 0.5 % IJ SOLN
20.0000 mL | Freq: Once | INTRAMUSCULAR | Status: AC
  Administered 2024-07-20: 20 mL
  Filled 2024-07-20: qty 50

## 2024-07-20 MED ORDER — OXYCODONE HCL 5 MG PO TABS: 5.0000 mg | ORAL_TABLET | ORAL | 0 refills | Status: DC | PRN

## 2024-07-20 MED ORDER — SODIUM CHLORIDE 0.9 % IV BOLUS
1000.0000 mL | Freq: Once | INTRAVENOUS | Status: AC
  Administered 2024-07-20: 1000 mL via INTRAVENOUS

## 2024-07-20 NOTE — ED Notes (Signed)
 Ortho tech at bedside to splint left wrist.

## 2024-07-20 NOTE — Progress Notes (Signed)
 Orthopedic Tech Progress Note Patient Details:  Marissa Lee October 05, 1950 [id number] Applied finger traps, followed by application of volar splint per order.  Ortho Devices Type of Ortho Device: Ace wrap, Cotton web roll, Finger trap, Volar splint Finger Trap Weight: 6lb Ortho Device/Splint Location: LUE Ortho Device/Splint Interventions: Ordered, Application, Adjustment   Post Interventions Patient Tolerated: Well Instructions Provided: Adjustment of device, Care of device, Poper ambulation with device  Marissa Lee 07/20/2024, 2:26 PM

## 2024-07-20 NOTE — ED Notes (Addendum)
 This RN at bedside with PA Torrence and MD Prosperi for reduction of left wrist. Pt tolerated well.

## 2024-07-20 NOTE — ED Provider Notes (Signed)
 [name] [facility] AT [facility] Provider Note   CSN: [id number] Arrival date & time: 07/20/24  1019     Patient presents with: Marissa Lee is a 73 y.o. female with past medical history of hyperlipidemia, low back pain, osteopenia who presents emergency department for evaluation after a fall.  Patient states that she was watching her grandchildren when she stepped on a stepstool to get a vase and reportedly fell.  Patient reports she tried to catch herself and landed on her left arm and right hip.  She currently complains of left elbow and arm pain as well as right hip pain.  Per triage note, she was given fentanyl  and route.  Patient denies LOC, did not hit her head.  She takes Plavix .  She is unsure if her last dose was this morning or last night.   Fall       Prior to Admission medications   Medication Sig Start Date End Date Taking? Authorizing Provider  amLODipine  (NORVASC ) 5 MG tablet TAKE 1 TABLET(5 MG) BY MOUTH DAILY 05/17/24   Ladona Heinz, MD  Apoaequorin (PREVAGEN PO) Take 1 tablet by mouth daily at 2 am.    [provider]  atorvastatin  (LIPITOR ) 40 MG tablet Take 1 tablet (40 mg total) by mouth daily. 03/15/24   Ladona Heinz, MD  azelastine  (ASTELIN ) 0.1 % nasal spray Place 2 sprays into both nostrils 2 (two) times daily. Use in each nostril as directed Patient taking differently: Place 2 sprays into both nostrils 2 (two) times daily as needed. Use in each nostril as directed 03/01/24   Tobie Eldora NOVAK, MD  Calcium -Phosphorus-Vitamin D  (CITRACAL +D3 PO) Take 1 tablet by mouth daily. Gummy    [provider]  clopidogrel  (PLAVIX ) 75 MG tablet TAKE 1 TABLET(75 MG) BY MOUTH DAILY 04/25/24   Ladona Heinz, MD  Dextrose , Diabetic Use, (GLUCOSE PO) Take 1 tablet by mouth daily as needed (hypoglycemic episodes).    [provider]  FLUoxetine  (PROZAC ) 10 MG capsule TAKE 1 CAPSULE BY MOUTH EVERY DAY 09/18/23   Allwardt, Alyssa M, PA-C   FLUoxetine  (PROZAC ) 40 MG capsule Take 1 capsule (40 mg total) by mouth daily. 07/17/24   Kapoor, Sahil, MD  fluticasone  (FLONASE ) 50 MCG/ACT nasal spray Place 2 sprays into both nostrils in the morning and at bedtime. Patient taking differently: Place 2 sprays into both nostrils 2 (two) times daily as needed for allergies or rhinitis. 03/01/24 04/30/24  Patel, Kunjan B, MD  isosorbide  mononitrate (IMDUR ) 60 MG 24 hr tablet Take 1 tablet (60 mg total) by mouth daily. 10/18/23   Jeffrie Oneil BROCKS, MD  metoprolol  succinate (TOPROL -XL) 25 MG 24 hr tablet TAKE 1/2 TABLET(12.5 MG) BY MOUTH DAILY 01/04/24   Ladona Heinz, MD  nitroGLYCERIN  (NITROSTAT ) 0.4 MG SL tablet PLACE 1 TABLET UNDER THE TONGUE EVERY 5 MINUTES AS NEEDED FOR CHEST PAIN 03/12/24   Ladona Heinz, MD  pantoprazole  (PROTONIX ) 40 MG tablet TAKE 1 TABLET(40 MG) BY MOUTH EVERY DAY 02/08/24   Ladona Heinz, MD    Allergies: Cephalosporins, Clindamycin /lincomycin, Codeine, Lisinopril , Diflucan  [fluconazole ], and Promethazine     Review of Systems  Musculoskeletal:  Positive for joint swelling.    Updated Vital Signs BP (!) 107/56   Pulse (!) 59   Temp 98 F (36.7 C) (Oral)   Resp 17   Ht 5' 4 (1.626 m)   Wt 63 kg   LMP 09/27/2003   SpO2 99%   BMI 23.84 kg/m  Physical Exam Vitals and nursing note reviewed.  Constitutional:      Appearance: Normal appearance. She is not ill-appearing.  Eyes:     General: No scleral icterus. Cardiovascular:     Rate and Rhythm: Bradycardia present.  Pulmonary:     Effort: Pulmonary effort is normal. No respiratory distress.  Musculoskeletal:        General: Tenderness present. No deformity.     Comments: Left arm placed in a splint by EMS.  Patient endorses left shoulder, left elbow, left arm pain.  2+ radial pulse intact.  Sensation intact.  Patient able to move all fingers.  There is some evidence of dried blood on her left ring finger.  I did not test range of motion at this time because I am waiting for  x-rays.  Patient's arm remains in splint at this time.  Skin:    Coloration: Skin is not jaundiced.  Neurological:     General: No focal deficit present.     Mental Status: She is alert.  Psychiatric:        Mood and Affect: Mood normal.     (all labs ordered are listed, but only abnormal results are displayed) Labs Reviewed - No data to display  EKG: EKG Interpretation Date/Time:  Saturday July 20 2024 10:27:08 EDT Ventricular Rate:  54 PR Interval:  179 QRS Duration:  93 QT Interval:  450 QTC Calculation: 427 R Axis:   10  Text Interpretation: Sinus rhythm Confirmed by Francesca Fallow ([id number]) on 07/20/2024 11:07:52 AM  Radiology: ARCOLA Shoulder Left Result Date: 07/20/2024 CLINICAL DATA:  Clemens.  Left shoulder pain. EXAM: LEFT SHOULDER - 2+ VIEW COMPARISON:  None Available. FINDINGS: Examination limited by significant clothing artifact The glenohumeral and AC joints are maintained. No acute fracture. The visualized upper left ribs are intact. No pneumothorax. IMPRESSION: No fracture or dislocation. Electronically Signed   By: MYRTIS Stammer M.D.   On: 07/20/2024 11:53   DG Elbow Complete Left Result Date: 07/20/2024 EXAM: 3 VIEW(S) XRAY OF THE LEFT ELBOW COMPARISON: None available. CLINICAL HISTORY: Fall. Reason for exam: fall; Triage notes: Pt to ED via EMS with c/o fall this am. Pt was standing on stool in kitchen, lost balance and fell. +plavix . -LOC, denies hitting head. Pt c/o pain to left wrist/elbow. Pt arrives in splint to left lower arm. +PMS to left upper extremity. ; Pt also c/o pain to right hip. Pt A\T\Ox4. No uncontrolled bleeding noted. FINDINGS: BONES AND JOINTS: Normal. SOFT TISSUES: The soft tissues are unremarkable. IMPRESSION: 1. No acute abnormality of the elbow. Electronically signed by: Waddell Calk MD 07/20/2024 11:51 AM EDT RP Workstation: [id number]   DG Hip [place] View W or [place] Pelvis 1 View Right Result Date: 07/20/2024 EXAM: 1 VIEW(S) XRAY OF THE  RIGHT HIP 07/20/2024 11:29:00 AM COMPARISON: None available. CLINICAL HISTORY: fall. Reason for exam: fall; Triage notes: Pt to ED via EMS with c/o fall this am. Pt was standing on stool in kitchen, lost balance and fell. +plavix . -LOC, denies hitting head. Pt c/o pain to left wrist/elbow. Pt arrives in splint to left lower arm. +PMS to left upper extremity. ; Pt also c/o pain to right hip. Pt A\T\Ox4. No uncontrolled bleeding noted. FINDINGS: BONES AND JOINTS: No acute fracture or focal osseous lesion. The hip joint is maintained. No significant degenerative changes. SOFT TISSUES: Surgical clip in left upper leg. IMPRESSION: 1. No acute fracture or dislocation of the right hip or visualized pelvis. Electronically signed by: Waddell  Stroud MD 07/20/2024 11:51 AM EDT RP Workstation: [id number]   DG Wrist Complete Left Result Date: 07/20/2024 EXAM: 3 OR MORE VIEW(S) XRAY OF THE LEFT WRIST 07/20/2024 11:29:00 AM COMPARISON: None available. CLINICAL HISTORY: fall. Reason for exam: fall; Triage notes: Pt to ED via EMS with c/o fall this am. Pt was standing on stool in kitchen, lost balance and fell. +plavix . -LOC, denies hitting head. Pt c/o pain to left wrist/elbow. Pt arrives in splint to left lower arm. +PMS to left upper extremity. ; Pt also c/o pain to right hip. Pt A\T\Ox4. No uncontrolled bleeding noted. FINDINGS: BONES AND JOINTS: Intra-articular impacted fracture of distal radius. Dorsal impaction with dorsal angulation of distal radial articular surface measuring 30 degrees. Displaced ulnar styloid fracture. No joint dislocation. SOFT TISSUES: Soft tissue swelling. IMPRESSION: 1. Intra-articular impacted distal radius fracture with dorsal impaction and approximately 30 degrees of dorsal angulation of the distal radial articular surface. 2. Displaced ulnar styloid fracture. Electronically signed by: Waddell Calk MD 07/20/2024 11:50 AM EDT RP Workstation: [id number]    .Reduction of fracture  Date/Time:  07/20/2024 2:18 PM  Performed by: Torrence Marry RAMAN, PA-C Authorized by: Torrence Marry RAMAN, PA-C  Consent: Verbal consent obtained. Written consent not obtained Risks and benefits: risks, benefits and alternatives were discussed Consent given by: patient Patient understanding: patient states understanding of the procedure being performed Site marked: the operative site was marked Patient identity confirmed: verbally with patient Local anesthesia used: yes Anesthesia: hematoma block  Anesthesia: Local anesthesia used: yes Local Anesthetic: bupivacaine  0.5% without epinephrine  Anesthetic total: 10 mL  Sedation: Patient sedated: no  Patient tolerance: patient tolerated the procedure well with no immediate complications      Medications Ordered in the ED  fentaNYL  (SUBLIMAZE ) injection 50 mcg (50 mcg Intravenous Given 07/20/24 1044)  fentaNYL  (SUBLIMAZE ) injection 75 mcg (75 mcg Intravenous Given 07/20/24 1211)  HYDROmorphone (DILAUDID) injection 0.5 mg (0.5 mg Intravenous Given 07/20/24 1324)  bupivacaine  (MARCAINE ) 0.5 % (with pres) injection 20 mL (20 mLs Infiltration Given by Other 07/20/24 1328)  sodium chloride  0.9 % bolus 1,000 mL (0 mLs Intravenous Stopped 07/20/24 1401)                                   Medical Decision Making Amount and/or Complexity of Data Reviewed Radiology: ordered.  Risk Prescription drug management.   This patient presents to the ED for concern of left arm and hip pain after a fall, this involves an extensive number of treatment options, and is a complaint that carries with it a high risk of complications and morbidity.  Differential diagnosis includes: Radial fracture, ulnar fracture, radial/ulnar dislocation, elbow dislocation, humerus fracture, compartment syndrome, hip fracture, pelvic fracture, hip dislocation  Co morbidities:  history of osteopenia   Lab Tests:  Labs not indicated  Imaging Studies:  I ordered imaging  studies including left shoulder, elbow, wrist x-ray, right hip x-ray I independently visualized and interpreted imaging which showed   1. Intra-articular impacted distal radius fracture with dorsal impaction and  approximately 30 degrees of dorsal angulation of the distal radial articular  surface.  2. Displaced ulnar styloid fracture.  No additional fractures or dislocations noted on x-ray. I agree with the radiologist interpretation  Cardiac Monitoring/ECG:  The patient was maintained on a cardiac monitor.  I personally viewed and interpreted the cardiac monitored which showed an underlying rhythm of: Sinus bradycardia  Medicines ordered  and prescription drug management:  I ordered medication including  Medications  fentaNYL  (SUBLIMAZE ) injection 50 mcg (50 mcg Intravenous Given 07/20/24 1044)  fentaNYL  (SUBLIMAZE ) injection 75 mcg (75 mcg Intravenous Given 07/20/24 1211)  HYDROmorphone (DILAUDID) injection 0.5 mg (0.5 mg Intravenous Given 07/20/24 1324)  bupivacaine  (MARCAINE ) 0.5 % (with pres) injection 20 mL (20 mLs Infiltration Given by Other 07/20/24 1328)  sodium chloride  0.9 % bolus 1,000 mL (0 mLs Intravenous Stopped 07/20/24 1401)   for pain control Reevaluation of the patient after these medicines showed that the patient improved I have reviewed the patients home medicines and have made adjustments as needed  Test Considered:   none  Critical Interventions:   none  Consultations Obtained: - ortho  Problem List / ED Course:     ICD-10-CM   1. Other closed extra-articular fracture of distal end of left radius, initial encounter  S52.552A     2. Closed displaced fracture of styloid process of left ulna, initial encounter  S52.612A       MDM: 73 year old female who presents emergency department for evaluation after a fall.  Patient was on a stepstool this morning when she fell on her left forearm and right hip.  She is currently complaining of left elbow,  forearm pain as well as right hip pain.  No LOC.  She did not hit her head.  Patient is on Plavix .  Last dose was either last night or this morning.  Patient was given 50 of fentanyl  and route via EMS.  Upon arrival, patient continues to be in pain.  Additional dose of fentanyl  given.  Left shoulder, elbow, wrist x-ray ordered.  Hip x-ray ordered.  Left wrist with intra-articular impacted distal radius fracture with dorsal impaction and displaced ulnar styloid fracture.  No other fractures noted on other x-rays.  Ortho consult placed.  Per Ortho, recommendation for attempt at reduction in the ED and outpatient follow-up with hand surgery.  I ordered bupivacaine  for a hematoma block.  Patient's blood pressure also 97/47 so IV fluids ordered.  Repeat blood pressure shows 107/56.  2:17 PM Patient has IV fluids running and bupivacaine  ordered and prepped in typical sterile fashion.  Radius palpated and I administered approximately 10 cc of bupivacaine  aimed directly at the radial head.  At the same time, RN administered 0.5 mg Dilaudid for additional pain control.  Reduction was attempted via finger traps.  Patient remained on finger traps for approximately 10 minutes with some improvement.  Orthotec at bedside who placed patient in a short arm splint.  I explained patient's need to follow-up with Ortho next week for further management and possible surgery.  I am prescribing her with oxycodone  for pain management and encouraged her to use Tylenol /ibuprofen  as well.  Patient verbalizes understanding.  Vital signs are stable.  Patient stable for discharge at this time.    Dispostion:  After consideration of the diagnostic results and the patients response to treatment, I feel that the patient would benefit from outpatient orthopedic follow-up.    Final diagnoses:  Other closed extra-articular fracture of distal end of left radius, initial encounter  Closed displaced fracture of styloid process of left ulna,  initial encounter    ED Discharge Orders     None          Torrence Marry RAMAN, PA-C 07/20/24 1500    Francesca Elsie CROME, MD 07/21/24 [phone]

## 2024-07-20 NOTE — Telephone Encounter (Signed)
 Daughter called in, the pharmacy that the medicaiton was sent to is closed, they would like the medication sent to [facility] on [address]. Message sent to provider to ask to change medication

## 2024-07-20 NOTE — Discharge Instructions (Addendum)
 It was a pleasure taking care of you today. You were seen in the Emergency Department for left arm pain after a fall. Your work-up was reassuring. Your x-ray shows a complex radial fracture.  We have attempted to reduce the fracture in the emergency department and have placed you in a temporary splint.  You will need to follow-up with our on-call Ortho team early next week.  I have provided their contact information above.  They will advise you on next steps, which may include surgery. Refer to the attached documentation for further management of your symptoms.  I am also sending you home with a prescription for oxycodone .  You can take this as needed for severe pain, but Tylenol /ibuprofen  should be your first-line.    Please return to the ER if you experience chest pain, trouble breathing, intractable nausea/vomiting or any other life threatening illnesses.

## 2024-07-20 NOTE — ED Triage Notes (Signed)
 Pt to ED via EMS with c/o fall this am. Pt was standing on stool in kitchen, lost balance and fell. +plavix . -LOC, denies hitting head. Pt c/o pain to left wrist/elbow. Pt arrives in splint to left lower arm. +PMS to left upper extremity. Pt also c/o pain to right hip. Pt A&Ox4. No uncontrolled bleeding noted.

## 2024-07-24 ENCOUNTER — Inpatient Hospital Stay: Admitting: Family Medicine

## 2024-07-26 ENCOUNTER — Other Ambulatory Visit: Payer: Self-pay

## 2024-07-26 ENCOUNTER — Encounter (HOSPITAL_BASED_OUTPATIENT_CLINIC_OR_DEPARTMENT_OTHER): Payer: Self-pay | Admitting: Orthopedic Surgery

## 2024-07-26 ENCOUNTER — Telehealth: Payer: Self-pay | Admitting: Cardiology

## 2024-07-26 ENCOUNTER — Telehealth: Payer: Self-pay

## 2024-07-26 ENCOUNTER — Other Ambulatory Visit: Payer: Self-pay | Admitting: Orthopedic Surgery

## 2024-07-26 NOTE — Telephone Encounter (Signed)
  Patient Consent for Virtual Visit        Marissa Lee has provided verbal consent on 07/26/2024 for a virtual visit (video or telephone).   CONSENT FOR VIRTUAL VISIT FOR:  Marissa Lee  By participating in this virtual visit I agree to the following:  I hereby voluntarily request, consent and authorize [facility] and its employed or contracted physicians, physician assistants, nurse practitioners or other licensed health care professionals (the Practitioner), to provide me with telemedicine health care services (the "Services) as deemed necessary by the treating Practitioner. I acknowledge and consent to receive the Services by the Practitioner via telemedicine. I understand that the telemedicine visit will involve communicating with the Practitioner through live audiovisual communication technology and the disclosure of certain medical information by electronic transmission. I acknowledge that I have been given the opportunity to request an in-person assessment or other available alternative prior to the telemedicine visit and am voluntarily participating in the telemedicine visit.  I understand that I have the right to withhold or withdraw my consent to the use of telemedicine in the course of my care at any time, without affecting my right to future care or treatment, and that the Practitioner or I may terminate the telemedicine visit at any time. I understand that I have the right to inspect all information obtained and/or recorded in the course of the telemedicine visit and may receive copies of available information for a reasonable fee.  I understand that some of the potential risks of receiving the Services via telemedicine include:  Delay or interruption in medical evaluation due to technological equipment failure or disruption; Information transmitted may not be sufficient (e.g. poor resolution of images) to allow for appropriate medical decision making by the Practitioner;  and/or  In rare instances, security protocols could fail, causing a breach of personal health information.  Furthermore, I acknowledge that it is my responsibility to provide information about my medical history, conditions and care that is complete and accurate to the best of my ability. I acknowledge that Practitioner's advice, recommendations, and/or decision may be based on factors not within their control, such as incomplete or inaccurate data provided by me or distortions of diagnostic images or specimens that may result from electronic transmissions. I understand that the practice of medicine is not an exact science and that Practitioner makes no warranties or guarantees regarding treatment outcomes. I acknowledge that a copy of this consent can be made available to me via my patient portal [facility] MyChart), or I can request a printed copy by calling the office of [facility].    I understand that my insurance will be billed for this visit.   I have read or had this consent read to me. I understand the contents of this consent, which adequately explains the benefits and risks of the Services being provided via telemedicine.  I have been provided ample opportunity to ask questions regarding this consent and the Services and have had my questions answered to my satisfaction. I give my informed consent for the services to be provided through the use of telemedicine in my medical care

## 2024-07-26 NOTE — Telephone Encounter (Signed)
   Name: Marissa Lee  DOB: [birth year]  MRN: [id number]  Primary Cardiologist: Gordy Bergamo, MD   Preoperative team, please contact this patient and set up a phone call appointment for further preoperative risk assessment. Please obtain consent and complete medication review. Thank you for your help.  I confirm that guidance regarding antiplatelet and oral anticoagulation therapy has been completed and, if necessary, noted below.  Awaiting response from Dr. Bergamo regarding Plavix  hold. Given the urgent nature of this surgery and upcoming surgical date of 08/01/2024, requesting VV to be scheduled for early next week, will go ahead and route this to the callback pool to work on scheduling.   I also confirmed the patient resides in the state of  . As per [facility] Medical Board telemedicine laws, the patient must reside in the state in which the provider is licensed.   Ameilia Rattan E Gentry Pilson, NP 07/26/2024, 12:49 PM [facility]

## 2024-07-26 NOTE — Telephone Encounter (Signed)
 Dr. 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 Recent LHC on 03/27/2024 showed:  Angiographic data: LM: Very large vessel however severely diseased with a long diffuse tubular 90% stenosis unchanged from prior cardiac catheterization.  Competitive filling of the SVG to OM and LIMA to LAD retrogradely was evident. LAD: Very large caliber vessel, it is smooth and normal except just prior to the LIMA insertion there is a 60% stenosis.  There is antegrade brisk flow through the LAD and retrogradely fills the LIMA. LCx: Very large, vessel, there is SVG to OM1 which retrogradely fills all the way back to the aorta.  Circumflex is normal. RCA: Large-caliber vessel, smooth and normal giving origin to large PDA and large PL branch. LIMA to LAD: Widely patent except at the insertion site there is a 30 to 40% stenosis. SVG to OM1: Widely patent. Impression and recommendations: Although patient has very classic anginal symptoms, the LIMA to LAD appears to have a 40 to 60% stenosis at the insertion site however there is extensive and brisk antegrade flow even through a high-grade left main stenosis in fact filling the SVG and LIMA graft retrogradely all the way back.  Hence patient is well protected both with antegrade flow through native vessel and also via grafts as well.  Hence would recommend continued medical therapy.  Will add amlodipine  5 mg daily.   Considering this,  Could you advise on patient's plavix  hold for upcoming procedure?

## 2024-07-26 NOTE — Telephone Encounter (Signed)
   Pre-operative Risk Assessment    Patient Name: Marissa Lee  DOB: 1950-10-21 MRN: [id number]   Date of last office visit: 04/30/24 Date of next office visit: Not yet scheduled   Request for Surgical Clearance    Procedure:  Left Distal Radius Open Reduction Internal Fixation  Date of Surgery:  Clearance 08/01/24                                Surgeon:  Dr. Franky Curia Surgeon's Group or Practice Name:  [facility] Phone number:  [phone]  Fax number:  [phone]   Type of Clearance Requested:   - Medical  - Pharmacy:  Hold Clopidogrel  (Plavix )     Type of Anesthesia:  Choice - Ancillary Block   Additional requests/questions:  Caller Mar) stated they will need to know when to stop patient's Plavix .    Signed, Herma KATHEE Blush   07/26/2024, 11:08 AM

## 2024-07-26 NOTE — Telephone Encounter (Signed)
 Preop tele appt now scheduled, med rec and consent done.

## 2024-07-29 ENCOUNTER — Ambulatory Visit: Attending: Nurse Practitioner | Admitting: Cardiology

## 2024-07-29 DIAGNOSIS — Z01810 Encounter for preprocedural cardiovascular examination: Secondary | ICD-10-CM | POA: Diagnosis not present

## 2024-07-29 DIAGNOSIS — Z01818 Encounter for other preprocedural examination: Secondary | ICD-10-CM

## 2024-07-29 NOTE — Progress Notes (Signed)
 Virtual Visit via Telephone Note   Because of Marissa Lee co-morbid illnesses, she is at least at moderate risk for complications without adequate follow up.  This format is felt to be most appropriate for this patient at this time.  Due to technical limitations with video connection (technology), today's appointment will be conducted as an audio only telehealth visit, and Marissa Lee verbally agreed to proceed in this manner.   All issues noted in this document were discussed and addressed.  No physical exam could be performed with this format.  Evaluation Performed:  Preoperative cardiovascular risk assessment _____________   Date:  07/29/2024   Patient ID:  Marissa Lee, DOB [birth year], MRN [id number] Patient Location:  Home Provider location:   Office  Primary Care Provider:  Allwardt, Mardy HERO, PA-C Primary Cardiologist:  Gordy Bergamo, MD  Chief Complaint / Patient Profile   73 y.o. y/o female with a h/o CAD s/p CABG in 2022 >> LHC 03/2024 40-60% stenosis at insertion site but protected retrogradely and antegradely, dyslipidemia who is pending left distal radius open reduction internal fixation and presents today for telephonic preoperative cardiovascular risk assessment.  History of Present Illness    Marissa Lee is a 73 y.o. female who presents via audio/video conferencing for a telehealth visit today.  Pt was last seen in cardiology clinic on 04/30/2024 by Olivia Pavy, PA.  At that time Marissa Lee was doing stable following left heart cath a month for her which was overall reassuring, she was dealing with some depression and was subsequently referred to counseling for this.  The patient is now pending procedure as outlined above. Since her last visit, she unfortunately suffered a fall as she was climbing on top of a stool and fractured her wrist. Otherwise, she stays active, very involved in the care of four of her grandchildren and stays busy with them but does not necessarily  participate in any formal exercise. She denies chest pain, palpitations, dyspnea, pnd, orthopnea, n, v, dizziness, syncope, edema, weight gain, or early satiety.       Past Medical History    Past Medical History:  Diagnosis Date   Anxiety    CAD (coronary artery disease), autologous vein bypass graft    Endometriosis    Fibrocystic breast    muliple drainage   Fibroid    GERD (gastroesophageal reflux disease)    Hiatal hernia    History of bronchitis    Hypercholesteremia 2007   Melanoma of toe, 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/2022   Osteopenia 08/2016   hip and spine   Postmenopausal state 06/05/2013   Subclinical hypothyroidism 10/04/2018   Urge incontinence 06/05/2013   Past Surgical History:  Procedure Laterality Date   ABDOMINAL HYSTERECTOMY     BLADDER SUSPENSION  2004   done with [facility]   BREAST BIOPSY  1980   done 3x   CLOSED REDUCTION METACARPAL WITH PERCUTANEOUS PINNING Right 08/24/2016   Procedure: CLOSED REDUCTION  WITH PERCUTANEOUS PINNING;  Surgeon: Prentice Pagan, MD;  Location: [facility] OR;  Service: Orthopedics;  Laterality: Right;   COLONOSCOPY     COMBINED HYSTERECTOMY VAGINAL / OOPHORECTOMY / A&P REPAIR  2004   CORONARY ARTERY BYPASS GRAFT N/A 07/28/2021   Procedure: CORONARY ARTERY BYPASS GRAFTING (CABG) times two using left internal mammary artery and  left leg saphenous vein;  Surgeon: Kerrin Elspeth BROCKS, MD;  Location: [facility] OR;  Service: Open Heart Surgery;  Laterality: N/A;   FOOT SURGERY Right 1992   IABP INSERTION Right 07/28/2021  Procedure: IABP Insertion;  Surgeon: Anner Alm ORN, MD;  Location: [facility] INVASIVE CV LAB;  Service: Cardiovascular;  Laterality: Right;   LEFT HEART CATH AND CORONARY ANGIOGRAPHY N/A 07/28/2021   Procedure: LEFT HEART CATH AND CORONARY ANGIOGRAPHY;  Surgeon: Anner Alm ORN, MD;  Location: [facility] INVASIVE CV LAB;  Service: Cardiovascular;  Laterality: N/A;   LEFT HEART CATH AND CORS/GRAFTS ANGIOGRAPHY N/A 03/27/2024   Procedure: LEFT HEART CATH AND  CORS/GRAFTS ANGIOGRAPHY;  Surgeon: Ladona Heinz, MD;  Location: [facility] INVASIVE CV LAB;  Service: Cardiovascular;  Laterality: N/A;   OPEN REDUCTION INTERNAL FIXATION (ORIF) HAND Right 08/24/2016   Procedure: RIGHT HAND LONG RING AND SMALL FINGER  POSSIBLE ORIF;  Surgeon: Prentice Pagan, MD;  Location: [facility] OR;  Service: Orthopedics;  Laterality: Right;   WISDOM TOOTH EXTRACTION  1978    Allergies  Allergies  Allergen Reactions   Cephalosporins Anaphylaxis and Swelling    Throat swells   Clindamycin /Lincomycin     Metallic taste, blisters behind ears   Codeine Itching   Lisinopril  Cough   Diflucan  [Fluconazole ] Rash   Promethazine  Rash    Home Medications    Prior to Admission medications   Medication Sig Start Date End Date Taking? Authorizing Provider  amLODipine  (NORVASC ) 5 MG tablet TAKE 1 TABLET(5 MG) BY MOUTH DAILY 05/17/24   Ladona Heinz, MD  Apoaequorin (PREVAGEN PO) Take 1 tablet by mouth daily at 2 am.    [provider]  atorvastatin  (LIPITOR ) 40 MG tablet Take 1 tablet (40 mg total) by mouth daily. 03/15/24   Ladona Heinz, MD  azelastine  (ASTELIN ) 0.1 % nasal spray Place 2 sprays into both nostrils 2 (two) times daily. Use in each nostril as directed Patient taking differently: Place 2 sprays into both nostrils 2 (two) times daily as needed. Use in each nostril as directed 03/01/24   Tobie Eldora NOVAK, MD  Calcium -Phosphorus-Vitamin D  (CITRACAL +D3 PO) Take 1 tablet by mouth daily. Gummy    [provider]  clopidogrel  (PLAVIX ) 75 MG tablet TAKE 1 TABLET(75 MG) BY MOUTH DAILY 04/25/24   Ladona Heinz, MD  Dextrose , Diabetic Use, (GLUCOSE PO) Take 1 tablet by mouth daily as needed (hypoglycemic episodes).    [provider]  FLUoxetine  (PROZAC ) 40 MG capsule Take 1 capsule (40 mg total) by mouth daily. 07/17/24   Kapoor, Sahil, MD  isosorbide  mononitrate (IMDUR ) 60 MG 24 hr tablet Take 1 tablet (60 mg total) by mouth daily. 10/18/23   Jeffrie Oneil BROCKS, MD  metoprolol   succinate (TOPROL -XL) 25 MG 24 hr tablet TAKE 1/2 TABLET(12.5 MG) BY MOUTH DAILY 01/04/24   Ladona Heinz, MD  nitroGLYCERIN  (NITROSTAT ) 0.4 MG SL tablet PLACE 1 TABLET UNDER THE TONGUE EVERY 5 MINUTES AS NEEDED FOR CHEST PAIN 03/12/24   Ladona Heinz, MD  oxyCODONE  (ROXICODONE ) 5 MG immediate release tablet Take 1 tablet (5 mg total) by mouth every 4 (four) hours as needed for up to 10 doses for severe pain (pain score 7-10). 07/20/24   Francesca Elsie CROME, MD  pantoprazole  (PROTONIX ) 40 MG tablet TAKE 1 TABLET(40 MG) BY MOUTH EVERY DAY 02/08/24   Ladona Heinz, MD    Physical Exam    Vital Signs:  Amberleigh Gerken does not have vital signs available for review today.  Given telephonic nature of communication, physical exam is limited. AAOx3. NAD. Normal affect.  Speech and respirations are unlabored.  Accessory Clinical Findings    None  Assessment & Plan    1.  Preoperative Cardiovascular Risk  Assessment: According to the Revised Cardiac Risk Index (RCRI), her Perioperative Risk of Major Cardiac Event is (%): 0.9 Her Functional Capacity in METs is: 5.07 according to the Duke Activity Status Index (DASI). Therefore, based on ACC/AHA guidelines, patient would be at acceptable risk for the planned procedure without further cardiovascular testing. I will route this recommendation to the requesting party via Epic fax function.   The patient was advised that if she develops new symptoms prior to surgery to contact our office to arrange for a follow-up visit, and she verbalized understanding.  Regarding her Plavix , she can hold this for 5 days however, her primary cardiologist Dr. Ladona would prefer her to be on aspirin  81 mg throughout the perioperative process.  She has already been holding her Plavix , she will start aspirin  81 mg daily today and continue 3 days postoperatively until 11/9 >>  transition back to Plavix  75 mg daily on 08/05/2024.   A copy of this note will be routed to requesting  surgeon.  Time:   Today, I have spent 10 minutes with the patient with telehealth technology discussing medical history, symptoms, and management plan.     Delon JAYSON Hoover, NP  07/29/2024, 1:32 PM

## 2024-07-29 NOTE — Telephone Encounter (Signed)
 I s/w the pt and she is agreeable to do tele appt today, per preop APP to move tele to today due to ASA recommendations.   I have cancelled the tele preop appt 07/30/24.

## 2024-07-30 ENCOUNTER — Ambulatory Visit
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
## 2024-08-01 ENCOUNTER — Ambulatory Visit (HOSPITAL_BASED_OUTPATIENT_CLINIC_OR_DEPARTMENT_OTHER): Admitting: Anesthesiology

## 2024-08-01 ENCOUNTER — Encounter (HOSPITAL_BASED_OUTPATIENT_CLINIC_OR_DEPARTMENT_OTHER)
Admission: RE | Disposition: A | Discharge status: Home / Self Care | Payer: Self-pay | Source: Home / Self Care | Attending: Orthopedic Surgery

## 2024-08-01 ENCOUNTER — Other Ambulatory Visit: Payer: Self-pay

## 2024-08-01 ENCOUNTER — Ambulatory Visit (HOSPITAL_BASED_OUTPATIENT_CLINIC_OR_DEPARTMENT_OTHER)

## 2024-08-01 ENCOUNTER — Encounter (HOSPITAL_BASED_OUTPATIENT_CLINIC_OR_DEPARTMENT_OTHER): Payer: Self-pay | Admitting: Orthopedic Surgery

## 2024-08-01 ENCOUNTER — Ambulatory Visit (HOSPITAL_BASED_OUTPATIENT_CLINIC_OR_DEPARTMENT_OTHER)
Admission: RE | Admit: 2024-08-01 | Discharge: 2024-08-01 | Disposition: A | Discharge status: Home / Self Care | Attending: Orthopedic Surgery | Admitting: Orthopedic Surgery

## 2024-08-01 DIAGNOSIS — Z951 Presence of aortocoronary bypass graft: Secondary | ICD-10-CM | POA: Diagnosis not present

## 2024-08-01 DIAGNOSIS — S52572A Other intraarticular fracture of lower end of left radius, initial encounter for closed fracture: Secondary | ICD-10-CM

## 2024-08-01 DIAGNOSIS — Z87891 Personal history of nicotine dependence: Secondary | ICD-10-CM | POA: Insufficient documentation

## 2024-08-01 DIAGNOSIS — I2510 Atherosclerotic heart disease of native coronary artery without angina pectoris: Secondary | ICD-10-CM | POA: Insufficient documentation

## 2024-08-01 DIAGNOSIS — I10 Essential (primary) hypertension: Secondary | ICD-10-CM | POA: Insufficient documentation

## 2024-08-01 DIAGNOSIS — S52572K Other intraarticular fracture of lower end of left radius, subsequent encounter for closed fracture with nonunion: Secondary | ICD-10-CM | POA: Insufficient documentation

## 2024-08-01 DIAGNOSIS — W19XXXA Unspecified fall, initial encounter: Secondary | ICD-10-CM | POA: Diagnosis not present

## 2024-08-01 DIAGNOSIS — I25119 Atherosclerotic heart disease of native coronary artery with unspecified angina pectoris: Secondary | ICD-10-CM | POA: Diagnosis not present

## 2024-08-01 DIAGNOSIS — E039 Hypothyroidism, unspecified: Secondary | ICD-10-CM | POA: Diagnosis not present

## 2024-08-01 DIAGNOSIS — Z01818 Encounter for other preprocedural examination: Secondary | ICD-10-CM

## 2024-08-01 DIAGNOSIS — K219 Gastro-esophageal reflux disease without esophagitis: Secondary | ICD-10-CM | POA: Insufficient documentation

## 2024-08-01 HISTORY — PX: OPEN REDUCTION INTERNAL FIXATION (ORIF) DISTAL RADIAL FRACTURE: SHX5989

## 2024-08-01 SURGERY — OPEN REDUCTION INTERNAL FIXATION (ORIF) DISTAL RADIUS FRACTURE
Anesthesia: Regional | Site: Arm Lower | Laterality: Left

## 2024-08-01 MED ORDER — VANCOMYCIN HCL IN DEXTROSE 1-5 GM/200ML-% IV SOLN
1000.0000 mg | INTRAVENOUS | Status: AC
  Administered 2024-08-01: 1000 mg via INTRAVENOUS

## 2024-08-01 MED ORDER — FENTANYL CITRATE (PF) 250 MCG/5ML IJ SOLN: INTRAMUSCULAR | Status: DC | PRN

## 2024-08-01 MED ORDER — OXYCODONE HCL 5 MG PO TABS: 5.0000 mg | ORAL_TABLET | Freq: Once | ORAL | Status: DC | PRN

## 2024-08-01 MED ORDER — FENTANYL CITRATE (PF) 100 MCG/2ML IJ SOLN
25.0000 ug | Freq: Once | INTRAMUSCULAR | Status: AC
  Administered 2024-08-01: 25 ug via INTRAVENOUS

## 2024-08-01 MED ORDER — FENTANYL CITRATE (PF) 100 MCG/2ML IJ SOLN: 25.0000 ug | INTRAMUSCULAR | Status: DC | PRN

## 2024-08-01 MED ORDER — VANCOMYCIN HCL IN DEXTROSE 1-5 GM/200ML-% IV SOLN
INTRAVENOUS | Status: AC
  Filled 2024-08-01: qty 200

## 2024-08-01 MED ORDER — OXYCODONE HCL 5 MG/5ML PO SOLN: 5.0000 mg | Freq: Once | ORAL | Status: DC | PRN

## 2024-08-01 MED ORDER — DROPERIDOL 2.5 MG/ML IJ SOLN: 0.6250 mg | Freq: Once | INTRAMUSCULAR | Status: DC | PRN

## 2024-08-01 MED ORDER — EPHEDRINE SULFATE (PRESSORS) 25 MG/5ML IV SOSY
PREFILLED_SYRINGE | INTRAVENOUS | Status: DC | PRN
  Administered 2024-08-01: 5 mg via INTRAVENOUS

## 2024-08-01 MED ORDER — DEXAMETHASONE SOD PHOSPHATE PF 10 MG/ML IJ SOLN
INTRAMUSCULAR | Status: DC | PRN
  Administered 2024-08-01: 4 mg via INTRAVENOUS

## 2024-08-01 MED ORDER — FENTANYL CITRATE (PF) 100 MCG/2ML IJ SOLN
INTRAMUSCULAR | Status: AC
  Filled 2024-08-01: qty 2

## 2024-08-01 MED ORDER — 0.9 % SODIUM CHLORIDE (POUR BTL) OPTIME
TOPICAL | Status: DC | PRN
  Administered 2024-08-01: 100 mL

## 2024-08-01 MED ORDER — GLYCOPYRROLATE PF 0.2 MG/ML IJ SOSY
PREFILLED_SYRINGE | INTRAMUSCULAR | Status: DC | PRN
  Administered 2024-08-01: .1 mg via INTRAVENOUS

## 2024-08-01 MED ORDER — MIDAZOLAM HCL (PF) 2 MG/2ML IJ SOLN
1.0000 mg | Freq: Once | INTRAMUSCULAR | Status: AC
  Administered 2024-08-01: 1 mg via INTRAVENOUS

## 2024-08-01 MED ORDER — DEXMEDETOMIDINE HCL IN NACL 80 MCG/20ML IV SOLN
INTRAVENOUS | Status: DC | PRN
  Administered 2024-08-01 (×2): 2 ug via INTRAVENOUS

## 2024-08-01 MED ORDER — FENTANYL CITRATE (PF) 250 MCG/5ML IJ SOLN
INTRAMUSCULAR | Status: DC | PRN
  Administered 2024-08-01: 25 ug via INTRAVENOUS

## 2024-08-01 MED ORDER — HYDROCODONE-ACETAMINOPHEN 5-325 MG PO TABS: 1.0000 | ORAL_TABLET | Freq: Four times a day (QID) | ORAL | 0 refills | Status: DC | PRN

## 2024-08-01 MED ORDER — BUPIVACAINE-EPINEPHRINE (PF) 0.5% -1:200000 IJ SOLN
INTRAMUSCULAR | Status: DC | PRN
  Administered 2024-08-01: 30 mL via PERINEURAL

## 2024-08-01 MED ORDER — ONDANSETRON HCL 4 MG/2ML IJ SOLN
INTRAMUSCULAR | Status: DC | PRN
  Administered 2024-08-01: 4 mg via INTRAVENOUS

## 2024-08-01 MED ORDER — GLYCOPYRROLATE PF 0.2 MG/ML IJ SOSY
PREFILLED_SYRINGE | INTRAMUSCULAR | Status: AC
  Filled 2024-08-01: qty 2

## 2024-08-01 MED ORDER — ACETAMINOPHEN 10 MG/ML IV SOLN: 1000.0000 mg | Freq: Once | INTRAVENOUS | Status: DC | PRN

## 2024-08-01 MED ORDER — EPHEDRINE 5 MG/ML INJ
INTRAVENOUS | Status: AC
Start: 2024-08-01 — End: 2024-08-01
  Filled 2024-08-01: qty 5

## 2024-08-01 MED ORDER — LEVOFLOXACIN IN D5W 500 MG/100ML IV SOLN
INTRAVENOUS | Status: AC
  Filled 2024-08-01: qty 100

## 2024-08-01 MED ORDER — LACTATED RINGERS IV SOLN: INTRAVENOUS | Status: DC

## 2024-08-01 MED ORDER — ONDANSETRON HCL 4 MG/2ML IJ SOLN
INTRAMUSCULAR | Status: AC
  Filled 2024-08-01: qty 10

## 2024-08-01 MED ORDER — LEVOFLOXACIN IN D5W 500 MG/100ML IV SOLN: 500.0000 mg | INTRAVENOUS | Status: DC

## 2024-08-01 MED ORDER — PROPOFOL 500 MG/50ML IV EMUL
INTRAVENOUS | Status: DC | PRN
Start: 2024-08-01 — End: 2024-08-01
  Administered 2024-08-01: 100 ug/kg/min via INTRAVENOUS

## 2024-08-01 MED ORDER — MIDAZOLAM HCL 2 MG/2ML IJ SOLN
INTRAMUSCULAR | Status: AC
Start: 2024-08-01 — End: 2024-08-01
  Filled 2024-08-01: qty 2

## 2024-08-01 SURGICAL SUPPLY — 43 items
BIT DRILL 2.0 LNG QUCK RELEASE (BIT) IMPLANT
BIT DRILL QC 2.8X5 (BIT) IMPLANT
BLADE SURG 15 STRL LF DISP TIS (BLADE) ×2 IMPLANT
BNDG COMPR ESMARK 4X3 LF (GAUZE/BANDAGES/DRESSINGS) ×1 IMPLANT
BNDG ELASTIC 3INX 5YD STR LF (GAUZE/BANDAGES/DRESSINGS) ×1 IMPLANT
BNDG GAUZE DERMACEA FLUFF 4 (GAUZE/BANDAGES/DRESSINGS) ×1 IMPLANT
BNDG PLASTER X FAST 3X3 WHT LF (CAST SUPPLIES) ×10 IMPLANT
CHLORAPREP W/TINT 26 (MISCELLANEOUS) ×1 IMPLANT
CORD BIPOLAR FORCEPS 12FT (ELECTRODE) ×1 IMPLANT
COVER BACK TABLE 60X90IN (DRAPES) ×1 IMPLANT
COVER MAYO STAND STRL (DRAPES) ×1 IMPLANT
CUFF TOURN SGL QUICK 18X4 (TOURNIQUET CUFF) IMPLANT
CUFF TRNQT CYL 24X4X16.5-23 (TOURNIQUET CUFF) IMPLANT
DRAPE EXTREMITY T 121X128X90 (DISPOSABLE) ×1 IMPLANT
DRAPE OEC MINIVIEW 54X84 (DRAPES) ×1 IMPLANT
DRAPE SURG 17X23 STRL (DRAPES) ×1 IMPLANT
GAUZE SPONGE 4X4 12PLY STRL (GAUZE/BANDAGES/DRESSINGS) ×1 IMPLANT
GAUZE XEROFORM 1X8 LF (GAUZE/BANDAGES/DRESSINGS) ×1 IMPLANT
GLOVE BIO SURGEON STRL SZ7.5 (GLOVE) ×1 IMPLANT
GLOVE BIOGEL PI IND STRL 8 (GLOVE) ×1 IMPLANT
GLOVE BIOGEL PI IND STRL 8.5 (GLOVE) IMPLANT
GLOVE SURG ORTHO 8.0 STRL STRW (GLOVE) IMPLANT
GOWN STRL REUS W/ TWL LRG LVL3 (GOWN DISPOSABLE) ×1 IMPLANT
GOWN STRL REUS W/TWL XL LVL3 (GOWN DISPOSABLE) ×1 IMPLANT
GUIDEWIRE ORTHO 0.054X6 (WIRE) IMPLANT
NDL HYPO 25X1 1.5 SAFETY (NEEDLE) IMPLANT
NEEDLE HYPO 25X1 1.5 SAFETY (NEEDLE) IMPLANT
PACK BASIN DAY SURGERY FS (CUSTOM PROCEDURE TRAY) ×1 IMPLANT
PAD CAST 3X4 CTTN HI CHSV (CAST SUPPLIES) ×1 IMPLANT
PLATE ACU LOC PROX STD LEFT (Plate) IMPLANT
SCREW CORT FT 18X2.3XLCK HEX (Screw) IMPLANT
SCREW FX20X2.3XSMTH LCK NS CRT (Screw) IMPLANT
SCREW HEXALOBE NON-LOCK 3.5X14 (Screw) IMPLANT
SCREW NONLOCK HEX 3.5X12 (Screw) IMPLANT
SLEEVE SCD COMPRESS KNEE MED (STOCKING) IMPLANT
SOLN 0.9% NACL POUR BTL 1000ML (IV SOLUTION) ×1 IMPLANT
STOCKINETTE 4X48 STRL (DRAPES) ×1 IMPLANT
SUT ETHILON 4 0 PS 2 18 (SUTURE) ×1 IMPLANT
SUT VIC AB 4-0 PS2 18 (SUTURE) ×1 IMPLANT
SYR BULB EAR ULCER 3OZ GRN STR (SYRINGE) ×1 IMPLANT
SYR CONTROL 10ML LL (SYRINGE) IMPLANT
TOWEL GREEN STERILE FF (TOWEL DISPOSABLE) ×2 IMPLANT
UNDERPAD 30X36 HEAVY ABSORB (UNDERPADS AND DIAPERS) ×1 IMPLANT

## 2024-08-01 NOTE — Discharge Instructions (Addendum)
 Hand Center Instructions Hand Surgery  Wound Care: Keep your hand elevated above the level of your heart.  Do not allow it to dangle by your side.  Keep the dressing dry and do not remove it unless your doctor advises you to do so.  He will usually change it at the time of your post-op visit.  Moving your fingers is advised to stimulate circulation but will depend on the site of your surgery.  If you have a splint applied, your doctor will advise you regarding movement.  Activity: Do not drive or operate machinery today.  Rest today and then you may return to your normal activity and work as indicated by your physician.  Diet:  Drink liquids today or eat a light diet.  You may resume a regular diet tomorrow.    General expectations: Pain for two to three days. Fingers may become slightly swollen.  Call your doctor if any of the following occur: Severe pain not relieved by pain medication. Elevated temperature. Dressing soaked with blood. Inability to move fingers. White or bluish color to fingers.    Post Anesthesia Home Care Instructions  Activity: Get plenty of rest for the remainder of the day. A responsible individual must stay with you for 24 hours following the procedure.  For the next 24 hours, DO NOT: -Drive a car -Advertising copywriter -Drink alcoholic beverages -Take any medication unless instructed by your physician -Make any legal decisions or sign important papers.  Meals: Start with liquid foods such as gelatin or soup. Progress to regular foods as tolerated. Avoid greasy, spicy, heavy foods. If nausea and/or vomiting occur, drink only clear liquids until the nausea and/or vomiting subsides. Call your physician if vomiting continues.  Special Instructions/Symptoms: Your throat may feel dry or sore from the anesthesia or the breathing tube placed in your throat during surgery. If this causes discomfort, gargle with warm salt water. The discomfort should disappear  within 24 hours.  If you had a scopolamine patch placed behind your ear for the management of post- operative nausea and/or vomiting:  1. The medication in the patch is effective for 72 hours, after which it should be removed.  Wrap patch in a tissue and discard in the trash. Wash hands thoroughly with soap and water. 2. You may remove the patch earlier than 72 hours if you experience unpleasant side effects which may include dry mouth, dizziness or visual disturbances. 3. Avoid touching the patch. Wash your hands with soap and water after contact with the patch.     Regional Anesthesia Blocks  1. You may not be able to move or feel the "blocked" extremity after a regional anesthetic block. This may last may last from 3-48 hours after placement, but it will go away. The length of time depends on the medication injected and your individual response to the medication. As the nerves start to wake up, you may experience tingling as the movement and feeling returns to your extremity. If the numbness and inability to move your extremity has not gone away after 48 hours, please call your surgeon.   2. The extremity that is blocked will need to be protected until the numbness is gone and the strength has returned. Because you cannot feel it, you will need to take extra care to avoid injury. Because it may be weak, you may have difficulty moving it or using it. You may not know what position it is in without looking at it while the block is in  effect.  3. For blocks in the legs and feet, returning to weight bearing and walking needs to be done carefully. You will need to wait until the numbness is entirely gone and the strength has returned. You should be able to move your leg and foot normally before you try and bear weight or walk. You will need someone to be with you when you first try to ensure you do not fall and possibly risk injury.  4. Bruising and tenderness at the needle site are common side  effects and will resolve in a few days.  5. Persistent numbness or new problems with movement should be communicated to the surgeon or the [facility] [phone] [facility] [phone]).

## 2024-08-01 NOTE — Op Note (Signed)
 I assisted Surgeons and Role:    * Murrell Drivers, MD - Primary    DEWAINE Murrell Kuba, MD - Assisting on the Procedure(s): OPEN REDUCTION INTERNAL FIXATION (ORIF) DISTAL RADIUS FRACTURE on 08/01/2024.  I provided assistance on this case as follows: Set up, approach, identification and protection radial artery, release of the brachial radialis, identification of the fracture, debridement of the fracture, duction and stabilization of the fracture, placement of the plate, fixation of the plate with pins and screws, closure of the wound and application of the dressing and splint.  Electronically signed by: Kuba Murrell, MD Date: 08/01/2024 Time: 12:46 PM

## 2024-08-01 NOTE — Anesthesia Procedure Notes (Signed)
 Anesthesia Regional Block: Supraclavicular block   Pre-Anesthetic Checklist: , timeout performed,  Correct Patient, Correct Site, Correct Laterality,  Correct Procedure, Correct Position, site marked,  Risks and benefits discussed,  Surgical consent,  Pre-op evaluation,  At surgeon's request and post-op pain management  Laterality: Left  Prep: chloraprep       Needles:  Injection technique: Single-shot  Needle Type: Echogenic Stimulator Needle     Needle Length: 9cm  Needle Gauge: 21     Additional Needles:   Procedures:,,,, ultrasound used (permanent image in chart),,    Narrative:  Start time: 08/01/2024 10:15 AM End time: 08/01/2024 10:20 AM Injection made incrementally with aspirations every 5 mL.  Performed by: Personally  Anesthesiologist: Tilford Franky BIRCH, MD  Additional Notes: Discussed risks and benefits of the nerve block in detail, including but not limited vascular injury, permanent nerve damage and infection.   Patient tolerated the procedure well. Local anesthetic introduced in an incremental fashion under minimal resistance after negative aspirations. No paresthesias were elicited. After completion of the procedure, no acute issues were identified and patient continued to be monitored by RN.

## 2024-08-01 NOTE — H&P (Signed)
 Marissa Lee is an 73 y.o. female.   Chief Complaint: distal radius fracture HPI: 73 yo female states she fell 07/20/24 injuring left wrist.  Seen at [facility] where XR revealed distal radius fracture.  Splinted and followed up in office.  She wishes to proceed with surgical fixation.  Allergies:  Allergies  Allergen Reactions   Cephalosporins Anaphylaxis and Swelling    Throat swells   Clindamycin /Lincomycin     Metallic taste, blisters behind ears   Codeine Itching   Lisinopril  Cough   Diflucan  [Fluconazole ] Rash   Promethazine  Rash    Past Medical History:  Diagnosis Date   Anxiety    CAD (coronary artery disease), autologous vein bypass graft    Endometriosis    Fibrocystic breast    muliple drainage   Fibroid    GERD (gastroesophageal reflux disease)    Hiatal hernia    History of bronchitis    Hypercholesteremia 2007   Melanoma of toe, 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/2022   Osteopenia 08/2016   hip and spine   Postmenopausal state 06/05/2013   Subclinical hypothyroidism 10/04/2018   Urge incontinence 06/05/2013    Past Surgical History:  Procedure Laterality Date   ABDOMINAL HYSTERECTOMY     BLADDER SUSPENSION  2004   done with [facility]   BREAST BIOPSY  1980   done 3x   CLOSED REDUCTION METACARPAL WITH PERCUTANEOUS PINNING Right 08/24/2016   Procedure: CLOSED REDUCTION  WITH PERCUTANEOUS PINNING;  Surgeon: Prentice Pagan, MD;  Location: [facility] OR;  Service: Orthopedics;  Laterality: Right;   COLONOSCOPY     COMBINED HYSTERECTOMY VAGINAL / OOPHORECTOMY / A&P REPAIR  2004   CORONARY ARTERY BYPASS GRAFT N/A 07/28/2021   Procedure: CORONARY ARTERY BYPASS GRAFTING (CABG) times two using left internal mammary artery and  left leg saphenous vein;  Surgeon: Kerrin Elspeth BROCKS, MD;  Location: [facility] OR;  Service: Open Heart Surgery;  Laterality: N/A;   FOOT SURGERY Right 1992   IABP INSERTION Right 07/28/2021   Procedure: IABP Insertion;  Surgeon: Anner Alm ORN, MD;  Location: [facility] INVASIVE CV LAB;   Service: Cardiovascular;  Laterality: Right;   LEFT HEART CATH AND CORONARY ANGIOGRAPHY N/A 07/28/2021   Procedure: LEFT HEART CATH AND CORONARY ANGIOGRAPHY;  Surgeon: Anner Alm ORN, MD;  Location: [facility] INVASIVE CV LAB;  Service: Cardiovascular;  Laterality: N/A;   LEFT HEART CATH AND CORS/GRAFTS ANGIOGRAPHY N/A 03/27/2024   Procedure: LEFT HEART CATH AND CORS/GRAFTS ANGIOGRAPHY;  Surgeon: Ladona Heinz, MD;  Location: [facility] INVASIVE CV LAB;  Service: Cardiovascular;  Laterality: N/A;   OPEN REDUCTION INTERNAL FIXATION (ORIF) HAND Right 08/24/2016   Procedure: RIGHT HAND LONG RING AND SMALL FINGER  POSSIBLE ORIF;  Surgeon: Prentice Pagan, MD;  Location: [facility] OR;  Service: Orthopedics;  Laterality: Right;   WISDOM TOOTH EXTRACTION  1978    Family History: Family History  Problem Relation Age of Onset   Heart disease Mother        valvular disease, valve replacement   Atrial fibrillation Mother    Stroke Mother    Heart disease Brother        valve disease, valve surgery   Thyroid  disease Maternal Grandmother        Goiter   Colon cancer Paternal Grandmother    Cancer Paternal Grandmother        Colon    Cancer Paternal Grandfather        Lung   Thyroid  disease Daughter        Hypothyroid  Esophageal cancer Neg Hx    Liver disease Neg Hx    Rectal cancer Neg Hx    Stomach cancer Neg Hx     Social History:   reports that she quit smoking about 25 years ago. Her smoking use included cigarettes. She started smoking about 45 years ago. She has a 20 pack-year smoking history. She has never used smokeless tobacco. She reports current alcohol use of about 3.0 - 4.0 standard drinks of alcohol per week. She reports that she does not use drugs.  Medications: Medications Prior to Admission  Medication Sig Dispense Refill   amLODipine  (NORVASC ) 5 MG tablet TAKE 1 TABLET(5 MG) BY MOUTH DAILY 90 tablet 2   Apoaequorin (PREVAGEN PO) Take 1 tablet by mouth daily at 2 am.     aspirin  EC 81 MG tablet Take  81 mg by mouth daily. Swallow whole.     atorvastatin  (LIPITOR ) 40 MG tablet Take 1 tablet (40 mg total) by mouth daily. 90 tablet 3   Calcium -Phosphorus-Vitamin D  (CITRACAL +D3 PO) Take 1 tablet by mouth daily. Gummy     clopidogrel  (PLAVIX ) 75 MG tablet TAKE 1 TABLET(75 MG) BY MOUTH DAILY 90 tablet 3   Dextrose , Diabetic Use, (GLUCOSE PO) Take 1 tablet by mouth daily as needed (hypoglycemic episodes).     FLUoxetine  (PROZAC ) 40 MG capsule Take 1 capsule (40 mg total) by mouth daily. 30 capsule 0   isosorbide  mononitrate (IMDUR ) 60 MG 24 hr tablet Take 1 tablet (60 mg total) by mouth daily. 90 tablet 3   metoprolol  succinate (TOPROL -XL) 25 MG 24 hr tablet TAKE 1/2 TABLET(12.5 MG) BY MOUTH DAILY 45 tablet 2   oxyCODONE  (ROXICODONE ) 5 MG immediate release tablet Take 1 tablet (5 mg total) by mouth every 4 (four) hours as needed for up to 10 doses for severe pain (pain score 7-10). 10 tablet 0   pantoprazole  (PROTONIX ) 40 MG tablet TAKE 1 TABLET(40 MG) BY MOUTH EVERY DAY 90 tablet 3   azelastine  (ASTELIN ) 0.1 % nasal spray Place 2 sprays into both nostrils 2 (two) times daily. Use in each nostril as directed (Patient taking differently: Place 2 sprays into both nostrils 2 (two) times daily as needed. Use in each nostril as directed) 30 mL 12   nitroGLYCERIN  (NITROSTAT ) 0.4 MG SL tablet PLACE 1 TABLET UNDER THE TONGUE EVERY 5 MINUTES AS NEEDED FOR CHEST PAIN 25 tablet 3    No results found for this or any previous visit (from the past 48 hours).  No results found.    Blood pressure 130/82, pulse (!) 53, resp. rate 14, height 5' 4 (1.626 m), weight 65.1 kg, last menstrual period 09/27/2003, SpO2 97%.  General appearance: alert, cooperative, and appears stated age Head: Normocephalic, without obvious abnormality, atraumatic Neck: supple, symmetrical, trachea midline Extremities: Intact sensation and capillary refill all digits.  +epl/fpl/io.  No wounds.  Skin: Skin color, texture, turgor  normal. No rashes or lesions Neurologic: Grossly normal Incision/Wound: none  Assessment/Plan Left distal radius fracture.  Non operative and operative treatment options have been discussed with the patient and patient wishes to proceed with operative treatment. Risks, benefits and alternatives of surgery were discussed including risks of blood loss, infection, damage to nerves/vessels/tendons/ligament/bone, failure of surgery, need for additional surgery, complication with wound healing, stiffness, nonunion, malunion.  She voiced understanding of these risks and elected to proceed.    Marissa Lee 08/01/2024, 10:10 AM

## 2024-08-01 NOTE — Anesthesia Postprocedure Evaluation (Signed)
 Anesthesia Post Note  Patient: Marissa Lee  Procedure(s) Performed: OPEN REDUCTION INTERNAL FIXATION (ORIF) DISTAL RADIUS FRACTURE (Left: Arm Lower)     Patient location during evaluation: PACU Anesthesia Type: Regional Level of consciousness: awake and alert Pain management: pain level controlled Vital Signs Assessment: post-procedure vital signs reviewed and stable Respiratory status: spontaneous breathing, nonlabored ventilation, respiratory function stable and patient connected to nasal cannula oxygen Cardiovascular status: stable and blood pressure returned to baseline Postop Assessment: no apparent nausea or vomiting Anesthetic complications: no   No notable events documented.  Last Vitals:  Vitals:   08/01/24 1315 08/01/24 1328  BP: 114/64 (!) 124/59  Pulse: 60 62  Resp: 14 16  Temp:  (!) 36.3 C  SpO2: 92% 94%    Last Pain:  Vitals:   08/01/24 1328  PainSc: 0-No pain                 [name]

## 2024-08-01 NOTE — Transfer of Care (Signed)
 Immediate Anesthesia Transfer of Care Note  Patient: Marissa Lee  Procedure(s) Performed: OPEN REDUCTION INTERNAL FIXATION (ORIF) DISTAL RADIUS FRACTURE (Left: Arm Lower)  Patient Location: PACU  Anesthesia Type:MAC combined with regional for post-op pain  Level of Consciousness: drowsy  Airway & Oxygen Therapy: Patient Spontanous Breathing and Patient connected to face mask oxygen  Post-op Assessment: Report given to RN and Post -op Vital signs reviewed and stable  Post vital signs: Reviewed and stable  Last Vitals:  Vitals Value Taken Time  BP 119/51 (71)   Temp    Pulse 66 08/01/24 12:50  Resp 16 08/01/24 12:50  SpO2 99 % 08/01/24 12:50  Vitals shown include unfiled device data.  Last Pain:  Vitals:   08/01/24 0924  PainSc: 7          Complications: No notable events documented.

## 2024-08-01 NOTE — Progress Notes (Signed)
 Assisted Dr. Tilford with left, supraclavicular, ultrasound guided block. Side rails up, monitors on throughout procedure. See vital signs in flow sheet. Tolerated Procedure well.

## 2024-08-01 NOTE — Op Note (Signed)
 08/01/2024  SURGERY CENTER  Operative Note  Pre Op Diagnosis: Left comminuted intraarticular distal radius fracture  Post Op Diagnosis: Left comminuted intraarticular distal radius fracture  Procedure: ORIF Left comminuted intraarticular distal radius fracture, 2 intra-articular fragments  Surgeon: Franky Curia, MD  Assistant: Arley Curia, MD  Anesthesia: Regional with sedation  Fluids: Per anesthesia flow sheet  EBL: minimal  Complications: None  Specimen: None  Tourniquet Time:  Total Tourniquet Time Documented: Upper Arm (Left) - 50 minutes Total: Upper Arm (Left) - 50 minutes   Disposition: Stable to PACU  INDICATIONS:  Marissa Lee is a 73 y.o. female patient fell roughly 10 days ago injuring her left wrist presented emergency department where radiographs obtained distal radius fracture.  Splinted and follow-up in the office.  Nonoperative and operative treatment options were discussed.  She wished to proceed with operative fixation.  Risks, benefits, and alternatives of surgery were discussed including the risk of blood loss; infection; damage to nerves, vessels, tendons, ligaments, bone; failure of surgery; need for additional surgery; complications with wound healing; continued pain; nonunion; malunion; stiffness.  We also discussed the possible need for bone graft and the benefits and risks including the possibility of disease transmission.  She voiced understanding of these risks and elected to proceed.    OPERATIVE COURSE:  After being identified preoperatively by myself, the patient and I agreed upon the procedure and site of procedure.  Surgical site was marked.   Surgical consent had been signed.  She was given IV vancomycin  as preoperative antibiotic prophylaxis.  She was transferred to the operating room and placed on the operating room table in supine position with the Left upper extremity on an armboard. Sedation was induced by the anesthesiologist.  A  regional block had been performed by anesthesia in preoperative holding.  The Left upper extremity was prepped and draped in normal sterile orthopedic fashion.  A surgical pause was performed between the surgeons, anesthesia and operating room staff, and all were in agreement as to the patient, procedure and site of procedure.  Tourniquet at the proximal aspect of the extremity was inflated to 250 mmHg after exsanguination of the limb with an Esmarch bandage.  Standard volar Victory approach was used.  The bipolar electrocautery was used to obtain hemostasis.  The superficial and deep portions of the FCR tendon sheath were incised, and the FCR and FPL were swept ulnarly to protect the palmar cutaneous branch of the median nerve.  The brachioradialis was released at the radial side of the radius.  The pronator quadratus was released and elevated with the periosteal elevator.  The fracture site was identified and cleared of soft tissue interposition and hematoma.  It was reduced under direct visualization.  There was intra-articular extension.  An AcuMed volar distal radial locking plate was selected.  It was secured to the bone with the guidepins.  C-arm was used in AP and lateral projections to ensure appropriate reduction and position of the hardware and adjustments made as necessary.  Standard AO drilling and measuring technique was used.  A single screw was placed in the slotted hole in the shaft of the plate.  The distal holes were filled with locking pegs with the exception of the styloid holes, which were filled with locking screws.  The remaining holes in the shaft of the plate were filled with nonlocking screws.  Good purchase was obtained.  C-arm was used in AP, lateral and oblique projections to ensure appropriate reduction and position of  hardware, which was the case.  There was no intra-articular penetration of hardware.  The wound was copiously irrigated with sterile saline.  Pronator quadratus was  repaired back over top of the plate using 4-0 Vicryl suture.  Vicryl suture was placed in the subcutaneous tissues in an inverted interrupted fashion and the skin was closed with 4-0 nylon in a horizontal mattress fashion.  There was good pronation and supination of the wrist without crepitance.  The wound was then dressed with sterile Xeroform, 4x4s, and wrapped with a Kerlix bandage.  A volar splint was placed and wrapped with Kerlix and Ace bandage.  Tourniquet was deflated at 50 minutes.  Fingertips were pink with brisk capillary refill after deflation of the tourniquet.  Operative drapes were broken down.  The patient was awoken from anesthesia safely.  She was transferred back to the stretcher and taken to the PACU in stable condition.  I will see her back in the office in one week for postoperative followup.  I will give her a prescription for Norco 5/325 1 tab PO q6 hours prn pain, dispense #20.    Bowen Goyal, MD Electronically signed, 08/01/24

## 2024-08-01 NOTE — Anesthesia Preprocedure Evaluation (Addendum)
 Anesthesia Evaluation  Patient identified by MRN, date of birth, ID band Patient awake    Reviewed: Allergy & Precautions, NPO status , Patient's Chart, lab work & pertinent test results  Airway Mallampati: II  TM Distance: >3 FB Neck ROM: Full    Dental  (+) Teeth Intact, Dental Advisory Given   Pulmonary former smoker   breath sounds clear to auscultation       Cardiovascular hypertension, Pt. on medications and Pt. on home beta blockers + angina  + CAD and + CABG   Rhythm:Regular Rate:Normal  LHC:  Although patient has very classic anginal symptoms, the LIMA to LAD appears to have a 40 to 60% stenosis at the insertion site however there is extensive and brisk antegrade flow even through a high-grade left main stenosis in fact filling the SVG and LIMA graft retrogradely all the way back.  Hence patient is well protected both with antegrade flow through native vessel and also via grafts as well.  Hence would recommend continued medical therapy.  Will add amlodipine  5 mg daily.  Perfusion scan: Myocardial perfusion is normal. Overall LV systolic function is normal without regional wall motion abnormalities. Stress LV EF: 72%.  Nondiagnostic ECG stress. The heart rate response was consistent with Regadenoson .  No previous exam available for comparison. Low risk.      Neuro/Psych   Anxiety      Neuromuscular disease    GI/Hepatic Neg liver ROS, hiatal hernia,GERD  Medicated,,  Endo/Other  Hypothyroidism    Renal/GU negative Renal ROS     Musculoskeletal  (+) Arthritis ,    Abdominal   Peds  Hematology negative hematology ROS (+)   Anesthesia Other Findings   Reproductive/Obstetrics                              Anesthesia Physical Anesthesia Plan  ASA: 3  Anesthesia Plan: Regional   Post-op Pain Management: Regional block*   Induction: Intravenous  PONV Risk Score and Plan: 3 and  Ondansetron , Propofol  infusion and Midazolam   Airway Management Planned: Natural Airway and Nasal Cannula  Additional Equipment: None  Intra-op Plan:   Post-operative Plan: Extubation in OR  Informed Consent: I have reviewed the patients History and Physical, chart, labs and discussed the procedure including the risks, benefits and alternatives for the proposed anesthesia with the patient or authorized representative who has indicated his/her understanding and acceptance.     Dental advisory given  Plan Discussed with: CRNA  Anesthesia Plan Comments:          Anesthesia Quick Evaluation

## 2024-08-02 ENCOUNTER — Encounter (HOSPITAL_BASED_OUTPATIENT_CLINIC_OR_DEPARTMENT_OTHER): Payer: Self-pay | Admitting: Orthopedic Surgery
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
## 2024-08-14 ENCOUNTER — Ambulatory Visit (HOSPITAL_COMMUNITY)

## 2024-08-26 ENCOUNTER — Ambulatory Visit (HOSPITAL_COMMUNITY)

## 2024-08-27 NOTE — Progress Notes (Unsigned)
 Psychiatric Follow Up  Patient Identification: Marissa Lee MRN:  [id number] Date of Evaluation:  09/09/2024 Referral Source: PCP Chief Complaint:   Chief Complaint  Patient presents with   Follow-up   Medication Refill   Depression   Anxiety   Visit Diagnosis:    ICD-10-CM   1. Anxiety  F41.9 FLUoxetine  (PROZAC ) 40 MG capsule    2. Major depressive disorder in partial remission, unspecified whether recurrent  F32.4 FLUoxetine  (PROZAC ) 40 MG capsule       Assessment:  Marissa Lee is a 73 y.o. female with a history of MDD on Prozac  who presents in person to [facility] at [facility] for follow up on 09/09/2024.   Patient has remained the same since her initial presentation though she has been able to put some boundaries, placing herself first when it comes to managing her grandkids versus her mental and physical health.  She does have psychosocial stressors that impact her energy, mood.  She is not actively passively homicidal or suicidal, has been eating well but the sleep has been fragmented due to recent fall.  She has not been using any substances including alcohol or cigarettes.  Her social life has been impacted due to the responsibilities of taking care of her grandkids that make her feel tired, anxious eventually affecting her mood.  She would benefit from interpersonal counseling/therapy and has an appointment scheduled at the end of January.  We did some motivational counseling today, encouraged her to follow a sleep schedule and continuing to put herself, her physical and mental health first.  Most of her symptoms of depression and anxiety are related to the stressors.  Plan to continue her on the same medication management and have her back in the clinic in 6 to 8 weeks.  Risk Assessment: An assessment of suicide and violence risk factors was performed as part of this evaluation and is not significantly changed from the last visit. While future  psychiatric events cannot be accurately predicted, the patient does not currently require acute inpatient psychiatric care and does not currently meet Marissa Lee  involuntary commitment criteria. Patient was given contact information for crisis resources, behavioral health clinic and was instructed to call 911 for emergencies.    Plan: # MDD in partial remission Past medication trials: Prozac  Status of problem: Current Interventions: -- Continue Prozac  40 mg daily -- Start psychotherapy as outpatient, patient has appointment scheduled in Jan  # GAD Past medication trials: Prozac  Status of problem: Current Interventions: -- Continue Prozac  40 mg daily -- Start psychotherapy as outpatient, patient has appointment scheduled in Jan   History of Present Illness:    Marissa Lee is a 73 year old female with history of MDD that presented to the clinic today for follow up.  She reported her mood as housebound .  Reported that she fell on October 25 and broke her left wrist, had 14 stitches and is currently in physical therapy for that.  She reported that she is flying to [place] on Friday to celebrate Christmas with her son, daughter-in-law and other family members.  When asked about depression she stated that I am depressed still , when asked about the reason she stated my daughter has 4 kids, she has been dealing with it, the little 1 is with me, stresses me out .  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  She reported that I get tired with him .  Reported that I have been trying to play bridges, there  is a group of women that have not given up on me I was able to go to [place] for brunch for my birthday .  Nurse about anxiety she stated that due to all all going around .  Reported that she has been having restless sleep due to the fall and associated pain and ongoing stressors.  When asked about appetite she stated same , it has been a little limited due to not being able  to go grocery shopping .  Reported that my social life is limited .  She denied any active or passive SI/HI/AVH.  She denied any symptoms of mania.  She denied using any substance including alcohol or cigarettes.  Reported that she is sometimes get overwhelmed and get panic attacks , when asked about how she deals with that she stated I write it down, think about it . Reported that Prozac  has been helpful but I cannot tell much of a difference , discussed about psychotherapy, patient has an appointment scheduled at the end of January.  Recommended her continuing the same medications, discussed the risk benefits and side effects.  Discussed boundaries, limitations and motivational counseling was used today.  Will have her back in the clinic in 6 to 8 weeks.  Associated Signs/Symptoms: Depression Symptoms:  depressed mood, fatigue, feelings of worthlessness/guilt, difficulty concentrating, hopelessness, anxiety, (Hypo) Manic Symptoms:  None Anxiety Symptoms:  Excessive Worry, Psychotic Symptoms:  None PTSD Symptoms: Negative  Past Psychiatric History:  Past psychiatric diagnoses: MDD Psychiatric hospitalizations:Denies Past suicide attempts: Denies Hx of self harm: Denies Hx of violence towards others: Denies Prior psychiatric providers: Denies Prior therapy: Denies Access to firearms: Denies  Prior medication trials: Prozac  10 mg  Substance use: Denies  Past Medical History:  Past Medical History:  Diagnosis Date   Anxiety    CAD (coronary artery disease), autologous vein bypass graft    Endometriosis    Fibrocystic breast    muliple drainage   Fibroid    GERD (gastroesophageal reflux disease)    Hiatal hernia    History of bronchitis    Hypercholesteremia 2007   Melanoma of toe, 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/2022   Osteopenia 08/2016   hip and spine   Postmenopausal state 06/05/2013   Subclinical hypothyroidism 10/04/2018   Urge incontinence 06/05/2013    Past Surgical  History:  Procedure Laterality Date   ABDOMINAL HYSTERECTOMY     BLADDER SUSPENSION  2004   done with [facility]   BREAST BIOPSY  1980   done 3x   CLOSED REDUCTION METACARPAL WITH PERCUTANEOUS PINNING Right 08/24/2016   Procedure: CLOSED REDUCTION  WITH PERCUTANEOUS PINNING;  Surgeon: Prentice Pagan, MD;  Location: [facility] OR;  Service: Orthopedics;  Laterality: Right;   COLONOSCOPY     COMBINED HYSTERECTOMY VAGINAL / OOPHORECTOMY / A&P REPAIR  2004   CORONARY ARTERY BYPASS GRAFT N/A 07/28/2021   Procedure: CORONARY ARTERY BYPASS GRAFTING (CABG) times two using left internal mammary artery and  left leg saphenous vein;  Surgeon: Kerrin Elspeth BROCKS, MD;  Location[facility] OR;  Service: Open Heart Surgery;  Laterality: N/A;   FOOT SURGERY Right 1992   IABP INSERTION Right 07/28/2021   Procedure: IABP Insertion;  Surgeon: Anner Alm ORN, MD;  Location: [facility] INVASIVE CV LAB;  Service: Cardiovascular;  Laterality: Right;   LEFT HEART CATH AND CORONARY ANGIOGRAPHY N/A 07/28/2021   Procedure: LEFT HEART CATH AND CORONARY ANGIOGRAPHY;  Surgeon: Anner Alm ORN, MD;  Location: [facility] INVASIVE CV LAB;  Service: Cardiovascular;  Laterality: N/A;  LEFT HEART CATH AND CORS/GRAFTS ANGIOGRAPHY N/A 03/27/2024   Procedure: LEFT HEART CATH AND CORS/GRAFTS ANGIOGRAPHY;  Surgeon: Ladona Heinz, MD;  Location: [facility] INVASIVE CV LAB;  Service: Cardiovascular;  Laterality: N/A;   OPEN REDUCTION INTERNAL FIXATION (ORIF) DISTAL RADIAL FRACTURE Left 08/01/2024   Procedure: OPEN REDUCTION INTERNAL FIXATION (ORIF) DISTAL RADIUS FRACTURE;  Surgeon: Murrell Drivers, MD;  Location: [facility];  Service: Orthopedics;  Laterality: Left;   OPEN REDUCTION INTERNAL FIXATION (ORIF) HAND Right 08/24/2016   Procedure: RIGHT HAND LONG RING AND SMALL FINGER  POSSIBLE ORIF;  Surgeon: Prentice Pagan, MD;  Location: [facility] OR;  Service: Orthopedics;  Laterality: Right;   WISDOM TOOTH EXTRACTION  1978    Family Psychiatric History: Nothing  significant  Family History:  Family History  Problem Relation Age of Onset   Heart disease Mother        valvular disease, valve replacement   Atrial fibrillation Mother    Stroke Mother    Heart disease Brother        valve disease, valve surgery   Thyroid  disease Maternal Grandmother        Goiter   Colon cancer Paternal Grandmother    Cancer Paternal Grandmother        Colon    Cancer Paternal Grandfather        Lung   Thyroid  disease Daughter        Hypothyroid   Esophageal cancer Neg Hx    Liver disease Neg Hx    Rectal cancer Neg Hx    Stomach cancer Neg Hx     Social History:   Social History   Socioeconomic History   Marital status: Single    Spouse name: Not on file   Number of children: 2   Years of education: Not on file   Highest education level: Not on file  Occupational History   Occupation: retire  Tobacco Use   Smoking status: Former    Current packs/day: 0.00    Average packs/day: 1 pack/day for 20.0 years (20.0 ttl pk-yrs)    Types: Cigarettes    Start date: 57    Quit date: 2000    Years since quitting: 25.9   Smokeless tobacco: Never   Tobacco comments:    quit in approximately 2000  Vaping Use   Vaping status: Never Used  Substance and Sexual Activity   Alcohol use: Yes    Alcohol/week: 3.0 - 4.0 standard drinks of alcohol    Types: 2 Cans of beer, 1 - 2 Standard drinks or equivalent per week    Comment: rarely   Drug use: No   Sexual activity: Not Currently    Partners: Male    Birth control/protection: Surgical    Comment: Hyst  Other Topics Concern   Not on file  Social History Narrative   Not on file   Social Drivers of Health   Tobacco Use: Low Risk (08/16/2024)   Received from [facility]   Patient History    Smoking Tobacco Use: Never    Smokeless Tobacco Use: Never    Passive Exposure: Not on file  Recent Concern: Tobacco Use - Medium Risk (08/01/2024)   Patient History    Smoking Tobacco Use: Former     Smokeless Tobacco Use: Never    Passive Exposure: Not on Actuary Strain: Not on file  Food Insecurity: Low Risk (08/09/2024)   Received from [facility]   Epic    Within the past  12 months, you worried that your food would run out before you got money to buy more: Never true    Within the past 12 months, the food you bought just didn't last and you didn't have money to get more. : Never true  Transportation Needs: No Transportation Needs (08/09/2024)   Received from [facility]    In the past 12 months, has lack of reliable transportation kept you from medical appointments, meetings, work or from getting things needed for daily living? : No  Physical Activity: Not on file  Stress: Not on file  Social Connections: Not on file  Depression (PHQ2-9): High Risk (07/17/2024)   Depression (PHQ2-9)    PHQ-2 Score: 20  Alcohol Screen: Not on file  Housing: Low Risk (08/09/2024)   Received from [facility]   Epic    What is your living situation today?: I have a steady place to live    Think about the place you live. Do you have problems with any of the following? Choose all that apply:: None/None on this list  Utilities: Low Risk (08/09/2024)   Received from [facility]   Utilities    In the past 12 months has the electric, gas, oil, or water company threatened to shut off services in your home? : No  Health Literacy: Not on file    Additional Social History: Patient was a editor, commissioning, retired 13 years ago.  Allergies:   Allergies  Allergen Reactions   Cephalosporins Anaphylaxis and Swelling    Throat swells   Clindamycin /Lincomycin     Metallic taste, blisters behind ears   Codeine Itching   Lisinopril  Cough   Diflucan  [Fluconazole ] Rash   Promethazine  Rash    Metabolic Disorder Labs: Lab Results  Component Value Date   HGBA1C 5.5 06/20/2023   MPG 102.54 07/28/2021   No results found for: PROLACTIN Lab Results  Component Value  Date   CHOL 156 03/18/2024   TRIG 130 03/18/2024   HDL 56 03/18/2024   CHOLHDL 2.8 03/18/2024   VLDL 82.9 05/05/2022   LDLCALC 77 03/18/2024   LDLCALC 88 05/05/2022   Lab Results  Component Value Date   TSH 4.75 05/03/2023    Therapeutic Level Labs: No results found for: LITHIUM No results found for: CBMZ No results found for: VALPROATE  Current Medications: Current Outpatient Medications  Medication Sig Dispense Refill   amLODipine  (NORVASC ) 5 MG tablet TAKE 1 TABLET(5 MG) BY MOUTH DAILY 90 tablet 2   Apoaequorin (PREVAGEN PO) Take 1 tablet by mouth daily at 2 am.     aspirin  EC 81 MG tablet Take 81 mg by mouth daily. Swallow whole.     atorvastatin  (LIPITOR ) 40 MG tablet Take 1 tablet (40 mg total) by mouth daily. 90 tablet 3   azelastine  (ASTELIN ) 0.1 % nasal spray Place 2 sprays into both nostrils 2 (two) times daily. Use in each nostril as directed (Patient taking differently: Place 2 sprays into both nostrils 2 (two) times daily as needed. Use in each nostril as directed) 30 mL 12   Calcium -Phosphorus-Vitamin D  (CITRACAL +D3 PO) Take 1 tablet by mouth daily. Gummy     clopidogrel  (PLAVIX ) 75 MG tablet TAKE 1 TABLET(75 MG) BY MOUTH DAILY 90 tablet 3   Dextrose , Diabetic Use, (GLUCOSE PO) Take 1 tablet by mouth daily as needed (hypoglycemic episodes).     FLUoxetine  (PROZAC ) 40 MG capsule Take 1 capsule (40 mg total) by mouth daily. 90 capsule 0  HYDROcodone -acetaminophen  (NORCO/VICODIN) 5-325 MG tablet Take 1 tablet by mouth every 6 (six) hours as needed for moderate pain (pain score 4-6). 20 tablet 0   isosorbide  mononitrate (IMDUR ) 60 MG 24 hr tablet Take 1 tablet (60 mg total) by mouth daily. 90 tablet 3   metoprolol  succinate (TOPROL -XL) 25 MG 24 hr tablet TAKE 1/2 TABLET(12.5 MG) BY MOUTH DAILY 45 tablet 2   nitroGLYCERIN  (NITROSTAT ) 0.4 MG SL tablet PLACE 1 TABLET UNDER THE TONGUE EVERY 5 MINUTES AS NEEDED FOR CHEST PAIN 25 tablet 3   pantoprazole  (PROTONIX ) 40 MG  tablet TAKE 1 TABLET(40 MG) BY MOUTH EVERY DAY 90 tablet 3   No current facility-administered medications for this visit.    Musculoskeletal: Strength & Muscle Tone: within normal limits Gait & Station: normal Patient leans: N/A  Psychiatric Specialty Exam:  Psychiatric Specialty Exam: Blood pressure 125/75, pulse 68, height 5' 4 (1.626 m), weight 144 lb (65.3 kg), last menstrual period 09/27/2003.Body mass index is 24.72 kg/m. Review of Systems  General Appearance: Casual  Eye Contact:  Good  Speech:  Clear and Coherent  Volume:  Normal  Mood:  Euthymic  Affect:  Appropriate  Thought Content: Logical   Suicidal Thoughts:  No  Homicidal Thoughts:  No  Thought Process:  Goal Directed  Orientation:  Full (Time, Place, and Person)    Memory: Immediate;   Good Recent;   Good Remote;   Good  Judgment:  Intact  Insight:  Good  Concentration:  Concentration: Good and Attention Span: Good  Recall:  not formally assessed   Fund of Knowledge: Good  Language: Good  Psychomotor Activity:  Normal  Akathisia:  No  AIMS (if indicated): not done  Assets:  Communication Skills Desire for Improvement Financial Resources/Insurance Housing Resilience Transportation  ADL's:  Intact  Cognition: WNL  Sleep:  Fair    Screenings: GAD-7    Flowsheet Row [facility]  Total GAD-7 Score 16   PHQ2-9    Flowsheet Row Office Visit from 07/17/2024 in [facility] Office Visit from 06/20/2023 in [facility] [place] [employer] Visit from 01/10/2023 in [facility] [place] CARDIAC REHAB PHASE II EXERCISE from 12/22/2021 in [facility] CARDIAC REHAB PHASE II ORIENTATION from 09/28/2021 in [facility]  PHQ-2 Total Score  6 0 0 0 0  PHQ-9 Total Score 20 -- -- -- 0   Flowsheet Row Admission (Discharged) from 08/01/2024 in [facility] ED from 07/20/2024 in [facility] Emergency Department at [facility] Admission (Discharged) from 03/27/2024 in [facility] CARDIAC CATH LAB  C-SSRS RISK CATEGORY No Risk No Risk No Risk     Collaboration of Care: Other Dr. Carvin  Patient/Guardian was advised Release of Information must be obtained prior to any record release in order to collaborate their care with an outside provider. Patient/Guardian was advised if they have not already done so to contact the registration department to sign all necessary forms in order for us  to release information regarding their care.   Consent: Patient/Guardian gives verbal consent for treatment and assignment of benefits for services provided during this visit. Patient/Guardian expressed understanding and agreed to proceed.   Callan Yontz, MD 12/15/20252:02 PM

## 2024-08-29 ENCOUNTER — Other Ambulatory Visit (HOSPITAL_COMMUNITY): Payer: Self-pay

## 2024-08-29 DIAGNOSIS — F324 Major depressive disorder, single episode, in partial remission: Secondary | ICD-10-CM

## 2024-08-29 DIAGNOSIS — F419 Anxiety disorder, unspecified: Secondary | ICD-10-CM

## 2024-08-30 ENCOUNTER — Other Ambulatory Visit (HOSPITAL_COMMUNITY): Payer: Self-pay

## 2024-08-30 DIAGNOSIS — F324 Major depressive disorder, single episode, in partial remission: Secondary | ICD-10-CM

## 2024-08-30 DIAGNOSIS — F419 Anxiety disorder, unspecified: Secondary | ICD-10-CM

## 2024-08-30 MED ORDER — FLUOXETINE HCL 40 MG PO CAPS: 40.0000 mg | ORAL_CAPSULE | Freq: Every day | ORAL | 0 refills | Status: AC

## 2024-09-09 ENCOUNTER — Ambulatory Visit (HOSPITAL_COMMUNITY)

## 2024-09-09 DIAGNOSIS — F324 Major depressive disorder, single episode, in partial remission: Secondary | ICD-10-CM

## 2024-09-09 DIAGNOSIS — F419 Anxiety disorder, unspecified: Secondary | ICD-10-CM

## 2024-09-09 MED ORDER — FLUOXETINE HCL 40 MG PO CAPS: 40.0000 mg | ORAL_CAPSULE | Freq: Every day | ORAL | 0 refills | Status: DC

## 2024-09-09 NOTE — Addendum Note (Signed)
 Addended by: CARVIN CROCK on: 09/09/2024 02:11 PM   Modules accepted: Level of Service

## 2024-10-09 ENCOUNTER — Ambulatory Visit (HOSPITAL_COMMUNITY): Admitting: Clinical

## 2024-10-09 NOTE — Progress Notes (Signed)
 " Psychiatric Follow Up  Patient Identification: Marissa Lee MRN:  [id number] Date of Evaluation:  10/14/2024 Referral Source: PCP Chief Complaint:   Chief Complaint  Patient presents with   Follow-up   Medication Refill   Visit Diagnosis:    ICD-10-CM   1. Anxiety  F41.9 FLUoxetine  (PROZAC ) 40 MG capsule    2. Major depressive disorder in partial remission, unspecified whether recurrent  F32.4 FLUoxetine  (PROZAC ) 40 MG capsule        Assessment:  Marissa Lee is a 74 y.o. female with a history of MDD on Prozac  who presents in person to [facility] at [facility] for follow up on 10/14/2024.   Patient has remained at her baseline, has had no recent exacerbation of depression or anxiety symptoms.  She does have ongoing interpersonal conflicts with her son-in-law that impacts her mood however she has been trying to use her coping strategies including journaling and reading.  She is also been trying to have a little more social life by going out with her friends, encouraged her to keep up with that.  She has not been able to place firm boundaries and would strictly benefit from therapy, has a therapy appointment coming up next month, encouraged her to keep up with that.  She has been eating and sleeping well and is not actively suicidal or suicidal.  No recent health concerns after a fall and no recent changes to her medications as well.  She does not use any substances including alcohol or cigarettes.  Lack of social life due to the responsibilities of taking care of grandkids has impacted her mood.  We utilized some motivational counseling today, encouraged her to draw firm boundaries, follow a strict sleep schedule and placing herself first.  Will continue the same medication management clinic in 6 weeks.  Risk Assessment: An assessment of suicide and violence risk factors was performed as part of this evaluation and is not significantly changed from the last  visit. While future psychiatric events cannot be accurately predicted, the patient does not currently require acute inpatient psychiatric care and does not currently meet [place]  involuntary commitment criteria. Patient was given contact information for crisis resources, behavioral health clinic and was instructed to call 911 for emergencies.    Plan: # MDD in partial remission Past medication trials: Prozac  Status of problem: Current Interventions: -- Continue Prozac  40 mg daily -- Start psychotherapy as outpatient, patient has appointment scheduled in February  # GAD Past medication trials: Prozac  Status of problem: Current Interventions: -- Continue Prozac  40 mg daily -- Start psychotherapy as outpatient, patient has appointment scheduled in February   History of Present Illness:    Marissa Lee is a 74 year old female with history of MDD that presented to the clinic today for follow up. She reported her mood as positive today .  She denied any active or passive SI/HI/AVH.  Reported that she has been going to physical therapy after a fall and has been healing well.  Also reported that she recently had a fall and bruised her knee.  She denied any acute pain today.  Reported that she was in [place] and got flu.  Also stated that the little 1 is with me, I am helping with school .  Stated that her eldest granddaughter is in seventh grade she is upset that she cannot go back to the same school, she is a equities trader .  Stated that she has been having conflicts with her son-in-law on 1  hand is saying that on the other hand they are looking for houses .  When asked about sleep she stated too much , and appetite well for me .  When asked about depression and anxiety she stated comes and goes , but related that to conflicts with her son-in-law.  Also reported that she went on a party on Friday night with her friends, encouraged her to have more frequent get-togethers with her  friends, she was amenable.  Also stated that I need a life, I do not have a life.  Discussed about setting boundaries.  Reported that she is starting therapy next month, encouraged her to keep up with therapy appointments.  Also stated that she has been writing, reading as a coping strategies, does not cook and has been eating most of the frozen foods. Reported that Prozac  has been helpful but also stated that I feel no better or worse .  Recommended her to continue the same medications, discussed compliance, risks and benefits of her medications.  Utilized motivational counseling today.  Will have her back in the clinic in 6 weeks.  Associated Signs/Symptoms: Depression Symptoms:  depressed mood, fatigue, feelings of worthlessness/guilt, difficulty concentrating, hopelessness, anxiety, (Hypo) Manic Symptoms:  None Anxiety Symptoms:  Excessive Worry, Psychotic Symptoms:  None PTSD Symptoms: Negative  Past Psychiatric History:  Past psychiatric diagnoses: MDD Psychiatric hospitalizations:Denies Past suicide attempts: Denies Hx of self harm: Denies Hx of violence towards others: Denies Prior psychiatric providers: Denies Prior therapy: Denies Access to firearms: Denies  Prior medication trials: Prozac  10 mg  Substance use: Denies  Past Medical History:  Past Medical History:  Diagnosis Date   Anxiety    CAD (coronary artery disease), autologous vein bypass graft    Endometriosis    Fibrocystic breast    muliple drainage   Fibroid    GERD (gastroesophageal reflux disease)    Hiatal hernia    History of bronchitis    Hypercholesteremia 2007   Melanoma of toe, 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/2022   Osteopenia 08/2016   hip and spine   Postmenopausal state 06/05/2013   Subclinical hypothyroidism 10/04/2018   Urge incontinence 06/05/2013    Past Surgical History:  Procedure Laterality Date   ABDOMINAL HYSTERECTOMY     BLADDER SUSPENSION  2004   done with [facility]   BREAST BIOPSY  1980    done 3x   CLOSED REDUCTION METACARPAL WITH PERCUTANEOUS PINNING Right 08/24/2016   Procedure: CLOSED REDUCTION  WITH PERCUTANEOUS PINNING;  Surgeon: Prentice Pagan, MD;  Location: [facility] OR;  Service: Orthopedics;  Laterality: Right;   COLONOSCOPY     COMBINED HYSTERECTOMY VAGINAL / OOPHORECTOMY / A&P REPAIR  2004   CORONARY ARTERY BYPASS GRAFT N/A 07/28/2021   Procedure: CORONARY ARTERY BYPASS GRAFTING (CABG) times two using left internal mammary artery and  left leg saphenous vein;  Surgeon: Kerrin Elspeth BROCKS, MD;  Location: [facility] OR;  Service: Open Heart Surgery;  Laterality: N/A;   FOOT SURGERY Right 1992   IABP INSERTION Right 07/28/2021   Procedure: IABP Insertion;  Surgeon: Anner Alm ORN, MD;  Location: [facility] INVASIVE CV LAB;  Service: Cardiovascular;  Laterality: Right;   LEFT HEART CATH AND CORONARY ANGIOGRAPHY N/A 07/28/2021   Procedure: LEFT HEART CATH AND CORONARY ANGIOGRAPHY;  Surgeon: Anner Alm ORN, MD;  Location: [facility] INVASIVE CV LAB;  Service: Cardiovascular;  Laterality: N/A;   LEFT HEART CATH AND CORS/GRAFTS ANGIOGRAPHY N/A 03/27/2024   Procedure: LEFT HEART CATH AND CORS/GRAFTS ANGIOGRAPHY;  Surgeon: Ladona Heinz, MD;  Location: [facility] INVASIVE CV LAB;  Service: Cardiovascular;  Laterality: N/A;   OPEN REDUCTION INTERNAL FIXATION (ORIF) DISTAL RADIAL FRACTURE Left 08/01/2024   Procedure: OPEN REDUCTION INTERNAL FIXATION (ORIF) DISTAL RADIUS FRACTURE;  Surgeon: Murrell Drivers, MD;  Location: [facility];  Service: Orthopedics;  Laterality: Left;   OPEN REDUCTION INTERNAL FIXATION (ORIF) HAND Right 08/24/2016   Procedure: RIGHT HAND LONG RING AND SMALL FINGER  POSSIBLE ORIF;  Surgeon: Prentice Pagan, MD;  Location: [facility] OR;  Service: Orthopedics;  Laterality: Right;   WISDOM TOOTH EXTRACTION  1978    Family Psychiatric History: Nothing significant  Family History:  Family History  Problem Relation Age of Onset   Heart disease Mother        valvular disease, valve replacement    Atrial fibrillation Mother    Stroke Mother    Heart disease Brother        valve disease, valve surgery   Thyroid  disease Maternal Grandmother        Goiter   Colon cancer Paternal Grandmother    Cancer Paternal Grandmother        Colon    Cancer Paternal Grandfather        Lung   Thyroid  disease Daughter        Hypothyroid   Esophageal cancer Neg Hx    Liver disease Neg Hx    Rectal cancer Neg Hx    Stomach cancer Neg Hx     Social History:   Social History   Socioeconomic History   Marital status: Single    Spouse name: Not on file   Number of children: 2   Years of education: Not on file   Highest education level: Not on file  Occupational History   Occupation: retire  Tobacco Use   Smoking status: Former    Current packs/day: 0.00    Average packs/day: 1 pack/day for 20.0 years (20.0 ttl pk-yrs)    Types: Cigarettes    Start date: 74    Quit date: 2000    Years since quitting: 26.0   Smokeless tobacco: Never   Tobacco comments:    quit in approximately 2000  Vaping Use   Vaping status: Never Used  Substance and Sexual Activity   Alcohol use: Yes    Alcohol/week: 3.0 - 4.0 standard drinks of alcohol    Types: 2 Cans of beer, 1 - 2 Standard drinks or equivalent per week    Comment: rarely   Drug use: No   Sexual activity: Not Currently    Partners: Male    Birth control/protection: Surgical    Comment: Hyst  Other Topics Concern   Not on file  Social History Narrative   Not on file   Social Drivers of Health   Tobacco Use: Medium Risk (10/14/2024)   Patient History    Smoking Tobacco Use: Former    Smokeless Tobacco Use: Never    Passive Exposure: Not on Actuary Strain: Not on file  Food Insecurity: Low Risk (08/09/2024)   Received from [facility]   Epic    Within the past 12 months, you worried that your food would run out before you got money to buy more: Never true    Within the past 12 months, the food you bought just  didn't last and you didn't have money to get more. : Never true  Transportation Needs: No Transportation Needs (08/09/2024)   Received from [facility]  In the past 12 months, has lack of reliable transportation kept you from medical appointments, meetings, work or from getting things needed for daily living? : No  Physical Activity: Not on file  Stress: Not on file  Social Connections: Not on file  Depression (PHQ2-9): High Risk (07/17/2024)   Depression (PHQ2-9)    PHQ-2 Score: 20  Alcohol Screen: Not on file  Housing: Low Risk (08/09/2024)   Received from [facility]   Epic    What is your living situation today?: I have a steady place to live    Think about the place you live. Do you have problems with any of the following? Choose all that apply:: None/None on this list  Utilities: Low Risk (08/09/2024)   Received from [facility]   Utilities    In the past 12 months has the electric, gas, oil, or water company threatened to shut off services in your home? : No  Health Literacy: Not on file    Additional Social History: Patient was a editor, commissioning, retired 13 years ago.  Allergies:   Allergies  Allergen Reactions   Cephalosporins Anaphylaxis and Swelling    Throat swells   Clindamycin /Lincomycin     Metallic taste, blisters behind ears   Codeine Itching   Lisinopril  Cough   Diflucan  [Fluconazole ] Rash   Promethazine  Rash    Metabolic Disorder Labs: Lab Results  Component Value Date   HGBA1C 5.5 06/20/2023   MPG 102.54 07/28/2021   No results found for: PROLACTIN Lab Results  Component Value Date   CHOL 156 03/18/2024   TRIG 130 03/18/2024   HDL 56 03/18/2024   CHOLHDL 2.8 03/18/2024   VLDL 82.9 05/05/2022   LDLCALC 77 03/18/2024   LDLCALC 88 05/05/2022   Lab Results  Component Value Date   TSH 4.75 05/03/2023    Therapeutic Level Labs: No results found for: LITHIUM No results found for: CBMZ No results found for:  VALPROATE  Current Medications: Current Outpatient Medications  Medication Sig Dispense Refill   amLODipine  (NORVASC ) 5 MG tablet TAKE 1 TABLET(5 MG) BY MOUTH DAILY 90 tablet 2   Apoaequorin (PREVAGEN PO) Take 1 tablet by mouth daily at 2 am.     aspirin  EC 81 MG tablet Take 81 mg by mouth daily. Swallow whole.     atorvastatin  (LIPITOR ) 40 MG tablet Take 1 tablet (40 mg total) by mouth daily. 90 tablet 3   azelastine  (ASTELIN ) 0.1 % nasal spray Place 2 sprays into both nostrils 2 (two) times daily. Use in each nostril as directed (Patient taking differently: Place 2 sprays into both nostrils 2 (two) times daily as needed. Use in each nostril as directed) 30 mL 12   Calcium -Phosphorus-Vitamin D  (CITRACAL +D3 PO) Take 1 tablet by mouth daily. Gummy     clopidogrel  (PLAVIX ) 75 MG tablet TAKE 1 TABLET(75 MG) BY MOUTH DAILY 90 tablet 3   Dextrose , Diabetic Use, (GLUCOSE PO) Take 1 tablet by mouth daily as needed (hypoglycemic episodes).     FLUoxetine  (PROZAC ) 40 MG capsule Take 1 capsule (40 mg total) by mouth daily. 90 capsule 0   HYDROcodone -acetaminophen  (NORCO/VICODIN) 5-325 MG tablet Take 1 tablet by mouth every 6 (six) hours as needed for moderate pain (pain score 4-6). 20 tablet 0   isosorbide  mononitrate (IMDUR ) 60 MG 24 hr tablet Take 1 tablet (60 mg total) by mouth daily. 90 tablet 3   metoprolol  succinate (TOPROL -XL) 25 MG 24 hr tablet TAKE 1/2 TABLET(12.5 MG) BY MOUTH  DAILY 45 tablet 2   nitroGLYCERIN  (NITROSTAT ) 0.4 MG SL tablet PLACE 1 TABLET UNDER THE TONGUE EVERY 5 MINUTES AS NEEDED FOR CHEST PAIN 25 tablet 3   pantoprazole  (PROTONIX ) 40 MG tablet TAKE 1 TABLET(40 MG) BY MOUTH EVERY DAY 90 tablet 3   No current facility-administered medications for this visit.    Musculoskeletal: Strength & Muscle Tone: within normal limits Gait & Station: normal Patient leans: N/A  Psychiatric Specialty Exam:  Psychiatric Specialty Exam: Blood pressure 123/75, pulse 84, height 5' 4  (1.626 m), weight 144 lb (65.3 kg), last menstrual period 09/27/2003.Body mass index is 24.72 kg/m. Review of Systems  General Appearance: Casual  Eye Contact:  Good  Speech:  Clear and Coherent  Volume:  Normal  Mood:  Euthymic  Affect:  Appropriate  Thought Content: Logical   Suicidal Thoughts:  No  Homicidal Thoughts:  No  Thought Process:  Goal Directed  Orientation:  Full (Time, Place, and Person)    Memory: Immediate;   Good Recent;   Good Remote;   Good  Judgment:  Intact  Insight:  Good  Concentration:  Concentration: Good and Attention Span: Good  Recall:  not formally assessed   Fund of Knowledge: Good  Language: Good  Psychomotor Activity:  Normal  Akathisia:  No  AIMS (if indicated): not done  Assets:  Communication Skills Desire for Improvement Financial Resources/Insurance Housing Resilience Transportation  ADL's:  Intact  Cognition: WNL  Sleep:  Fair    Screenings: GAD-7    Flowsheet Row [facility]  Total GAD-7 Score 16   PHQ2-9    Flowsheet Row Office Visit from 07/17/2024 in [facility] Office Visit from 06/20/2023 in [facility] [employer] Visit from 01/10/2023 in [facility] [place] CARDIAC REHAB PHASE II EXERCISE from 12/22/2021 in [facility] CARDIAC REHAB PHASE II ORIENTATION from 09/28/2021 in [facility]  PHQ-2 Total Score 6 0 0 0 0  PHQ-9 Total Score 20 -- -- -- 0   Flowsheet Row Admission (Discharged) from 08/01/2024 in [facility] ED from 07/20/2024 in [facility] Department at [facility] Admission (Discharged) from 03/27/2024 in [facility] CARDIAC CATH LAB  C-SSRS RISK CATEGORY No Risk No Risk No Risk     Collaboration  of Care: Other Dr. Carvin  Patient/Guardian was advised Release of Information must be obtained prior to any record release in order to collaborate their care with an outside provider. Patient/Guardian was advised if they have not already done so to contact the registration department to sign all necessary forms in order for us  to release information regarding their care.   Consent: Patient/Guardian gives verbal consent for treatment and assignment of benefits for services provided during this visit. Patient/Guardian expressed understanding and agreed to proceed.   Shelia Kingsberry, MD 1/19/202611:54 AM "

## 2024-10-10 ENCOUNTER — Ambulatory Visit (HOSPITAL_COMMUNITY): Admitting: Clinical

## 2024-10-14 ENCOUNTER — Encounter (HOSPITAL_COMMUNITY): Payer: Self-pay

## 2024-10-14 ENCOUNTER — Ambulatory Visit (HOSPITAL_COMMUNITY)

## 2024-10-14 DIAGNOSIS — F324 Major depressive disorder, single episode, in partial remission: Secondary | ICD-10-CM

## 2024-10-14 DIAGNOSIS — F419 Anxiety disorder, unspecified: Secondary | ICD-10-CM

## 2024-10-14 MED ORDER — FLUOXETINE HCL 40 MG PO CAPS: 40.0000 mg | ORAL_CAPSULE | Freq: Every day | ORAL | 0 refills | Status: AC

## 2024-10-15 NOTE — Addendum Note (Signed)
 Addended by: CARVIN CROCK on: 10/15/2024 11:35 AM   Modules accepted: Level of Service

## 2024-10-24 ENCOUNTER — Ambulatory Visit (HOSPITAL_COMMUNITY): Admitting: Clinical

## 2024-10-24 ENCOUNTER — Other Ambulatory Visit: Payer: Self-pay

## 2024-10-24 MED ORDER — METOPROLOL SUCCINATE ER 25 MG PO TB24: 12.5000 mg | ORAL_TABLET | Freq: Every day | ORAL | 2 refills | Status: AC

## 2024-10-26 ENCOUNTER — Other Ambulatory Visit: Payer: Self-pay | Admitting: Cardiology

## 2024-10-26 DIAGNOSIS — I25118 Atherosclerotic heart disease of native coronary artery with other forms of angina pectoris: Secondary | ICD-10-CM

## 2024-10-29 ENCOUNTER — Telehealth: Payer: Self-pay | Admitting: Cardiology

## 2024-10-29 MED ORDER — ISOSORBIDE MONONITRATE ER 60 MG PO TB24: 60.0000 mg | ORAL_TABLET | Freq: Every day | ORAL | 0 refills | Status: AC

## 2024-10-29 NOTE — Telephone Encounter (Signed)
" °*  STAT* If patient is at the pharmacy, call can be transferred to refill team.   1. Which medications need to be refilled? (please list name of each medication and dose if known)   isosorbide  mononitrate (IMDUR ) 60 MG 24 hr tablet     2. Would you like to learn more about the convenience, safety, & potential cost savings by using the [facility]? No        3. Are you open to using the [facility] (Type [facility].  ). no   4. Which pharmacy/location (including street and city if local pharmacy) is medication to be sent to?[id number] - [place], [place] - [address]    5. Do they need a 30 day or 90 day supply? 90 day   Pt is out of medication and has upcoming appt Feb  "

## 2024-10-29 NOTE — Telephone Encounter (Signed)
 Refill sent

## 2024-11-01 ENCOUNTER — Ambulatory Visit: Admitting: Cardiology

## 2024-11-01 ENCOUNTER — Encounter: Payer: Self-pay | Admitting: Cardiology

## 2024-11-01 ENCOUNTER — Other Ambulatory Visit (HOSPITAL_COMMUNITY): Payer: Self-pay

## 2024-11-01 VITALS — BP 124/56 | HR 58 | Ht 64.0 in | Wt 140.0 lb

## 2024-11-01 DIAGNOSIS — E782 Mixed hyperlipidemia: Secondary | ICD-10-CM

## 2024-11-01 DIAGNOSIS — I2510 Atherosclerotic heart disease of native coronary artery without angina pectoris: Secondary | ICD-10-CM

## 2024-11-01 MED ORDER — NITROGLYCERIN 0.4 MG SL SUBL
0.4000 mg | SUBLINGUAL_TABLET | SUBLINGUAL | 3 refills | Status: AC | PRN
  Filled 2024-11-01: qty 25, 10d supply, fill #0

## 2024-11-01 MED ORDER — ASPIRIN 81 MG PO TBEC: 81.0000 mg | DELAYED_RELEASE_TABLET | Freq: Every day | ORAL | Status: DC

## 2024-11-01 MED ORDER — EZETIMIBE 10 MG PO TABS
10.0000 mg | ORAL_TABLET | Freq: Every day | ORAL | 3 refills | Status: AC
  Filled 2024-11-01: qty 90, 90d supply, fill #0

## 2024-11-01 NOTE — Progress Notes (Signed)
 " Cardiology Office Note:  .   Date:  11/01/2024  ID:  Marissa Lee, DOB 1951/09/07, MRN [id number] PCP: Allwardt, Marissa HERO, PA-C  [facility] Providers Cardiologist:  Gordy Bergamo, MD   History of Present Illness: .   Marissa Lee is a 74 y.o. Caucasian female patient coronary disease SP CABG on 07/28/2021 for critical left main disease when she presented with substernal chest pain, hypercholesterolemia presents for follow-up of CAD, has had recurrent episodes of chest pain.  Patient has had significant social stressors.  In view of recurrent chest pain although negative nuclear stress test in 2014, had cardiac catheterization on 7-25 revealing widely patent grafts, LIMA to LAD and SVG to OM.  This is a 42-month office visit.  Her daughter is now settled as her husband and she are back together. [name] herself is doing well.     Discussed the use of AI scribe software for clinical note transcription with the patient, who gave verbal consent to proceed.  History of Present Illness She has sharp chest pain at rest, occasionally radiating to the shoulder, without clear triggers. She notes markedly decreased activity since October after a fall that caused a wrist fracture, which has made her more sedentary and hesitant to go outside, especially in snowy conditions.  She has coronary artery disease and takes clopidogrel . Her blood pressure is treated with amlodipine  5 mg once daily and metoprolol  succinate 12.5 mg (half tablet) daily.  She is not using opioid pain medications and plans to increase activity as her wrist heals.  Cardiac Studies relevent.    Cardiac Catheterization 03/27/24: LIMA to LAD: Widely patent except at the insertion site there is a 30 to 40% stenosis. SVG to OM1: Widely patent.      Echocardiogram 07/28/2021: Normal LV systolic function.  No significant valvular abnormality.  EKG:      Labs   Lab Results  Component Value Date   CHOL 156 03/18/2024   HDL 56  03/18/2024   LDLCALC 77 03/18/2024   TRIG 130 03/18/2024   CHOLHDL 2.8 03/18/2024   No results found for: LIPOA  Recent Labs    03/13/24 1137  NA 136  K 3.6  CL 105  CO2 25  GLUCOSE 79  BUN 13  CREATININE 1.01*  CALCIUM  8.6*  GFRNONAA 60*    Lab Results  Component Value Date   ALT 18 06/20/2023   AST 20 06/20/2023   ALKPHOS 51 06/20/2023   BILITOT 0.6 06/20/2023      Latest Ref Rng & Units 03/13/2024   11:37 AM 06/20/2023    1:51 PM 05/03/2023   11:34 AM  CBC  WBC 4.0 - 10.5 K/uL 6.3  5.8  7.1   Hemoglobin 12.0 - 15.0 g/dL 86.7  86.1  86.0   Hematocrit 36.0 - 46.0 % 41.2  42.7  42.9   Platelets 150 - 400 K/uL 291  340.0  415.0    Lab Results  Component Value Date   HGBA1C 5.5 06/20/2023    Lab Results  Component Value Date   TSH 4.75 05/03/2023   ROS  Review of Systems  Cardiovascular:  Negative for chest pain, dyspnea on exertion and leg swelling.   Physical Exam:   VS:  BP (!) 124/56 (BP Location: Left Arm, Patient Position: Sitting, Cuff Size: Normal)   Pulse (!) 58   Ht 5' 4 (1.626 m)   Wt 140 lb (63.5 kg)   LMP 09/27/2003   SpO2 97%  BMI 24.03 kg/m    Wt Readings from Last 3 Encounters:  11/01/24 140 lb (63.5 kg)  08/01/24 143 lb 8.3 oz (65.1 kg)  07/20/24 138 lb 14.2 oz (63 kg)    BP Readings from Last 3 Encounters:  11/01/24 (!) 124/56  08/01/24 (!) 124/59  07/20/24 (!) 136/59   Physical Exam Neck:     Vascular: No carotid bruit or JVD.  Cardiovascular:     Rate and Rhythm: Normal rate and regular rhythm.     Pulses: Intact distal pulses.     Heart sounds: Normal heart sounds. No murmur heard.    No gallop.  Pulmonary:     Effort: Pulmonary effort is normal.     Breath sounds: Normal breath sounds.  Abdominal:     General: Bowel sounds are normal.     Palpations: Abdomen is soft.  Musculoskeletal:     Right lower leg: No edema.     Left lower leg: No edema.    ASSESSMENT AND PLAN: .      ICD-10-CM   1. Coronary artery  disease involving native coronary artery of native heart without angina pectoris  I25.10 ezetimibe  (ZETIA ) 10 MG tablet    nitroGLYCERIN  (NITROSTAT ) 0.4 MG SL tablet    DISCONTINUED: aspirin  EC 81 MG tablet    2. Mixed hyperlipidemia  E78.2 ezetimibe  (ZETIA ) 10 MG tablet     Assessment & Plan Coronary artery disease Intermittent sharp chest pain likely due to non-cardiac causes such as chest wall muscle spasms. Blood pressure is well controlled at 124/56 mmHg. Heart rate is stable. No need for aspirin  as she is on clopidogrel  (Plavix ). - Prescribed nitroglycerin  for sublingual use as needed for chest pain. - Continue clopidogrel  (Plavix ) long term.  Mixed hyperlipidemia LDL cholesterol is slightly elevated at 77 mg/dL. Currently on atorvastatin  (Lipitor ) 40 mg. Plan to add ezetimibe  (Zetia ) to further lower cholesterol levels. - Prescribed ezetimibe  (Zetia ) 10 mg once daily in addition to atorvastatin  (Lipitor ).  Hypertension Blood pressure is well controlled with current medication regimen. Current medications include amlodipine  5 mg once daily and metoprolol  succinate 12.5 mg (half tablet) once daily. - Continue current antihypertensive regimen with amlodipine  and metoprolol  succinate.  Follow up: 1 Year  Signed,  Gordy Bergamo, MD, [facility] 11/01/2024, 1:51 PM [facility] [address] [place], [place] [address] Phone: [phone]. Fax:  [phone]  "

## 2024-11-01 NOTE — Patient Instructions (Addendum)
 Medication Instructions:  Meds ordered this encounter  Medications   DISCONTD: aspirin  EC 81 MG tablet    Sig: Take 1 tablet (81 mg total) by mouth daily. Swallow whole.   ezetimibe  (ZETIA ) 10 MG tablet    Sig: Take 1 tablet (10 mg total) by mouth daily.    Dispense:  90 tablet    Refill:  3   nitroGLYCERIN  (NITROSTAT ) 0.4 MG SL tablet    Sig: Place 1 tablet (0.4 mg total) under the tongue every 5 (five) minutes as needed for chest pain.    Dispense:  25 tablet    Refill:  3     *If you need a refill on your cardiac medications before your next appointment, please call your pharmacy*   Follow-Up: At [facility], you and your health needs are our priority.  As part of our continuing mission to provide you with exceptional heart care, our providers are all part of one team.  This team includes your primary Cardiologist (physician) and Advanced Practice Providers or APPs (Physician Assistants and Nurse Practitioners) who all work together to provide you with the care you need, when you need it.  Your next appointment:   1 year(s)  Provider:   Gordy Bergamo, MD            We recommend signing up for the patient portal called MyChart.  Patients are able to view lab/test results, encounter notes, upcoming appointments, etc.  Non-urgent messages can be sent to your provider as well, go to [email or web address].

## 2024-11-06 ENCOUNTER — Ambulatory Visit (HOSPITAL_COMMUNITY)

## 2024-11-25 ENCOUNTER — Ambulatory Visit (HOSPITAL_COMMUNITY)
